# Patient Record
Sex: Male | Born: 1940 | Race: White | Hispanic: No | Marital: Married | Smoking: Former smoker
Health system: Southern US, Community
[De-identification: ages and names within clinical notes are randomized; demographics above are authoritative.]

## PROBLEM LIST (undated history)

## (undated) DIAGNOSIS — K5900 Constipation, unspecified: Secondary | ICD-10-CM

## (undated) DIAGNOSIS — C9110 Chronic lymphocytic leukemia of B-cell type not having achieved remission: Secondary | ICD-10-CM

## (undated) DIAGNOSIS — E119 Type 2 diabetes mellitus without complications: Secondary | ICD-10-CM

## (undated) DIAGNOSIS — I10 Essential (primary) hypertension: Secondary | ICD-10-CM

## (undated) DIAGNOSIS — K5790 Diverticulosis of intestine, part unspecified, without perforation or abscess without bleeding: Secondary | ICD-10-CM

## (undated) DIAGNOSIS — E785 Hyperlipidemia, unspecified: Secondary | ICD-10-CM

## (undated) DIAGNOSIS — I700 Atherosclerosis of aorta: Secondary | ICD-10-CM

## (undated) DIAGNOSIS — N200 Calculus of kidney: Secondary | ICD-10-CM

## (undated) HISTORY — PX: VASECTOMY: SHX75

## (undated) HISTORY — DX: Essential (primary) hypertension: I10

## (undated) HISTORY — PX: SHOULDER ARTHROSCOPY: SHX128

## (undated) HISTORY — DX: Calculus of kidney: N20.0

## (undated) HISTORY — DX: Constipation, unspecified: K59.00

## (undated) HISTORY — DX: Atherosclerosis of aorta: I70.0

## (undated) HISTORY — DX: Type 2 diabetes mellitus without complications: E11.9

## (undated) HISTORY — PX: FINGER FRACTURE SURGERY: SHX638

## (undated) HISTORY — DX: Hyperlipidemia, unspecified: E78.5

## (undated) HISTORY — DX: Diverticulosis of intestine, part unspecified, without perforation or abscess without bleeding: K57.90

## (undated) HISTORY — DX: Chronic lymphocytic leukemia of B-cell type not having achieved remission: C91.10

---

## 1997-12-29 ENCOUNTER — Encounter: Admission: RE | Admit: 1997-12-29 | Discharge: 1998-03-29 | Payer: Self-pay | Admitting: Internal Medicine

## 1999-10-02 ENCOUNTER — Emergency Department (HOSPITAL_COMMUNITY): Admission: EM | Admit: 1999-10-02 | Discharge: 1999-10-02 | Payer: Self-pay

## 2014-04-22 ENCOUNTER — Ambulatory Visit (INDEPENDENT_AMBULATORY_CARE_PROVIDER_SITE_OTHER): Payer: Medicare PPO

## 2014-04-22 ENCOUNTER — Ambulatory Visit (INDEPENDENT_AMBULATORY_CARE_PROVIDER_SITE_OTHER): Payer: Medicare PPO | Admitting: Family Medicine

## 2014-04-22 VITALS — BP 142/80 | HR 93 | Temp 98.0°F | Resp 17 | Ht 68.5 in | Wt 149.0 lb

## 2014-04-22 DIAGNOSIS — R200 Anesthesia of skin: Secondary | ICD-10-CM

## 2014-04-22 DIAGNOSIS — K5732 Diverticulitis of large intestine without perforation or abscess without bleeding: Secondary | ICD-10-CM

## 2014-04-22 DIAGNOSIS — E1165 Type 2 diabetes mellitus with hyperglycemia: Secondary | ICD-10-CM

## 2014-04-22 DIAGNOSIS — R109 Unspecified abdominal pain: Secondary | ICD-10-CM

## 2014-04-22 DIAGNOSIS — Z862 Personal history of diseases of the blood and blood-forming organs and certain disorders involving the immune mechanism: Secondary | ICD-10-CM

## 2014-04-22 DIAGNOSIS — K5900 Constipation, unspecified: Secondary | ICD-10-CM

## 2014-04-22 DIAGNOSIS — H538 Other visual disturbances: Secondary | ICD-10-CM

## 2014-04-22 DIAGNOSIS — R197 Diarrhea, unspecified: Secondary | ICD-10-CM

## 2014-04-22 DIAGNOSIS — Z8639 Personal history of other endocrine, nutritional and metabolic disease: Secondary | ICD-10-CM

## 2014-04-22 DIAGNOSIS — R209 Unspecified disturbances of skin sensation: Secondary | ICD-10-CM

## 2014-04-22 DIAGNOSIS — IMO0001 Reserved for inherently not codable concepts without codable children: Secondary | ICD-10-CM

## 2014-04-22 LAB — POCT URINALYSIS DIPSTICK
Bilirubin, UA: NEGATIVE
Blood, UA: NEGATIVE
Glucose, UA: 100
Ketones, UA: 15
Nitrite, UA: NEGATIVE
Protein, UA: NEGATIVE
Spec Grav, UA: <=1.005
Urobilinogen, UA: 0.2
pH, UA: 5.5

## 2014-04-22 LAB — COMPREHENSIVE METABOLIC PANEL
ALBUMIN: 5 g/dL (ref 3.5–5.2)
ALT: 25 U/L (ref 0–53)
AST: 20 U/L (ref 0–37)
Alkaline Phosphatase: 76 U/L (ref 39–117)
BUN: 14 mg/dL (ref 6–23)
CALCIUM: 9.8 mg/dL (ref 8.4–10.5)
CO2: 25 mEq/L (ref 19–32)
Chloride: 102 mEq/L (ref 96–112)
Creat: 0.96 mg/dL (ref 0.50–1.35)
GLUCOSE: 157 mg/dL — AB (ref 70–99)
POTASSIUM: 5.1 meq/L (ref 3.5–5.3)
Sodium: 140 mEq/L (ref 135–145)
Total Bilirubin: 1.1 mg/dL (ref 0.2–1.2)
Total Protein: 7.7 g/dL (ref 6.0–8.3)

## 2014-04-22 LAB — POCT CBC
Granulocyte percent: 66.3 %G (ref 37–80)
HCT, POC: 45.7 % (ref 43.5–53.7)
Hemoglobin: 14.9 g/dL (ref 14.1–18.1)
Lymph, poc: 3.6 — AB (ref 0.6–3.4)
MCH, POC: 28.5 pg (ref 27–31.2)
MCHC: 32.6 g/dL (ref 31.8–35.4)
MCV: 87.6 fL (ref 80–97)
MID (cbc): 0.7 (ref 0–0.9)
MPV: 7.1 fL (ref 0–99.8)
POC Granulocyte: 8.5 — AB (ref 2–6.9)
POC LYMPH PERCENT: 28.1 %L (ref 10–50)
POC MID %: 5.6 %M (ref 0–12)
Platelet Count, POC: 227 10*3/uL (ref 142–424)
RBC: 5.22 M/uL (ref 4.69–6.13)
RDW, POC: 14.5 %
WBC: 12.8 10*3/uL — AB (ref 4.6–10.2)

## 2014-04-22 LAB — POCT GLYCOSYLATED HEMOGLOBIN (HGB A1C): Hemoglobin A1C: 9.1

## 2014-04-22 LAB — POCT UA - MICROSCOPIC ONLY
Bacteria, U Microscopic: NEGATIVE
Casts, Ur, LPF, POC: NEGATIVE
Crystals, Ur, HPF, POC: NEGATIVE
Epithelial cells, urine per micros: NEGATIVE
Mucus, UA: NEGATIVE
RBC, urine, microscopic: NEGATIVE
Yeast, UA: NEGATIVE

## 2014-04-22 LAB — GLUCOSE, POCT (MANUAL RESULT ENTRY): POC Glucose: 155 mg/dl — AB (ref 70–99)

## 2014-04-22 IMAGING — CR DG ABDOMEN ACUTE W/ 1V CHEST
3 series · 3 of 3 positions shown · non-contrast
Comparison: None.

EXAM:
ACUTE ABDOMEN SERIES (ABDOMEN 2 VIEW & CHEST 1 VIEW)

[PA]
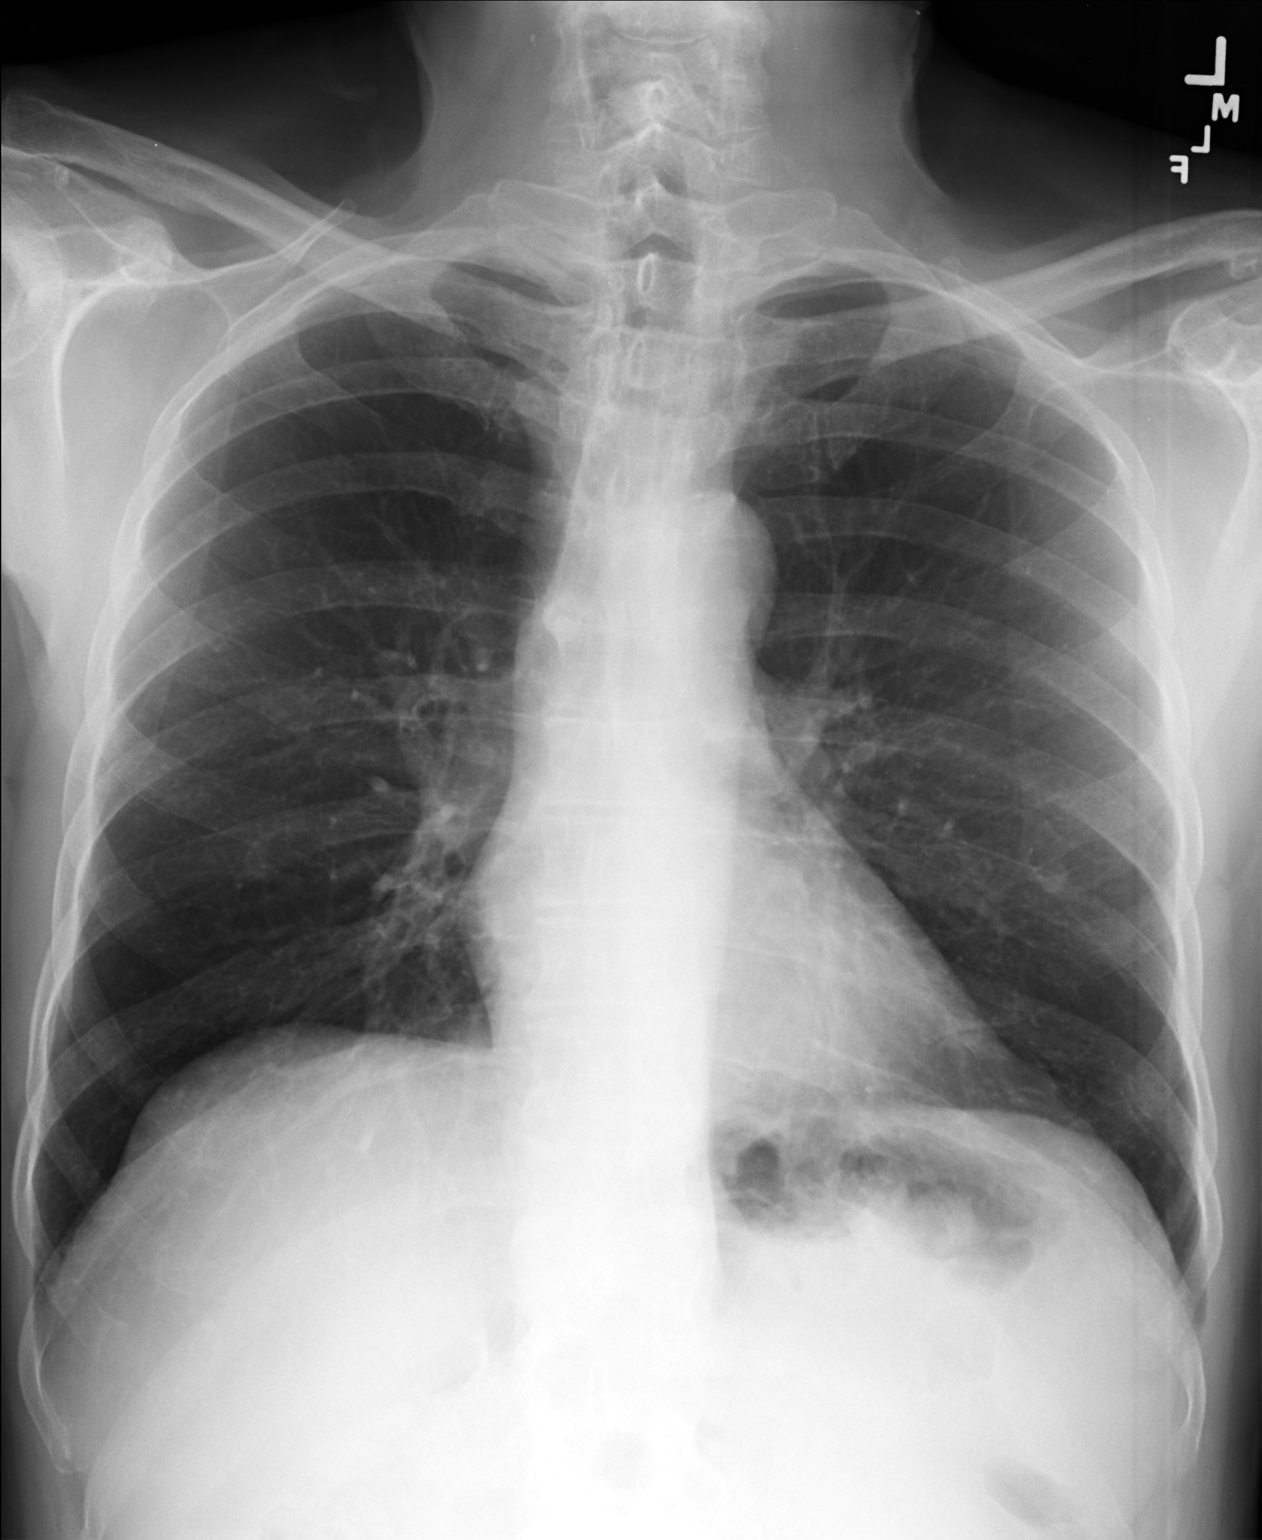

[AP (1 of 2)]
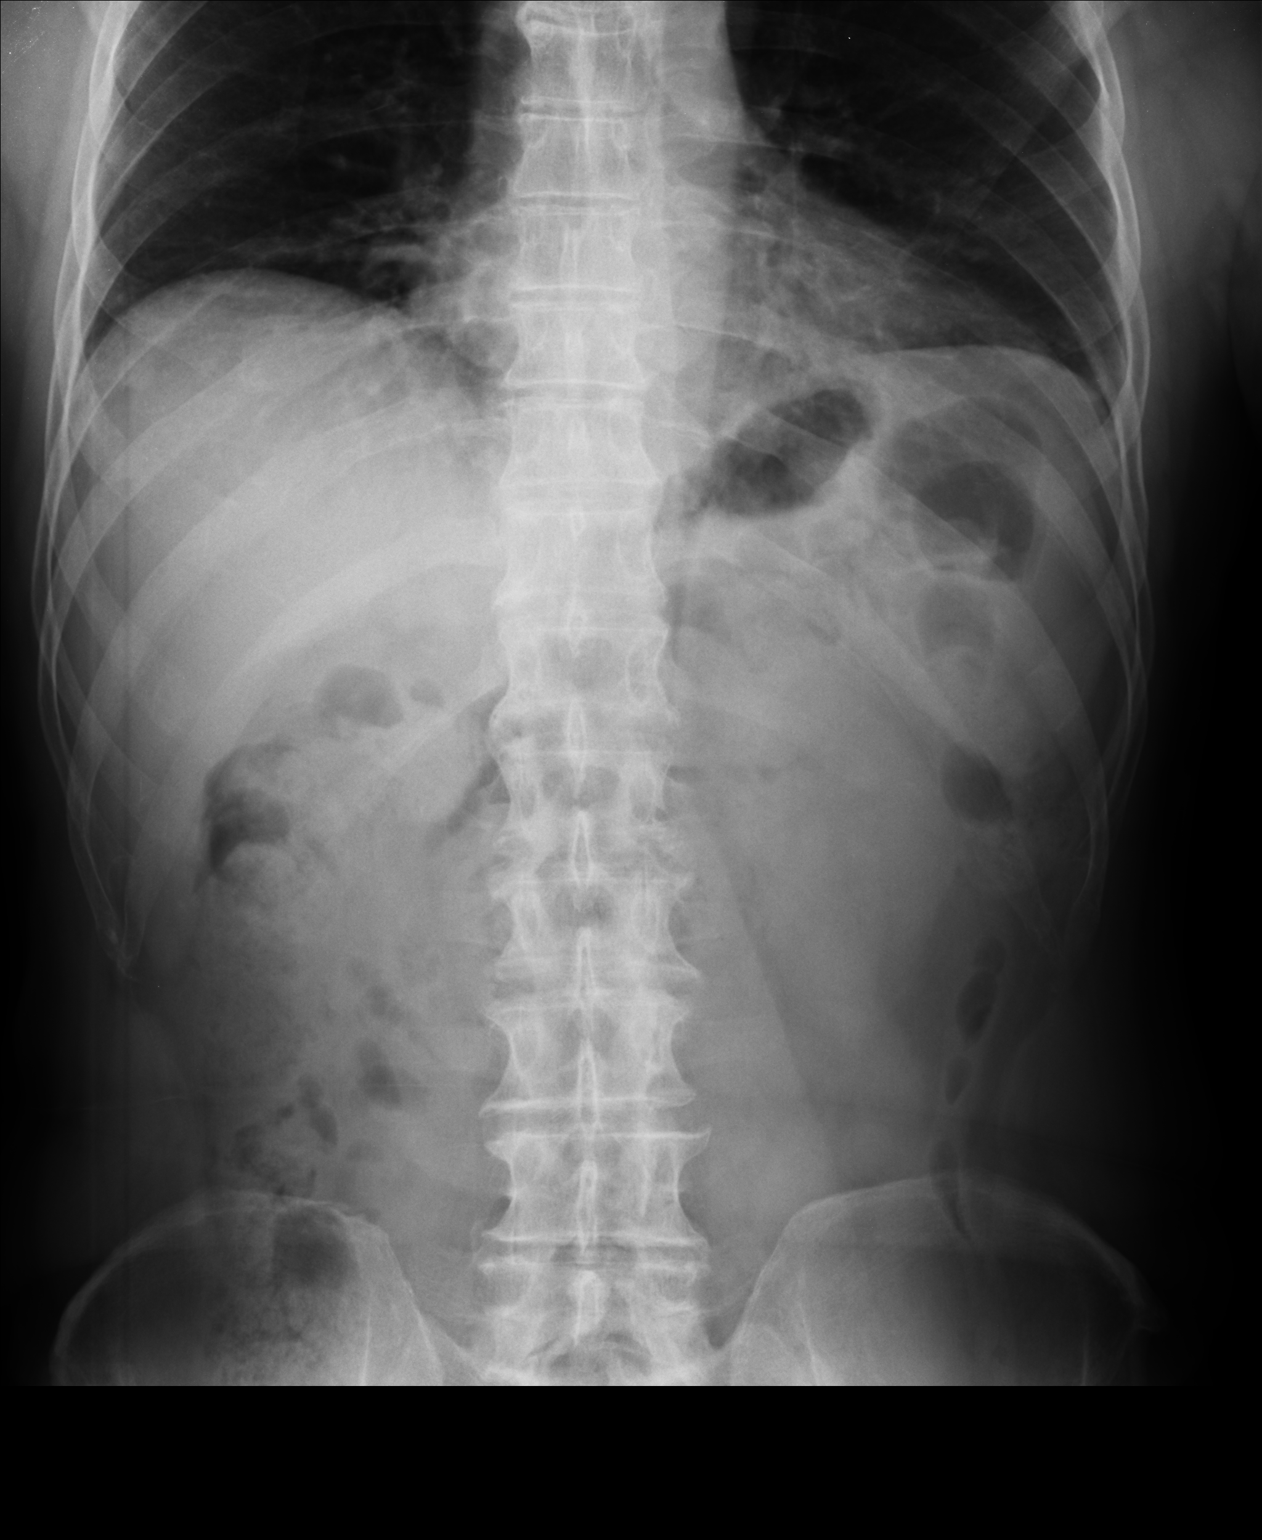

[AP (2 of 2)]
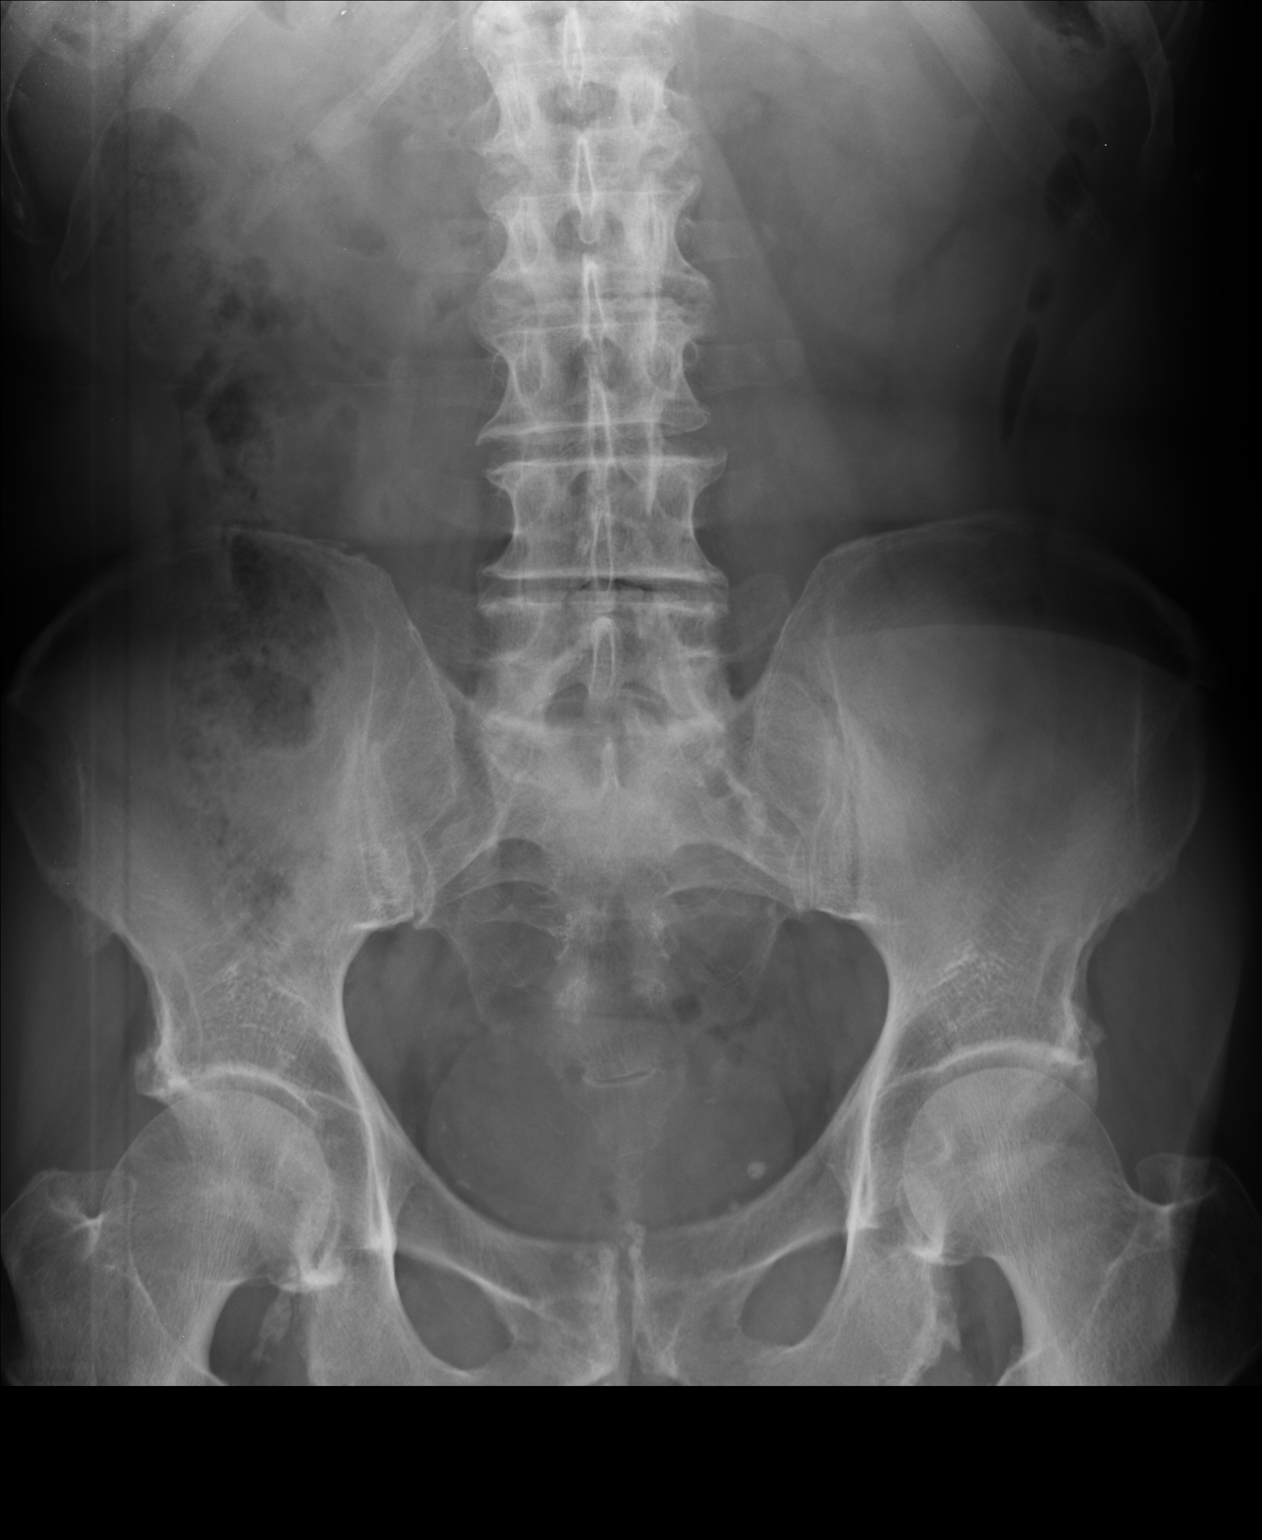

[3 of 3 positions shown; findings below may reference images not displayed]

FINDINGS: There is no evidence of dilated bowel loops or free intraperitoneal
air. No radiopaque calculi or other significant radiographic
abnormality is seen. Heart size and mediastinal contours are within
normal limits. Both lungs are clear.
IMPRESSION: Negative abdominal radiographs.  No acute cardiopulmonary disease.

## 2014-04-22 MED ORDER — BLOOD GLUCOSE METER KIT: PACK | Status: DC

## 2014-04-22 MED ORDER — METFORMIN HCL 500 MG PO TABS: ORAL_TABLET | ORAL | Status: DC

## 2014-04-22 MED ORDER — CIPROFLOXACIN HCL 500 MG PO TABS: 500.0000 mg | ORAL_TABLET | Freq: Two times a day (BID) | ORAL | Status: DC

## 2014-04-22 MED ORDER — METRONIDAZOLE 500 MG PO TABS: 500.0000 mg | ORAL_TABLET | Freq: Three times a day (TID) | ORAL | Status: DC

## 2014-04-22 NOTE — Progress Notes (Signed)
Subjective: 73 year old man who is here for his first time. He has not been to a physician for about 11 years. He has been remarkably healthy. He is a retired Advertising account executive person. He is married. He has been having pains for the past week and he is flanks and abdomen. He has not been vomiting but he has had diarrhea the last few days. It is doing a little bit better now. He continues to hurt and in the flanks however. He is not on any regular medications. Has not been taking any over-the-counter medications. He does not smoke, having quit 30 or 40 years ago. He does not drink alcohol. He is not as active as he was back when he was working. He has not had any significant weight loss. Has not seen any blood in his stool. It is been many years since he had a colonoscopy. He has not had a surgeries on the abdomen. He has had rotator cuff repairs and a finger surgery. He does complain of some tingling and numbness around his lips. He was told many years ago that he might have type 2 diabetes, was placed on some medication but his sugars went too low, and he has never had it rechecked. He has been having some variable blurring of his vision. He has cold feet but no neuropathy symptoms otherwise.  Objective: Healthy-appearing man in no obvious distress. Poor dentition, missing a lot of teeth. His throat is clear. Lips have mild dryness on the periphery of the lips but nothing major. Neck was supple without significant nodes. No thyromegaly. Chest is clear to auscultation. Heart regular without murmurs gallops or arrhythmias. No axillary nodes. Abdomen has normal bowel sounds. Has generalized tenderness across the whole abdomen. He complains of pain in his flanks but did not demonstrate significant CVA tenderness.  Assessment: Abdominal and flank pain Diarrhea, improving Remote history of hyperglycemia, possibly type 2 diabetes Lip numbness  Plan: Abdominal series, CBC,  urinalysis, glucose, hemoglobin A1c, hypertensive metabolic panel  [facility] reading (PRIMARY) by  Dr. Linna Darner Nonspecific abdomen with lots of stool present  Results for orders placed in visit on 04/22/14  POCT UA - MICROSCOPIC ONLY      Result Value Ref Range   WBC, Ur, HPF, POC 1-8     RBC, urine, microscopic neg     Bacteria, U Microscopic neg     Mucus, UA neg     Epithelial cells, urine per micros neg     Crystals, Ur, HPF, POC neg     Casts, Ur, LPF, POC neg     Yeast, UA neg    POCT URINALYSIS DIPSTICK      Result Value Ref Range   Color, UA yellow     Clarity, UA clear     Glucose, UA 100     Bilirubin, UA neg     Ketones, UA 15     Spec Grav, UA <=1.005     Blood, UA neg     pH, UA 5.5     Protein, UA neg     Urobilinogen, UA 0.2     Nitrite, UA neg     Leukocytes, UA Trace    POCT CBC      Result Value Ref Range   WBC 12.8 (*) 4.6 - 10.2 K/uL   Lymph, poc 3.6 (*) 0.6 - 3.4   POC LYMPH PERCENT 28.1  10 - 50 %L   MID (cbc) 0.7  0 -  0.9   POC MID % 5.6  0 - 12 %M   POC Granulocyte 8.5 (*) 2 - 6.9   Granulocyte percent 66.3  37 - 80 %G   RBC 5.22  4.69 - 6.13 M/uL   Hemoglobin 14.9  14.1 - 18.1 g/dL   HCT, POC 45.7  43.5 - 53.7 %   MCV 87.6  80 - 97 fL   MCH, POC 28.5  27 - 31.2 pg   MCHC 32.6  31.8 - 35.4 g/dL   RDW, POC 14.5     Platelet Count, POC 227  142 - 424 K/uL   MPV 7.1  0 - 99.8 fL  GLUCOSE, POCT (MANUAL RESULT ENTRY)      Result Value Ref Range   POC Glucose 155 (*) 70 - 99 mg/dl  POCT GLYCOSYLATED HEMOGLOBIN (HGB A1C)      Result Value Ref Range   Hemoglobin A1C 9.1     .

## 2014-04-22 NOTE — Patient Instructions (Signed)
Take MiraLax one dose daily to gently clean out your bowels. Initially you might try using an over-the-counter Dulcolax suppository one daily for 2 or 3 days also.  Take ciprofloxacin 500 mg one twice daily for possible diverticulitis. Also take metronidazole 500 mg 3 times daily for possible diverticulitis. Both antibiotics are used together to treat diverticulitis. The metronidazole may cause some nausea.  Take metformin 500 mg one daily for diabetes, and after one week increase to twice daily  Return at any time or go to the emergency room if worse, especially if increased pain, vomiting, passing blood, or fevers.  Return in 2 or 3 weeks to recheck the diabetes  The number at the geriatrics practice is: [place]:  [phone]

## 2014-04-24 ENCOUNTER — Encounter: Payer: Self-pay | Admitting: Family Medicine

## 2014-04-24 LAB — URINE CULTURE
Colony Count: NO GROWTH
ORGANISM ID, BACTERIA: NO GROWTH

## 2014-04-25 DIAGNOSIS — R109 Unspecified abdominal pain: Secondary | ICD-10-CM | POA: Diagnosis not present

## 2014-04-25 DIAGNOSIS — D72829 Elevated white blood cell count, unspecified: Secondary | ICD-10-CM | POA: Insufficient documentation

## 2014-04-25 DIAGNOSIS — Z79899 Other long term (current) drug therapy: Secondary | ICD-10-CM | POA: Diagnosis not present

## 2014-04-25 DIAGNOSIS — Z23 Encounter for immunization: Secondary | ICD-10-CM | POA: Insufficient documentation

## 2014-04-25 DIAGNOSIS — M5137 Other intervertebral disc degeneration, lumbosacral region: Secondary | ICD-10-CM | POA: Diagnosis not present

## 2014-04-25 DIAGNOSIS — M51379 Other intervertebral disc degeneration, lumbosacral region without mention of lumbar back pain or lower extremity pain: Secondary | ICD-10-CM | POA: Insufficient documentation

## 2014-04-25 DIAGNOSIS — Z91013 Allergy to seafood: Secondary | ICD-10-CM | POA: Diagnosis not present

## 2014-04-25 DIAGNOSIS — E119 Type 2 diabetes mellitus without complications: Secondary | ICD-10-CM | POA: Diagnosis not present

## 2014-04-25 DIAGNOSIS — K5900 Constipation, unspecified: Secondary | ICD-10-CM | POA: Diagnosis not present

## 2014-04-25 DIAGNOSIS — Z9852 Vasectomy status: Secondary | ICD-10-CM | POA: Insufficient documentation

## 2014-04-26 ENCOUNTER — Emergency Department (HOSPITAL_COMMUNITY): Payer: Medicare HMO

## 2014-04-26 ENCOUNTER — Encounter (HOSPITAL_COMMUNITY): Payer: Self-pay | Admitting: Emergency Medicine

## 2014-04-26 ENCOUNTER — Observation Stay (HOSPITAL_COMMUNITY)
Admission: EM | Admit: 2014-04-26 | Discharge: 2014-04-27 | Disposition: A | Discharge status: Home / Self Care | Payer: Medicare HMO | Attending: Surgery | Admitting: Surgery

## 2014-04-26 DIAGNOSIS — C9110 Chronic lymphocytic leukemia of B-cell type not having achieved remission: Secondary | ICD-10-CM | POA: Diagnosis present

## 2014-04-26 DIAGNOSIS — K5900 Constipation, unspecified: Secondary | ICD-10-CM | POA: Diagnosis present

## 2014-04-26 DIAGNOSIS — R1030 Lower abdominal pain, unspecified: Secondary | ICD-10-CM

## 2014-04-26 DIAGNOSIS — R109 Unspecified abdominal pain: Secondary | ICD-10-CM

## 2014-04-26 LAB — COMPREHENSIVE METABOLIC PANEL
ALBUMIN: 4.5 g/dL (ref 3.5–5.2)
ALT: 26 U/L (ref 0–53)
ANION GAP: 17 — AB (ref 5–15)
AST: 33 U/L (ref 0–37)
Alkaline Phosphatase: 72 U/L (ref 39–117)
BUN: 16 mg/dL (ref 6–23)
CALCIUM: 9.5 mg/dL (ref 8.4–10.5)
CO2: 20 mEq/L (ref 19–32)
CREATININE: 0.96 mg/dL (ref 0.50–1.35)
Chloride: 103 mEq/L (ref 96–112)
GFR calc Af Amer: >90 mL/min (ref >90)
GFR calc non Af Amer: 81 mL/min — ABNORMAL LOW (ref >90)
GLUCOSE: 151 mg/dL — AB (ref 70–99)
Potassium: 3.7 mEq/L (ref 3.7–5.3)
Sodium: 140 mEq/L (ref 137–147)
TOTAL PROTEIN: 7.4 g/dL (ref 6.0–8.3)
Total Bilirubin: 0.9 mg/dL (ref 0.3–1.2)

## 2014-04-26 LAB — CBC WITH DIFFERENTIAL/PLATELET
BASOS PCT: 0 % (ref 0–1)
Basophils Absolute: 0 10*3/uL (ref 0.0–0.1)
EOS ABS: 0.3 10*3/uL (ref 0.0–0.7)
EOS PCT: 3 % (ref 0–5)
HCT: 40 % (ref 39.0–52.0)
HEMOGLOBIN: 13.8 g/dL (ref 13.0–17.0)
LYMPHS ABS: 5.3 10*3/uL — AB (ref 0.7–4.0)
Lymphocytes Relative: 47 % — ABNORMAL HIGH (ref 12–46)
MCH: 29.2 pg (ref 26.0–34.0)
MCHC: 34.5 g/dL (ref 30.0–36.0)
MCV: 84.7 fL (ref 78.0–100.0)
MONOS PCT: 7 % (ref 3–12)
Monocytes Absolute: 0.8 10*3/uL (ref 0.1–1.0)
NEUTROS PCT: 43 % (ref 43–77)
Neutro Abs: 4.8 10*3/uL (ref 1.7–7.7)
Platelets: 212 10*3/uL (ref 150–400)
RBC: 4.72 MIL/uL (ref 4.22–5.81)
RDW: 13.7 % (ref 11.5–15.5)
WBC: 11.3 10*3/uL — ABNORMAL HIGH (ref 4.0–10.5)

## 2014-04-26 LAB — URINALYSIS, ROUTINE W REFLEX MICROSCOPIC
BILIRUBIN URINE: NEGATIVE
Glucose, UA: NEGATIVE mg/dL
Hgb urine dipstick: NEGATIVE
Ketones, ur: 15 mg/dL — AB
LEUKOCYTES UA: NEGATIVE
NITRITE: NEGATIVE
PH: 5 (ref 5.0–8.0)
Protein, ur: NEGATIVE mg/dL
Specific Gravity, Urine: 1.01 (ref 1.005–1.030)
UROBILINOGEN UA: 0.2 mg/dL (ref 0.0–1.0)

## 2014-04-26 LAB — GLUCOSE, CAPILLARY
Glucose-Capillary: 108 mg/dL — ABNORMAL HIGH (ref 70–99)
Glucose-Capillary: 90 mg/dL (ref 70–99)
Glucose-Capillary: 95 mg/dL (ref 70–99)

## 2014-04-26 IMAGING — CT CT ABD-PELV W/ CM
1 of 5 series · 8 of 46 positions shown, 9 images · IV contrast (omnipaque)
Comparison: None.

CLINICAL DATA: Abdominal pain.

EXAM:
CT ABDOMEN AND PELVIS WITH CONTRAST
TECHNIQUE: Multidetector CT imaging of the abdomen and pelvis was performed
using the standard protocol following bolus administration of
intravenous contrast.
CONTRAST:  100mL OMNIPAQUE IOHEXOL 300 MG/ML  SOLN

[Series 206: coronals 1 · coronal · 0.50mm/px · 8 of 101 slices shown, 9 images]
[im 12/101  soft-tissue]
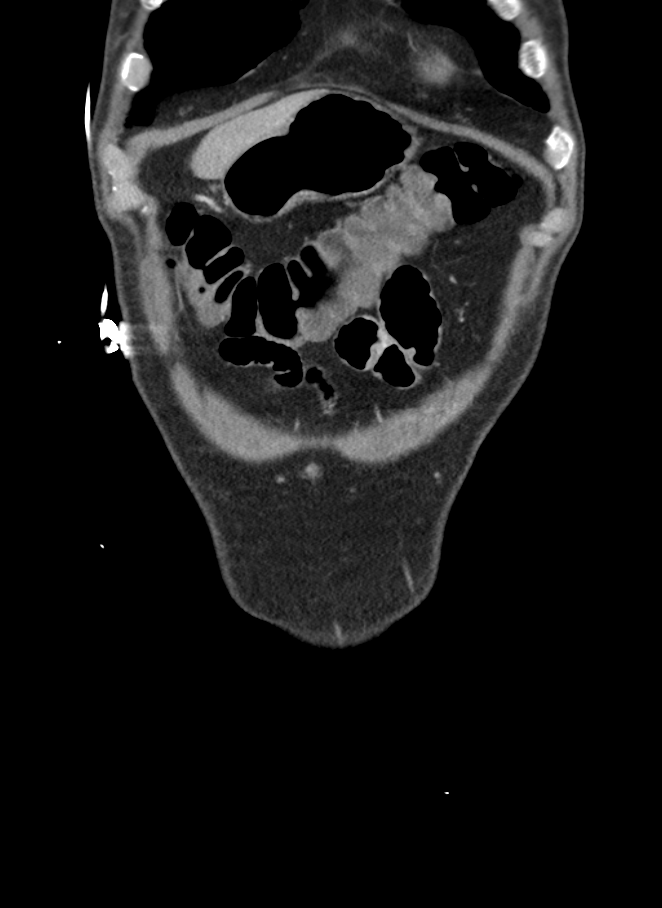
[im 12/101  bone]
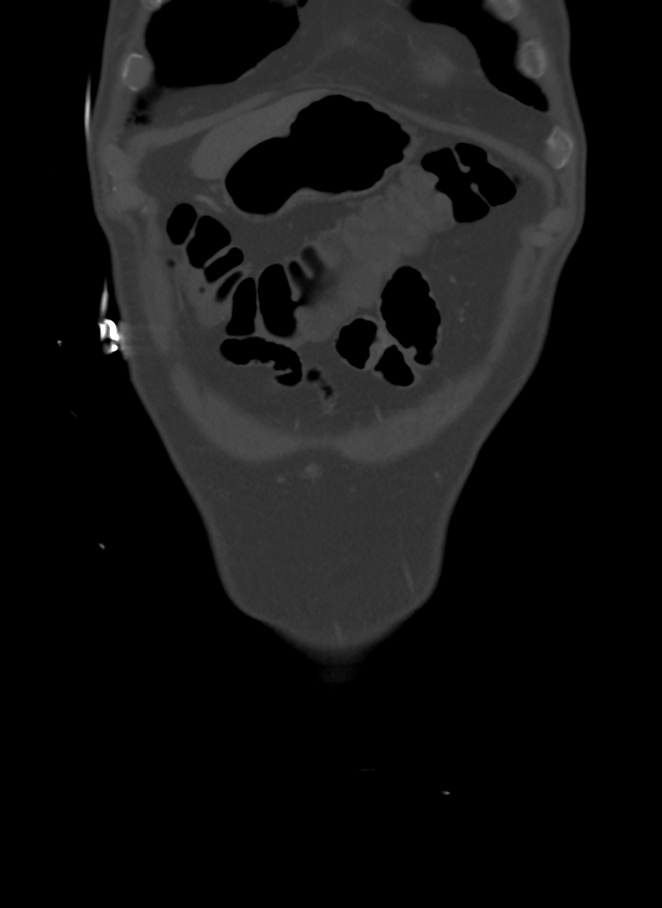
[im 23/101  soft-tissue]
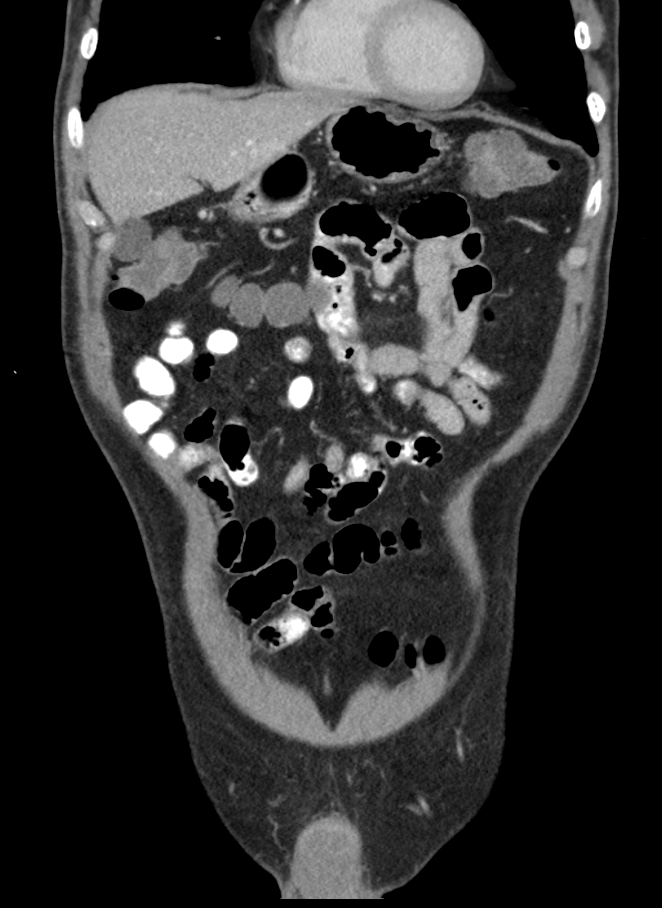
[im 34/101  soft-tissue]
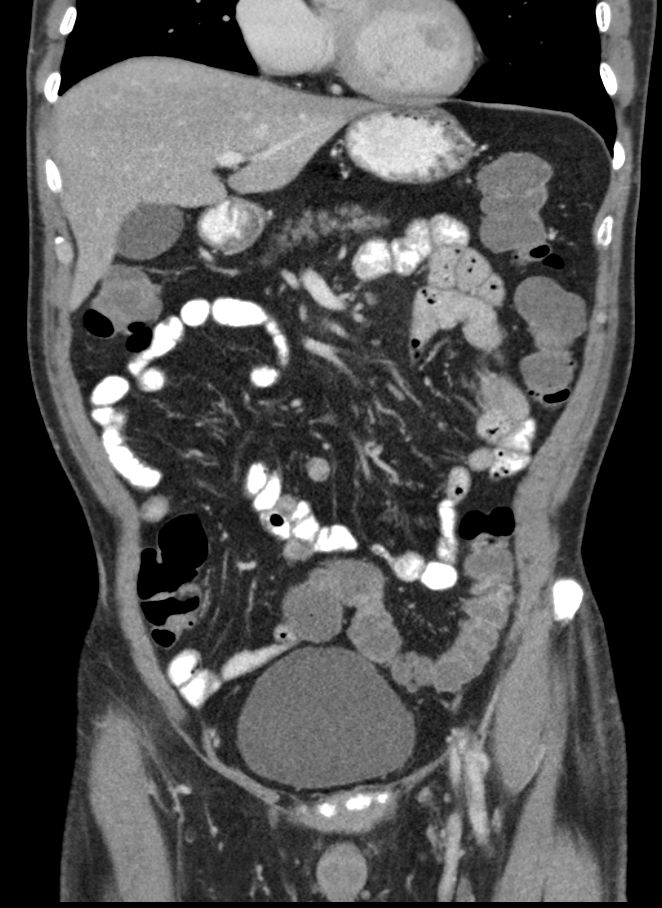
[im 45/101  soft-tissue]
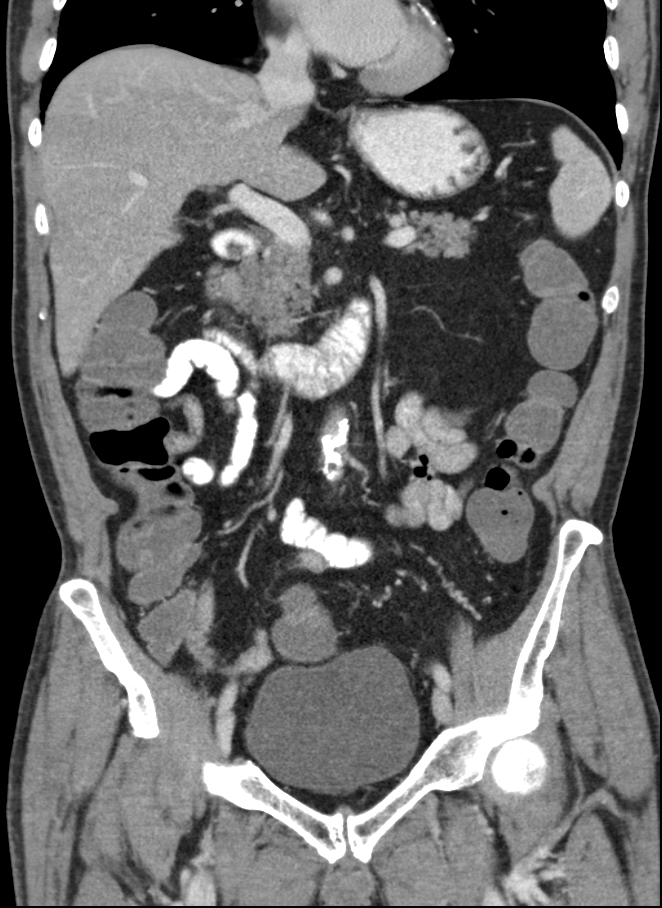
[im 56/101  soft-tissue]
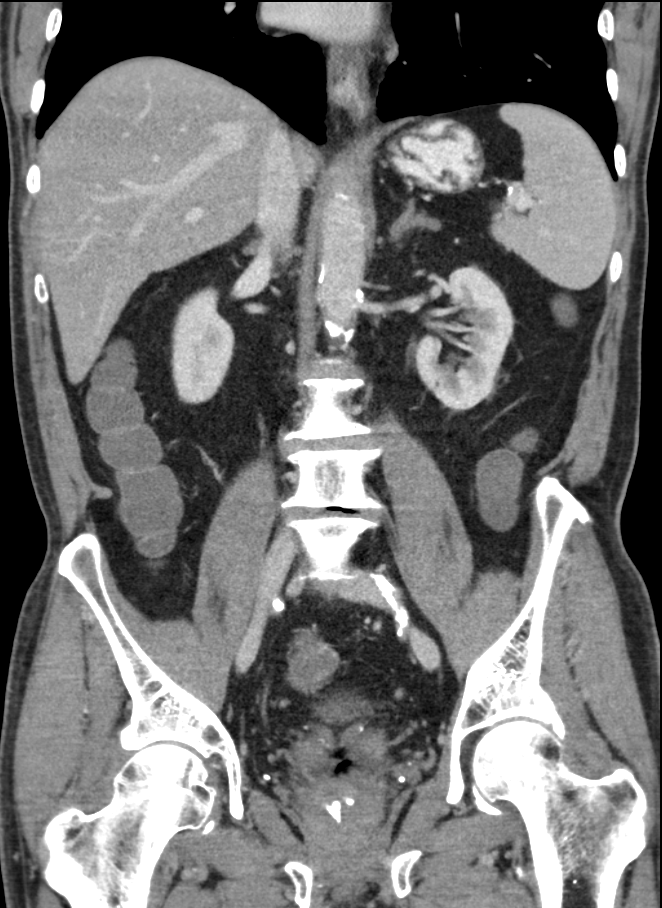
[im 67/101  soft-tissue]
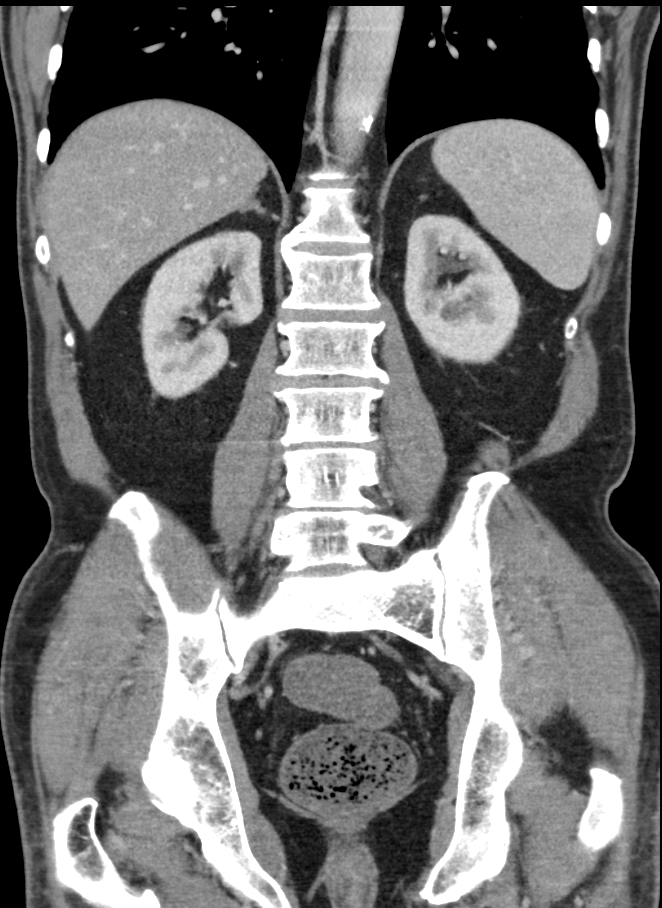
[im 78/101  soft-tissue]
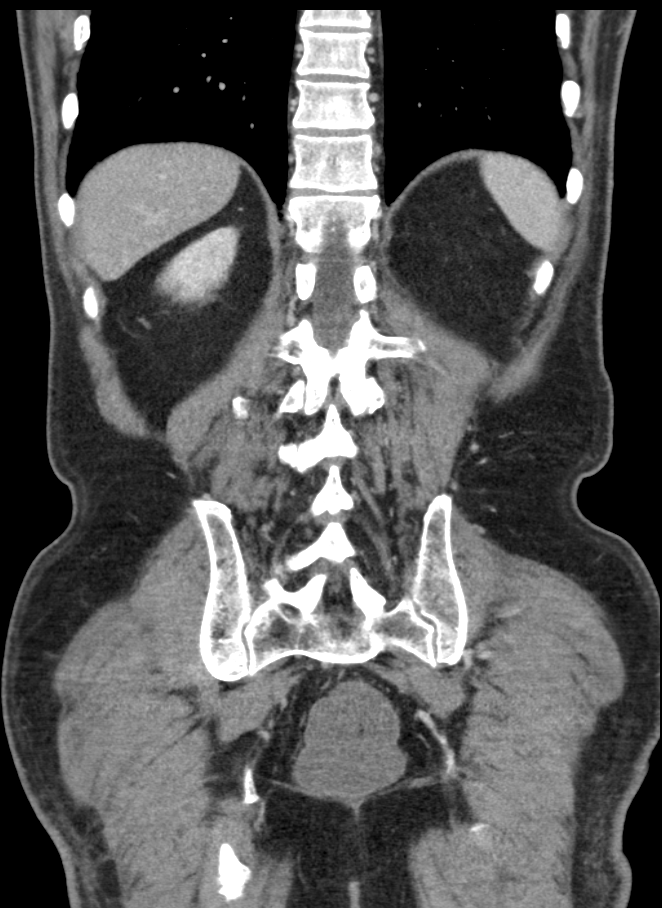
[im 89/101  soft-tissue]
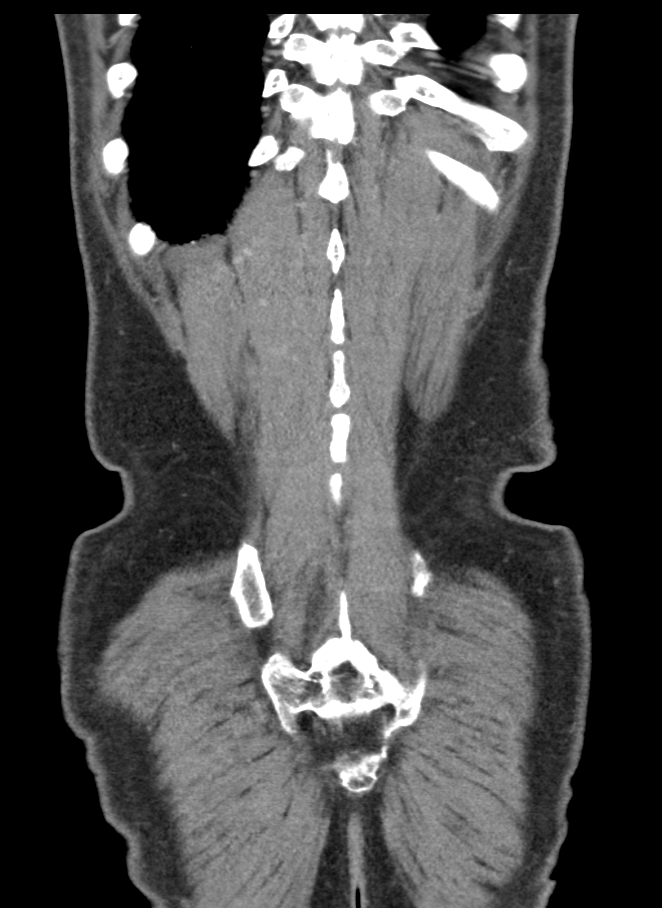

[8 of 46 positions shown; findings below may reference images not displayed]

FINDINGS: Moderate degenerative disc disease is noted at L2-3 and L4-5.
Visualized lung bases appear normal.

No gallstones are noted. No focal abnormality is noted in the liver,
spleen or pancreas. Adrenal glands and kidneys appear normal. No
hydronephrosis or renal obstruction is noted. No renal or ureteral
calculi are noted. Atherosclerotic calcifications of the abdominal
aorta are noted without aneurysm formation. There is no evidence of
bowel obstruction. No abnormal fluid collection is noted urinary
bladder appears normal. Stool is noted in the rectum. No significant
adenopathy is noted. The appendix is mildly thickened measuring 8
mm, but no surrounding inflammation is noted.
IMPRESSION: The appendix is mildly dilated at 8 mm, but no surrounding
inflammation is noted. These findings are equivocal for early
appendicitis, and correlation with patient's clinical symptoms and
laboratory data is recommended. Critical Value/emergent results were
called by telephone at the time of interpretation on [DATE] at
[DATE] to Dr. ARMAGEDON, who verbally acknowledged these
results.

## 2014-04-26 MED ORDER — CHLORHEXIDINE GLUCONATE 0.12 % MT SOLN
15.0000 mL | Freq: Two times a day (BID) | OROMUCOSAL | Status: DC
  Administered 2014-04-26 – 2014-04-27 (×2): 15 mL via OROMUCOSAL
  Filled 2014-04-26 (×2): qty 15

## 2014-04-26 MED ORDER — PANTOPRAZOLE SODIUM 40 MG IV SOLR
40.0000 mg | Freq: Every day | INTRAVENOUS | Status: DC
  Administered 2014-04-26: 40 mg via INTRAVENOUS
  Filled 2014-04-26 (×2): qty 40

## 2014-04-26 MED ORDER — INSULIN ASPART 100 UNIT/ML ~~LOC~~ SOLN
0.0000 [IU] | SUBCUTANEOUS | Status: DC
Start: 2014-04-26 — End: 2014-04-27

## 2014-04-26 MED ORDER — ONDANSETRON HCL 4 MG/2ML IJ SOLN
4.0000 mg | Freq: Once | INTRAMUSCULAR | Status: AC
  Administered 2014-04-26: 4 mg via INTRAVENOUS
  Filled 2014-04-26: qty 2

## 2014-04-26 MED ORDER — PNEUMOCOCCAL VAC POLYVALENT 25 MCG/0.5ML IJ INJ
0.5000 mL | INJECTION | INTRAMUSCULAR | Status: AC
  Administered 2014-04-27: 0.5 mL via INTRAMUSCULAR
  Filled 2014-04-26 (×2): qty 0.5

## 2014-04-26 MED ORDER — IOHEXOL 300 MG/ML  SOLN
25.0000 mL | Freq: Once | INTRAMUSCULAR | Status: AC | PRN
  Administered 2014-04-26: 25 mL via ORAL

## 2014-04-26 MED ORDER — MORPHINE SULFATE 2 MG/ML IJ SOLN: 1.0000 mg | INTRAMUSCULAR | Status: DC | PRN

## 2014-04-26 MED ORDER — MORPHINE SULFATE 4 MG/ML IJ SOLN
4.0000 mg | Freq: Once | INTRAMUSCULAR | Status: AC
Start: 2014-04-26 — End: 2014-04-26
  Administered 2014-04-26: 4 mg via INTRAVENOUS
  Filled 2014-04-26: qty 1

## 2014-04-26 MED ORDER — ONDANSETRON HCL 4 MG/2ML IJ SOLN
4.0000 mg | Freq: Four times a day (QID) | INTRAMUSCULAR | Status: DC | PRN
Start: 2014-04-26 — End: 2014-04-27

## 2014-04-26 MED ORDER — CETYLPYRIDINIUM CHLORIDE 0.05 % MT LIQD: 7.0000 mL | Freq: Two times a day (BID) | OROMUCOSAL | Status: DC

## 2014-04-26 MED ORDER — IOHEXOL 300 MG/ML  SOLN
100.0000 mL | Freq: Once | INTRAMUSCULAR | Status: AC | PRN
  Administered 2014-04-26: 100 mL via INTRAVENOUS

## 2014-04-26 MED ORDER — POTASSIUM CHLORIDE IN NACL 20-0.9 MEQ/L-% IV SOLN
INTRAVENOUS | Status: DC
  Administered 2014-04-26 – 2014-04-27 (×2): via INTRAVENOUS
  Filled 2014-04-26 (×3): qty 1000

## 2014-04-26 MED ORDER — ENOXAPARIN SODIUM 40 MG/0.4ML ~~LOC~~ SOLN
40.0000 mg | SUBCUTANEOUS | Status: DC
  Administered 2014-04-26: 40 mg via SUBCUTANEOUS
  Filled 2014-04-26 (×2): qty 0.4

## 2014-04-26 MED ORDER — FLEET ENEMA 7-19 GM/118ML RE ENEM
1.0000 | ENEMA | Freq: Once | RECTAL | Status: AC
  Administered 2014-04-26: 1 via RECTAL
  Filled 2014-04-26: qty 1

## 2014-04-26 NOTE — H&P (Signed)
Tony Banks is an 73 y.o. male.   Chief Complaint: crampy abdominal pain HPI: 73 year old Caucasian male who developed bilateral flank pain on Tuesday. He went to urgent care and was seen. This is his first doctor visit in about 11 years. When palpated he had tenderness in his suprapubic region as well. He states that the physician did some plain x-rays and he was diagnosed with diverticulitis and sent home on Cipro and Flagyl. He states the abdominal pain was mainly in her minute. It was sometimes come on after eating. It was also associated with constipation. His last bowel movement was over a week ago. He normally has a bowel movement at least every other day if not daily. He reports his last flatus was probably yesterday. He started his antibiotics of Cipro and Flagyl on Wednesday. He has also been trying laxatives as well as suppositories without any results to have a bowel movement. He has been burping and belching some. He denies any significant weight loss. He denies any melena, hematochezia. He has had some burping and belching. He doesn't really feel distended. He has been able to eat throughout this entire process. He reports normal appetite. He denies any prior abdominal surgery. His last colonoscopy was over 15 years ago and it was an incomplete colonoscopy due to poor bowel prep he states.  Past Medical History  Diagnosis Date  . Diabetes mellitus without complication     Past Surgical History  Procedure Laterality Date  . Fracture surgery    . Vasectomy      Family History  Problem Relation Age of Onset  . Heart disease Mother   . Stroke Mother   . Cancer Father   . Stroke Maternal Grandfather    Social History:  reports that he has never smoked. He does not have any smokeless tobacco history on file. He reports that he does not drink alcohol or use illicit drugs.  Allergies:  Allergies  Allergen Reactions  . Shellfish Allergy Other (See Comments)    Leg swelling       (Not in a hospital admission)  Results for orders placed during the hospital encounter of 04/26/14 (from the past 48 hour(s))  CBC WITH DIFFERENTIAL     Status: Abnormal   Collection Time    04/26/14 12:28 AM      Result Value Ref Range   WBC 11.3 (*) 4.0 - 10.5 K/uL   RBC 4.72  4.22 - 5.81 MIL/uL   Hemoglobin 13.8  13.0 - 17.0 g/dL   HCT 12.0  55.8 - 79.7 %   MCV 84.7  78.0 - 100.0 fL   MCH 29.2  26.0 - 34.0 pg   MCHC 34.5  30.0 - 36.0 g/dL   RDW 84.9  14.7 - 60.7 %   Platelets 212  150 - 400 K/uL   Neutrophils Relative % 43  43 - 77 %   Neutro Abs 4.8  1.7 - 7.7 K/uL   Lymphocytes Relative 47 (*) 12 - 46 %   Lymphs Abs 5.3 (*) 0.7 - 4.0 K/uL   Monocytes Relative 7  3 - 12 %   Monocytes Absolute 0.8  0.1 - 1.0 K/uL   Eosinophils Relative 3  0 - 5 %   Eosinophils Absolute 0.3  0.0 - 0.7 K/uL   Basophils Relative 0  0 - 1 %   Basophils Absolute 0.0  0.0 - 0.1 K/uL  COMPREHENSIVE METABOLIC PANEL     Status: Abnormal  Collection Time    04/26/14 12:28 AM      Result Value Ref Range   Sodium 140  137 - 147 mEq/L   Potassium 3.7  3.7 - 5.3 mEq/L   Chloride 103  96 - 112 mEq/L   CO2 20  19 - 32 mEq/L   Glucose, Bld 151 (*) 70 - 99 mg/dL   BUN 16  6 - 23 mg/dL   Creatinine, Ser 0.96  0.50 - 1.35 mg/dL   Calcium 9.5  8.4 - 10.5 mg/dL   Total Protein 7.4  6.0 - 8.3 g/dL   Albumin 4.5  3.5 - 5.2 g/dL   AST 33  0 - 37 U/L   ALT 26  0 - 53 U/L   Alkaline Phosphatase 72  39 - 117 U/L   Total Bilirubin 0.9  0.3 - 1.2 mg/dL   GFR calc non Af Amer 81 (*) >90 mL/min   GFR calc Af Amer >90  >90 mL/min   Comment: (NOTE)     The eGFR has been calculated using the CKD EPI equation.     This calculation has not been validated in all clinical situations.     eGFR's persistently <90 mL/min signify possible Chronic Kidney     Disease.   Anion gap 17 (*) 5 - 15   Ct Abdomen Pelvis W Contrast  04/26/2014   CLINICAL DATA:  Abdominal pain.  EXAM: CT ABDOMEN AND PELVIS WITH CONTRAST   TECHNIQUE: Multidetector CT imaging of the abdomen and pelvis was performed using the standard protocol following bolus administration of intravenous contrast.  CONTRAST:  121mL OMNIPAQUE IOHEXOL 300 MG/ML  SOLN  COMPARISON:  None.  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 Visualized lung bases appear normal.  No gallstones are noted. No focal abnormality is noted in the liver, spleen or pancreas. Adrenal glands and kidneys appear normal. No hydronephrosis or renal obstruction is noted. No renal or ureteral calculi are noted. Atherosclerotic calcifications of the abdominal aorta are noted without aneurysm formation. There is no evidence of bowel obstruction. No abnormal fluid collection is noted urinary bladder appears normal. Stool is noted in the rectum. No significant adenopathy is noted. The appendix is mildly thickened measuring 8 mm, but no surrounding inflammation is noted.  IMPRESSION: The appendix is mildly dilated at 8 mm, but no surrounding inflammation is noted. These findings are equivocal for early appendicitis, and correlation with patient's clinical symptoms and laboratory data is recommended. Critical Value/emergent results were called by telephone at the time of interpretation on 04/26/2014 at 8:11 am to Dr. Serita Grit, who verbally acknowledged these results.   Electronically Signed   By: Sabino Dick M.D.   On: 04/26/2014 08:12    Review of Systems  Constitutional: Negative for weight loss.  HENT: Negative for nosebleeds.   Eyes: Negative for blurred vision.  Respiratory: Negative for shortness of breath.   Cardiovascular: Negative for chest pain, palpitations, orthopnea and PND.       Denies DOE  Gastrointestinal: Positive for abdominal pain and constipation. Negative for nausea, vomiting, diarrhea, blood in stool and melena.  Genitourinary: Negative for dysuria, urgency and hematuria.  Musculoskeletal: Negative.   Skin: Negative for itching and rash.   Neurological: Negative for dizziness, focal weakness, seizures, loss of consciousness and headaches.       Denies TIAs, amaurosis fugax  Endo/Heme/Allergies: Does not bruise/bleed easily.  Psychiatric/Behavioral: The patient is not nervous/anxious.     Blood pressure  147/68, pulse 82, temperature 97.9 F (36.6 C), temperature source Oral, resp. rate 16, SpO2 98.00%. Physical Exam  Vitals reviewed. Constitutional: He is oriented to person, place, and time. He appears well-developed and well-nourished. No distress.  Laughing, smiling, rolling around on stretcher without pain  HENT:  Head: Normocephalic and atraumatic.  Right Ear: External ear normal.  Left Ear: External ear normal.  Eyes: Conjunctivae are normal. No scleral icterus.  Neck: Normal range of motion. Neck supple. No tracheal deviation present. No thyromegaly present.  Cardiovascular: Normal rate and normal heart sounds.   Respiratory: Effort normal and breath sounds normal. No stridor. No respiratory distress. He has no wheezes.  GI: Soft. There is tenderness. There is no rebound and no guarding.    Soft, perhaps some mild distension. TTP mainly L side, suprapubic and less so in RLQ/RUQ. No peritonitis/RT. No masses on rectal. Grossly non-bloody stool  Musculoskeletal: He exhibits no edema and no tenderness.  Lymphadenopathy:    He has no cervical adenopathy.  Neurological: He is alert and oriented to person, place, and time. He exhibits normal muscle tone.  Skin: Skin is warm and dry. No rash noted. He is not diaphoretic. No erythema. No pallor.  Psychiatric: He has a normal mood and affect. His behavior is normal. Judgment and thought content normal.     Assessment/Plan crampy abdominal pain Constipation Leukocytosis Diabetes mellitus Borderline radiological appearance of appendix on CT  His clinical exam and history are not classic for acute appendicitis. However it is marginally dilated at 8 mm. However there  is no periappendiceal stranding or inflammation. It is unclear whether or not his oral antibiotics that he has been on has been keeping the appendix at [place]. He certainly does not appear ill appearing. He is nontoxic. He has no tachycardia. He is afebrile. He does have quite a large stool burden in his upper rectum. We discussed several options. My recommendation was to admit him for enemas. As well as to observe him. I recommended not giving him antibiotics and seeing how his white blood cell count and his vital signs react. If his abdominal pain resolved and his leukocytosis normalized and  we are able to clean him out then I think he can go home tomorrow afternoon. I did explain that if he spikes a temperature, his abd pain persists, or if his white count continues to increase we may have to reevaluate the plan and consider appendectomy.   Leighton Ruff. Redmond Pulling, MD, FACS General, Bariatric, & Minimally Invasive Surgery [facility], [place]   [facility] M 04/26/2014, 11:03 AM

## 2014-04-26 NOTE — ED Notes (Signed)
Presents with lower abdominal pain-he was seen at a med center Tuesday, told he was full of stool and has diverticulitis-given laxatives and stool softeners and has not had relief. Last BM one week ago.  Pain is described as tightness at lower abdomen. Reports nausea.

## 2014-04-26 NOTE — ED Provider Notes (Signed)
9:42 AM Care assumed from Dr. Sharol Given.  CT shows questionable appendicitis.  Pt feeling better and looks well in general.  However, still with significant lower abdominal tenderness.  Discussed with Dr. Redmond Pulling (Surgery) who will eval.    Surgery admitted for observation.  Clinical Impression: 1. Lower abdominal pain       Tony Siren III, MD 04/27/14 [phone]

## 2014-04-26 NOTE — Progress Notes (Addendum)
Administered a Fleet enema no solid stool returned. Administered a soap suds enema held for 30 minutes no solid stool returned. After another 30 minutes there was more liquid stool returned, still no solid.  Penni Bombard, RN 6:06 PM 04/26/2014

## 2014-04-26 NOTE — ED Provider Notes (Signed)
CSN: [id number]     Arrival date & time 04/25/14  2357 History   First MD Initiated Contact with Patient 04/26/14 0534     Chief Complaint  Patient presents with  . Abdominal Pain     (Consider location/radiation/quality/duration/timing/severity/associated sxs/prior Treatment) HPI 73 yo male presents to the ER from home with complaint of abdominal pain.  Pt reports pain throughout the week, dull and mainly lower and on left side.  No n/v/d, but feels bloated after eating.  Was seen at urgent care, had labs and xray.  Was told he was constipated and may have diverticulitis.  Pt was started on cipro/flagyl.  He has been unable to have a BM in a week.  Pain is worsening. No PCP Past Medical History  Diagnosis Date  . Diabetes mellitus without complication    Past Surgical History  Procedure Laterality Date  . Fracture surgery    . Vasectomy     Family History  Problem Relation Age of Onset  . Heart disease Mother   . Stroke Mother   . Cancer Father   . Stroke Maternal Grandfather    History  Substance Use Topics  . Smoking status: Never Smoker   . Smokeless tobacco: Not on file  . Alcohol Use: No    Review of Systems  All other systems reviewed and are negative.     Allergies  Shellfish allergy  Home Medications   Prior to Admission medications   Medication Sig Start Date End Date Taking? Authorizing Provider  ciprofloxacin (CIPRO) 500 MG tablet Take 1 tablet (500 mg total) by mouth 2 (two) times daily. 04/22/14  Yes Posey Boyer, MD  metFORMIN (GLUCOPHAGE) 500 MG tablet Take one daily before evening meal. After one week increase to twice daily breakfast and supper for diabetes 04/22/14  Yes Posey Boyer, MD  metroNIDAZOLE (FLAGYL) 500 MG tablet Take 1 tablet (500 mg total) by mouth 3 (three) times daily. 04/22/14  Yes Posey Boyer, MD   BP 153/84  Pulse 103  Temp(Src) 97.9 F (36.6 C) (Oral)  Resp 22  SpO2 96% Physical Exam  Nursing note and vitals  reviewed. Constitutional: He is oriented to person, place, and time. He appears well-developed and well-nourished.  HENT:  Head: Normocephalic and atraumatic.  Nose: Nose normal.  Mouth/Throat: Oropharynx is clear and moist.  Eyes: Conjunctivae and EOM are normal. Pupils are equal, round, and reactive to light.  Neck: Normal range of motion. Neck supple. No JVD present. No tracheal deviation present. No thyromegaly present.  Cardiovascular: Normal rate, regular rhythm, normal heart sounds and intact distal pulses.  Exam reveals no gallop and no friction rub.   No murmur heard. Pulmonary/Chest: Effort normal and breath sounds normal. No stridor. No respiratory distress. He has no wheezes. He has no rales. He exhibits no tenderness.  Abdominal: Soft. Bowel sounds are normal. He exhibits no distension and no mass. There is tenderness (diffuse, worse suprapubic and LLQ). There is no rebound and no guarding.  Musculoskeletal: Normal range of motion. He exhibits no edema and no tenderness.  Lymphadenopathy:    He has no cervical adenopathy.  Neurological: He is alert and oriented to person, place, and time. He exhibits normal muscle tone. Coordination normal.  Skin: Skin is warm and dry. No rash noted. No erythema. No pallor.  Psychiatric: He has a normal mood and affect. His behavior is normal. Judgment and thought content normal.    ED Course  Procedures (including critical care  time) Labs Review Labs Reviewed  CBC WITH DIFFERENTIAL - Abnormal; Notable for the following:    WBC 11.3 (*)    Lymphocytes Relative 47 (*)    Lymphs Abs 5.3 (*)    All other components within normal limits  COMPREHENSIVE METABOLIC PANEL - Abnormal; Notable for the following:    Glucose, Bld 151 (*)    GFR calc non Af Amer 81 (*)    Anion gap 17 (*)    All other components within normal limits  URINALYSIS, ROUTINE W REFLEX MICROSCOPIC    Imaging Review No results found.   EKG Interpretation None       MDM   Final diagnoses:  None   73 yo male with persistent abd pain, plan for CT abd pelvis for possible diverculitis with abscess or other intraabdominal process.     Kalman Drape, MD 04/26/14 [phone]

## 2014-04-27 LAB — BASIC METABOLIC PANEL
Anion gap: 13 (ref 5–15)
BUN: 12 mg/dL (ref 6–23)
CHLORIDE: 107 meq/L (ref 96–112)
CO2: 22 mEq/L (ref 19–32)
Calcium: 9 mg/dL (ref 8.4–10.5)
Creatinine, Ser: 0.94 mg/dL (ref 0.50–1.35)
GFR calc Af Amer: >90 mL/min (ref >90)
GFR calc non Af Amer: 82 mL/min — ABNORMAL LOW (ref >90)
Glucose, Bld: 86 mg/dL (ref 70–99)
Potassium: 4.8 mEq/L (ref 3.7–5.3)
Sodium: 142 mEq/L (ref 137–147)

## 2014-04-27 LAB — GLUCOSE, CAPILLARY
GLUCOSE-CAPILLARY: 96 mg/dL (ref 70–99)
Glucose-Capillary: 91 mg/dL (ref 70–99)

## 2014-04-27 LAB — CBC
HEMATOCRIT: 37.6 % — AB (ref 39.0–52.0)
HEMOGLOBIN: 12.3 g/dL — AB (ref 13.0–17.0)
MCH: 28.4 pg (ref 26.0–34.0)
MCHC: 32.7 g/dL (ref 30.0–36.0)
MCV: 86.8 fL (ref 78.0–100.0)
Platelets: 181 10*3/uL (ref 150–400)
RBC: 4.33 MIL/uL (ref 4.22–5.81)
RDW: 13.9 % (ref 11.5–15.5)
WBC: 7.5 10*3/uL (ref 4.0–10.5)

## 2014-04-27 NOTE — Progress Notes (Signed)
  Subjective: He reports that his abdominal pain has completely resolved Had multiple BM's  Objective: Vital signs in last 24 hours: Temp:  [97.7 F (36.5 C)-98.1 F (36.7 C)] 98 F (36.7 C) (08/23 0606) Pulse Rate:  [78-93] 78 (08/23 0606) Resp:  [12-23] 16 (08/23 0606) BP: (119-158)/(66-83) 119/70 mmHg (08/23 0606) SpO2:  [97 %-100 %] 98 % (08/23 0606) Weight:  [152 lb 12.8 oz (69.31 kg)] 152 lb 12.8 oz (69.31 kg) (08/22 1530) Last BM Date: 04/26/14  Intake/Output from previous day: 08/22 0701 - 08/23 0700 In: 851.3 [I.V.:851.3] Out: -  Intake/Output this shift:    Abdomen soft and non tender  Lab Results:   Recent Labs  04/26/14 0028 04/27/14 0514  WBC 11.3* 7.5  HGB 13.8 12.3*  HCT 40.0 37.6*  PLT 212 181   BMET  Recent Labs  04/26/14 0028 04/27/14 0514  NA 140 142  K 3.7 4.8  CL 103 107  CO2 20 22  GLUCOSE 151* 86  BUN 16 12  CREATININE 0.96 0.94  CALCIUM 9.5 9.0   PT/INR No results found for this basename: LABPROT, INR,  in the last 72 hours ABG No results found for this basename: PHART, PCO2, PO2, HCO3,  in the last 72 hours  Studies/Results: Ct Abdomen Pelvis W Contrast  04/26/2014   CLINICAL DATA:  Abdominal pain.  EXAM: CT ABDOMEN AND PELVIS WITH CONTRAST  TECHNIQUE: Multidetector CT imaging of the abdomen and pelvis was performed using the standard protocol following bolus administration of intravenous contrast.  CONTRAST:  131mL OMNIPAQUE IOHEXOL 300 MG/ML  SOLN  COMPARISON:  None.  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 Visualized lung bases appear normal.  No gallstones are noted. No focal abnormality is noted in the liver, spleen or pancreas. Adrenal glands and kidneys appear normal. No hydronephrosis or renal obstruction is noted. No renal or ureteral calculi are noted. Atherosclerotic calcifications of the abdominal aorta are noted without aneurysm formation. There is no evidence of bowel obstruction. No  abnormal fluid collection is noted urinary bladder appears normal. Stool is noted in the rectum. No significant adenopathy is noted. The appendix is mildly thickened measuring 8 mm, but no surrounding inflammation is noted.  IMPRESSION: The appendix is mildly dilated at 8 mm, but no surrounding inflammation is noted. These findings are equivocal for early appendicitis, and correlation with patient's clinical symptoms and laboratory data is recommended. Critical Value/emergent results were called by telephone at the time of interpretation on 04/26/2014 at 8:11 am to Dr. Serita Grit, who verbally acknowledged these results.   Electronically Signed   By: Sabino Dick M.D.   On: 04/26/2014 08:12    Anti-infectives: Anti-infectives   None      Assessment/Plan:  Constipation  Discharge home Encouraged to get a primary care physician and colonoscopy  LOS: 1 day    Williemae Muriel A 04/27/2014

## 2014-04-27 NOTE — Discharge Instructions (Signed)
Metformin and X-ray Contrast Studies For some X-ray exams, a contrast dye is used. Contrast dye is a type of medicine used to make the X-ray image clearer. The contrast dye is given to the patient through a vein (intravenously). If you need to have this type of X-ray exam and you take a medication called metformin, your caregiver may have you stop taking metformin before the exam.  LACTIC ACIDOSIS In rare cases, a serious medical condition called lactic acidosis can develop in people who take metformin and receive contrast dye. The following conditions can increase the risk of this complication:   Kidney failure.  Liver problems.  Certain types of heart problems such as:  Heart failure.  Heart attack.  Heart infection.  Heart valve problems.  Alcohol abuse. If left untreated, lactic acidosis can lead to coma.  SYMPTOMS OF LACTIC ACIDOSIS Symptoms of lactic acidosis can include:  Rapid breathing (hyperventilation).  Neurologic symptoms such as:  Headaches.  Confusion.  Dizziness.  Excessive sweating.  Feeling sick to your stomach (nauseous) or throwing up (vomiting). AFTER THE X-RAY EXAM  Stay well-hydrated. Drink fluids as instructed by your caregiver.  If you have a risk of developing lactic acidosis, blood tests may be done to make sure your kidney function is okay.  Metformin is usually stopped for 48 hours after the X-ray exam. Ask your caregiver when you can start taking metformin again. SEEK MEDICAL CARE IF:   You have shortness of breath or difficulty breathing.  You develop a headache that does not go away.  You have nausea or vomiting.  You urinate more than normal.  You develop a skin rash and have:  Redness.  Swelling.  Itching. Document Released: 08/10/2009 Document Revised: 11/14/2011 Document Reviewed: 08/10/2009 [facility] Patient Information 2015 [place], [place]. This information is not intended to replace advice given to you by your health  care provider. Make sure you discuss any questions you have with your health care provider. Constipation Constipation is when a person has fewer than three bowel movements a week, has difficulty having a bowel movement, or has stools that are dry, hard, or larger than normal. As people grow older, constipation is more common. If you try to fix constipation with medicines that make you have a bowel movement (laxatives), the problem may get worse. Long-term laxative use may cause the muscles of the colon to become weak. A low-fiber diet, not taking in enough fluids, and taking certain medicines may make constipation worse.  CAUSES  Certain medicines, such as antidepressants, pain medicine, iron supplements, antacids, and water pills.  Certain diseases, such as diabetes, irritable bowel syndrome (IBS), thyroid disease, or depression.  Not drinking enough water.  Not eating enough fiber-rich foods.  Stress or travel.  Lack of physical activity or exercise.  Ignoring the urge to have a bowel movement.  Using laxatives too much.  SIGNS AND SYMPTOMS  Having fewer than three bowel movements a week.  Straining to have a bowel movement.  Having stools that are hard, dry, or larger than normal.  Feeling full or bloated.  Pain in the lower abdomen.  Not feeling relief after having a bowel movement.  DIAGNOSIS  Your health care provider will take a medical history and perform a physical exam. Further testing may be done for severe constipation. Some tests may include: A barium enema X-ray to examine your rectum, colon, and, sometimes, your small intestine.  A sigmoidoscopy to examine your lower colon.  A colonoscopy to examine your entire  colon. TREATMENT  Treatment will depend on the severity of your constipation and what is causing it. Some dietary treatments include drinking more fluids and eating more fiber-rich foods. Lifestyle treatments may include regular exercise. If these diet  and lifestyle recommendations do not help, your health care provider may recommend taking over-the-counter laxative medicines to help you have bowel movements. Prescription medicines may be prescribed if over-the-counter medicines do not work.  HOME CARE INSTRUCTIONS  Eat foods that have a lot of fiber, such as fruits, vegetables, whole grains, and beans. Limit foods high in fat and processed sugars, such as french fries, hamburgers, cookies, candies, and soda.  A fiber supplement may be added to your diet if you cannot get enough fiber from foods.  Drink enough fluids to keep your urine clear or pale yellow.  Exercise regularly or as directed by your health care provider.  Go to the restroom when you have the urge to go. Do not hold it.  Only take over-the-counter or prescription medicines as directed by your health care provider. Do not take other medicines for constipation without talking to your health care provider first.  [place] IF:  You have bright red blood in your stool.  Your constipation lasts for more than 4 days or gets worse.  You have abdominal or rectal pain.  You have thin, pencil-like stools.  You have unexplained weight loss. MAKE SURE YOU:  Understand these instructions. Will watch your condition. Will get help right away if you are not doing well or get worse. Document Released: 05/20/2004 Document Revised: 08/27/2013 Document Reviewed: 06/03/2013 [facility] Patient Information 2015 [place], [place]. This information is not intended to replace advice given to you by your health care provider. Make sure you discuss any questions you have with your health care provider.

## 2014-04-27 NOTE — Progress Notes (Signed)
D/c to home with wife and daughter to home. D/c instructions given and patient and family and teachback given.  Pain denied VSS

## 2014-04-27 NOTE — Progress Notes (Signed)
UR completed 

## 2014-04-27 NOTE — Discharge Summary (Signed)
Physician Discharge Summary  Patient ID: Tony Banks MRN: [id number] DOB/AGE: 1941/06/08 73 y.o.  Admit date: 04/26/2014 Discharge date: 04/27/2014  Admission Diagnoses:  Discharge Diagnoses:  Principal Problem:   Abdominal pain Active Problems:   Constipation   Leukocytosis   Diabetes mellitus   Discharged Condition: good  Hospital Course: became pain free after admission, enemas  Consults: None  Significant Diagnostic Studies:   Treatments: IV hydration  Discharge Exam: Blood pressure 119/70, pulse 78, temperature 98 F (36.7 C), temperature source Oral, resp. rate 16, height 5\' 8"  (1.727 m), weight 152 lb 12.8 oz (69.31 kg), SpO2 98.00%. General appearance: alert, cooperative and no distress GI: soft, non-tender; bowel sounds normal; no masses,  no organomegaly  Disposition: Final discharge disposition not confirmed Needs to find a primary care physician and consider colonoscopy     Medication List         ciprofloxacin 500 MG tablet  Commonly known as:  CIPRO  Take 1 tablet (500 mg total) by mouth 2 (two) times daily.     metFORMIN 500 MG tablet  Commonly known as:  GLUCOPHAGE  Take one daily before evening meal. After one week increase to twice daily breakfast and supper for diabetes     metroNIDAZOLE 500 MG tablet  Commonly known as:  FLAGYL  Take 1 tablet (500 mg total) by mouth 3 (three) times daily.           Follow-up Information   Follow up with Gayland Curry, MD. (As needed)    Specialty:  General Surgery   Contact information:   [address] [place] [address] [phone]       Signed: Harl Bowie 04/27/2014, 8:57 AM

## 2014-05-02 ENCOUNTER — Ambulatory Visit (INDEPENDENT_AMBULATORY_CARE_PROVIDER_SITE_OTHER): Payer: Medicare HMO

## 2014-05-02 ENCOUNTER — Encounter: Payer: Self-pay | Admitting: Family Medicine

## 2014-05-02 ENCOUNTER — Ambulatory Visit (INDEPENDENT_AMBULATORY_CARE_PROVIDER_SITE_OTHER): Payer: Medicare HMO | Admitting: Family Medicine

## 2014-05-02 VITALS — BP 156/87 | HR 94 | Ht 68.0 in | Wt 145.0 lb

## 2014-05-02 DIAGNOSIS — R109 Unspecified abdominal pain: Secondary | ICD-10-CM

## 2014-05-02 DIAGNOSIS — Z1211 Encounter for screening for malignant neoplasm of colon: Secondary | ICD-10-CM

## 2014-05-02 DIAGNOSIS — K5900 Constipation, unspecified: Secondary | ICD-10-CM

## 2014-05-02 DIAGNOSIS — R634 Abnormal weight loss: Secondary | ICD-10-CM

## 2014-05-02 DIAGNOSIS — E119 Type 2 diabetes mellitus without complications: Secondary | ICD-10-CM

## 2014-05-02 IMAGING — CR DG ABDOMEN 2V
3 series · 3 of 3 positions shown · non-contrast
Comparison: [DATE]

CLINICAL DATA: Lower abdominal pain and constipation

EXAM:
ABDOMEN - 2 VIEW

[view not recorded (1 of 3)]
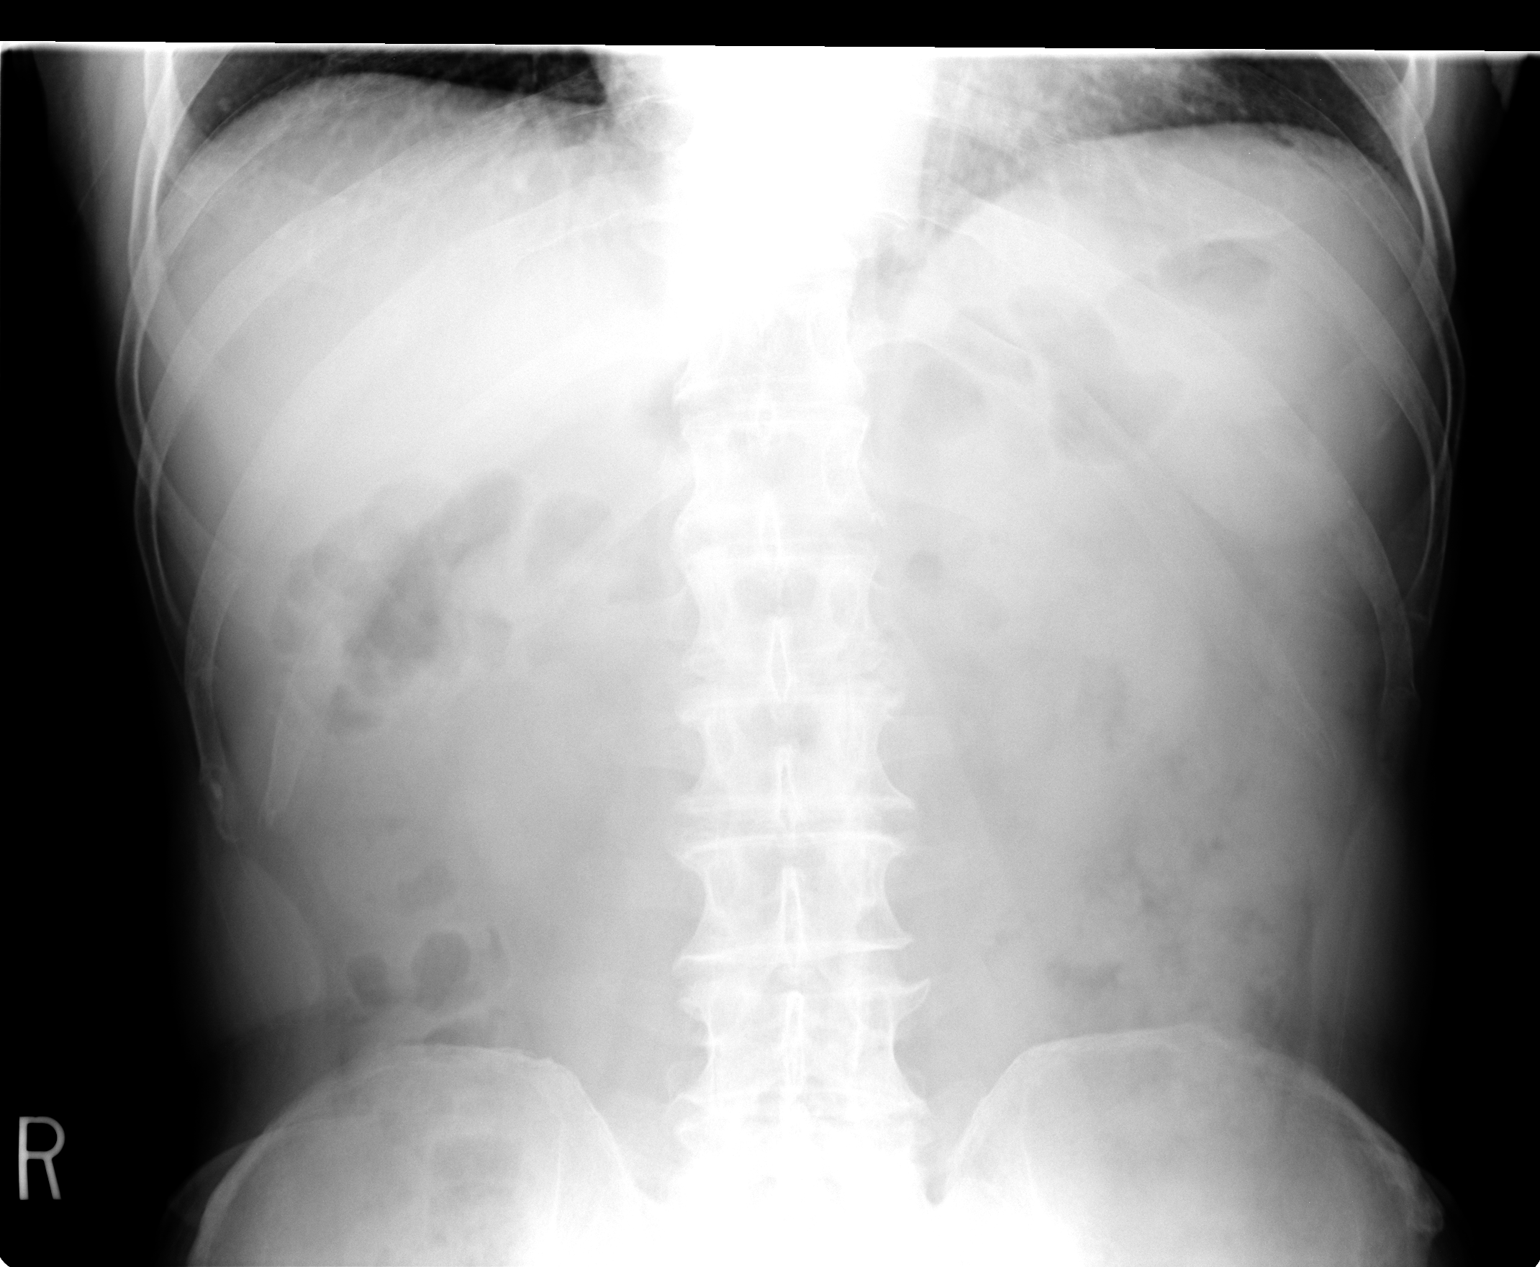

[view not recorded (2 of 3)]
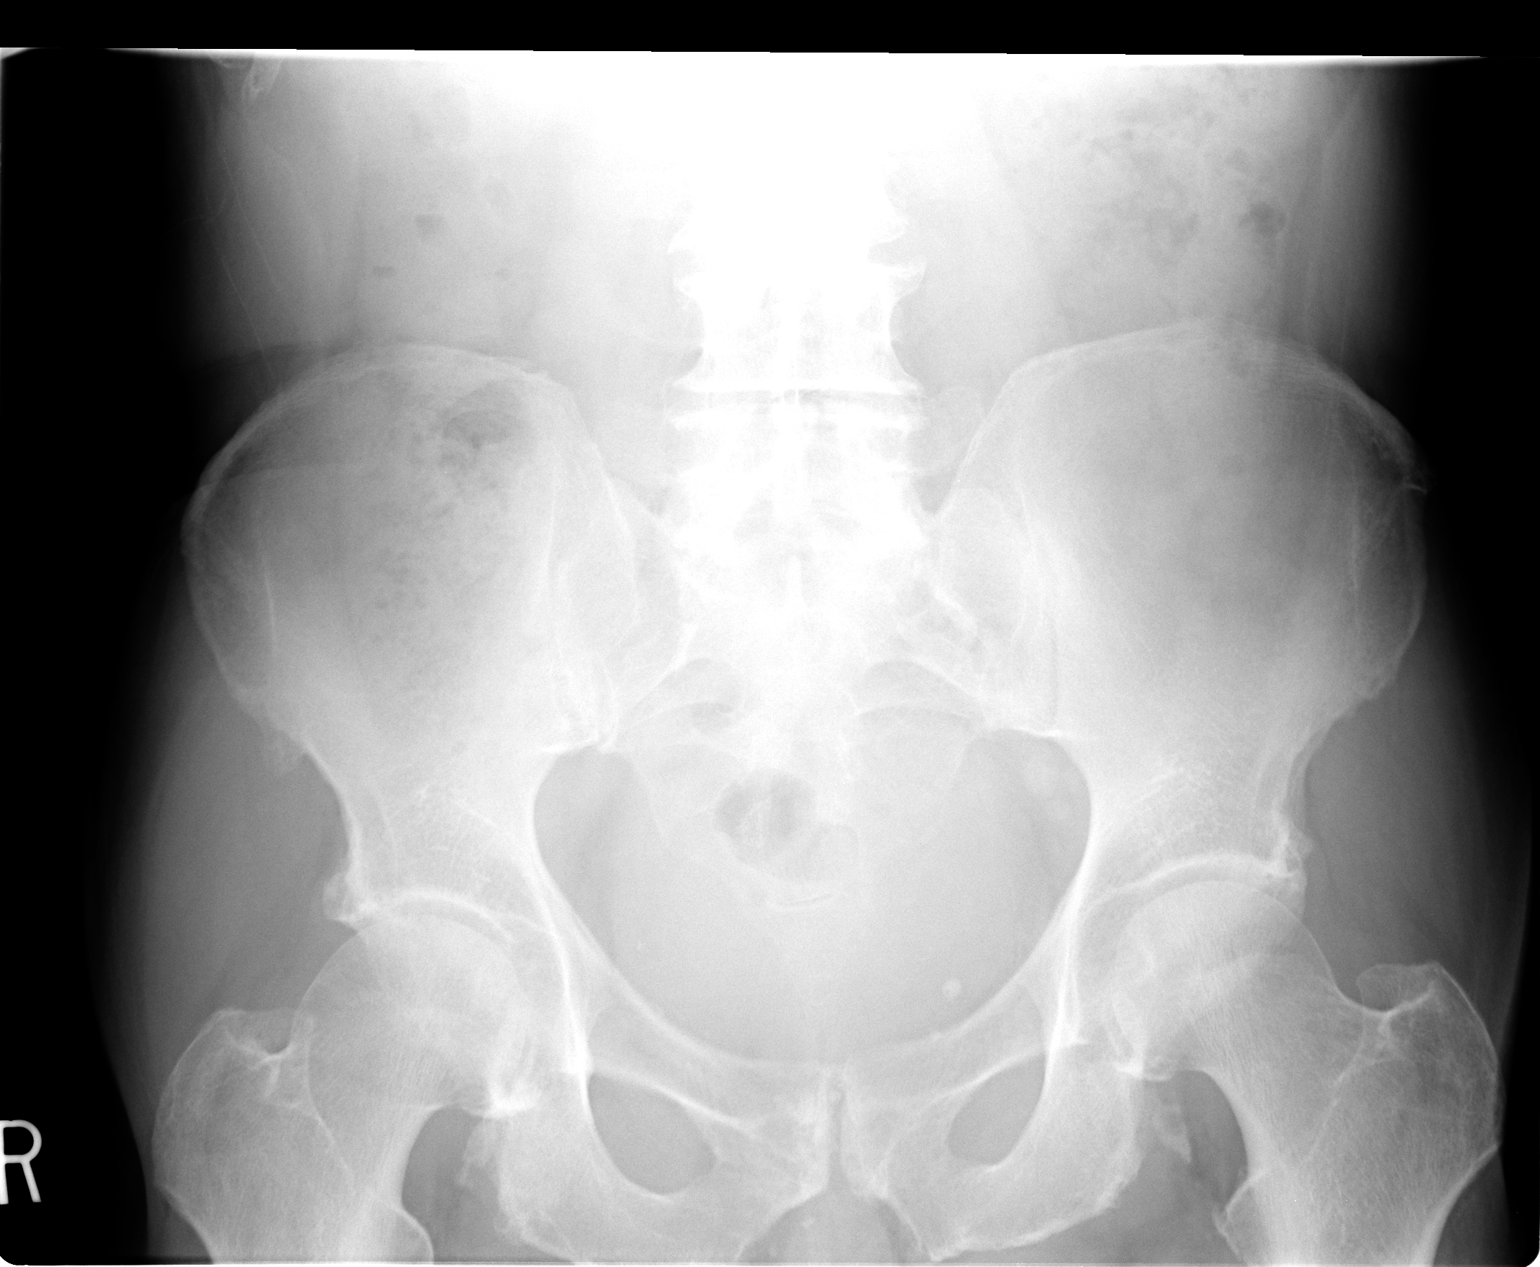

[view not recorded (3 of 3)]
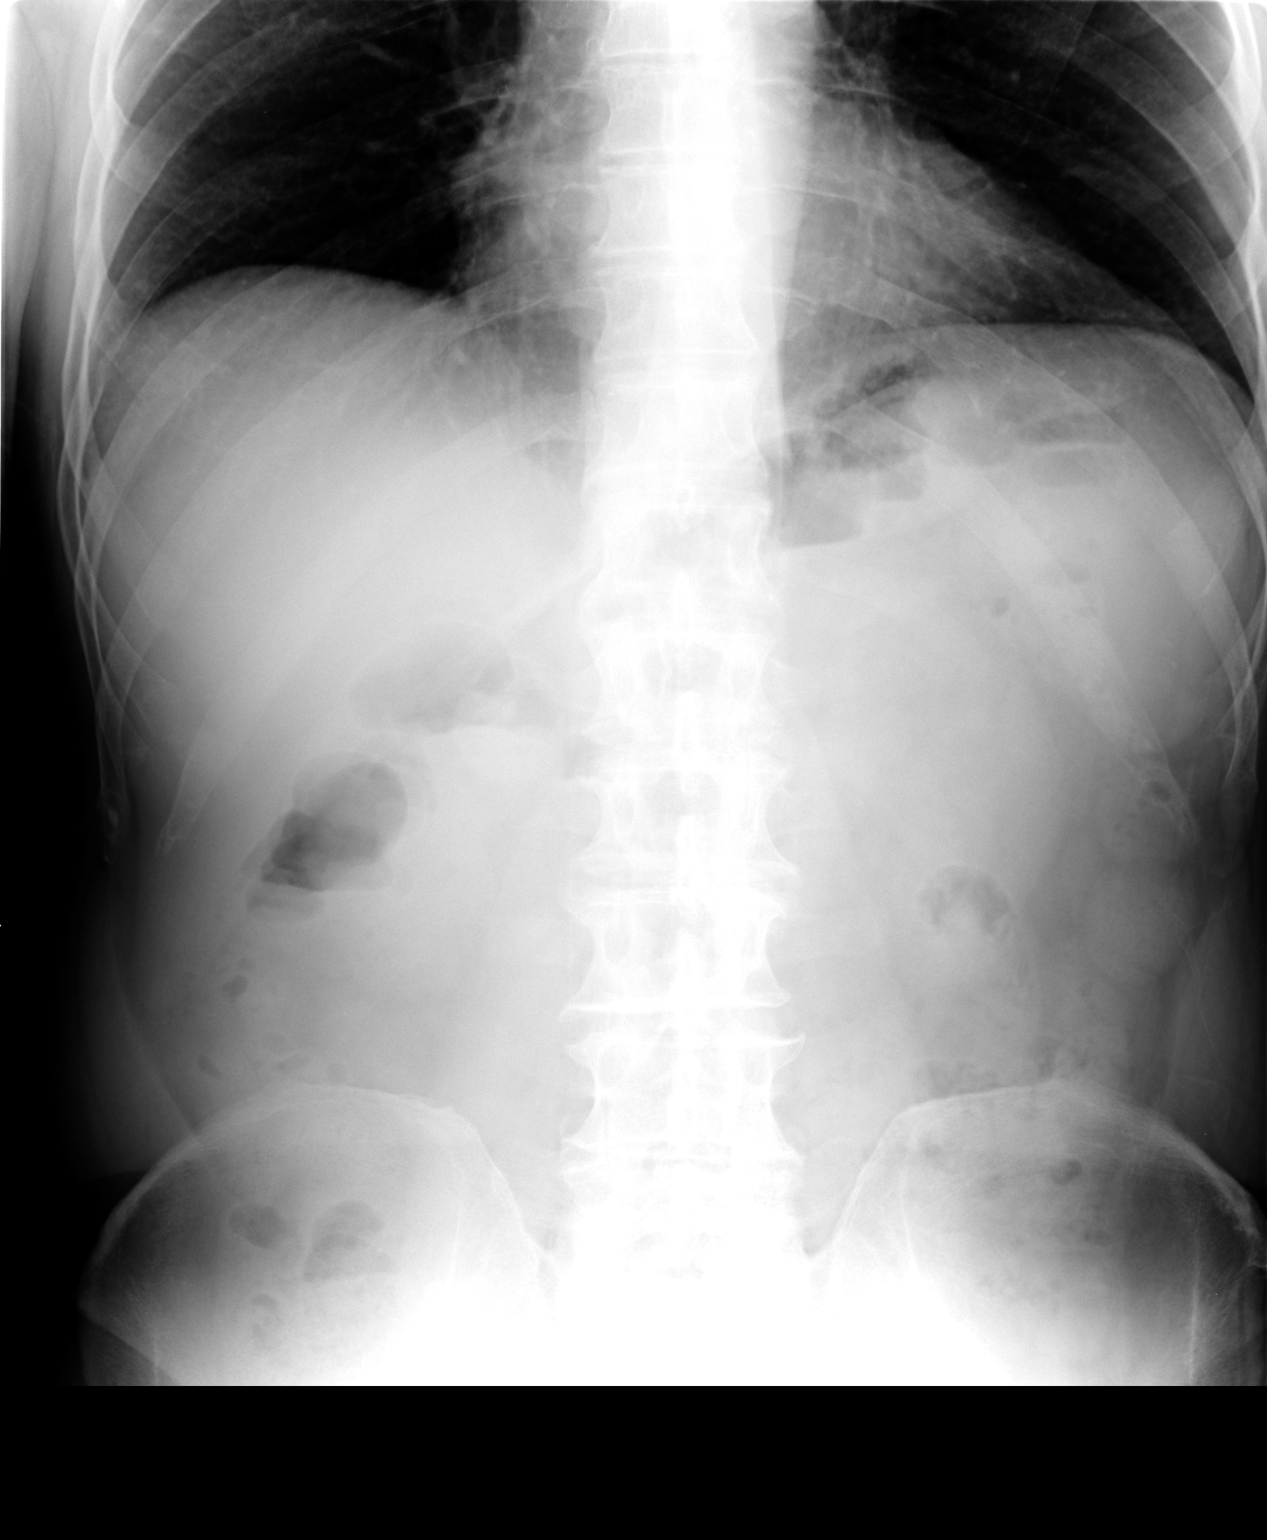

[3 of 3 positions shown; findings below may reference images not displayed]

FINDINGS: Supine and upright abdomen images were obtained. There is diffuse
stool throughout the colon. Bowel gas pattern is unremarkable. No
obstruction or free air. There are phleboliths in the pelvis.
IMPRESSION: Fairly diffuse stool throughout colon. Bowel gas pattern
unremarkable.

## 2014-05-02 MED ORDER — SORBITOL 70 % PO SOLN: 15.0000 mL | Freq: Two times a day (BID) | ORAL | Status: DC | PRN

## 2014-05-02 NOTE — Progress Notes (Signed)
CC: Tony Banks is a 73 y.o. male is here for Establish Care and hospital f/u   Subjective: HPI:  Very pleasant 73 year old here to establish care   Recently admitted for approximately 24 hours to monitor abdominal pain that was resolved after 2 enemas. He states that since being discharged from the hospital he's only had 2 bowel movements and they were small enough to be described as only a few teaspoons of stool. He tells me that for years he's only had a bowel movement 3 times a week without any discomfort however 2 weeks ago had his bowel movements and severe abdominal cramping localized in the lower abdomen.  Since discharge some of the pain has returned but only to a mild degree. Nothing makes the pain better or worse. No interventions as of yet since discharge.  Reports history of type 2 diabetes diagnosed years ago. He had an A1c checked 2 weeks ago that was 9, he was told to start on metformin however he wants to know if he really has to do this since he points out that for the past months he's been eating cakes, cookies, pie pretty much every 30-60 minutes while he was awake. This is in addition to 3 large meals a day. He has stopped his diet and now is restricting his carbohydrates to only 60 g a day. He brings in blood sugar showing all fasting blood sugars below 120 and only one postprandial blood sugar over 180 over the past week without taking metformin.  Reports unintentional weight loss occurring for the past 6 months. He believes he's lost approximately 20 pounds despite high caloric intake described above.  His last colonoscopy was approximately 15 years ago and he tells me did not show any abnormality but it was not complete due to poor preparation. He does not believe his PSA has ever been checked.  Review of Systems - General ROS: negative for - chills, fever, night sweats, weight loss Ophthalmic ROS: negative for - decreased vision Psychological ROS: negative for - anxiety or  depression ENT ROS: negative for - hearing change, nasal congestion, tinnitus or allergies Hematological and Lymphatic ROS: negative for - bleeding problems, bruising or swollen lymph nodes Breast ROS: negative Respiratory ROS: no cough, shortness of breath, or wheezing Cardiovascular ROS: no chest pain or dyspnea on exertion Gastrointestinal ROS: no  or black or bloody stools Genito-Urinary ROS: negative for - genital discharge, genital ulcers, incontinence or abnormal bleeding from genitals Musculoskeletal ROS: negative for - joint pain or muscle pain Neurological ROS: negative for - headaches or memory loss Dermatological ROS: negative for lumps, mole changes, rash and skin lesion changes  Past Medical History  Diagnosis Date  . Diabetes mellitus without complication     Past Surgical History  Procedure Laterality Date  . Fracture surgery    . Vasectomy     Family History  Problem Relation Age of Onset  . Heart disease Mother   . Stroke Mother   . Cancer Father   . Stroke Maternal Grandfather     History   Social History  . Marital Status: Married    Spouse Name: N/A    Number of Children: N/A  . Years of Education: N/A   Occupational History  . Not on file.   Social History Main Topics  . Smoking status: Never Smoker   . Smokeless tobacco: Not on file  . Alcohol Use: No  . Drug Use: No  . Sexual Activity: No  Other Topics Concern  . Not on file   Social History Narrative  . No narrative on file     Objective: BP 156/87  Pulse 94  Ht 5\' 8"  (1.727 m)  Wt 145 lb (65.772 kg)  BMI 22.05 kg/m2  General: Alert and Oriented, No Acute Distress HEENT: Pupils equal, round, reactive to light. Conjunctivae clear.  Moist membranes pharynx unremarkable Lungs: Clear to auscultation bilaterally, no wheezing/ronchi/rales.  Comfortable work of breathing. Good air movement. Cardiac: Regular rate and rhythm. Normal S1/S2.  No murmurs, rubs, nor gallops.   Abdomen:  Normal bowel sounds, soft and non tender without palpable masses. Extremities: No peripheral edema.  Strong peripheral pulses.  Mental Status: No depression, anxiety, nor agitation. Skin: Warm and dry.  Assessment & Plan: Tony Banks was seen today for establish care and hospital f/u.  Diagnoses and associated orders for this visit:  Screening for colon cancer - Ambulatory referral to Gastroenterology  Type 2 diabetes mellitus without complication  Abdominal pain, other specified site - DG Abd 2 Views; Future - sorbitol 70 % solution; Take 15 mLs by mouth 2 (two) times daily as needed (constipation).  Unintentional weight loss - PSA - TSH    Abdominal pain: Suspect constipation, Start sorbitol as needed after x-rays obtained provided it does not show an obstruction.  He's overdue for colonoscopy and a referral has been placed Type 2 diabetes: Currently diet controlled, joint decision to hold on metformin since he had such a horrible diet and has been successful with dietary changes. Followup in 2-3 months for A1c and if above 7 we'll begin metformin Unintentional weight loss: Checking PSA and thyroid function   Return in about 3 months (around 08/02/2014) for Diabetic Follow Up.

## 2014-05-03 LAB — PSA: PSA: 0.53 ng/mL (ref <=4.00)

## 2014-05-03 LAB — TSH: TSH: 2.078 u[IU]/mL (ref 0.350–4.500)

## 2014-05-05 ENCOUNTER — Telehealth: Payer: Self-pay | Admitting: *Deleted

## 2014-05-05 DIAGNOSIS — K5904 Chronic idiopathic constipation: Secondary | ICD-10-CM

## 2014-05-05 MED ORDER — LINACLOTIDE 145 MCG PO CAPS: 145.0000 ug | ORAL_CAPSULE | Freq: Every day | ORAL | Status: DC

## 2014-05-05 NOTE — Telephone Encounter (Signed)
Pt.notified

## 2014-05-05 NOTE — Telephone Encounter (Signed)
Pt states he has not had any significant movement of his bowels

## 2014-05-05 NOTE — Telephone Encounter (Signed)
Rx for Linzess sent to his [facility] on file, please ask patient to update me with effectiveness in one week.

## 2014-05-09 ENCOUNTER — Encounter: Payer: Self-pay | Admitting: Gastroenterology

## 2014-05-13 ENCOUNTER — Ambulatory Visit (AMBULATORY_SURGERY_CENTER): Payer: Commercial Managed Care - HMO

## 2014-05-13 VITALS — Ht 68.0 in | Wt 144.0 lb

## 2014-05-13 DIAGNOSIS — Z1211 Encounter for screening for malignant neoplasm of colon: Secondary | ICD-10-CM

## 2014-05-13 MED ORDER — MOVIPREP 100 G PO SOLR: 1.0000 | Freq: Once | ORAL | Status: DC

## 2014-05-13 NOTE — Progress Notes (Signed)
No allegies to eggs or soy No past problems with anesthesia No home oxygen No diet/weight loss meds  Has email  Emmi instructions given for colonoscopy

## 2014-05-22 ENCOUNTER — Ambulatory Visit (AMBULATORY_SURGERY_CENTER): Payer: Commercial Managed Care - HMO | Admitting: Gastroenterology

## 2014-05-22 ENCOUNTER — Encounter: Payer: Self-pay | Admitting: Gastroenterology

## 2014-05-22 VITALS — BP 146/74 | HR 69 | Temp 97.2°F | Resp 24 | Ht 68.0 in | Wt 144.0 lb

## 2014-05-22 DIAGNOSIS — K5730 Diverticulosis of large intestine without perforation or abscess without bleeding: Secondary | ICD-10-CM

## 2014-05-22 DIAGNOSIS — Z1211 Encounter for screening for malignant neoplasm of colon: Secondary | ICD-10-CM

## 2014-05-22 MED ORDER — SODIUM CHLORIDE 0.9 % IV SOLN: 500.0000 mL | INTRAVENOUS | Status: DC

## 2014-05-22 NOTE — Patient Instructions (Signed)

## 2014-05-22 NOTE — Op Note (Addendum)
[facility]  [address] [place], [address]   COLONOSCOPY PROCEDURE REPORT  PATIENT: Tony, Banks  MR#: [id number] BIRTHDATE: 1941/06/28 , 73  yrs. old GENDER: Male ENDOSCOPIST: Inda Castle, MD REFERRED BY: PROCEDURE DATE:  05/22/2014 PROCEDURE:   Colonoscopy, diagnostic First Screening Colonoscopy - Avg.  risk and is 50 yrs.  old or older - No.  Prior Negative Screening - Now for repeat screening. 10 or more years since last screening  History of Adenoma - Now for follow-up colonoscopy & has been > or = to 3 yrs.  N/A  Polyps Removed Today? No.  Recommend repeat exam, <10 yrs? No. ASA CLASS:   Class II INDICATIONS:Constipation.  colorectal cancer screening MEDICATIONS: MAC sedation, administered by CRNA and propofol (Diprivan) 200mg  IV  DESCRIPTION OF PROCEDURE:   After the risks benefits and alternatives of the procedure were thoroughly explained, informed consent was obtained.  A digital rectal exam revealed no abnormalities of the rectum.   The [id number]  endoscope was introduced through the anus and advanced to the cecum, which was identified by both the appendix and ileocecal valve. No adverse events experienced.   The quality of the prep was excellent using Suprep  The instrument was then slowly withdrawn as the colon was fully examined.      COLON FINDINGS: Moderate diverticulosis was noted in the sigmoid colon.   The colon was otherwise normal.  There was no diverticulosis, inflammation, polyps or cancers unless previously stated.  Retroflexed views revealed no abnormalities. The time to cecum=2 minutes 29 seconds.  Withdrawal time=7 minutes 30 seconds. The scope was withdrawn and the procedure completed. COMPLICATIONS: There were no complications.  ENDOSCOPIC IMPRESSION: 1.   Moderate diverticulosis was noted in the sigmoid colon 2.   The colon was otherwise normal  RECOMMENDATIONS: Given your age, you will not need another  colonoscopy for colon cancer screening or polyp surveillance.  These types of tests usually stop around the age 47.   eSigned:  Inda Castle, MD 05/23/2014 10:25 AM Revised: 05/23/2014 10:25 AM  cc: Doran Stabler, MDCarbon Copy]   PATIENT NAME:  Tony, Banks MR#: [id number]

## 2014-05-22 NOTE — Progress Notes (Signed)
Report to PACU, RN, vss, BBS= Clear.  

## 2014-05-22 NOTE — Progress Notes (Signed)
No problems noted in the recovery room. maw 

## 2014-05-23 ENCOUNTER — Telehealth: Payer: Self-pay | Admitting: *Deleted

## 2014-05-23 NOTE — Telephone Encounter (Signed)
  Follow up Call-  Call back number 05/22/2014  Post procedure Call Back phone  # [phone]  Permission to leave phone message Yes     Patient questions:  Do you have a fever, pain , or abdominal swelling? No. Pain Score  0 *  Have you tolerated food without any problems? Yes.    Have you been able to return to your normal activities? Yes.    Do you have any questions about your discharge instructions: Diet   No. Medications  No. Follow up visit  No.  Do you have questions or concerns about your Care? No.  Actions: * If pain score is 4 or above: No action needed, pain <4.

## 2014-07-25 ENCOUNTER — Ambulatory Visit (INDEPENDENT_AMBULATORY_CARE_PROVIDER_SITE_OTHER): Payer: Medicare HMO | Admitting: Family Medicine

## 2014-07-25 ENCOUNTER — Encounter: Payer: Self-pay | Admitting: Family Medicine

## 2014-07-25 VITALS — BP 141/69 | HR 80 | Wt 135.0 lb

## 2014-07-25 DIAGNOSIS — K5900 Constipation, unspecified: Secondary | ICD-10-CM

## 2014-07-25 DIAGNOSIS — E119 Type 2 diabetes mellitus without complications: Secondary | ICD-10-CM

## 2014-07-25 LAB — POCT GLYCOSYLATED HEMOGLOBIN (HGB A1C): HEMOGLOBIN A1C: 5.9

## 2014-07-25 MED ORDER — METFORMIN HCL 500 MG PO TABS: 500.0000 mg | ORAL_TABLET | Freq: Two times a day (BID) | ORAL | Status: DC

## 2014-07-25 NOTE — Progress Notes (Signed)
CC: Tony Banks is a 73 y.o. male is here for Diabetes   Subjective: HPI:  Follow up type 2 diabetes: Since I saw him last he was taking his blood sugar every morning it was consistently below 120 fasting until the middle of October when he had a reading of 220. After that reading he began taking metformin 500 mg twice a day. Blood sugar value since then have been ranging between 120 and 140 fasting. No postprandials to report. He denies polyuria or polyphagia polydipsia. He's been doing some experimentation and blood sugars fasting seem to be worse following the days that he is about carbohydrate restriction and best if he does not monitor any carbohydrate intake.  Follow constipation: He never began taking linzess because bowel irregularity drastically improved after a colonoscopy. He occasionally takes a dose of MiraLAX if he has not had a bowel movement within 24 hours and this usually resets his bowel movements to every day. He denies any abdominal pain, nausea, nor pain with defecation.  Review Of Systems Outlined In HPI  Past Medical History  Diagnosis Date  . Diabetes mellitus without complication   . Constipation     severe  . Nephrolithiasis     Past Surgical History  Procedure Laterality Date  . Fracture surgery    . Vasectomy    . Shoulder arthroscopy      bilat   Family History  Problem Relation Age of Onset  . Heart disease Mother   . Stroke Mother   . Cancer Father   . Stroke Maternal Grandfather   . Colon cancer Neg Hx   . Pancreatic cancer Neg Hx   . Rectal cancer Neg Hx   . Stomach cancer Neg Hx     History   Social History  . Marital Status: Married    Spouse Name: N/A    Number of Children: N/A  . Years of Education: N/A   Occupational History  . Not on file.   Social History Main Topics  . Smoking status: Former Research scientist (life sciences)  . Smokeless tobacco: Never Used     Comment: quit 30 yrs ago  . Alcohol Use: No  . Drug Use: No  . Sexual Activity: No    Other Topics Concern  . Not on file   Social History Narrative     Objective: BP 141/69 mmHg  Pulse 80  Wt 135 lb (61.236 kg)  General: Alert and Oriented, No Acute Distress HEENT: Pupils equal, round, reactive to light. Conjunctivae clear.  Moist mucous membranes Unremarkable Lungs: Clear to auscultation bilaterally, no wheezing/ronchi/rales.  Comfortable work of breathing. Good air movement. Cardiac: Regular rate and rhythm. Normal S1/S2.  No murmurs, rubs, nor gallops.   Extremities: No peripheral edema.  Strong peripheral pulses.  Mental Status: No depression, anxiety, nor agitation. Skin: Warm and dry.  Assessment & Plan: Tony Banks was seen today for diabetes.  Diagnoses and associated orders for this visit:  Type 2 diabetes mellitus without complication - POCT HgB P3X - metFORMIN (GLUCOPHAGE) 500 MG tablet; Take 1 tablet (500 mg total) by mouth 2 (two) times daily with a meal.  Constipation, unspecified constipation type    Type 2 diabetes: A1c of 5.9 compared to 9 over the summer. Even though fasting blood sugars are not at goal we will make no adjustments to his twice a day metformin regimen as it appears he is at goal with his favorable A1c. Constipation: Controlled continue as needed use of MiraLAX  25 minutes  spent face-to-face during visit today of which at least 50% was counseling or coordinating care regarding: 1. Type 2 diabetes mellitus without complication   2. Constipation, unspecified constipation type      Return in about 3 months (around 10/25/2014) for Diabetes Follow Up.

## 2014-08-20 ENCOUNTER — Encounter: Payer: Self-pay | Admitting: Family Medicine

## 2014-08-20 ENCOUNTER — Ambulatory Visit (INDEPENDENT_AMBULATORY_CARE_PROVIDER_SITE_OTHER): Payer: Commercial Managed Care - HMO | Admitting: Family Medicine

## 2014-08-20 VITALS — BP 175/91 | HR 114 | Wt 143.0 lb

## 2014-08-20 DIAGNOSIS — H00013 Hordeolum externum right eye, unspecified eyelid: Secondary | ICD-10-CM

## 2014-08-20 DIAGNOSIS — M7061 Trochanteric bursitis, right hip: Secondary | ICD-10-CM

## 2014-08-20 MED ORDER — AMBULATORY NON FORMULARY MEDICATION: Status: AC

## 2014-08-20 MED ORDER — POLYMYXIN B-TRIMETHOPRIM 10000-0.1 UNIT/ML-% OP SOLN: 1.0000 [drp] | OPHTHALMIC | Status: DC

## 2014-08-20 NOTE — Progress Notes (Signed)
CC: Tony Banks is a 73 y.o. male is here for Joint pain on left side and eye irritation   Subjective: HPI:  Complains of a sharp pain localized on the right posterior lateral hip that radiates down into the knee and occasionally all the way down into the ankle. Symptoms aren't present on a daily basis for the past 3 weeks. It began 2 weeks after starting metformin and he is concerned that this is a side effect of metformin. He denies any joint pain elsewhere. He denies any motor or sensory disturbances elsewhere. He denies any weakness in his lower extremities. He denies any recent overexertion or trauma. Pain seems to be most noticeable at rest and least noticeable with any activity such as walking or even standing is okay. Denies any overlying skin changes at the site of discomfort. Slightly improved with Tylenol or Excedrin.  Complains of swelling of the entire eyelid that has been present for a little under a week. It slowly improving without any intervention. It is irritated but not necessarily painful in his opinion. He's never had this before. He denies any vision loss, discharge from the eye, fevers, chills nor any other ocular complaints   Review Of Systems Outlined In HPI  Past Medical History  Diagnosis Date  . Diabetes mellitus without complication   . Constipation     severe  . Nephrolithiasis     Past Surgical History  Procedure Laterality Date  . Fracture surgery    . Vasectomy    . Shoulder arthroscopy      bilat   Family History  Problem Relation Age of Onset  . Heart disease Mother   . Stroke Mother   . Cancer Father   . Stroke Maternal Grandfather   . Colon cancer Neg Hx   . Pancreatic cancer Neg Hx   . Rectal cancer Neg Hx   . Stomach cancer Neg Hx     History   Social History  . Marital Status: Married    Spouse Name: N/A    Number of Children: N/A  . Years of Education: N/A   Occupational History  . Not on file.   Social History Main Topics  .  Smoking status: Former Research scientist (life sciences)  . Smokeless tobacco: Never Used     Comment: quit 30 yrs ago  . Alcohol Use: No  . Drug Use: No  . Sexual Activity: No   Other Topics Concern  . Not on file   Social History Narrative     Objective: BP 175/91 mmHg  Pulse 114  Wt 143 lb (64.864 kg)  General: Alert and Oriented, No Acute Distress HEENT: Pupils equal, round, reactive to light. Bulbar Conjunctivae clear the the medial lateral aspect of the right inferior lid is moderately erythematous and mildly edematous..  Moist mucous membranes pharynx unremarkable  Lungs:Clear and comfortable work of breathing  Cardiac: Regular rate and rhythm.  Extremities: No peripheral edema.  Strong peripheral pulses.  right lower extremity exam reproduces some of his pain with palpation of the greater trochanter. He also has a negative straight leg raise, negative Faber, negative fadir and no pain with internal or external rotation of the femur. Mental Status: No depression, anxiety, nor agitation. Skin: Warm and dry.  Assessment & Plan: Chesney was seen today for joint pain on left side and eye irritation.  Diagnoses and associated orders for this visit:  Stye, right - trimethoprim-polymyxin b (POLYTRIM) ophthalmic solution; Place 1 drop into the right eye every  4 (four) hours. For ten days.  Greater trochanteric bursitis, right  Other Orders - AMBULATORY NON FORMULARY MEDICATION; Accu-chek aviva plus test strips and accu-chek softclix lancets, use to check blood sugar up to four times a day. Dx Type 2 diabetes.     I have the right eye: Start Polytrim and warm compresses.Signs and symptoms requring emergent/urgent reevaluation were discussed with the patient. A home rehabilitative plan was given to him to be done on a daily basis for the next 3-4 weeks to help with his hip pain, discussed I don't think this is coming from any intra-articular process, if no better in 1 week will refer for formal physical  therapy   sReturn if symptoms worsen or fail to improve.

## 2014-10-27 ENCOUNTER — Encounter: Payer: Self-pay | Admitting: Family Medicine

## 2014-10-27 ENCOUNTER — Ambulatory Visit (INDEPENDENT_AMBULATORY_CARE_PROVIDER_SITE_OTHER): Payer: Commercial Managed Care - HMO | Admitting: Family Medicine

## 2014-10-27 ENCOUNTER — Ambulatory Visit (INDEPENDENT_AMBULATORY_CARE_PROVIDER_SITE_OTHER): Payer: Commercial Managed Care - HMO

## 2014-10-27 VITALS — BP 158/78 | HR 90 | Wt 142.0 lb

## 2014-10-27 DIAGNOSIS — I152 Hypertension secondary to endocrine disorders: Secondary | ICD-10-CM | POA: Insufficient documentation

## 2014-10-27 DIAGNOSIS — I10 Essential (primary) hypertension: Secondary | ICD-10-CM | POA: Diagnosis not present

## 2014-10-27 DIAGNOSIS — E119 Type 2 diabetes mellitus without complications: Secondary | ICD-10-CM

## 2014-10-27 DIAGNOSIS — M8938 Hypertrophy of bone, other site: Secondary | ICD-10-CM

## 2014-10-27 DIAGNOSIS — M25562 Pain in left knee: Secondary | ICD-10-CM

## 2014-10-27 DIAGNOSIS — E1159 Type 2 diabetes mellitus with other circulatory complications: Secondary | ICD-10-CM | POA: Insufficient documentation

## 2014-10-27 DIAGNOSIS — M76892 Other specified enthesopathies of left lower limb, excluding foot: Secondary | ICD-10-CM | POA: Diagnosis not present

## 2014-10-27 DIAGNOSIS — M1712 Unilateral primary osteoarthritis, left knee: Secondary | ICD-10-CM | POA: Diagnosis not present

## 2014-10-27 LAB — POCT UA - MICROALBUMIN

## 2014-10-27 LAB — POCT GLYCOSYLATED HEMOGLOBIN (HGB A1C): Hemoglobin A1C: 6.3

## 2014-10-27 IMAGING — DX DG KNEE COMPLETE 4+V*L*
4 series · 4 of 4 positions shown · non-contrast
Comparison: None.

CLINICAL DATA: 73-year-old male with left knee pain for the past
month. No known injury. First encounter.

EXAM:
LEFT KNEE - COMPLETE 4+ VIEW

[knee ap]
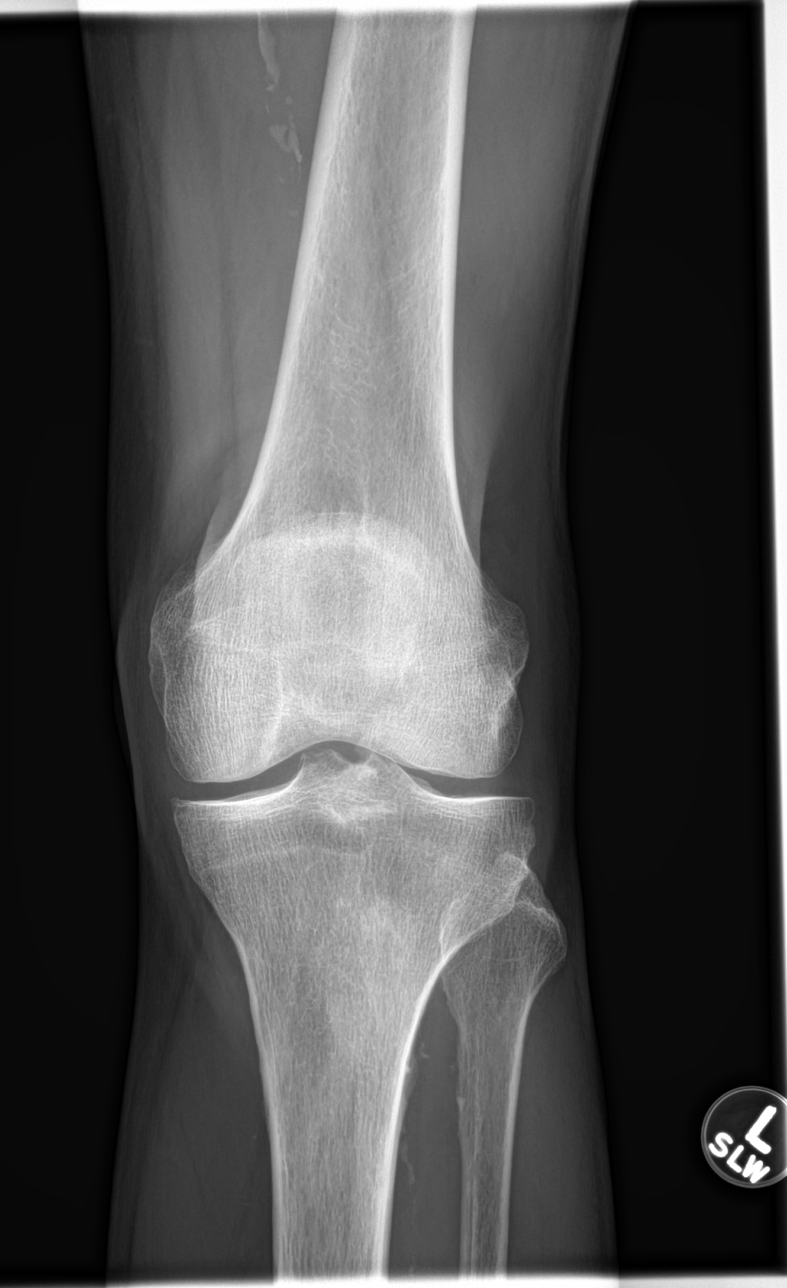

[[person_name]]
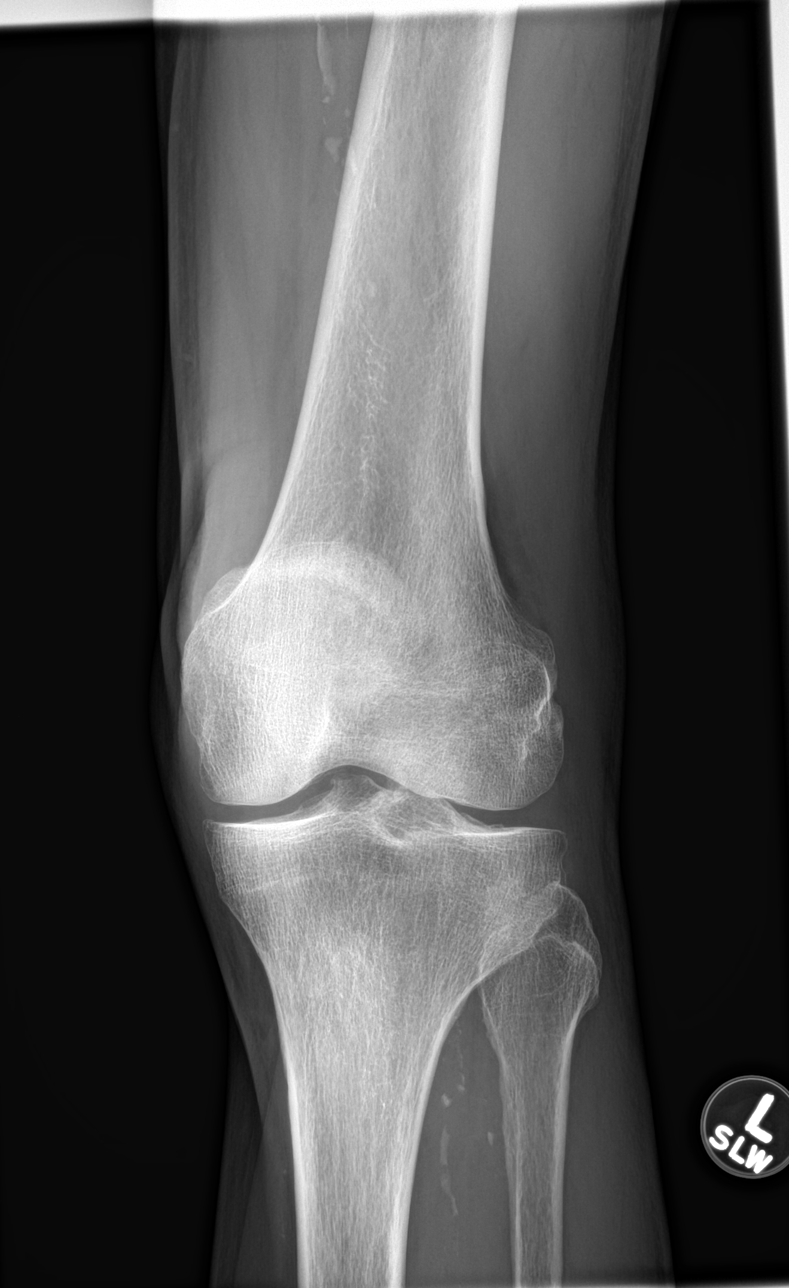

[knee lat]
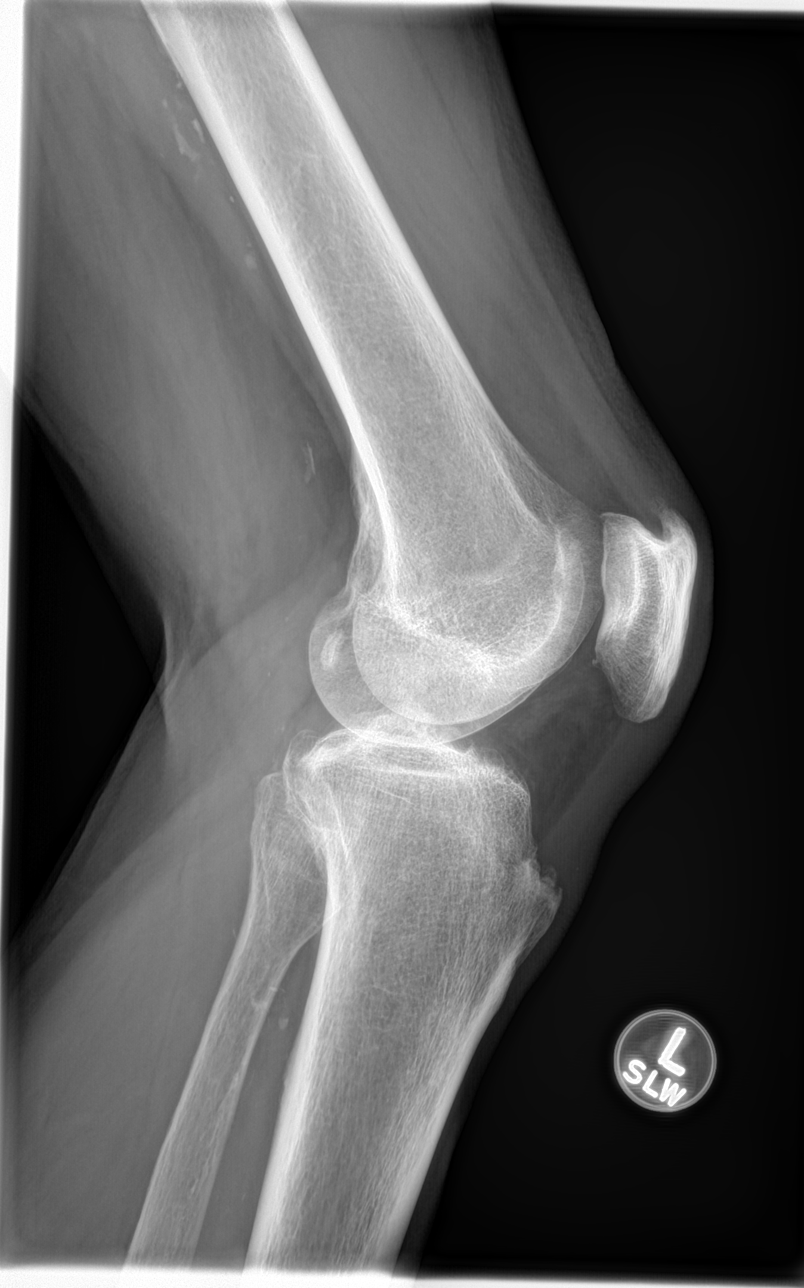

[knee obl]
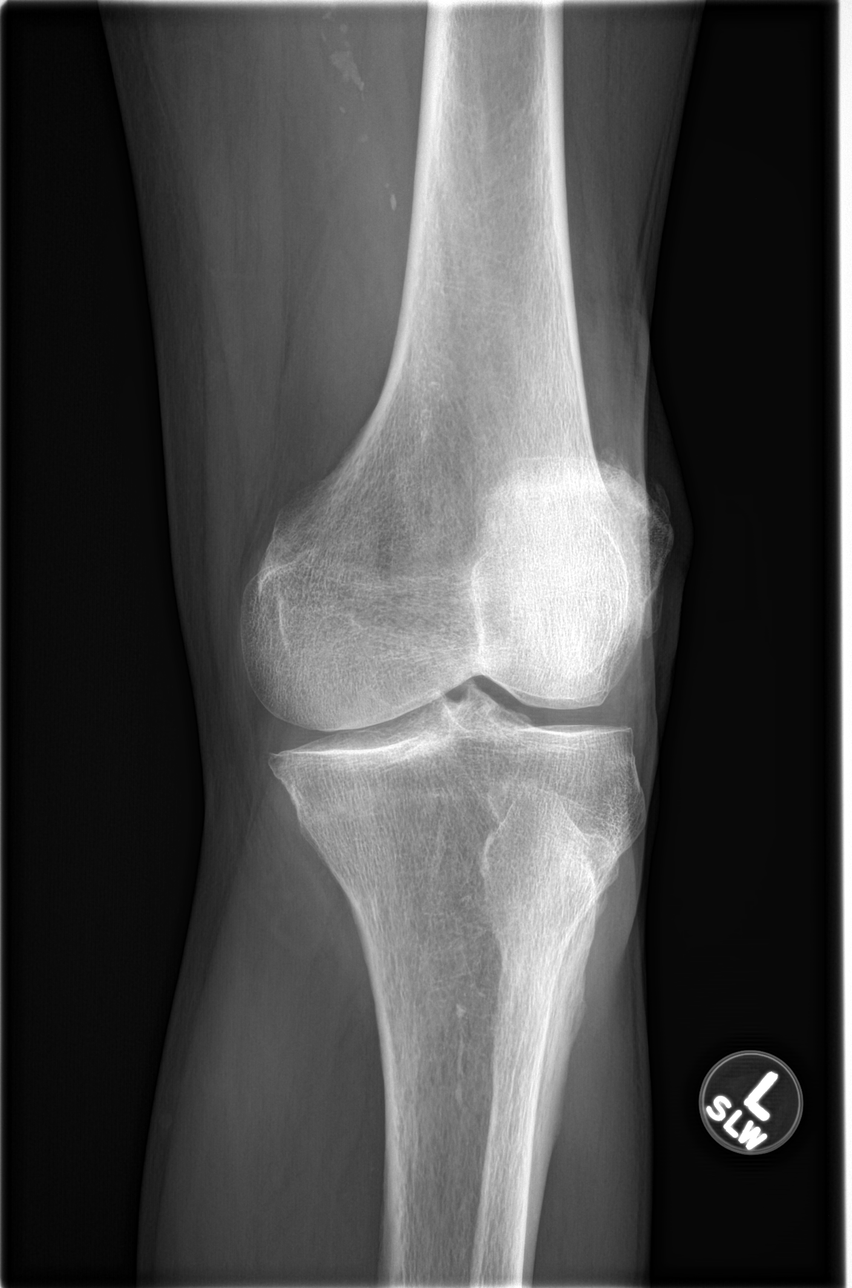

[4 of 4 positions shown; findings below may reference images not displayed]

FINDINGS: Mild tricompartment degenerative changes.

Bony overgrowth at the level of the patellar tendon insertion. Spur
at the level of the quadriceps tendon insertion.

Vascular calcifications.

No fracture or dislocation.
IMPRESSION: Mild tricompartment degenerative changes.

Bony overgrowth at the level of the patellar tendon insertion. Spur
at the level of the quadriceps tendon insertion.

## 2014-10-27 MED ORDER — METFORMIN HCL 500 MG PO TABS: 500.0000 mg | ORAL_TABLET | Freq: Two times a day (BID) | ORAL | Status: DC

## 2014-10-27 NOTE — Progress Notes (Signed)
CC: Tony Banks is a 75 y.o. male is here for Diabetes   Subjective: HPI:  Follow-up type 2 diabetes: He's been eating less strict lately due to the stress of having to take care of his wife who is recovering from a pulmonary embolus. He is also no longer exercising on a daily basis due to left knee pain. He's noticed his blood sugars have been ranging from 100-120 a fasting state. No polyuria plication or polydipsia. No poorly healing wounds  Complains of left knee pain that has been present for about a month and a half now on a daily basis. It's worse first thing in the morning and slowly improved throughout the day. It is described as a pain just below and behind the kneecap. He's having difficulty with what he describes as the coordination of the knee when pain is present and walking. It's given out on him a few times while going up and down stairs. No falls as of yet. He denies any swelling redness or warmth of the knee. No interventions as of yet other than quitting his exercise routine with no improvement or worsening of his pain. He is uncertain about locking or catching but this does sound like his description of "coordination difficulty". Denies weakness in the left lower extremity   Review Of Systems Outlined In HPI  Past Medical History  Diagnosis Date  . Diabetes mellitus without complication   . Constipation     severe  . Nephrolithiasis     Past Surgical History  Procedure Laterality Date  . Fracture surgery    . Vasectomy    . Shoulder arthroscopy      bilat   Family History  Problem Relation Age of Onset  . Heart disease Mother   . Stroke Mother   . Cancer Father   . Stroke Maternal Grandfather   . Colon cancer Neg Hx   . Pancreatic cancer Neg Hx   . Rectal cancer Neg Hx   . Stomach cancer Neg Hx     History   Social History  . Marital Status: Married    Spouse Name: N/A  . Number of Children: N/A  . Years of Education: N/A   Occupational History  .  Not on file.   Social History Main Topics  . Smoking status: Former Research scientist (life sciences)  . Smokeless tobacco: Never Used     Comment: quit 30 yrs ago  . Alcohol Use: No  . Drug Use: No  . Sexual Activity: No   Other Topics Concern  . Not on file   Social History Narrative     Objective: BP 158/78 mmHg  Pulse 90  Wt 142 lb (64.411 kg)  General: Alert and Oriented, No Acute Distress HEENT: Pupils equal, round, reactive to light. Conjunctivae clear.  Moist mucous membranes Lungs: clear and comfortable work of breathing Cardiac: Regular rate and rhythm.  Left knee exam shows full-strength and range of motion. There is no swelling, redness, nor warmth overlying the knee.  No patellar crepitus. No patellar apprehension. No pain with palpation of the inferior patellar pole.  No pain or laxity with valgus nor varus stress. Anterior drawer is negative. . No popliteal space tenderness or palpable mass. No medial or lateral joint line tenderness to palpation. Extremities: No peripheral edema.  Strong peripheral pulses.  Mental Status: No depression, anxiety, nor agitation. Skin: Warm and dry.  Assessment & Plan: Tony Banks was seen today for diabetes.  Diagnoses and all orders for this visit:  Type 2 diabetes mellitus without complication Orders: -     POCT HgB A1C -     POCT UA - Microalbumin -     metFORMIN (GLUCOPHAGE) 500 MG tablet; Take 1 tablet (500 mg total) by mouth 2 (two) times daily with a meal.  Essential hypertension  Left knee pain Orders: -     DG Knee Complete 4 Views Left; Future   Type 2 diabetes: A1c of 6.2 today, controlled continue metformin. Urine microalbumin normal today. Essential hypertension: pointed out to him that on multiple visits his blood pressure has been significantly elevated. I've advised him to start on lisinopril which will also help with preservation of renal function. He politely declines and states he does not want to take more medication Left knee pain:  Suspect meniscal injury will rule out other joint disease with x-rays.   Return in about 3 months (around 01/25/2015) for sugar.

## 2015-01-26 ENCOUNTER — Ambulatory Visit (INDEPENDENT_AMBULATORY_CARE_PROVIDER_SITE_OTHER): Payer: Commercial Managed Care - HMO | Admitting: Family Medicine

## 2015-01-26 ENCOUNTER — Encounter: Payer: Self-pay | Admitting: Family Medicine

## 2015-01-26 VITALS — BP 130/66 | HR 83 | Wt 139.0 lb

## 2015-01-26 DIAGNOSIS — E119 Type 2 diabetes mellitus without complications: Secondary | ICD-10-CM | POA: Diagnosis not present

## 2015-01-26 DIAGNOSIS — K5900 Constipation, unspecified: Secondary | ICD-10-CM | POA: Diagnosis not present

## 2015-01-26 DIAGNOSIS — I10 Essential (primary) hypertension: Secondary | ICD-10-CM | POA: Diagnosis not present

## 2015-01-26 LAB — POCT GLYCOSYLATED HEMOGLOBIN (HGB A1C): Hemoglobin A1C: 6.2

## 2015-01-26 MED ORDER — METFORMIN HCL 500 MG PO TABS: 500.0000 mg | ORAL_TABLET | Freq: Two times a day (BID) | ORAL | Status: DC

## 2015-01-26 NOTE — Progress Notes (Signed)
CC: Tony Banks is a 74 y.o. male is here for Diabetes   Subjective: HPI:   follow-p type 2 diabetes: Continues to take metformin twice a day without any known side effects or intolerance.  Fasting blood sugar ranging between 100-120. Never sees any postprandials about 150. Blood sugar was worsened when taking glucosamine however has come back down to his baseline since stopping this medication weeks ago.  Denies polyuria polyphagia polydipsia nor  Poorly healing wounds   follow-up essential hypertension: Since I saw him last has been trying to eat less sodium. No chest pain shortness of breath orthopnea nor peripheral edema.  No Outside blood pressures to report   follow constipation: He's been taking fiber supplements on a daily basis with daily predictable painless bowel movements that are not difficult to pass. He denies any abdominal pain bloating or nausea.   Review Of Systems Outlined In HPI  Past Medical History  Diagnosis Date  . Diabetes mellitus without complication   . Constipation     severe  . Nephrolithiasis     Past Surgical History  Procedure Laterality Date  . Fracture surgery    . Vasectomy    . Shoulder arthroscopy      bilat   Family History  Problem Relation Age of Onset  . Heart disease Mother   . Stroke Mother   . Cancer Father   . Stroke Maternal Grandfather   . Colon cancer Neg Hx   . Pancreatic cancer Neg Hx   . Rectal cancer Neg Hx   . Stomach cancer Neg Hx     History   Social History  . Marital Status: Married    Spouse Name: N/A  . Number of Children: N/A  . Years of Education: N/A   Occupational History  . Not on file.   Social History Main Topics  . Smoking status: Former Research scientist (life sciences)  . Smokeless tobacco: Never Used     Comment: quit 30 yrs ago  . Alcohol Use: No  . Drug Use: No  . Sexual Activity: No   Other Topics Concern  . Not on file   Social History Narrative     Objective: BP 130/66 mmHg  Pulse 83  Wt 139 lb  (63.05 kg)  General: Alert and Oriented, No Acute Distress HEENT: Pupils equal, round, reactive to light. Conjunctivae clear.   Moist mucous membranes Lungs: Clear to auscultation bilaterally, no wheezing/ronchi/rales.  Comfortable work of breathing. Good air movement. Cardiac: Regular rate and rhythm. Normal S1/S2.  No murmurs, rubs, nor gallops.   Abdomen:  Soft and flat Extremities: No peripheral edema.  Strong peripheral pulses.  Mental Status: No depression, anxiety, nor agitation. Skin: Warm and dry.  Assessment & Plan: Tony Banks was seen today for diabetes.  Diagnoses and all orders for this visit:  Type 2 diabetes mellitus without complication Orders: -     POCT HgB A1C -     metFORMIN (GLUCOPHAGE) 500 MG tablet; Take 1 tablet (500 mg total) by mouth 2 (two) times daily with a meal.  Essential hypertension  Constipation, unspecified constipation type    type 2 diabetes: A1c 6.2, controlled, continue metformin twice a day  Essential hypertension: Controlled with diet and exercise interventions continue to reduce sodium in the diet  Constipation: controlled and resolved with daily fiber supplements  Return for 2-3 months for Annual Wellness Exam.

## 2015-04-06 ENCOUNTER — Ambulatory Visit (INDEPENDENT_AMBULATORY_CARE_PROVIDER_SITE_OTHER): Payer: Commercial Managed Care - HMO | Admitting: Family Medicine

## 2015-04-06 ENCOUNTER — Encounter: Payer: Self-pay | Admitting: Family Medicine

## 2015-04-06 VITALS — BP 136/77 | HR 93 | Ht 68.0 in | Wt 137.0 lb

## 2015-04-06 DIAGNOSIS — E119 Type 2 diabetes mellitus without complications: Secondary | ICD-10-CM | POA: Diagnosis not present

## 2015-04-06 DIAGNOSIS — Z0000 Encounter for general adult medical examination without abnormal findings: Secondary | ICD-10-CM

## 2015-04-06 DIAGNOSIS — H9193 Unspecified hearing loss, bilateral: Secondary | ICD-10-CM

## 2015-04-06 DIAGNOSIS — H9190 Unspecified hearing loss, unspecified ear: Secondary | ICD-10-CM | POA: Insufficient documentation

## 2015-04-06 LAB — LIPID PANEL
Cholesterol: 165 mg/dL (ref 125–200)
HDL: 39 mg/dL — AB (ref >=40)
LDL Cholesterol: 107 mg/dL (ref <130)
TRIGLYCERIDES: 93 mg/dL (ref <150)
Total CHOL/HDL Ratio: 4.2 Ratio (ref <=5.0)
VLDL: 19 mg/dL (ref <30)

## 2015-04-06 LAB — COMPLETE METABOLIC PANEL WITH GFR
ALBUMIN: 4.6 g/dL (ref 3.6–5.1)
ALT: 12 U/L (ref 9–46)
AST: 16 U/L (ref 10–35)
Alkaline Phosphatase: 63 U/L (ref 40–115)
BILIRUBIN TOTAL: 0.9 mg/dL (ref 0.2–1.2)
BUN: 15 mg/dL (ref 7–25)
CALCIUM: 9.5 mg/dL (ref 8.6–10.3)
CO2: 27 mmol/L (ref 20–31)
CREATININE: 0.82 mg/dL (ref 0.70–1.18)
Chloride: 105 mmol/L (ref 98–110)
GFR, EST NON AFRICAN AMERICAN: 88 mL/min (ref >=60)
GFR, Est African American: >89 mL/min (ref >=60)
GLUCOSE: 110 mg/dL — AB (ref 65–99)
POTASSIUM: 4.6 mmol/L (ref 3.5–5.3)
Sodium: 141 mmol/L (ref 135–146)
TOTAL PROTEIN: 6.9 g/dL (ref 6.1–8.1)

## 2015-04-06 LAB — CBC
HCT: 39.9 % (ref 39.0–52.0)
HEMOGLOBIN: 13.8 g/dL (ref 13.0–17.0)
MCH: 30.7 pg (ref 26.0–34.0)
MCHC: 34.6 g/dL (ref 30.0–36.0)
MCV: 88.7 fL (ref 78.0–100.0)
MPV: 9.9 fL (ref 8.6–12.4)
Platelets: 210 10*3/uL (ref 150–400)
RBC: 4.5 MIL/uL (ref 4.22–5.81)
RDW: 13.9 % (ref 11.5–15.5)
WBC: 10.7 10*3/uL — ABNORMAL HIGH (ref 4.0–10.5)

## 2015-04-06 LAB — HEMOGLOBIN A1C
HEMOGLOBIN A1C: 6.1 % — AB (ref <5.7)
MEAN PLASMA GLUCOSE: 128 mg/dL — AB (ref <117)

## 2015-04-06 NOTE — Progress Notes (Signed)
Subjective:    Tony Banks is a 74 y.o. male who presents for Medicare Annual/Subsequent preventive examination.   Preventive Screening-Counseling & Management  Tobacco History  Smoking status  . Former Smoker  Smokeless tobacco  . Never Used    Comment: quit 30 yrs ago   Colonoscopy: Sept 2015, UTD Prostate: Discussed screening risks/beneifts with patient today, obtaining PSA  Influenza Vaccine: No current indication Pneumovax: Will need Prevnar after the end of this month Td/Tdap: He believes it's been well with the last 10 years since his last tetanus booster Zoster: Declined    Problems Prior to Visit 1. Type 2 diabetes  Current Problems (verified) Patient Active Problem List   Diagnosis Date Noted  . Hearing loss 04/06/2015  . Essential hypertension 10/27/2014  . Abdominal pain 04/26/2014  . Constipation 04/26/2014  . Leukocytosis 04/26/2014  . Diabetes mellitus 04/26/2014    Medications Prior to Visit Current Outpatient Prescriptions on File Prior to Visit  Medication Sig Dispense Refill  . metFORMIN (GLUCOPHAGE) 500 MG tablet Take 1 tablet (500 mg total) by mouth 2 (two) times daily with a meal. 60 tablet 5  . AMBULATORY NON FORMULARY MEDICATION Accu-chek aviva plus test strips and accu-chek softclix lancets, use to check blood sugar up to four times a day. Dx Type 2 diabetes. 100 Units 3   No current facility-administered medications on file prior to visit.    Current Medications (verified) Current Outpatient Prescriptions  Medication Sig Dispense Refill  . metFORMIN (GLUCOPHAGE) 500 MG tablet Take 1 tablet (500 mg total) by mouth 2 (two) times daily with a meal. 60 tablet 5  . AMBULATORY NON FORMULARY MEDICATION Accu-chek aviva plus test strips and accu-chek softclix lancets, use to check blood sugar up to four times a day. Dx Type 2 diabetes. 100 Units 3   No current facility-administered medications for this visit.     Allergies  (verified) Shellfish allergy   PAST HISTORY  Family History Family History  Problem Relation Age of Onset  . Heart disease Mother   . Stroke Mother   . Cancer Father   . Stroke Maternal Grandfather   . Colon cancer Neg Hx   . Pancreatic cancer Neg Hx   . Rectal cancer Neg Hx   . Stomach cancer Neg Hx     Social History History  Substance Use Topics  . Smoking status: Former Research scientist (life sciences)  . Smokeless tobacco: Never Used     Comment: quit 30 yrs ago  . Alcohol Use: No    Are there smokers in your home (other than you)?  No  Risk Factors Current exercise habits: Home exercise routine includes walking.  Dietary issues discussed: DASH   Cardiac risk factors: advanced age (older than 28 for men, 69 for women), diabetes mellitus and hypertension.  Depression Screen (Note: if answer to either of the following is "Yes", a more complete depression screening is indicated)   Q1: Over the past two weeks, have you felt down, depressed or hopeless? No  Q2: Over the past two weeks, have you felt little interest or pleasure in doing things? No  Have you lost interest or pleasure in daily life? No  Do you often feel hopeless? No  Do you cry easily over simple problems? No  Activities of Daily Living In your present state of health, do you have any difficulty performing the following activities?:  Driving? No Managing money?  No Feeding yourself? No Getting from bed to chair? No Climbing a flight  of stairs? No Preparing food and eating?: No Bathing or showering? No Getting dressed: No Getting to the toilet? No Using the toilet:No Moving around from place to place: No In the past year have you fallen or had a near fall?:No   Are you sexually active?  No  Do you have more than one partner?  No  Hearing Difficulties: No Do you often ask people to speak up or repeat themselves? No Do you experience ringing or noises in your ears? No Do you have difficulty understanding soft or  whispered voices? No   Do you feel that you have a problem with memory? No  Do you often misplace items? No  Do you feel safe at home?  No  Cognitive Testing  Alert? No  Normal Appearance?Yes  Oriented to person? Yes  Place? Yes   Time? Yes  Recall of three objects?  Yes  Can perform simple calculations? Yes  Displays appropriate judgment?Yes  Can read the correct time from a watch face?Yes   Advanced Directives have been discussed with the patient? Yes   List the Names of Other Physician/Practitioners you currently use: 1.    Indicate any recent Medical Services you may have received from other than [facility] providers in the past year (date may be approximate).  Immunization History  Administered Date(s) Administered  . Pneumococcal Polysaccharide-23 04/27/2014    Screening Tests Health Maintenance  Topic Date Due  . FOOT EXAM  11/19/1950  . OPHTHALMOLOGY EXAM  11/19/1950  . INFLUENZA VACCINE  04/06/2015  . TETANUS/TDAP  04/05/2025 (Originally 11/18/1959)  . ZOSTAVAX  04/06/2035 (Originally 11/18/2001)  . PNA vac Low Risk Adult (2 of 2 - PCV13) 04/28/2015  . HEMOGLOBIN A1C  07/29/2015  . URINE MICROALBUMIN  10/28/2015  . COLONOSCOPY  05/22/2024    All answers were reviewed with the patient and necessary referrals were made:  Marcial Pacas, DO   04/06/2015   History reviewed: allergies, current medications, past family history, past medical history, past social history, past surgical history and problem list  Review of Systems Review of Systems - General ROS: negative for - chills, fever, night sweats, weight gain or weight loss Ophthalmic ROS: negative for - decreased vision Psychological ROS: negative for - anxiety or depression ENT ROS: negative for - hearing change, nasal congestion, tinnitus or allergies Hematological and Lymphatic ROS: negative for - bleeding problems, bruising or swollen lymph nodes Breast ROS: negative Respiratory ROS: no cough, shortness of  breath, or wheezing Cardiovascular ROS: no chest pain or dyspnea on exertion Gastrointestinal ROS: no abdominal pain, change in bowel habits, or black or bloody stools Genito-Urinary ROS: negative for - genital discharge, genital ulcers, incontinence or abnormal bleeding from genitals Musculoskeletal ROS: negative for - joint pain or muscle pain Neurological ROS: negative for - headaches or memory loss Dermatological ROS: negative for lumps, mole changes, rash and skin lesion changes   Objective:     Vision by Snellen chart: right eye:20/25, left eye:20/25 Blood pressure 136/77, pulse 93, height 5\' 8"  (1.727 m), weight 137 lb (62.143 kg). Body mass index is 20.84 kg/(m^2).  General: No Acute Distress HEENT: Atraumatic, normocephalic, conjunctivae normal without scleral icterus.  No nasal discharge, unable to hear whispered noises, TMs with good landmarks bilaterally with no middle ear abnormalities, posterior pharynx clear without oral lesions. Neck: Supple, trachea midline, no cervical nor supraclavicular adenopathy. Pulmonary: Clear to auscultation bilaterally without wheezing, rhonchi, nor rales. Cardiac: Regular rate and rhythm.  No murmurs, rubs, nor gallops.  No peripheral edema.  2+ peripheral pulses bilaterally. Abdomen: Bowel sounds normal.  No masses.  Non-tender without rebound.  Negative Murphy's sign. MSK: Grossly intact, no signs of weakness.  Full strength throughout upper and lower extremities.  Full ROM in upper and lower extremities.  No midline spinal tenderness. Neuro: Gait unremarkable, CN II-XII grossly intact.  C5-C6 Reflex 2/4 Bilaterally, L4 Reflex 2/4 Bilaterally.  Cerebellar function intact. Skin: No rashes. Psych: Alert and oriented to person/place/time.  Thought process normal. No anxiety/depression.     Assessment:     Mild to moderate hearing loss however he is not interested in pursuing audiologic evaluation for hearing aid.     Plan:     During the  course of the visit the patient was educated and counseled about appropriate screening and preventive services including:    Prostate cancer screening  Diet review for nutrition referral? No   Patient Instructions (the written plan) was given to the patient.  Medicare Attestation I have personally reviewed: The patient's medical and social history Their use of alcohol, tobacco or illicit drugs Their current medications and supplements The patient's functional ability including ADLs,fall risks, home safety risks, cognitive, and hearing and visual impairment Diet and physical activities Evidence for depression or mood disorders  The patient's weight, height, BMI, and visual acuity have been recorded in the chart.  I have made referrals, counseling, and provided education to the patient based on review of the above and I have provided the patient with a written personalized care plan for preventive services.     Marcial Pacas, DO   04/06/2015

## 2015-04-07 ENCOUNTER — Telehealth: Payer: Self-pay | Admitting: Family Medicine

## 2015-04-07 DIAGNOSIS — E785 Hyperlipidemia, unspecified: Secondary | ICD-10-CM

## 2015-04-07 LAB — PSA: PSA: 0.55 ng/mL (ref <=4.00)

## 2015-04-07 MED ORDER — ATORVASTATIN CALCIUM 10 MG PO TABS: 10.0000 mg | ORAL_TABLET | Freq: Every day | ORAL | Status: DC

## 2015-04-07 NOTE — Telephone Encounter (Signed)
Pt.notified

## 2015-04-07 NOTE — Telephone Encounter (Signed)
Tony Banks, Will you please let patient know that his PSA prostate test remains normal.  His A1c is well controlled.  Kidney function and liver function are normal.  Blood cell counts are stable.  His LDL cholesterol is mildly elevated and the american heart association recommends that all individuals with type two diabetes under the age of 39 should take a cholesterol lowering medication like atorvastatin to help reduce the risk of heart attacks and stroke.  I've sent an Rx of this to [facility] and would recommend f/u in three months.

## 2015-05-25 ENCOUNTER — Telehealth: Payer: Self-pay | Admitting: *Deleted

## 2015-05-25 DIAGNOSIS — E119 Type 2 diabetes mellitus without complications: Secondary | ICD-10-CM

## 2015-05-25 MED ORDER — METFORMIN HCL 500 MG PO TABS: 500.0000 mg | ORAL_TABLET | Freq: Two times a day (BID) | ORAL | Status: DC

## 2015-05-25 NOTE — Telephone Encounter (Signed)
Refill request for 90 day supply of metformin

## 2015-07-08 DIAGNOSIS — H2513 Age-related nuclear cataract, bilateral: Secondary | ICD-10-CM | POA: Diagnosis not present

## 2015-07-08 DIAGNOSIS — E119 Type 2 diabetes mellitus without complications: Secondary | ICD-10-CM | POA: Diagnosis not present

## 2015-07-08 DIAGNOSIS — Z7984 Long term (current) use of oral hypoglycemic drugs: Secondary | ICD-10-CM | POA: Diagnosis not present

## 2015-07-08 DIAGNOSIS — H25043 Posterior subcapsular polar age-related cataract, bilateral: Secondary | ICD-10-CM | POA: Diagnosis not present

## 2015-07-08 LAB — HM DIABETES EYE EXAM

## 2015-07-13 ENCOUNTER — Encounter: Payer: Self-pay | Admitting: Family Medicine

## 2015-07-13 ENCOUNTER — Ambulatory Visit (INDEPENDENT_AMBULATORY_CARE_PROVIDER_SITE_OTHER): Payer: Commercial Managed Care - HMO | Admitting: Family Medicine

## 2015-07-13 VITALS — BP 157/84 | HR 54 | Wt 143.0 lb

## 2015-07-13 DIAGNOSIS — E119 Type 2 diabetes mellitus without complications: Secondary | ICD-10-CM

## 2015-07-13 DIAGNOSIS — I10 Essential (primary) hypertension: Secondary | ICD-10-CM

## 2015-07-13 LAB — POCT GLYCOSYLATED HEMOGLOBIN (HGB A1C): HEMOGLOBIN A1C: 5.6

## 2015-07-13 MED ORDER — METFORMIN HCL 500 MG PO TABS: 500.0000 mg | ORAL_TABLET | Freq: Two times a day (BID) | ORAL | Status: DC

## 2015-07-13 NOTE — Progress Notes (Signed)
CC: Tony Banks is a 74 y.o. male is here for Hyperglycemia   Subjective: HPI:  Follow-up essential hypertension: No current antihypertensive medication, no outside blood pressures to report. No chest pain shortness of breath orthopnea nor peripheral edema. He is exercising 3 times a day approximately 20 minutes on the exercise bike each session. Does not monitor sodium intake.  Follow-up type 2 diabetes: He is trying to minimize his carbohydrate intake. Fasting blood sugars are ranging between 100-130. He denies hypoglycemic episodes. Denies polyuria or polyphagia or polydipsia.   Review Of Systems Outlined In HPI  Past Medical History  Diagnosis Date  . Diabetes mellitus without complication ([place])   . Constipation     severe  . Nephrolithiasis     Past Surgical History  Procedure Laterality Date  . Fracture surgery    . Vasectomy    . Shoulder arthroscopy      bilat   Family History  Problem Relation Age of Onset  . Heart disease Mother   . Stroke Mother   . Cancer Father   . Stroke Maternal Grandfather   . Colon cancer Neg Hx   . Pancreatic cancer Neg Hx   . Rectal cancer Neg Hx   . Stomach cancer Neg Hx     Social History   Social History  . Marital Status: Married    Spouse Name: N/A  . Number of Children: N/A  . Years of Education: N/A   Occupational History  . Not on file.   Social History Main Topics  . Smoking status: Former Research scientist (life sciences)  . Smokeless tobacco: Never Used     Comment: quit 30 yrs ago  . Alcohol Use: No  . Drug Use: No  . Sexual Activity: No   Other Topics Concern  . Not on file   Social History Narrative     Objective: BP 157/84 mmHg  Pulse 54  Wt 143 lb (64.864 kg)  General: Alert and Oriented, No Acute Distress HEENT: Pupils equal, round, reactive to light. Conjunctivae clear.  Moist mucous membranes Lungs: Clear to auscultation bilaterally, no wheezing/ronchi/rales.  Comfortable work of breathing. Good air movement. Cardiac:  Regular rate and rhythm. Normal S1/S2.  No murmurs, rubs, nor gallops.   Extremities: No peripheral edema.  Strong peripheral pulses.  Mental Status: No depression, anxiety, nor agitation. Skin: Warm and dry.  Assessment & Plan: Tony Banks was seen today for hyperglycemia.  Diagnoses and all orders for this visit:  Diabetes mellitus with no complication ([place]) -     POCT HgB A1C  Type 2 diabetes mellitus without complication, without long-term current use of insulin ([facility]) -     metFORMIN (GLUCOPHAGE) 500 MG tablet; Take 1 tablet (500 mg total) by mouth 2 (two) times daily with a meal.  Essential hypertension   Type 2 diabetes: A1c of 5.6, controlled, continue current metformin regimen. Essential hypertension: Uncontrolled chronic condition, discussed methods to reduce sodium intake, follow-up in 3 months and if ineffective would recommend starting lisinopril.  Return in about 3 months (around 10/13/2015).

## 2015-09-10 ENCOUNTER — Telehealth: Payer: Self-pay | Admitting: Family Medicine

## 2015-09-10 DIAGNOSIS — H269 Unspecified cataract: Secondary | ICD-10-CM

## 2015-09-10 NOTE — Telephone Encounter (Signed)
Patient called and is requesting a referral to an eye surgeon for cataract surgery. Thanks

## 2015-09-10 NOTE — Telephone Encounter (Signed)
Please advise 

## 2015-09-11 NOTE — Telephone Encounter (Signed)
Please let patient know that a referral has been placed.

## 2015-10-07 ENCOUNTER — Encounter: Payer: Self-pay | Admitting: Family Medicine

## 2015-10-12 DIAGNOSIS — Z0100 Encounter for examination of eyes and vision without abnormal findings: Secondary | ICD-10-CM | POA: Diagnosis not present

## 2015-10-12 DIAGNOSIS — H2513 Age-related nuclear cataract, bilateral: Secondary | ICD-10-CM | POA: Diagnosis not present

## 2015-10-23 ENCOUNTER — Other Ambulatory Visit: Payer: Self-pay | Admitting: Family Medicine

## 2015-10-26 ENCOUNTER — Ambulatory Visit (INDEPENDENT_AMBULATORY_CARE_PROVIDER_SITE_OTHER): Payer: Commercial Managed Care - HMO | Admitting: Family Medicine

## 2015-10-26 ENCOUNTER — Encounter: Payer: Self-pay | Admitting: Family Medicine

## 2015-10-26 VITALS — BP 156/87 | HR 80 | Wt 144.0 lb

## 2015-10-26 DIAGNOSIS — I10 Essential (primary) hypertension: Secondary | ICD-10-CM | POA: Diagnosis not present

## 2015-10-26 DIAGNOSIS — E119 Type 2 diabetes mellitus without complications: Secondary | ICD-10-CM | POA: Diagnosis not present

## 2015-10-26 LAB — POCT GLYCOSYLATED HEMOGLOBIN (HGB A1C): HEMOGLOBIN A1C: 6

## 2015-10-26 MED ORDER — METFORMIN HCL 500 MG PO TABS: ORAL_TABLET | ORAL | Status: DC

## 2015-10-26 MED ORDER — ATORVASTATIN CALCIUM 10 MG PO TABS: 10.0000 mg | ORAL_TABLET | Freq: Every day | ORAL | Status: DC

## 2015-10-26 MED ORDER — ACCU-CHEK SOFTCLIX LANCETS MISC: Status: AC

## 2015-10-26 NOTE — Progress Notes (Signed)
CC: Tony Banks is a 75 y.o. male is here for Hyperglycemia and Hypertension   Subjective: HPI:   follow-up hypertension: Continues to work out most of the week for at least an hour's day. Has tried his best to reduce sodium in the diet. Denies chest pain shortness of breath orthopnea or peripheral edema. No outside blood pressures to report.  Follow-up type 2 diabetes: Currently taking metformin twice a day without any side effects. Denies constipation, diarrhea or polyuria. Has had some worsening vision loss however is getting cataract surgery on Wednesday this week. No outside blood sugars to report.   Review Of Systems Outlined In HPI  Past Medical History  Diagnosis Date  . Diabetes mellitus without complication ([place])   . Constipation     severe  . Nephrolithiasis     Past Surgical History  Procedure Laterality Date  . Fracture surgery    . Vasectomy    . Shoulder arthroscopy      bilat   Family History  Problem Relation Age of Onset  . Heart disease Mother   . Stroke Mother   . Cancer Father   . Stroke Maternal Grandfather   . Colon cancer Neg Hx   . Pancreatic cancer Neg Hx   . Rectal cancer Neg Hx   . Stomach cancer Neg Hx     Social History   Social History  . Marital Status: Married    Spouse Name: N/A  . Number of Children: N/A  . Years of Education: N/A   Occupational History  . Not on file.   Social History Main Topics  . Smoking status: Former Research scientist (life sciences)  . Smokeless tobacco: Never Used     Comment: quit 30 yrs ago  . Alcohol Use: No  . Drug Use: No  . Sexual Activity: No   Other Topics Concern  . Not on file   Social History Narrative     Objective: BP 156/87 mmHg  Pulse 80  Wt 144 lb (65.318 kg)  General: Alert and Oriented, No Acute Distress HEENT: Pupils equal, round, reactive to light. Conjunctivae clear. Moist mucous membranes Lungs: Clear to auscultation bilaterally, no wheezing/ronchi/rales.  Comfortable work of breathing. Good  air movement. Cardiac: Regular rate and rhythm. Normal S1/S2.  No murmurs, rubs, nor gallops.   Extremities: No peripheral edema.  Strong peripheral pulses.  Mental Status: No depression, anxiety, nor agitation. Skin: Warm and dry.  Assessment & Plan: Tony Banks was seen today for hyperglycemia and hypertension.  Diagnoses and all orders for this visit:  Essential hypertension  Type 2 diabetes mellitus without complication, without long-term current use of insulin ([facility]) -     POCT HgB A1C  Other orders -     ACCU-CHEK SOFTCLIX LANCETS lancets; Use as instructed -     metFORMIN (GLUCOPHAGE) 500 MG tablet; TAKE 1 TABLET (500 MG TOTAL) BY MOUTH 2 (TWO) TIMES DAILY WITH A MEAL. -     atorvastatin (LIPITOR) 10 MG tablet; Take 1 tablet (10 mg total) by mouth daily.   Essential hypertension: uncontrolled chronic condition, encouraged to start on lisinopril however politely declines. Type 2 diabetes: A1c of 6.0, controlled, continue current dose of metformin  Return in about 3 months (around 01/23/2016).

## 2015-10-28 DIAGNOSIS — H25812 Combined forms of age-related cataract, left eye: Secondary | ICD-10-CM | POA: Diagnosis not present

## 2015-10-28 DIAGNOSIS — H2512 Age-related nuclear cataract, left eye: Secondary | ICD-10-CM | POA: Diagnosis not present

## 2015-11-13 ENCOUNTER — Ambulatory Visit: Payer: Commercial Managed Care - HMO | Admitting: Family Medicine

## 2015-12-09 DIAGNOSIS — H25811 Combined forms of age-related cataract, right eye: Secondary | ICD-10-CM | POA: Diagnosis not present

## 2015-12-09 DIAGNOSIS — H2511 Age-related nuclear cataract, right eye: Secondary | ICD-10-CM | POA: Diagnosis not present

## 2016-01-25 ENCOUNTER — Ambulatory Visit (INDEPENDENT_AMBULATORY_CARE_PROVIDER_SITE_OTHER): Payer: Commercial Managed Care - HMO | Admitting: Family Medicine

## 2016-01-25 ENCOUNTER — Encounter: Payer: Self-pay | Admitting: Family Medicine

## 2016-01-25 VITALS — BP 136/78 | HR 89 | Ht 68.0 in | Wt 150.0 lb

## 2016-01-25 DIAGNOSIS — I10 Essential (primary) hypertension: Secondary | ICD-10-CM

## 2016-01-25 DIAGNOSIS — E119 Type 2 diabetes mellitus without complications: Secondary | ICD-10-CM | POA: Diagnosis not present

## 2016-01-25 DIAGNOSIS — E785 Hyperlipidemia, unspecified: Secondary | ICD-10-CM

## 2016-01-25 LAB — POCT GLYCOSYLATED HEMOGLOBIN (HGB A1C): HEMOGLOBIN A1C: 6.5

## 2016-01-25 NOTE — Progress Notes (Signed)
CC: Tony Banks is a 75 y.o. male is here for Follow-up   Subjective: HPI:  Follow-up essential hypertension: At the last visit he elected not to start on lisinopril. He is trying his best to reduce sodium in his diet. No outside blood pressures to report. Denies chest pain shortness of breath orthopnea or peripheral edema.  Follow-up type 2 diabetes: He is taking metformin twice a day with 100% compliance. He forgets the exact numbers of his blood sugars but they went up over the past few months because he is not exercising with his stationary bike like he used to. He denies any pain it's just due to self-admitted laziness. Denies polyuria plication or polydipsia  Follow hyperlipidemia: Taking Percocet on a daily basis without right upper quadrant pain or myalgias. He denies leg claudication.   Review Of Systems Outlined In HPI  Past Medical History  Diagnosis Date  . Diabetes mellitus without complication ([place])   . Constipation     severe  . Nephrolithiasis     Past Surgical History  Procedure Laterality Date  . Fracture surgery    . Vasectomy    . Shoulder arthroscopy      bilat   Family History  Problem Relation Age of Onset  . Heart disease Mother   . Stroke Mother   . Cancer Father   . Stroke Maternal Grandfather   . Colon cancer Neg Hx   . Pancreatic cancer Neg Hx   . Rectal cancer Neg Hx   . Stomach cancer Neg Hx     Social History   Social History  . Marital Status: Married    Spouse Name: N/A  . Number of Children: N/A  . Years of Education: N/A   Occupational History  . Not on file.   Social History Main Topics  . Smoking status: Former Research scientist (life sciences)  . Smokeless tobacco: Never Used     Comment: quit 30 yrs ago  . Alcohol Use: No  . Drug Use: No  . Sexual Activity: No   Other Topics Concern  . Not on file   Social History Narrative     Objective: BP 136/78 mmHg  Pulse 89  Ht 5\' 8"  (1.727 m)  Wt 150 lb (68.04 kg)  BMI 22.81 kg/m2  General:  Alert and Oriented, No Acute Distress HEENT: Pupils equal, round, reactive to light. Conjunctivae clear.  Moist mucous membranes Lungs: Clear to auscultation bilaterally, no wheezing/ronchi/rales.  Comfortable work of breathing. Good air movement. Cardiac: Regular rate and rhythm. Normal S1/S2.  No murmurs, rubs, nor gallops.   Extremities: No peripheral edema.  Strong peripheral pulses.  Mental Status: No depression, anxiety, nor agitation. Skin: Warm and dry.  Assessment & Plan: Tony Banks was seen today for follow-up.  Diagnoses and all orders for this visit:  Essential hypertension  Type 2 diabetes mellitus without complication, without long-term current use of insulin ([facility]) -     POCT HgB A1C  Hyperlipidemia   Essential hypertension: In prehypertensive range, applauded his ability to lower his blood pressure with sodium restriction. Type 2 diabetes: Controlled with an A1c of 6.5, continue metformin twice a day encouraged to restart his exercise regimen. Hyperlipidemia: Clinically controlled, he'll be due for repeat lipid panel in 3 months.  Return in about 3 months (around 04/26/2016) for CPE.

## 2016-04-26 ENCOUNTER — Ambulatory Visit (INDEPENDENT_AMBULATORY_CARE_PROVIDER_SITE_OTHER): Payer: Commercial Managed Care - HMO | Admitting: Family Medicine

## 2016-04-26 ENCOUNTER — Encounter: Payer: Self-pay | Admitting: Family Medicine

## 2016-04-26 VITALS — BP 148/75 | HR 78 | Wt 150.0 lb

## 2016-04-26 DIAGNOSIS — Z23 Encounter for immunization: Secondary | ICD-10-CM | POA: Diagnosis not present

## 2016-04-26 DIAGNOSIS — I10 Essential (primary) hypertension: Secondary | ICD-10-CM | POA: Diagnosis not present

## 2016-04-26 DIAGNOSIS — E785 Hyperlipidemia, unspecified: Secondary | ICD-10-CM | POA: Diagnosis not present

## 2016-04-26 DIAGNOSIS — Z0000 Encounter for general adult medical examination without abnormal findings: Secondary | ICD-10-CM | POA: Diagnosis not present

## 2016-04-26 DIAGNOSIS — E119 Type 2 diabetes mellitus without complications: Secondary | ICD-10-CM | POA: Diagnosis not present

## 2016-04-26 MED ORDER — LINACLOTIDE 145 MCG PO CAPS: 145.0000 ug | ORAL_CAPSULE | Freq: Every day | ORAL | 2 refills | Status: DC

## 2016-04-26 NOTE — Progress Notes (Signed)
Subjective:    Tony Banks is a 75 y.o. male who presents for Medicare Annual/Subsequent preventive examination.   Preventive Screening-Counseling & Management  Tobacco History  Smoking Status  . Former Smoker  Smokeless Tobacco  . Never Used    Comment: quit 30 yrs ago   Colonoscopy: Sept 2015, UTD Prostate: Discussed screening risks/beneifts with patient today, obtaining PSA  Influenza Vaccine: Declined today Pneumovax: Will need Prevnar today Td/Tdap: He believes it's been well with the last 10 years since his last tetanus booster Zoster: Declined    Problems Prior to Visit 1. Type 2 DM  Current Problems (verified) Patient Active Problem List   Diagnosis Date Noted  . Type 2 diabetes mellitus ([place]) 07/13/2015  . Hyperlipidemia 04/07/2015  . Hearing loss 04/06/2015  . Essential hypertension 10/27/2014  . Abdominal pain 04/26/2014  . Constipation 04/26/2014  . Leukocytosis 04/26/2014    Medications Prior to Visit Current Outpatient Prescriptions on File Prior to Visit  Medication Sig Dispense Refill  . ACCU-CHEK AVIVA PLUS test strip USE TO CHECK BLOOD SUGAR UP TO 4 TIMES A DAY 100 each 3  . ACCU-CHEK SOFTCLIX LANCETS lancets Use as instructed 100 each 12  . AMBULATORY NON FORMULARY MEDICATION Accu-chek aviva plus test strips and accu-chek softclix lancets, use to check blood sugar up to four times a day. Dx Type 2 diabetes. 100 Units 3  . atorvastatin (LIPITOR) 10 MG tablet Take 1 tablet (10 mg total) by mouth daily. 90 tablet 3  . metFORMIN (GLUCOPHAGE) 500 MG tablet TAKE 1 TABLET (500 MG TOTAL) BY MOUTH 2 (TWO) TIMES DAILY WITH A MEAL. 180 tablet 3   No current facility-administered medications on file prior to visit.     Current Medications (verified) Current Outpatient Prescriptions  Medication Sig Dispense Refill  . ACCU-CHEK AVIVA PLUS test strip USE TO CHECK BLOOD SUGAR UP TO 4 TIMES A DAY 100 each 3  . ACCU-CHEK SOFTCLIX LANCETS lancets Use as  instructed 100 each 12  . AMBULATORY NON FORMULARY MEDICATION Accu-chek aviva plus test strips and accu-chek softclix lancets, use to check blood sugar up to four times a day. Dx Type 2 diabetes. 100 Units 3  . atorvastatin (LIPITOR) 10 MG tablet Take 1 tablet (10 mg total) by mouth daily. 90 tablet 3  . metFORMIN (GLUCOPHAGE) 500 MG tablet TAKE 1 TABLET (500 MG TOTAL) BY MOUTH 2 (TWO) TIMES DAILY WITH A MEAL. 180 tablet 3   No current facility-administered medications for this visit.      Allergies (verified) Shellfish allergy   PAST HISTORY  Family History Family History  Problem Relation Age of Onset  . Heart disease Mother   . Stroke Mother   . Cancer Father   . Stroke Maternal Grandfather   . Colon cancer Neg Hx   . Pancreatic cancer Neg Hx   . Rectal cancer Neg Hx   . Stomach cancer Neg Hx     Social History Social History  Substance Use Topics  . Smoking status: Former Research scientist (life sciences)  . Smokeless tobacco: Never Used     Comment: quit 30 yrs ago  . Alcohol use No    Are there smokers in your home (other than you)?  No  Risk Factors Current exercise habits: Home exercise routine includes stationary cycle.  Dietary issues discussed: DASH   Cardiac risk factors: advanced age (older than 6 for men, 37 for women), diabetes mellitus, dyslipidemia and hypertension.  Depression Screen (Note: if answer to either of  the following is "Yes", a more complete depression screening is indicated)   Q1: Over the past two weeks, have you felt down, depressed or hopeless? No  Q2: Over the past two weeks, have you felt little interest or pleasure in doing things? No  Have you lost interest or pleasure in daily life? No  Do you often feel hopeless? No  Do you cry easily over simple problems? No  Activities of Daily Living In your present state of health, do you have any difficulty performing the following activities?:  Driving? No Managing money?  No Feeding yourself? No Getting from  bed to chair? No Climbing a flight of stairs? No Preparing food and eating?: No Bathing or showering? No Getting dressed: No Getting to the toilet? No Using the toilet:No Moving around from place to place: No In the past year have you fallen or had a near fall?:No   Are you sexually active?  No  Do you have more than one partner?  No  Hearing Difficulties: No Do you often ask people to speak up or repeat themselves? No Do you experience ringing or noises in your ears? No Do you have difficulty understanding soft or whispered voices? No   Do you feel that you have a problem with memory? No  Do you often misplace items? No  Do you feel safe at home?  Yes  Cognitive Testing  Alert? Yes  Normal Appearance?Yes  Oriented to person? Yes  Place? Yes   Time? Yes  Recall of three objects?  Yes  Can perform simple calculations? Yes  Displays appropriate judgment?Yes  Can read the correct time from a watch face?Yes   Advanced Directives have been discussed with the patient? Yes   List the Names of Other Physician/Practitioners you currently use: 1.  None  Indicate any recent Medical Services you may have received from other than [facility] providers in the past year (date may be approximate).  Immunization History  Administered Date(s) Administered  . Influenza-Unspecified 07/10/2015  . Pneumococcal Polysaccharide-23 04/27/2014    Screening Tests Health Maintenance  Topic Date Due  . FOOT EXAM  11/19/1950  . PNA vac Low Risk Adult (2 of 2 - PCV13) 04/28/2015  . URINE MICROALBUMIN  10/28/2015  . INFLUENZA VACCINE  04/05/2016  . TETANUS/TDAP  04/05/2025 (Originally 11/18/1959)  . ZOSTAVAX  04/06/2035 (Originally 11/18/2001)  . OPHTHALMOLOGY EXAM  07/07/2016  . HEMOGLOBIN A1C  07/27/2016  . COLONOSCOPY  05/22/2024    All answers were reviewed with the patient and necessary referrals were made:  Marcial Pacas, DO   04/26/2016   History reviewed: allergies, current medications,  past family history, past medical history, past social history, past surgical history and problem list  Review of Systems - General ROS: negative for - chills, fever, night sweats, weight gain or weight loss Ophthalmic ROS: negative for - decreased vision Psychological ROS: negative for - anxiety or depression ENT ROS: negative for - hearing change, nasal congestion, tinnitus or allergies Hematological and Lymphatic ROS: negative for - bleeding problems, bruising or swollen lymph nodes Breast ROS: negative Respiratory ROS: no cough, shortness of breath, or wheezing Cardiovascular ROS: no chest pain or dyspnea on exertion Gastrointestinal ROS: no abdominal pain, change in bowel habits, or black or bloody stools Genito-Urinary ROS: negative for - genital discharge, genital ulcers, incontinence or abnormal bleeding from genitals Musculoskeletal ROS: negative for - joint pain or muscle pain Neurological ROS: negative for - headaches or memory loss Dermatological ROS: negative for  lumps, mole changes, rash and skin lesion changes    Objective:     Vision by Snellen chart: right eye:20/40, left eye:20/30 Blood pressure (!) 148/75, pulse 78, weight 150 lb (68 kg). Body mass index is 22.81 kg/m.  General: No Acute Distress HEENT: Atraumatic, normocephalic, conjunctivae normal without scleral icterus.  No nasal discharge, hearing grossly intact, TMs with good landmarks bilaterally with no middle ear abnormalities, posterior pharynx clear without oral lesions. Neck: Supple, trachea midline, no cervical nor supraclavicular adenopathy. Pulmonary: Clear to auscultation bilaterally without wheezing, rhonchi, nor rales. Cardiac: Regular rate and rhythm.  No murmurs, rubs, nor gallops. No peripheral edema.  2+ peripheral pulses bilaterally. Abdomen: Bowel sounds normal.  No masses.  Non-tender without rebound.  Negative Murphy's sign. MSK: Grossly intact, no signs of weakness.  Full strength  throughout upper and lower extremities.  Full ROM in upper and lower extremities.  No midline spinal tenderness. Neuro: Gait unremarkable, CN II-XII grossly intact.  C5-C6 Reflex 2/4 Bilaterally, L4 Reflex 2/4 Bilaterally.  Cerebellar function intact. Skin: No rashes. Psych: Alert and oriented to person/place/time.  Thought process normal. No anxiety/depression.     Assessment:     Due for Prevnar and routine labs, he would like a refill available for [facility] should he need it in my absence.     Plan:     During the course of the visit the patient was educated and counseled about appropriate screening and preventive services including:    Pneumococcal vaccine   Diet review for nutrition referral? Not Indicated ____  Discussed with this patient that I will be resigning from my position here with [facility] in September in order to stay with my family who will be moving to [facility]. I let him know about the providers that are still accepting patients and I feel that this individual will be under great care if he/she stays here with [facility].  Patient Instructions (the written plan) was given to the patient.  Medicare Attestation I have personally reviewed: The patient's medical and social history Their use of alcohol, tobacco or illicit drugs Their current medications and supplements The patient's functional ability including ADLs,fall risks, home safety risks, cognitive, and hearing and visual impairment Diet and physical activities Evidence for depression or mood disorders  The patient's weight, height, BMI, and visual acuity have been recorded in the chart.  I have made referrals, counseling, and provided education to the patient based on review of the above and I have provided the patient with a written personalized care plan for preventive services.     Marcial Pacas, DO   04/26/2016

## 2016-04-26 NOTE — Addendum Note (Signed)
Addended by: Delrae Alfred on: 04/26/2016 09:49 AM   Modules accepted: Orders

## 2016-04-27 DIAGNOSIS — I10 Essential (primary) hypertension: Secondary | ICD-10-CM | POA: Diagnosis not present

## 2016-04-27 DIAGNOSIS — E785 Hyperlipidemia, unspecified: Secondary | ICD-10-CM | POA: Diagnosis not present

## 2016-04-27 DIAGNOSIS — Z125 Encounter for screening for malignant neoplasm of prostate: Secondary | ICD-10-CM | POA: Diagnosis not present

## 2016-04-27 DIAGNOSIS — E119 Type 2 diabetes mellitus without complications: Secondary | ICD-10-CM | POA: Diagnosis not present

## 2016-04-27 DIAGNOSIS — Z0000 Encounter for general adult medical examination without abnormal findings: Secondary | ICD-10-CM | POA: Diagnosis not present

## 2016-04-27 LAB — CBC
HCT: 39.3 % (ref 38.5–50.0)
Hemoglobin: 13.4 g/dL (ref 13.2–17.1)
MCH: 30.2 pg (ref 27.0–33.0)
MCHC: 34.1 g/dL (ref 32.0–36.0)
MCV: 88.7 fL (ref 80.0–100.0)
MPV: 10.6 fL (ref 7.5–12.5)
Platelets: 185 10*3/uL (ref 140–400)
RBC: 4.43 MIL/uL (ref 4.20–5.80)
RDW: 13.6 % (ref 11.0–15.0)
WBC: 13.5 10*3/uL — ABNORMAL HIGH (ref 3.8–10.8)

## 2016-04-27 LAB — COMPLETE METABOLIC PANEL WITH GFR
ALBUMIN: 4.5 g/dL (ref 3.6–5.1)
ALK PHOS: 60 U/L (ref 40–115)
ALT: 15 U/L (ref 9–46)
AST: 19 U/L (ref 10–35)
BILIRUBIN TOTAL: 0.8 mg/dL (ref 0.2–1.2)
BUN: 21 mg/dL (ref 7–25)
CO2: 24 mmol/L (ref 20–31)
Calcium: 9.4 mg/dL (ref 8.6–10.3)
Chloride: 106 mmol/L (ref 98–110)
Creat: 1.03 mg/dL (ref 0.70–1.18)
GFR, EST AFRICAN AMERICAN: 82 mL/min (ref >=60)
GFR, EST NON AFRICAN AMERICAN: 71 mL/min (ref >=60)
Glucose, Bld: 128 mg/dL — ABNORMAL HIGH (ref 65–99)
Potassium: 4 mmol/L (ref 3.5–5.3)
Sodium: 141 mmol/L (ref 135–146)
TOTAL PROTEIN: 7 g/dL (ref 6.1–8.1)

## 2016-04-27 LAB — PSA: PSA: 1 ng/mL (ref <=4.0)

## 2016-04-27 LAB — LIPID PANEL
CHOL/HDL RATIO: 2.9 ratio (ref <=5.0)
Cholesterol: 110 mg/dL — ABNORMAL LOW (ref 125–200)
HDL: 38 mg/dL — AB (ref >=40)
LDL Cholesterol: 59 mg/dL (ref <130)
TRIGLYCERIDES: 63 mg/dL (ref <150)
VLDL: 13 mg/dL (ref <30)

## 2016-04-28 LAB — HEMOGLOBIN A1C
HEMOGLOBIN A1C: 6.1 % — AB (ref <5.7)
MEAN PLASMA GLUCOSE: 128 mg/dL

## 2016-07-26 ENCOUNTER — Ambulatory Visit (INDEPENDENT_AMBULATORY_CARE_PROVIDER_SITE_OTHER): Payer: Commercial Managed Care - HMO | Admitting: Osteopathic Medicine

## 2016-07-26 ENCOUNTER — Encounter: Payer: Self-pay | Admitting: Osteopathic Medicine

## 2016-07-26 VITALS — BP 146/76 | HR 72 | Ht 68.0 in | Wt 150.0 lb

## 2016-07-26 DIAGNOSIS — H269 Unspecified cataract: Secondary | ICD-10-CM

## 2016-07-26 DIAGNOSIS — K5900 Constipation, unspecified: Secondary | ICD-10-CM

## 2016-07-26 DIAGNOSIS — M25562 Pain in left knee: Secondary | ICD-10-CM

## 2016-07-26 DIAGNOSIS — E785 Hyperlipidemia, unspecified: Secondary | ICD-10-CM | POA: Diagnosis not present

## 2016-07-26 DIAGNOSIS — I10 Essential (primary) hypertension: Secondary | ICD-10-CM | POA: Diagnosis not present

## 2016-07-26 DIAGNOSIS — E119 Type 2 diabetes mellitus without complications: Secondary | ICD-10-CM | POA: Diagnosis not present

## 2016-07-26 LAB — POCT GLYCOSYLATED HEMOGLOBIN (HGB A1C): Hemoglobin A1C: 6.1

## 2016-07-26 MED ORDER — METFORMIN HCL 500 MG PO TABS
500.0000 mg | ORAL_TABLET | Freq: Every day | ORAL | 3 refills | Status: DC
Start: 2016-07-26 — End: 2017-10-27

## 2016-07-26 NOTE — Progress Notes (Signed)
HPI: Tony Banks is a 75 y.o. male  who presents to [facility] today, 07/26/16,  for chief complaint of:  Chief Complaint  Patient presents with  . Establish Care    switch from [place]/ diabetes     Type 2 diabetes: On metformin bid, A1C as below. No hypoglycemic episodes. No home Glc logs at this time.   Hypertension: Noted on problem list, I don't see that patient has ever been on any medications through this clinic.  Hyperlipidemia: On atorvastatin  Leukocytosis: On problem list, mild elevation going back to CBC on 2016.  GI: chronic constipation, was previously on Linzess & doing well, stable, uses this as needed. Last time he needed this medication was about 6 months ago   Cataracts: was told he needed referral, s/p surgery 6 months.   L knee: some pain was bothering him, stopped pain since taking taking fish oil. 20 minutes on exercise bike 4 times per day.   Most recent is reviewed: 04/26/2016 annual physical. Labs reviewed: White blood cells mildly elevated, hemoglobin A1c at that visit was 6.1. PSA normal. Creatinine 1.03 with GFR greater than 60. LDL 59, HDL 38 a bit low  Past medical, surgical, social and family history reviewed: Patient Active Problem List   Diagnosis Date Noted  . Type 2 diabetes mellitus ([place]) 07/13/2015  . Hyperlipidemia 04/07/2015  . Hearing loss 04/06/2015  . Essential hypertension 10/27/2014  . Abdominal pain 04/26/2014  . Constipation 04/26/2014  . Leukocytosis 04/26/2014   Past Surgical History:  Procedure Laterality Date  . FRACTURE SURGERY    . SHOULDER ARTHROSCOPY     bilat  . VASECTOMY     Social History  Substance Use Topics  . Smoking status: Former Research scientist (life sciences)  . Smokeless tobacco: Never Used     Comment: quit 30 yrs ago  . Alcohol use No   Family History  Problem Relation Age of Onset  . Heart disease Mother   . Stroke Mother   . Cancer Father   . Stroke Maternal Grandfather   .  Colon cancer Neg Hx   . Pancreatic cancer Neg Hx   . Rectal cancer Neg Hx   . Stomach cancer Neg Hx      Current medication list and allergy/intolerance information reviewed:   Current Outpatient Prescriptions on File Prior to Visit  Medication Sig Dispense Refill  . ACCU-CHEK AVIVA PLUS test strip USE TO CHECK BLOOD SUGAR UP TO 4 TIMES A DAY 100 each 3  . ACCU-CHEK SOFTCLIX LANCETS lancets Use as instructed 100 each 12  . AMBULATORY NON FORMULARY MEDICATION Accu-chek aviva plus test strips and accu-chek softclix lancets, use to check blood sugar up to four times a day. Dx Type 2 diabetes. 100 Units 3  . atorvastatin (LIPITOR) 10 MG tablet Take 1 tablet (10 mg total) by mouth daily. 90 tablet 3  . linaclotide (LINZESS) 145 MCG CAPS capsule Take 1 capsule (145 mcg total) by mouth daily before breakfast. 30 capsule 2  . metFORMIN (GLUCOPHAGE) 500 MG tablet TAKE 1 TABLET (500 MG TOTAL) BY MOUTH 2 (TWO) TIMES DAILY WITH A MEAL. 180 tablet 3   No current facility-administered medications on file prior to visit.    Allergies  Allergen Reactions  . Shellfish Allergy Other (See Comments)    Leg swelling       Review of Systems:  Constitutional: No recent illness  HEENT: No  headache, no vision change  Cardiac: No  chest  pain, No  pressure, No palpitations  Respiratory:  No  shortness of breath.   Gastrointestinal: No  abdominal pain, no change on bowel habits  Musculoskeletal: No new myalgia/arthralgia  Skin: No  Rash  Neurologic: No  weakness  Exam:  BP (!) 146/76   Pulse 72   Ht 5\' 8"  (1.727 m)   Wt 150 lb (68 kg)   BMI 22.81 kg/m   Constitutional: VS see above. General Appearance: alert, well-developed, well-nourished, NAD  Eyes: Normal lids and conjunctive, non-icteric sclera  Ears, Nose, Mouth, Throat: MMM, Normal external inspection ears/nares/mouth/lips/gums.  Neck: No masses, trachea midline.   Respiratory: Normal respiratory effort. no wheeze, no rhonchi,  no rales  Cardiovascular: S1/S2 normal, no murmur, no rub/gallop auscultated. RRR.   Musculoskeletal: Gait normal. Symmetric and independent movement of all extremities  Neurological: Normal balance/coordination. No tremor.  Skin: warm, dry, intact.   Psychiatric: Normal judgment/insight. Normal mood and affect. Oriented x3.    Results for orders placed or performed in visit on 07/26/16 (from the past 72 hour(s))  POCT HgB A1C     Status: None   Collection Time: 07/26/16  8:40 AM  Result Value Ref Range   Hemoglobin A1C 6.1       ASSESSMENT/PLAN:   Diabetes mellitus without complication (HCC) - 123456 is well below goal, can decrease metformin to once per day and recheck A1c in 3 months - Plan: POCT HgB A1C, metFORMIN (GLUCOPHAGE) 500 MG tablet  Hyperlipidemia, unspecified hyperlipidemia type - Continue current medications and recheck labs when due for annual  Essential hypertension - Patient is cautious about blood pressure medications, recheck shows improvement. Keep close eye on this, educated on cardiac risk  Constipation, unspecified constipation type - Intermittent, patient has Linzess on hand as needed  Cataract of both eyes, unspecified cataract type - For some reason ophthalmology needs referral for establish patient, this was provided - Plan: Ambulatory referral to Ophthalmology  Left knee pain, unspecified chronicity - Resolved with home exercises and fish trial, let us know if recurs    Patient Instructions  We are lowering the Metformin to once per day and rechecking A1c in 3 months.     Visit summary with medication list and pertinent instructions was printed for patient to review. All questions at time of visit were answered - patient instructed to contact office with any additional concerns. ER/RTC precautions were reviewed with the patient. Follow-up plan: Return for DIABATES RECHECK .

## 2016-07-26 NOTE — Patient Instructions (Signed)
We are lowering the Metformin to once per day and rechecking A1c in 3 months.

## 2016-08-02 DIAGNOSIS — Z961 Presence of intraocular lens: Secondary | ICD-10-CM | POA: Diagnosis not present

## 2016-08-02 DIAGNOSIS — H5203 Hypermetropia, bilateral: Secondary | ICD-10-CM | POA: Diagnosis not present

## 2016-08-18 ENCOUNTER — Encounter: Payer: Self-pay | Admitting: Family Medicine

## 2016-08-18 ENCOUNTER — Ambulatory Visit (INDEPENDENT_AMBULATORY_CARE_PROVIDER_SITE_OTHER): Payer: Commercial Managed Care - HMO | Admitting: Family Medicine

## 2016-08-18 VITALS — BP 154/81 | HR 88 | Temp 97.7°F | Wt 150.0 lb

## 2016-08-18 DIAGNOSIS — H0014 Chalazion left upper eyelid: Secondary | ICD-10-CM

## 2016-08-18 MED ORDER — POLYMYXIN B-TRIMETHOPRIM 10000-0.1 UNIT/ML-% OP SOLN: 2.0000 [drp] | OPHTHALMIC | 0 refills | Status: DC

## 2016-08-18 NOTE — Patient Instructions (Signed)
Thank you for coming in today. Follow up with your eye doctor soon.  USe the drops and continue warm compress.   Chalazion Introduction A chalazion is a swelling or lump on the eyelid. It can affect the upper or lower eyelid. What are the causes? This condition may be caused by:  Long-lasting (chronic) inflammation of the eyelid glands.  A blocked oil gland in the eyelid. What are the signs or symptoms? Symptoms of this condition include:  A swelling on the eyelid. The swelling may spread to areas around the eye.  A hard lump on the eyelid. This lump may make it hard to see out of the eye. How is this diagnosed? This condition is diagnosed with an examination of the eye. How is this treated? This condition is treated by applying a warm compress to the eyelid. If the condition does not improve after two days, it may be treated with:  Surgery.  Medicine that is injected into the chalazion by a health care provider.  Medicine that is applied to the eye. Follow these instructions at home:  Do not touch the chalazion.  Do not try to remove the pus, such as by squeezing the chalazion or sticking it with a pin or needle.  Do not rub your eyes.  Wash your hands often. Dry your hands with a clean towel.  Keep your face, scalp, and eyebrows clean.  Avoid wearing eye makeup.  Apply a warm, moist compress to the eyelid 4-6 times a day for 10-15 minutes at a time. This will help to open any blocked glands and help to reduce redness and swelling.  Apply over-the-counter and prescription medicines only as told by your health care provider.  If the chalazion does not break open (rupture) on its own in a month, return to your health care provider.  Keep all follow-up appointments as told by your health care provider. This is important. Contact a health care provider if:  Your eyelid has not improved in 4 weeks.  Your eyelid is getting worse.  You have a fever.  The chalazion  does not rupture on its own with home treatment in a month. Get help right away if:  You have pain in your eye.  Your vision changes.  The chalazion becomes painful or red  The chalazion gets bigger. This information is not intended to replace advice given to you by your health care provider. Make sure you discuss any questions you have with your health care provider. Document Released: 08/19/2000 Document Revised: 01/28/2016 Document Reviewed: 12/15/2014  2017 Elsevier

## 2016-08-18 NOTE — Progress Notes (Signed)
   Tony Banks is a 75 y.o. male who presents to [facility] today for left eyelid swelling.  Patient has had left eyelid swelling for the past 2 weeks. Initially, there was swelling around his right eyebrow and cheekbone areas, but it has since been localized to the eyelid. It is itchy but not painful. He has had a stye before and says this feels different because it involves his eyelid more. He has tried warm compresses 3-4x/day for the past few days that has decreased swelling somewhat, but not completely. No fevers.   Past Medical History:  Diagnosis Date  . Constipation    severe  . Diabetes mellitus without complication ([place])   . Nephrolithiasis    Past Surgical History:  Procedure Laterality Date  . FRACTURE SURGERY    . SHOULDER ARTHROSCOPY     bilat  . VASECTOMY     Social History  Substance Use Topics  . Smoking status: Former Research scientist (life sciences)  . Smokeless tobacco: Never Used     Comment: quit 30 yrs ago  . Alcohol use No     ROS:  As above   Medications: Current Outpatient Prescriptions  Medication Sig Dispense Refill  . ACCU-CHEK AVIVA PLUS test strip USE TO CHECK BLOOD SUGAR UP TO 4 TIMES A DAY 100 each 3  . ACCU-CHEK SOFTCLIX LANCETS lancets Use as instructed 100 each 12  . AMBULATORY NON FORMULARY MEDICATION Accu-chek aviva plus test strips and accu-chek softclix lancets, use to check blood sugar up to four times a day. Dx Type 2 diabetes. 100 Units 3  . atorvastatin (LIPITOR) 10 MG tablet Take 1 tablet (10 mg total) by mouth daily. 90 tablet 3  . linaclotide (LINZESS) 145 MCG CAPS capsule Take 1 capsule (145 mcg total) by mouth daily before breakfast. 30 capsule 2  . metFORMIN (GLUCOPHAGE) 500 MG tablet Take 1 tablet (500 mg total) by mouth daily with breakfast. 180 tablet 3  . Omega-3 Fatty Acids (FISH OIL PO) Take by mouth.     No current facility-administered medications for this visit.    Allergies  Allergen  Reactions  . Shellfish Allergy Other (See Comments)    Leg swelling      Exam:  BP (!) 154/81   Pulse 88   Temp 97.7 F (36.5 C) (Oral)   Wt 150 lb (68 kg)   SpO2 100%   BMI 22.81 kg/m  General: Well Developed, well nourished, and in no acute distress.  HEENT: Left eye without edema, erythema, or discharge. Left eyelid with round, erythematous, fluctuant mass ~1cm in diameter. Neuro/Psych: Alert and oriented x3, extra-ocular muscles intact, able to move all 4 extremities, sensation grossly intact. Skin: Warm and dry, no rashes noted.  Respiratory: Not using accessory muscles, speaking in full sentences, trachea midline.  Cardiovascular: Pulses palpable, no extremity edema. Abdomen: Does not appear distended.   No results found for this or any previous visit (from the past 48 hour(s)). No results found.    Assessment and Plan: 75 y.o. male with chalazion on left eyelid. Continue warm compresses, start polymixin eye drops, refer to ophthalmology for drainage.    No orders of the defined types were placed in this encounter.   Discussed warning signs or symptoms. Please see discharge instructions. Patient expresses understanding.

## 2016-08-19 DIAGNOSIS — H00014 Hordeolum externum left upper eyelid: Secondary | ICD-10-CM | POA: Diagnosis not present

## 2016-09-19 ENCOUNTER — Other Ambulatory Visit: Payer: Self-pay

## 2016-09-19 MED ORDER — GLUCOSE BLOOD VI STRP: ORAL_STRIP | 12 refills | Status: DC

## 2016-09-19 NOTE — Telephone Encounter (Signed)
Patient request refill for Accu-check test strips. #[phone] refills sent to [facility]. Oneta Rack

## 2016-10-25 ENCOUNTER — Ambulatory Visit (INDEPENDENT_AMBULATORY_CARE_PROVIDER_SITE_OTHER): Payer: Commercial Managed Care - HMO | Admitting: Osteopathic Medicine

## 2016-10-25 ENCOUNTER — Encounter: Payer: Self-pay | Admitting: Osteopathic Medicine

## 2016-10-25 VITALS — BP 140/70 | HR 68 | Ht 68.0 in | Wt 149.0 lb

## 2016-10-25 DIAGNOSIS — E119 Type 2 diabetes mellitus without complications: Secondary | ICD-10-CM | POA: Diagnosis not present

## 2016-10-25 DIAGNOSIS — I10 Essential (primary) hypertension: Secondary | ICD-10-CM

## 2016-10-25 LAB — POCT GLYCOSYLATED HEMOGLOBIN (HGB A1C): HEMOGLOBIN A1C: 6.2

## 2016-10-25 NOTE — Patient Instructions (Addendum)
Continue with current medications. Let plan to follow-up in 6 months for annual physical and routine labs. If you would like to get blood work done ahead of your visit so that we have the results by the time of your appointment, please call us a week or 2 prior to her appointment so that we can make sure the appropriate orders are in place with the lab.

## 2016-10-25 NOTE — Progress Notes (Signed)
HPI: Tony Banks is a 76 y.o. male  who presents to [place] today, 10/25/16,  for chief complaint of:  Chief Complaint  Patient presents with  . Follow-up    DIABETES    DM2: At last visit, A1c was well below goal for age at 6.1, we reduced dose of metformin and patient is here today to follow-up for repeat A1c, home blood sugars are running fasting in the 110s typically.  HTN: BP elevated on initial read today SBP 150s, improved on manual recheck. Patient has home blood pressure cuff at home that his wife uses but he does not really use it. No chest pain, pressure, shortness of breath.    Past medical history, surgical history, social history and family history reviewed.  Patient Active Problem List   Diagnosis Date Noted  . Chalazion left upper eyelid 08/18/2016  . Type 2 diabetes mellitus ([place]) 07/13/2015  . Hyperlipidemia 04/07/2015  . Hearing loss 04/06/2015  . Essential hypertension 10/27/2014  . Abdominal pain 04/26/2014  . Constipation 04/26/2014  . Leukocytosis 04/26/2014    Current medication list and allergy/intolerance information reviewed.   Current Outpatient Prescriptions on File Prior to Visit  Medication Sig Dispense Refill  . ACCU-CHEK SOFTCLIX LANCETS lancets Use as instructed 100 each 12  . AMBULATORY NON FORMULARY MEDICATION Accu-chek aviva plus test strips and accu-chek softclix lancets, use to check blood sugar up to four times a day. Dx Type 2 diabetes. 100 Units 3  . atorvastatin (LIPITOR) 10 MG tablet Take 1 tablet (10 mg total) by mouth daily. 90 tablet 3  . glucose blood (ACCU-CHEK AVIVA PLUS) test strip USE TO CHECK BLOOD SUGAR UP TO 4 TIMES A DAY 100 each 12  . linaclotide (LINZESS) 145 MCG CAPS capsule Take 1 capsule (145 mcg total) by mouth daily before breakfast. 30 capsule 2  . metFORMIN (GLUCOPHAGE) 500 MG tablet Take 1 tablet (500 mg total) by mouth daily with breakfast. 180 tablet 3  . Omega-3 Fatty  Acids (FISH OIL PO) Take by mouth.    . trimethoprim-polymyxin b (POLYTRIM) ophthalmic solution Place 2 drops into the left eye every 4 (four) hours. 10 mL 0   No current facility-administered medications on file prior to visit.    Allergies  Allergen Reactions  . Shellfish Allergy Other (See Comments)    Leg swelling       Review of Systems:  Constitutional: No recent illness  Cardiac: No  chest pain  Respiratory:  No  shortness of breath.   Exam:  BP 140/70   Pulse 68   Ht 5\' 8"  (1.727 m)   Wt 149 lb (67.6 kg)   BMI 22.66 kg/m   Constitutional: VS see above. General Appearance: alert, well-developed, well-nourished, NAD  Respiratory: Normal respiratory effort. no wheeze, no rhonchi, no rales  Cardiovascular: S1/S2 normal, RRR.   Musculoskeletal: Gait normal. Symmetric and independent movement of all extremities   Neurological: Normal balance/coordination. No tremor.  Skin: warm, dry, intact.   Psychiatric: Normal judgment/insight. Normal mood and affect. Oriented x3.    Recent Results (from the past 2160 hour(s))  POCT HgB A1C     Status: None   Collection Time: 10/25/16  8:20 AM  Result Value Ref Range   Hemoglobin A1C 6.2      Depression screen [facility] 2/9 04/26/2016 05/02/2014  Decreased Interest 0 0  Down, Depressed, Hopeless 0 0  PHQ - 2 Score 0 0      ASSESSMENT/PLAN:  Diabetes mellitus without complication ([place]) - Plan: POCT HgB A1C  Essential hypertension    Patient Instructions  Continue with current medications. Let plan to follow-up in 6 months for annual physical and routine labs. If you would like to get blood work done ahead of your visit so that we have the results by the time of your appointment, please call us a week or 2 prior to her appointment so that we can make sure the appropriate orders are in place with the lab.    Follow-up plan: Return in about 6 months (around 04/24/2017) for [place].  Visit summary  with medication list and pertinent instructions was printed for patient to review, alert [place] if any changes needed. All questions at time of visit were answered - patient instructed to contact office with any additional concerns. ER/RTC precautions were reviewed with the patient and understanding verbalized.

## 2016-10-27 ENCOUNTER — Other Ambulatory Visit: Payer: Self-pay

## 2016-10-27 MED ORDER — ATORVASTATIN CALCIUM 10 MG PO TABS: 10.0000 mg | ORAL_TABLET | Freq: Every day | ORAL | 3 refills | Status: DC

## 2017-02-02 ENCOUNTER — Other Ambulatory Visit: Payer: Self-pay | Admitting: Osteopathic Medicine

## 2017-02-03 ENCOUNTER — Other Ambulatory Visit: Payer: Self-pay

## 2017-02-03 NOTE — Telephone Encounter (Signed)
Error. Antonis Lor,CMA  

## 2017-03-09 ENCOUNTER — Telehealth: Payer: Self-pay | Admitting: Osteopathic Medicine

## 2017-03-09 DIAGNOSIS — Z125 Encounter for screening for malignant neoplasm of prostate: Secondary | ICD-10-CM

## 2017-03-09 DIAGNOSIS — E785 Hyperlipidemia, unspecified: Secondary | ICD-10-CM

## 2017-03-09 DIAGNOSIS — E119 Type 2 diabetes mellitus without complications: Secondary | ICD-10-CM

## 2017-03-09 DIAGNOSIS — I10 Essential (primary) hypertension: Secondary | ICD-10-CM

## 2017-03-09 NOTE — Telephone Encounter (Signed)
Called patient and advised him that he didn't have to schedule separate appointment. Will send lab order downstairs

## 2017-03-09 NOTE — Telephone Encounter (Signed)
I'm not sure what the automatted call for labs is referring to. However, if you tell me what labs you would like patient to have I will order.

## 2017-03-09 NOTE — Telephone Encounter (Signed)
Patient had to reschedule his physical due to provider being out of office. Patient adv that he had already requested to have his lab work sent down week of August 13th so he could go over his results in his appointment. Patient advised that he received a automated call that he is due for his kidney test and wanted to know if that can be added to his physical blood work or does that have to be separate. Thanks

## 2017-03-10 NOTE — Addendum Note (Signed)
Addended by: Maryla Morrow on: 03/10/2017 10:32 AM   Modules accepted: Orders

## 2017-03-10 NOTE — Telephone Encounter (Signed)
Added on A1C but otherwise looks like all labs are in! Thanks!

## 2017-04-19 ENCOUNTER — Ambulatory Visit: Payer: Commercial Managed Care - HMO

## 2017-04-19 DIAGNOSIS — E119 Type 2 diabetes mellitus without complications: Secondary | ICD-10-CM | POA: Diagnosis not present

## 2017-04-19 DIAGNOSIS — Z125 Encounter for screening for malignant neoplasm of prostate: Secondary | ICD-10-CM | POA: Diagnosis not present

## 2017-04-19 DIAGNOSIS — I10 Essential (primary) hypertension: Secondary | ICD-10-CM | POA: Diagnosis not present

## 2017-04-19 DIAGNOSIS — E785 Hyperlipidemia, unspecified: Secondary | ICD-10-CM | POA: Diagnosis not present

## 2017-04-19 LAB — CBC WITH DIFFERENTIAL/PLATELET
BASOS ABS: 0 {cells}/uL (ref 0–200)
BASOS PCT: 0 %
EOS ABS: 294 {cells}/uL (ref 15–500)
Eosinophils Relative: 2 %
HCT: 39.9 % (ref 38.5–50.0)
Hemoglobin: 13.5 g/dL (ref 13.2–17.1)
LYMPHS PCT: 55 %
Lymphs Abs: 8085 cells/uL — ABNORMAL HIGH (ref 850–3900)
MCH: 30.4 pg (ref 27.0–33.0)
MCHC: 33.8 g/dL (ref 32.0–36.0)
MCV: 89.9 fL (ref 80.0–100.0)
MONO ABS: 882 {cells}/uL (ref 200–950)
MONOS PCT: 6 %
MPV: 10.3 fL (ref 7.5–12.5)
NEUTROS PCT: 37 %
Neutro Abs: 5439 cells/uL (ref 1500–7800)
Platelets: 186 10*3/uL (ref 140–400)
RBC: 4.44 MIL/uL (ref 4.20–5.80)
RDW: 13.8 % (ref 11.0–15.0)
WBC: 14.7 10*3/uL — ABNORMAL HIGH (ref 3.8–10.8)

## 2017-04-20 LAB — COMPLETE METABOLIC PANEL WITH GFR
ALBUMIN: 4.7 g/dL (ref 3.6–5.1)
ALK PHOS: 77 U/L (ref 40–115)
ALT: 14 U/L (ref 9–46)
AST: 15 U/L (ref 10–35)
BUN: 18 mg/dL (ref 7–25)
CALCIUM: 9.2 mg/dL (ref 8.6–10.3)
CHLORIDE: 106 mmol/L (ref 98–110)
CO2: 24 mmol/L (ref 20–32)
Creat: 0.93 mg/dL (ref 0.70–1.18)
GFR, Est African American: >89 mL/min (ref >=60)
GFR, Est Non African American: 80 mL/min (ref >=60)
Glucose, Bld: 146 mg/dL — ABNORMAL HIGH (ref 65–99)
POTASSIUM: 4.2 mmol/L (ref 3.5–5.3)
Sodium: 140 mmol/L (ref 135–146)
Total Bilirubin: 0.6 mg/dL (ref 0.2–1.2)
Total Protein: 6.9 g/dL (ref 6.1–8.1)

## 2017-04-20 LAB — LIPID PANEL
CHOL/HDL RATIO: 3.2 ratio (ref <5.0)
CHOLESTEROL: 115 mg/dL (ref <200)
HDL: 36 mg/dL — AB (ref >40)
LDL Cholesterol: 65 mg/dL (ref <100)
TRIGLYCERIDES: 69 mg/dL (ref <150)
VLDL: 14 mg/dL (ref <30)

## 2017-04-20 LAB — HEMOGLOBIN A1C
Hgb A1c MFr Bld: 6 % — ABNORMAL HIGH (ref <5.7)
Mean Plasma Glucose: 126 mg/dL

## 2017-04-20 LAB — PSA: PSA: 0.4 ng/mL (ref <=4.0)

## 2017-04-21 ENCOUNTER — Telehealth: Payer: Self-pay

## 2017-04-21 NOTE — Telephone Encounter (Signed)
Keep CPE with Dr. Sheppard Coil.  Labs look pretty good. Liver, kidney good. Cholesterol great.  WBC is elevated and trending up could be due to recent infection or other things. I would address with PCP at CPE.

## 2017-04-24 ENCOUNTER — Encounter: Payer: Commercial Managed Care - HMO | Admitting: Osteopathic Medicine

## 2017-04-26 ENCOUNTER — Encounter: Payer: Self-pay | Admitting: Osteopathic Medicine

## 2017-04-26 ENCOUNTER — Ambulatory Visit (INDEPENDENT_AMBULATORY_CARE_PROVIDER_SITE_OTHER): Payer: Commercial Managed Care - HMO | Admitting: Osteopathic Medicine

## 2017-04-26 VITALS — BP 131/64 | HR 79 | Temp 98.0°F | Resp 17 | Ht 67.0 in | Wt 149.0 lb

## 2017-04-26 DIAGNOSIS — D72829 Elevated white blood cell count, unspecified: Secondary | ICD-10-CM

## 2017-04-26 DIAGNOSIS — E119 Type 2 diabetes mellitus without complications: Secondary | ICD-10-CM

## 2017-04-26 DIAGNOSIS — Z0000 Encounter for general adult medical examination without abnormal findings: Secondary | ICD-10-CM | POA: Diagnosis not present

## 2017-04-26 DIAGNOSIS — I10 Essential (primary) hypertension: Secondary | ICD-10-CM

## 2017-04-26 DIAGNOSIS — Z2821 Immunization not carried out because of patient refusal: Secondary | ICD-10-CM | POA: Diagnosis not present

## 2017-04-26 NOTE — Progress Notes (Signed)
   Activities of Daily Living In your present state of health, do you have any difficulty performing the following activities: 04/26/2017  Hearing? N  Vision? N  Difficulty concentrating or making decisions? N  Walking or climbing stairs? N  Dressing or bathing? N  Doing errands, shopping? N  Some recent data might be hidden    Patient Care Team: Emeterio Reeve, DO as PCP - General (Osteopathic Medicine)   Assessment:     Exercise Activities and Dietary recommendations    Goals    None     Fall Risk Fall Risk  05/02/2014  Falls in the past year? No   Depression Screen PHQ 2/9 Scores 04/26/2017 04/26/2016 05/02/2014  PHQ - 2 Score 0 0 0    Cognitive Function     6CIT Screen 04/26/2017  What Year? 0 points  What month? 0 points  What time? 0 points  Count back from 20 0 points  Months in reverse 0 points  Repeat phrase 0 points  Total Score 0    Immunization History  Administered Date(s) Administered  . Influenza-Unspecified 07/10/2015, 07/06/2016  . Pneumococcal Conjugate-13 04/26/2016  . Pneumococcal Polysaccharide-23 04/27/2014   Screening Tests Health Maintenance  Topic Date Due  . OPHTHALMOLOGY EXAM  07/07/2016  . FOOT EXAM  04/26/2017  . URINE MICROALBUMIN  04/26/2017 (Originally 10/28/2015)  . INFLUENZA VACCINE  05/16/2018 (Originally 04/05/2017)  . TETANUS/TDAP  04/05/2025 (Originally 11/18/1959)  . HEMOGLOBIN A1C  10/20/2017  . COLONOSCOPY  05/22/2024  . PNA vac Low Risk Adult  Completed

## 2017-04-26 NOTE — Patient Instructions (Signed)
Preventive Care 75 Years and Older, Male Preventive care refers to lifestyle choices and visits with your health care provider that can promote health and wellness. What does preventive care include?  A yearly physical exam. This is also called an annual well check.  Dental exams once or twice a year.  Routine eye exams. Ask your health care provider how often you should have your eyes checked.  Personal lifestyle choices, including: ? Daily care of your teeth and gums. ? Regular physical activity. ? Eating a healthy diet. ? Avoiding tobacco and drug use. ? Limiting alcohol use. ? Practicing safe sex. ? Taking low doses of aspirin every day. ? Taking vitamin and mineral supplements as recommended by your health care provider. What happens during an annual well check? The services and screenings done by your health care provider during your annual well check will depend on your age, overall health, lifestyle risk factors, and family history of disease. Counseling Your health care provider may ask you questions about your:  Alcohol use.  Tobacco use.  Drug use.  Emotional well-being.  Home and relationship well-being.  Sexual activity.  Eating habits.  History of falls.  Memory and ability to understand (cognition).  Work and work environment.  Screening You may have the following tests or measurements:  Height, weight, and BMI.  Blood pressure.  Lipid and cholesterol levels. These may be checked every 5 years, or more frequently if you are over 50 years old.  Skin check.  Lung cancer screening. You may have this screening every year starting at age 55 if you have a 30-pack-year history of smoking and currently smoke or have quit within the past 15 years.  Fecal occult blood test (FOBT) of the stool. You may have this test every year starting at age 50.  Flexible sigmoidoscopy or colonoscopy. You may have a sigmoidoscopy every 5 years or a colonoscopy every 10  years starting at age 50.  Prostate cancer screening. Recommendations will vary depending on your family history and other risks.  Hepatitis C blood test.  Hepatitis B blood test.  Sexually transmitted disease (STD) testing.  Diabetes screening. This is done by checking your blood sugar (glucose) after you have not eaten for a while (fasting). You may have this done every 1-3 years.  Abdominal aortic aneurysm (AAA) screening. You may need this if you are a current or former smoker.  Osteoporosis. You may be screened starting at age 70 if you are at high risk.  Talk with your health care provider about your test results, treatment options, and if necessary, the need for more tests. Vaccines Your health care provider may recommend certain vaccines, such as:  Influenza vaccine. This is recommended every year.  Tetanus, diphtheria, and acellular pertussis (Tdap, Td) vaccine. You may need a Td booster every 10 years.  Varicella vaccine. You may need this if you have not been vaccinated.  Zoster vaccine. You may need this after age 60.  Measles, mumps, and rubella (MMR) vaccine. You may need at least one dose of MMR if you were born in 1957 or later. You may also need a second dose.  Pneumococcal 13-valent conjugate (PCV13) vaccine. One dose is recommended after age 75.  Pneumococcal polysaccharide (PPSV23) vaccine. One dose is recommended after age 75.  Meningococcal vaccine. You may need this if you have certain conditions.  Hepatitis A vaccine. You may need this if you have certain conditions or if you travel or work in places where you   may be exposed to hepatitis A.  Hepatitis B vaccine. You may need this if you have certain conditions or if you travel or work in places where you may be exposed to hepatitis B.  Haemophilus influenzae type b (Hib) vaccine. You may need this if you have certain risk factors.  Talk to your health care provider about which screenings and vaccines  you need and how often you need them. This information is not intended to replace advice given to you by your health care provider. Make sure you discuss any questions you have with your health care provider. Document Released: 09/18/2015 Document Revised: 05/11/2016 Document Reviewed: 06/23/2015 Elsevier Interactive Patient Education  2017 [employer].

## 2017-04-26 NOTE — Progress Notes (Signed)
HPI: Tony Banks is a 76 y.o. male  who presents to [facility] today, 04/26/17,  for Medicare Annual Wellness Exam  Patient presents for annual physical/Medicare wellness exam. no complaints today.  6CIT normal PHQ9 normal Can't remember his eye doctor's name but no other specialists    Past medical, surgical, social and family history reviewed:  Patient Active Problem List   Diagnosis Date Noted  . Chalazion left upper eyelid 08/18/2016  . Type 2 diabetes mellitus ([place]) 07/13/2015  . Hyperlipidemia 04/07/2015  . Hearing loss 04/06/2015  . Essential hypertension 10/27/2014  . Abdominal pain 04/26/2014  . Constipation 04/26/2014  . Leukocytosis 04/26/2014    Past Surgical History:  Procedure Laterality Date  . FRACTURE SURGERY    . SHOULDER ARTHROSCOPY     bilat  . VASECTOMY      Social History   Social History  . Marital status: Married    Spouse name: N/A  . Number of children: N/A  . Years of education: N/A   Occupational History  . Not on file.   Social History Main Topics  . Smoking status: Former Research scientist (life sciences)  . Smokeless tobacco: Never Used     Comment: quit 30 yrs ago  . Alcohol use No  . Drug use: No  . Sexual activity: No   Other Topics Concern  . Not on file   Social History Narrative  . No narrative on file    Family History  Problem Relation Age of Onset  . Heart disease Mother   . Stroke Mother   . Cancer Father   . Stroke Maternal Grandfather   . Colon cancer Neg Hx   . Pancreatic cancer Neg Hx   . Rectal cancer Neg Hx   . Stomach cancer Neg Hx      Current medication list and allergy/intolerance information reviewed:    Outpatient Encounter Prescriptions as of 04/26/2017  Medication Sig  . ACCU-CHEK SOFTCLIX LANCETS lancets Use as instructed  . AMBULATORY NON FORMULARY MEDICATION Accu-chek aviva plus test strips and accu-chek softclix lancets, use to check blood sugar up to four times a day.  Dx Type 2 diabetes.  Marland Kitchen atorvastatin (LIPITOR) 10 MG tablet Take 1 tablet (10 mg total) by mouth daily.  Marland Kitchen glucose blood (ACCU-CHEK AVIVA PLUS) test strip USE TO CHECK BLOOD SUGAR UP TO 4 TIMES A DAY  . linaclotide (LINZESS) 145 MCG CAPS capsule Take 1 capsule (145 mcg total) by mouth daily before breakfast.  . metFORMIN (GLUCOPHAGE) 500 MG tablet Take 1 tablet (500 mg total) by mouth daily with breakfast.  . metFORMIN (GLUCOPHAGE) 500 MG tablet TAKE 1 TABLET BY MOUTH 2 TIMES DAILY WITH A MEAL.  Marland Kitchen Omega-3 Fatty Acids (FISH OIL PO) Take by mouth.  . trimethoprim-polymyxin b (POLYTRIM) ophthalmic solution Place 2 drops into the left eye every 4 (four) hours.   No facility-administered encounter medications on file as of 04/26/2017.     Allergies  Allergen Reactions  . Shellfish Allergy Other (See Comments)    Leg swelling        Review of Systems: Review of Systems - General ROS: negative Psychological ROS: negative Ophthalmic ROS: negative ENT ROS: negative Hematological and Lymphatic ROS: negative for - bleeding problems, fatigue, jaundice, night sweats, swollen lymph nodes or weight loss Endocrine ROS: negative Respiratory ROS: no cough, shortness of breath, or wheezing Cardiovascular ROS: no chest pain or dyspnea on exertion Gastrointestinal ROS: no abdominal pain, change in bowel habits,  or black or bloody stools Musculoskeletal ROS: negative for - joint pain, muscle pain or muscular weakness Dermatological ROS: negative for rash and skin lesion changes   Medicare Wellness Questionnaire  Are there smokers in your home (other than you)? no  Depression Screen (Note: if answer to either of the following is "Yes", a more complete depression screening is indicated)   Q1: Over the past two weeks, have you felt down, depressed or hopeless? no  Q2: Over the past two weeks, have you felt little interest or pleasure in doing things? no  Have you lost interest or pleasure in daily  life? no  Do you often feel hopeless? no  Do you cry easily over simple problems? no  Activities of Daily Living In your present state of health, do you have any difficulty performing the following activities?:  Driving? no Managing money?  no Feeding yourself? no Getting from bed to chair? no Climbing a flight of stairs? no Preparing food and eating?: no Bathing or showering? no Getting dressed: no Getting to the toilet? no Using the toilet: no Moving around from place to place: no In the past year have you fallen or had a near fall?: no  Hearing Difficulties:  Do you often ask people to speak up or repeat themselves? no Do you experience ringing or noises in your ears? no  Do you have difficulty understanding soft or whispered voices? no  Memory Difficulties:  Do you feel that you have a problem with memory? no  Do you often misplace items? no  Do you feel safe at home?  yes  Sexual Health:   Are you sexually active?  Yes  Do you have more than one partner?  No  Advanced Directives:   Advanced directives discussed: has NO advanced directive  - add't info requested. Referral to SW: not applicable  Additional information provided: yes  Risk Factors  Current exercise habits: daily bike and walking, weight lifting  Dietary issues discussed:diabetic diet compliant  Cardiac risk factors: Diabetes Mellitus, hypertension, hypercholesterolemia/hyperlipidemia   Exam:  BP 131/64   Pulse 79   Temp 98 F (36.7 C)   Resp 17   Ht 5\' 7"  (1.702 m)   Wt 149 lb (67.6 kg)   SpO2 100%   BMI 23.34 kg/m    Constitutional: VS see above. General Appearance: alert, well-developed, well-nourished, NAD  Ears, Nose, Mouth, Throat: MMM  Neck: No masses, trachea midline.   Respiratory: Normal respiratory effort. no wheeze, no rhonchi, no rales  Cardiovascular:No lower extremity edema.   Musculoskeletal: Gait normal. No clubbing/cyanosis of digits.   Neurological: Normal  balance/coordination. No tremor. Recalls 3 objects and able to read face of watch with correct time.   Skin: warm, dry, intact. No rash/ulcer.   Psychiatric: Normal judgment/insight. Normal mood and affect. Oriented x3.     ASSESSMENT/PLAN:   Encounter for Medicare annual wellness exam  Medicare annual wellness visit, subsequent  Essential hypertension  Type 2 diabetes mellitus without complication, without long-term current use of insulin ([place])  Leukocytosis, unspecified type - addendum - spoke to patient over the phone re: WBC results, he's ok to go ahead and see HemOnc in [place] and would like to defer labs to them - Plan: Pathologist smear review, LEUKEMIA/LYMPHOMA EVALUATION PANEL, Ambulatory referral to Hematology / Oncology, CANCELED: Save smear  Influenza vaccination declined  Encounter for Medicare annual wellness exam    CANCER SCREENING  Lung - does not need  Colon - does not need  Prostate - does not need  Breast - does not need  Cervical - does not need OTHER DISEASE SCREENING  Lipid - does not need  DM2 - does not need  AAA - male 36-75yo ever smoked - does not need  Osteoporosis - women 75yo+, men 75yo+ - consider INFECTIOUS DISEASE SCREENING  HIV - does not need  GC/CT - does not need  HepC -does not need  TB - does not need ADULT VACCINATION  Influenza - annual vaccine recommended  Td - booster every 10 years - does not need  Zoster - option at 50, yes at 77+ - does not need  PCV13 - does not need  PPSV23 - does not need Immunization History  Administered Date(s) Administered  . Influenza-Unspecified 07/10/2015, 07/06/2016  . Pneumococcal Conjugate-13 04/26/2016  . Pneumococcal Polysaccharide-23 04/27/2014   OTHER  Fall - exercise and Vit D age 75+ - does not need  Advanced Directives -  Discussed as above   During the course of the visit the patient was educated and counseled about appropriate screening and preventive  services as noted above.   Patient Instructions (the written plan) was given to the patient.  Medicare Attestation I have personally reviewed: The patient's medical and social history Their use of alcohol, tobacco or illicit drugs Their current medications and supplements The patient's functional ability including ADLs,fall risks, home safety risks, cognitive, and hearing and visual impairment Diet and physical activities Evidence for depression or mood disorders  The patient's weight, height, BMI, and visual acuity have been recorded in the chart.  I have made referrals, counseling, and provided education to the patient based on review of the above and I have provided the patient with a written personalized care plan for preventive services.     Emeterio Reeve, DO   04/26/17   Visit summary with medication list and pertinent instructions was printed for patient to review. All questions at time of visit were answered - patient instructed to contact office with any additional concerns. ER/RTC precautions were reviewed with the patient. Follow-up plan: Return in about 6 months (around 10/27/2017) for recheck sugars, sooner if needed .

## 2017-04-27 LAB — PATHOLOGIST SMEAR REVIEW

## 2017-05-11 NOTE — Telephone Encounter (Signed)
Need to close

## 2017-05-15 ENCOUNTER — Telehealth: Payer: Self-pay | Admitting: Hematology and Oncology

## 2017-05-15 ENCOUNTER — Encounter: Payer: Self-pay | Admitting: Hematology and Oncology

## 2017-05-15 NOTE — Telephone Encounter (Signed)
Appt has been scheduled for the pt to see Dr. Lebron Conners on 9/24 at 11am. Pt aware to arrive 30 minutes early. Letter mailed.

## 2017-05-29 ENCOUNTER — Telehealth: Payer: Self-pay | Admitting: Hematology and Oncology

## 2017-05-29 ENCOUNTER — Other Ambulatory Visit (HOSPITAL_COMMUNITY)
Admission: RE | Admit: 2017-05-29 | Discharge: 2017-05-29 | Disposition: A | Discharge status: Home / Self Care | Payer: Medicare HMO | Source: Ambulatory Visit | Attending: Hematology and Oncology | Admitting: Hematology and Oncology

## 2017-05-29 ENCOUNTER — Ambulatory Visit (HOSPITAL_BASED_OUTPATIENT_CLINIC_OR_DEPARTMENT_OTHER): Payer: Commercial Managed Care - HMO | Admitting: Hematology and Oncology

## 2017-05-29 ENCOUNTER — Encounter: Payer: Self-pay | Admitting: Hematology and Oncology

## 2017-05-29 ENCOUNTER — Ambulatory Visit (HOSPITAL_BASED_OUTPATIENT_CLINIC_OR_DEPARTMENT_OTHER): Payer: Commercial Managed Care - HMO

## 2017-05-29 VITALS — BP 174/73 | HR 73 | Temp 98.6°F | Resp 20 | Ht 67.0 in | Wt 154.3 lb

## 2017-05-29 DIAGNOSIS — D72829 Elevated white blood cell count, unspecified: Secondary | ICD-10-CM

## 2017-05-29 DIAGNOSIS — D72820 Lymphocytosis (symptomatic): Secondary | ICD-10-CM

## 2017-05-29 LAB — CBC WITH DIFFERENTIAL/PLATELET
BASO%: 0.1 % (ref 0.0–2.0)
Basophils Absolute: 0 10*3/uL (ref 0.0–0.1)
EOS%: 0.9 % (ref 0.0–7.0)
Eosinophils Absolute: 0.1 10*3/uL (ref 0.0–0.5)
HCT: 38.7 % (ref 38.4–49.9)
HEMOGLOBIN: 12.8 g/dL — AB (ref 13.0–17.1)
LYMPH%: 54.1 % — AB (ref 14.0–49.0)
MCH: 30.2 pg (ref 27.2–33.4)
MCHC: 33.1 g/dL (ref 32.0–36.0)
MCV: 91.3 fL (ref 79.3–98.0)
MONO#: 0.7 10*3/uL (ref 0.1–0.9)
MONO%: 4.3 % (ref 0.0–14.0)
NEUT%: 40.6 % (ref 39.0–75.0)
NEUTROS ABS: 6.6 10*3/uL — AB (ref 1.5–6.5)
Platelets: 151 10*3/uL (ref 140–400)
RBC: 4.24 10*6/uL (ref 4.20–5.82)
RDW: 13.8 % (ref 11.0–14.6)
WBC: 16.3 10*3/uL — AB (ref 4.0–10.3)
lymph#: 8.8 10*3/uL — ABNORMAL HIGH (ref 0.9–3.3)

## 2017-05-29 LAB — COMPREHENSIVE METABOLIC PANEL
ALT: 17 U/L (ref 0–55)
AST: 19 U/L (ref 5–34)
Albumin: 4.3 g/dL (ref 3.5–5.0)
Alkaline Phosphatase: 70 U/L (ref 40–150)
Anion Gap: 7 mEq/L (ref 3–11)
BUN: 12 mg/dL (ref 7.0–26.0)
CO2: 26 meq/L (ref 22–29)
Calcium: 9.4 mg/dL (ref 8.4–10.4)
Chloride: 110 mEq/L — ABNORMAL HIGH (ref 98–109)
Creatinine: 0.9 mg/dL (ref 0.7–1.3)
EGFR: 84 mL/min/{1.73_m2} — AB (ref >90)
GLUCOSE: 106 mg/dL (ref 70–140)
POTASSIUM: 4.2 meq/L (ref 3.5–5.1)
SODIUM: 142 meq/L (ref 136–145)
TOTAL PROTEIN: 7.3 g/dL (ref 6.4–8.3)
Total Bilirubin: 0.99 mg/dL (ref 0.20–1.20)

## 2017-05-29 LAB — TECHNOLOGIST REVIEW

## 2017-05-29 LAB — LACTATE DEHYDROGENASE: LDH: 165 U/L (ref 125–245)

## 2017-05-29 NOTE — Telephone Encounter (Signed)
Patient sent to lab and message sent to MP to confirm it patient is to f/u in one week. Los for 9/24 not complete.

## 2017-05-30 ENCOUNTER — Telehealth: Payer: Self-pay | Admitting: Hematology and Oncology

## 2017-05-30 LAB — BETA 2 MICROGLOBULIN, SERUM: Beta-2: 1.6 mg/L (ref 0.6–2.4)

## 2017-05-30 NOTE — Telephone Encounter (Signed)
Patient called today to inquire about f/u. Patient seen 9/24 and los not completed upon check. Out. Confirmed w/desk nurse patient should f/u in one week.   Gave patient f/u for 10/1.

## 2017-05-31 ENCOUNTER — Telehealth: Payer: Self-pay | Admitting: Hematology and Oncology

## 2017-05-31 NOTE — Telephone Encounter (Signed)
Received forwarded message today from patient inquiring about f/u appointment. Spoke with patient re 10/1 f/u. See also previous note.

## 2017-06-01 LAB — FLOW CYTOMETRY

## 2017-06-01 NOTE — Progress Notes (Signed)
New Site Cancer New Visit:  Assessment: Leukocytosis 76 y.o. 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 Differential diagnosis includes CLL, hairy cell leukemia, marginal zone lymphoma, or another lymphoproliferative process. Reactive ptosis is also possible expanding the differential into the autoimmune and infectious conditions.  Patient is somewhat symptomatic, but whether the symptoms are attributable to the lymphocytosis is not clear at this time. Clinical examination is suspicious for presence of splenomegaly.  Plan: --labs today as outlined below --Return to clinic in 1 week to review the findings and plan further assessment.  Voice recognition software was used and creation of this note. Despite my best effort at editing the text, some misspelling/errors may have occurred.  Orders Placed This Encounter  Procedures  . CBC with Differential    Standing Status:   Future    Number of Occurrences:   1    Standing Expiration Date:   05/29/2018  . Comprehensive metabolic panel    Standing Status:   Future    Number of Occurrences:   1    Standing Expiration Date:   05/29/2018  . Lactate dehydrogenase (LDH)    Standing Status:   Future    Number of Occurrences:   1    Standing Expiration Date:   05/29/2018  . Beta 2 microglobulin    Standing Status:   Future    Number of Occurrences:   1    Standing Expiration Date:   05/29/2018  . Flow Cytometry    Standing Status:   Future    Number of Occurrences:   1    Standing Expiration Date:   05/29/2018    All questions were answered.  . The patient knows to call the clinic with any problems, questions or concerns.  This note was electronically signed.    History of Presenting Illness Tony Banks 76 y.o. presenting to the [facility] for Evaluation of leukocytosis. On review of the labs, leukocytosis has been present since 2015,  please see the blood count trends outlined below. At this time, patient reports no fevers, chills, unexpected weight loss or change in energy level. He does complain off significant night sweats. He occasionally has pain in the posterior thighs and pain in the bones of his legs. The pain usually is short-lived. Complains of chronic back pain. He also complains of numbness in bilateral feet for the past 2 years. Denies any chest pain, shortness of breath, or cough. No, dysphasia, early satiety, abdominal pain, diarrhea, or constipation. No dysuria or hematuria. No focal weakness in the extremities or face.  Oncological/hematological History: --Labs, 04/27/14: WBC   7.5, ALC   ..., Hgb 12.3, Plt 181; --Labs, 04/06/15: WBC 10.7, ALC   ..., Hgb 13.8, Plt 210; --Labs, 04/27/16: WBC 13.5, ALC   ..., Hgb 13.4, Plt 185; --Labs, 04/19/17: WBC 14.7, ALC 8.1, Hgb 13.5, Plt 186; Smear -- atypical lymphocytes present   Medical History: Past Medical History:  Diagnosis Date  . Constipation    severe  . Diabetes mellitus without complication ([place])   . Nephrolithiasis     Surgical History: Past Surgical History:  Procedure Laterality Date  . FRACTURE SURGERY    . SHOULDER ARTHROSCOPY     bilat  . VASECTOMY      Family History: Family History  Problem Relation Age of Onset  . Heart disease Mother   . Stroke Mother   . Cancer Father   . Stroke Maternal  Grandfather   . Colon cancer Neg Hx   . Pancreatic cancer Neg Hx   . Rectal cancer Neg Hx   . Stomach cancer Neg Hx     Social History: Social History   Social History  . Marital status: Married    Spouse name: N/A  . Number of children: N/A  . Years of education: N/A   Occupational History  . Not on file.   Social History Main Topics  . Smoking status: Former Research scientist (life sciences)  . Smokeless tobacco: Never Used     Comment: quit 30 yrs ago  . Alcohol use No  . Drug use: No  . Sexual activity: No   Other Topics Concern  . Not on file    Social History Narrative  . No narrative on file    Allergies: Allergies  Allergen Reactions  . Shellfish Allergy Other (See Comments)    Leg swelling     Medications:  Current Outpatient Prescriptions  Medication Sig Dispense Refill  . ACCU-CHEK SOFTCLIX LANCETS lancets Use as instructed 100 each 12  . AMBULATORY NON FORMULARY MEDICATION Accu-chek aviva plus test strips and accu-chek softclix lancets, use to check blood sugar up to four times a day. Dx Type 2 diabetes. 100 Units 3  . atorvastatin (LIPITOR) 10 MG tablet Take 1 tablet (10 mg total) by mouth daily. 90 tablet 3  . glucose blood (ACCU-CHEK AVIVA PLUS) test strip USE TO CHECK BLOOD SUGAR UP TO 4 TIMES A DAY 100 each 12  . linaclotide (LINZESS) 145 MCG CAPS capsule Take 1 capsule (145 mcg total) by mouth daily before breakfast. 30 capsule 2  . metFORMIN (GLUCOPHAGE) 500 MG tablet Take 1 tablet (500 mg total) by mouth daily with breakfast. 180 tablet 3  . Omega-3 Fatty Acids (FISH OIL PO) Take by mouth.     No current facility-administered medications for this visit.     Review of Systems: Review of Systems  Constitutional: Positive for diaphoresis. Negative for appetite change, chills, fatigue and fever.  HENT:  Negative.   Eyes: Negative.   Respiratory: Negative.   Cardiovascular: Negative.   Gastrointestinal: Negative.   Endocrine: Negative.   Genitourinary: Negative.    Musculoskeletal: Positive for arthralgias and back pain.  Skin: Negative.   Neurological: Positive for numbness. Negative for extremity weakness.  Hematological: Negative.   Psychiatric/Behavioral: Negative.      PHYSICAL EXAMINATION Blood pressure (!) 174/73, pulse 73, temperature 98.6 F (37 C), temperature source Oral, resp. rate 20, height _0  (1.702 m), weight 154 lb 4.8 oz (70 kg), SpO2 97 %.  ECOG PERFORMANCE STATUS: 1 - Symptomatic but completely ambulatory  Physical Exam  Constitutional: He is oriented to person, place,  and time and well-developed, well-nourished, and in no distress. No distress.  HENT:  Head: Normocephalic and atraumatic.  Mouth/Throat: Oropharynx is clear and moist. No oropharyngeal exudate.  Eyes: Pupils are equal, round, and reactive to light. Conjunctivae and EOM are normal. No scleral icterus.  Neck: Normal range of motion. No thyromegaly present.  Cardiovascular: Normal rate, regular rhythm and normal heart sounds.  Exam reveals no gallop and no friction rub.   No murmur heard. Pulmonary/Chest: Effort normal and breath sounds normal. No respiratory distress. He has no wheezes. He has no rales. He exhibits no tenderness.  Abdominal: Soft. Bowel sounds are normal. He exhibits no distension. There is no tenderness.  Palpable fullness in the left upper quadrant of the abdomen with significant dullness to percussion. I cannot distinctly  feel the edge of the spleen, but does appear to be significantly enlarged.  Musculoskeletal: Normal range of motion. He exhibits no edema or deformity.  Lymphadenopathy:       Head (right side): No submandibular and no occipital adenopathy present.       Head (left side): No submandibular and no occipital adenopathy present.    He has no cervical adenopathy.    He has no axillary adenopathy.       Right: No inguinal and no supraclavicular adenopathy present.       Left: No inguinal and no supraclavicular adenopathy present.  Neurological: He is alert and oriented to person, place, and time. He has normal reflexes. No cranial nerve deficit.  Skin: Skin is warm and dry. No rash noted. He is not diaphoretic. No erythema. No pallor.     LABORATORY DATA: I have personally reviewed the data as listed: Appointment on 05/29/2017  Component Date Value Ref Range Status  . WBC 05/29/2017 16.3* 4.0 - 10.3 10e3/uL Final  . NEUT# 05/29/2017 6.6* 1.5 - 6.5 10e3/uL Final  . HGB 05/29/2017 12.8* 13.0 - 17.1 g/dL Final  . HCT 05/29/2017 38.7  38.4 - 49.9 % Final  .  Platelets 05/29/2017 151  140 - 400 10e3/uL Final  . MCV 05/29/2017 91.3  79.3 - 98.0 fL Final  . MCH 05/29/2017 30.2  27.2 - 33.4 pg Final  . MCHC 05/29/2017 33.1  32.0 - 36.0 g/dL Final  . RBC 05/29/2017 4.24  4.20 - 5.82 10e6/uL Final  . RDW 05/29/2017 13.8  11.0 - 14.6 % Final  . lymph# 05/29/2017 8.8* 0.9 - 3.3 10e3/uL Final  . MONO# 05/29/2017 0.7  0.1 - 0.9 10e3/uL Final  . Eosinophils Absolute 05/29/2017 0.1  0.0 - 0.5 10e3/uL Final  . Basophils Absolute 05/29/2017 0.0  0.0 - 0.1 10e3/uL Final  . NEUT% 05/29/2017 40.6  39.0 - 75.0 % Final  . LYMPH% 05/29/2017 54.1* 14.0 - 49.0 % Final  . MONO% 05/29/2017 4.3  0.0 - 14.0 % Final  . EOS% 05/29/2017 0.9  0.0 - 7.0 % Final  . BASO% 05/29/2017 0.1  0.0 - 2.0 % Final  . Sodium 05/29/2017 142  136 - 145 mEq/L Final  . Potassium 05/29/2017 4.2  3.5 - 5.1 mEq/L Final  . Chloride 05/29/2017 110* 98 - 109 mEq/L Final  . CO2 05/29/2017 26  22 - 29 mEq/L Final  . Glucose 05/29/2017 106  70 - 140 mg/dl Final   Glucose reference range is for nonfasting patients. Fasting glucose reference range is 70- 100.  Marland Kitchen BUN 05/29/2017 12.0  7.0 - 26.0 mg/dL Final  . Creatinine 05/29/2017 0.9  0.7 - 1.3 mg/dL Final  . Total Bilirubin 05/29/2017 0.99  0.20 - 1.20 mg/dL Final  . Alkaline Phosphatase 05/29/2017 70  40 - 150 U/L Final  . AST 05/29/2017 19  5 - 34 U/L Final  . ALT 05/29/2017 17  0 - 55 U/L Final  . Total Protein 05/29/2017 7.3  6.4 - 8.3 g/dL Final  . Albumin 05/29/2017 4.3  3.5 - 5.0 g/dL Final  . Calcium 05/29/2017 9.4  8.4 - 10.4 mg/dL Final  . Anion Gap 05/29/2017 7  3 - 11 mEq/L Final  . EGFR 05/29/2017 84* >90 ml/min/1.73 m2 Final   eGFR is calculated using the CKD-EPI Creatinine Equation (2009)  . LDH 05/29/2017 165  125 - 245 U/L Final  . Beta-2 05/29/2017 1.6  0.6 - 2.4 mg/L Final  [employer] Immulite 2000 Immunochemiluminometric assay [facility])  . Flow Cytometry 05/29/2017 See Separate Report   Final  . Technologist Review  05/29/2017 Variant lymphs present   Final        Ardath Sax, MD

## 2017-06-01 NOTE — Assessment & Plan Note (Signed)
76 y.o. 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 Differential diagnosis includes CLL, hairy cell leukemia, marginal zone lymphoma, or another lymphoproliferative process. Reactive ptosis is also possible expanding the differential into the autoimmune and infectious conditions.  Patient is somewhat symptomatic, but whether the symptoms are attributable to the lymphocytosis is not clear at this time. Clinical examination is suspicious for presence of splenomegaly.  Plan: --labs today as outlined below --Return to clinic in 1 week to review the findings and plan further assessment.

## 2017-06-05 ENCOUNTER — Ambulatory Visit: Payer: Commercial Managed Care - HMO | Admitting: Hematology and Oncology

## 2017-06-08 ENCOUNTER — Telehealth: Payer: Self-pay | Admitting: Hematology and Oncology

## 2017-06-08 ENCOUNTER — Encounter: Payer: Self-pay | Admitting: Hematology and Oncology

## 2017-06-08 ENCOUNTER — Other Ambulatory Visit (HOSPITAL_COMMUNITY)
Admission: RE | Admit: 2017-06-08 | Discharge: 2017-06-08 | Disposition: A | Discharge status: Home / Self Care | Payer: Medicare HMO | Source: Ambulatory Visit | Attending: Hematology and Oncology | Admitting: Hematology and Oncology

## 2017-06-08 ENCOUNTER — Ambulatory Visit (HOSPITAL_BASED_OUTPATIENT_CLINIC_OR_DEPARTMENT_OTHER): Payer: Medicare HMO

## 2017-06-08 ENCOUNTER — Ambulatory Visit (HOSPITAL_BASED_OUTPATIENT_CLINIC_OR_DEPARTMENT_OTHER): Payer: Medicare HMO | Admitting: Hematology and Oncology

## 2017-06-08 VITALS — BP 152/62 | HR 78 | Temp 97.9°F | Resp 17 | Ht 67.0 in | Wt 154.1 lb

## 2017-06-08 DIAGNOSIS — E119 Type 2 diabetes mellitus without complications: Secondary | ICD-10-CM | POA: Insufficient documentation

## 2017-06-08 DIAGNOSIS — Z823 Family history of stroke: Secondary | ICD-10-CM | POA: Insufficient documentation

## 2017-06-08 DIAGNOSIS — Z87891 Personal history of nicotine dependence: Secondary | ICD-10-CM

## 2017-06-08 DIAGNOSIS — R200 Anesthesia of skin: Secondary | ICD-10-CM

## 2017-06-08 DIAGNOSIS — Z8249 Family history of ischemic heart disease and other diseases of the circulatory system: Secondary | ICD-10-CM | POA: Diagnosis not present

## 2017-06-08 DIAGNOSIS — G8929 Other chronic pain: Secondary | ICD-10-CM | POA: Diagnosis not present

## 2017-06-08 DIAGNOSIS — Z87442 Personal history of urinary calculi: Secondary | ICD-10-CM | POA: Insufficient documentation

## 2017-06-08 DIAGNOSIS — Z91013 Allergy to seafood: Secondary | ICD-10-CM | POA: Diagnosis not present

## 2017-06-08 DIAGNOSIS — C9110 Chronic lymphocytic leukemia of B-cell type not having achieved remission: Secondary | ICD-10-CM | POA: Insufficient documentation

## 2017-06-08 DIAGNOSIS — M545 Low back pain: Secondary | ICD-10-CM | POA: Diagnosis not present

## 2017-06-08 DIAGNOSIS — R61 Generalized hyperhidrosis: Secondary | ICD-10-CM | POA: Diagnosis not present

## 2017-06-08 DIAGNOSIS — Z7984 Long term (current) use of oral hypoglycemic drugs: Secondary | ICD-10-CM | POA: Insufficient documentation

## 2017-06-08 DIAGNOSIS — Z79899 Other long term (current) drug therapy: Secondary | ICD-10-CM | POA: Diagnosis not present

## 2017-06-08 DIAGNOSIS — C9190 Lymphoid leukemia, unspecified not having achieved remission: Secondary | ICD-10-CM | POA: Diagnosis not present

## 2017-06-08 DIAGNOSIS — D72829 Elevated white blood cell count, unspecified: Secondary | ICD-10-CM | POA: Diagnosis not present

## 2017-06-08 NOTE — Telephone Encounter (Signed)
Gave avs and calendar for October  °

## 2017-06-09 LAB — IGG, IGA, IGM
IGA/IMMUNOGLOBULIN A, SERUM: 128 mg/dL (ref 61–437)
IGG (IMMUNOGLOBIN G), SERUM: 911 mg/dL (ref 700–1600)
IgM, Qn, Serum: 83 mg/dL (ref 15–143)

## 2017-06-12 NOTE — Assessment & Plan Note (Signed)
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 No associated anemia thrombocytopenia. To complete staging, we will obtain imaging as well as FISH and cytogenetics tried a five-year risk of the disease.  Plan: --Labs today

## 2017-06-12 NOTE — Progress Notes (Signed)
[facility] Follow-up Visit:  Assessment: CLL (chronic lymphocytic leukemia) ([place]) 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 No associated anemia thrombocytopenia. To complete staging, we will obtain imaging as well as FISH and cytogenetics tried a five-year risk of the disease.  Plan: --Labs today --return to clinic in 2 weeks to review the findings and discuss possible need for therapy  Voice recognition software was used and creation of this note. Despite my best effort at editing the text, some misspelling/errors may have occurred.  Orders Placed This Encounter  Procedures  . CT Soft Tissue Neck W Contrast    Standing Status:   Future    Standing Expiration Date:   06/08/2018    Order Specific Question:   If indicated for the ordered procedure, I authorize the administration of contrast media per Radiology protocol    Answer:   Yes    Order Specific Question:   Preferred imaging location?    Answer:   [facility]    Order Specific Question:   Radiology Contrast Protocol - do NOT remove file path    Answer:   [email or web address]    Order Specific Question:   Reason for Exam additional comments    Answer:   CLL new diagnosis, please eval LAD/splenomegaly  . CT Chest W Contrast    Standing Status:   Future    Standing Expiration Date:   06/08/2018    Order Specific Question:   If indicated for the ordered procedure, I authorize the administration of contrast media per Radiology protocol    Answer:   Yes    Order Specific Question:   Preferred imaging location?    Answer:   [facility]    Order Specific Question:   Radiology Contrast Protocol - do NOT remove file path    Answer:   [email or web address]    Order Specific Question:   Reason for Exam additional comments    Answer:   CLL new diagnosis, please eval  LAD/splenomegaly  . CT Abdomen Pelvis W Contrast    Standing Status:   Future    Standing Expiration Date:   06/08/2018    Order Specific Question:   If indicated for the ordered procedure, I authorize the administration of contrast media per Radiology protocol    Answer:   Yes    Order Specific Question:   Preferred imaging location?    Answer:   [facility]    Order Specific Question:   Radiology Contrast Protocol - do NOT remove file path    Answer:   [email or web address]    Order Specific Question:   Reason for Exam additional comments    Answer:   CLL new diagnosis, please eval LAD/splenomegaly  . Cytogenetics, Peripheral Blood    Standing Status:   Future    Number of Occurrences:   1    Standing Expiration Date:   06/08/2018  . FISH, CLL Prognostic Panel    Standing Status:   Future    Number of Occurrences:   1    Standing Expiration Date:   06/08/2018  . QIG  (Quant. immunoglobulins  - IgG, IgA, IgM)    Standing Status:   Future    Number of Occurrences:   1    Standing Expiration Date:   06/08/2018    Cancer Staging No matching staging information was found for the patient.  All questions were answered.  . The  patient knows to call the clinic with any problems, questions or concerns.  This note was electronically signed.    History of Presenting Illness Tony Banks is a 76 y.o. followed in the [facility] for Chronic Lymphocytic Leukemia. On review of the labs, leukocytosis has been present since 2015, please see the blood count trends outlined below.   At this time, patient reports no fevers, chills, unexpected weight loss or change in energy level. He does complain off significant night sweats. He occasionally has pain in the posterior thighs and pain in the bones of his legs. The pain usually is short-lived. Complains of chronic back pain. He also complains of numbness in bilateral feet for the past 2 years. Denies any chest pain,  shortness of breath, or cough. No, dysphasia, early satiety, abdominal pain, diarrhea, or constipation. No dysuria or hematuria. No focal weakness in the extremities or face.  Recent returns to the clinic today to review our lab work findings. Denies any new complaints since last visit to the clinic.  Oncological/hematological History: --Labs, 04/27/14: WBC   7.5, ALC   ..., Hgb 12.3, Plt 181; --Labs, 04/06/15: WBC 10.7, ALC   ..., Hgb 13.8, Plt 210; --Labs, 04/27/16: WBC 13.5, ALC   ..., Hgb 13.4, Plt 185; --Labs, 04/19/17: WBC 14.7, ALC 8.1, Hgb 13.5, Plt 186; Smear -- atypical lymphocytes present --Labs, 05/29/17: WBC 16.3, ALC 8.8, Hgb 12.8, plt 151; LDH 165, beta-2 microglobulin 1.6; Flow Cyto -- monoclonal B cells present, positive for CD19, CD20, CD5, CD23 comprising 73% of all lymphocytes.   Medical History: Past Medical History:  Diagnosis Date  . Constipation    severe  . Diabetes mellitus without complication ([name])   . Nephrolithiasis     Surgical History: Past Surgical History:  Procedure Laterality Date  . FRACTURE SURGERY    . SHOULDER ARTHROSCOPY     bilat  . VASECTOMY      Family History: Family History  Problem Relation Age of Onset  . Heart disease Mother   . Stroke Mother   . Cancer Father   . Stroke Maternal Grandfather   . Colon cancer Neg Hx   . Pancreatic cancer Neg Hx   . Rectal cancer Neg Hx   . Stomach cancer Neg Hx     Social History: Social History   Social History  . Marital status: Married    Spouse name: N/A  . Number of children: N/A  . Years of education: N/A   Occupational History  . Not on file.   Social History Main Topics  . Smoking status: Former Research scientist (life sciences)  . Smokeless tobacco: Never Used     Comment: quit 30 yrs ago  . Alcohol use No  . Drug use: No  . Sexual activity: No   Other Topics Concern  . Not on file   Social History Narrative  . No narrative on file    Allergies: Allergies  Allergen Reactions  .  Shellfish Allergy Other (See Comments)    Leg swelling     Medications:  Current Outpatient Prescriptions  Medication Sig Dispense Refill  . ACCU-CHEK SOFTCLIX LANCETS lancets Use as instructed 100 each 12  . AMBULATORY NON FORMULARY MEDICATION Accu-chek aviva plus test strips and accu-chek softclix lancets, use to check blood sugar up to four times a day. Dx Type 2 diabetes. 100 Units 3  . atorvastatin (LIPITOR) 10 MG tablet Take 1 tablet (10 mg total) by mouth daily. 90 tablet 3  . glucose blood (  ACCU-CHEK AVIVA PLUS) test strip USE TO CHECK BLOOD SUGAR UP TO 4 TIMES A DAY 100 each 12  . metFORMIN (GLUCOPHAGE) 500 MG tablet Take 1 tablet (500 mg total) by mouth daily with breakfast. 180 tablet 3  . Omega-3 Fatty Acids (FISH OIL PO) Take by mouth.    . linaclotide (LINZESS) 145 MCG CAPS capsule Take 1 capsule (145 mcg total) by mouth daily before breakfast. (Patient not taking: Reported on 06/08/2017) 30 capsule 2   No current facility-administered medications for this visit.     Review of Systems: Review of Systems  Constitutional: Positive for diaphoresis. Negative for appetite change, chills, fatigue and fever.  HENT:  Negative.   Eyes: Negative.   Respiratory: Negative.   Cardiovascular: Negative.   Gastrointestinal: Negative.   Endocrine: Negative.   Genitourinary: Negative.    Musculoskeletal: Positive for arthralgias and back pain.  Skin: Negative.   Neurological: Positive for numbness. Negative for extremity weakness.  Hematological: Negative.   Psychiatric/Behavioral: Negative.      PHYSICAL EXAMINATION Blood pressure (!) 152/62, pulse 78, temperature 97.9 F (36.6 C), temperature source Oral, resp. rate 17, height 5\' 7"  (1.702 m), weight 154 lb 1.6 oz (69.9 kg), SpO2 99 %.  ECOG PERFORMANCE STATUS: 1 - Symptomatic but completely ambulatory  Physical Exam  Constitutional: He is oriented to person, place, and time and well-developed, well-nourished, and in no  distress. No distress.  HENT:  Head: Normocephalic and atraumatic.  Mouth/Throat: Oropharynx is clear and moist. No oropharyngeal exudate.  Eyes: Pupils are equal, round, and reactive to light. Conjunctivae and EOM are normal. No scleral icterus.  Neck: Normal range of motion. No thyromegaly present.  Cardiovascular: Normal rate, regular rhythm and normal heart sounds.  Exam reveals no gallop and no friction rub.   No murmur heard. Pulmonary/Chest: Effort normal and breath sounds normal. No respiratory distress. He has no wheezes. He has no rales. He exhibits no tenderness.  Abdominal: Soft. Bowel sounds are normal. He exhibits no distension. There is no tenderness.  Palpable fullness in the left upper quadrant of the abdomen with significant dullness to percussion. I cannot distinctly feel the edge of the spleen, but does appear to be significantly enlarged.  Musculoskeletal: Normal range of motion. He exhibits no edema or deformity.  Lymphadenopathy:       Head (right side): No submandibular and no occipital adenopathy present.       Head (left side): No submandibular and no occipital adenopathy present.    He has no cervical adenopathy.    He has no axillary adenopathy.       Right: No inguinal and no supraclavicular adenopathy present.       Left: No inguinal and no supraclavicular adenopathy present.  Neurological: He is alert and oriented to person, place, and time. He has normal reflexes. No cranial nerve deficit.  Skin: Skin is warm and dry. No rash noted. He is not diaphoretic. No erythema. No pallor.     LABORATORY DATA: I have personally reviewed the data as listed: Appointment on 06/08/2017  Component Date Value Ref Range Status  . IgG, Qn, Serum 06/08/2017 911  700 - 1,600 mg/dL Final  . IgA, Qn, Serum 06/08/2017 128  61 - 437 mg/dL Final  . IgM, Qn, Serum 06/08/2017 83  15 - 143 mg/dL Final       Ardath Sax, MD

## 2017-06-14 LAB — CYTOGENETICS, PERIPHERAL BLOOD

## 2017-06-20 ENCOUNTER — Encounter (HOSPITAL_COMMUNITY): Payer: Self-pay

## 2017-06-20 ENCOUNTER — Ambulatory Visit (HOSPITAL_COMMUNITY)
Admission: RE | Admit: 2017-06-20 | Discharge: 2017-06-20 | Disposition: A | Discharge status: Home / Self Care | Payer: Medicare HMO | Source: Ambulatory Visit | Attending: Hematology and Oncology | Admitting: Hematology and Oncology

## 2017-06-20 DIAGNOSIS — R599 Enlarged lymph nodes, unspecified: Secondary | ICD-10-CM | POA: Diagnosis not present

## 2017-06-20 DIAGNOSIS — N400 Enlarged prostate without lower urinary tract symptoms: Secondary | ICD-10-CM | POA: Insufficient documentation

## 2017-06-20 DIAGNOSIS — I700 Atherosclerosis of aorta: Secondary | ICD-10-CM | POA: Insufficient documentation

## 2017-06-20 DIAGNOSIS — J432 Centrilobular emphysema: Secondary | ICD-10-CM | POA: Diagnosis not present

## 2017-06-20 DIAGNOSIS — I2510 Atherosclerotic heart disease of native coronary artery without angina pectoris: Secondary | ICD-10-CM | POA: Diagnosis not present

## 2017-06-20 DIAGNOSIS — C9190 Lymphoid leukemia, unspecified not having achieved remission: Secondary | ICD-10-CM | POA: Insufficient documentation

## 2017-06-20 DIAGNOSIS — E041 Nontoxic single thyroid nodule: Secondary | ICD-10-CM | POA: Diagnosis not present

## 2017-06-20 DIAGNOSIS — C9110 Chronic lymphocytic leukemia of B-cell type not having achieved remission: Secondary | ICD-10-CM

## 2017-06-20 DIAGNOSIS — R590 Localized enlarged lymph nodes: Secondary | ICD-10-CM | POA: Diagnosis not present

## 2017-06-20 DIAGNOSIS — J439 Emphysema, unspecified: Secondary | ICD-10-CM | POA: Diagnosis not present

## 2017-06-20 IMAGING — CT CT NECK W/ CM
4 of 10 series · 11 of 33 positions shown, 12 images · IV contrast (agent unspecified)
Comparison: CT abdomen and pelvis [DATE]

CLINICAL DATA: Chronic lymphocytic leukemia.  Night sweats, fatigue

EXAM:
CT OF THE NECK WITH CONTRAST
CT OF THE CHEST WITH CONTRAST
CT OF THE ABDOMEN AND PELVIS WITH CONTRAST
TECHNIQUE: Multidetector CT imaging of the neck was performed with intravenous
contrast.; Multidetector CT imaging of the abdomen and pelvis was
performed following the standard protocol during bolus
administration of intravenous contrast.; Multidetector CT imaging of
the chest was performed following the standard protocol during bolus
administration of intravenous contrast.

[Series 4: lung · axial · 0.75mm/px · z∈[-370,-256]mm · 2 of 172 slices shown]
[im 58/172  bone]
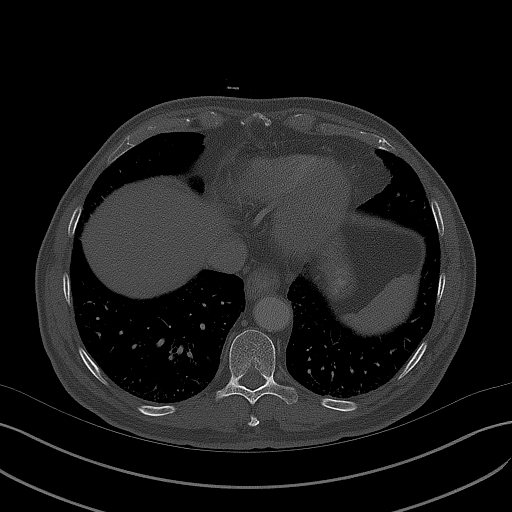
[im 115/172  bone]
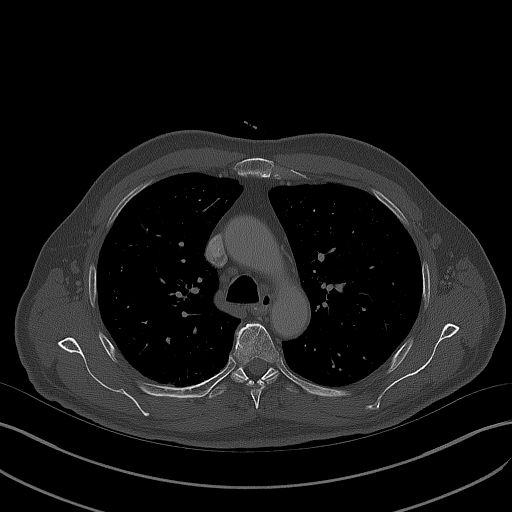

[Series 5: coronals · coronal · 0.85mm/px · 1 of 146 slices shown]
[im 73/146  bone]
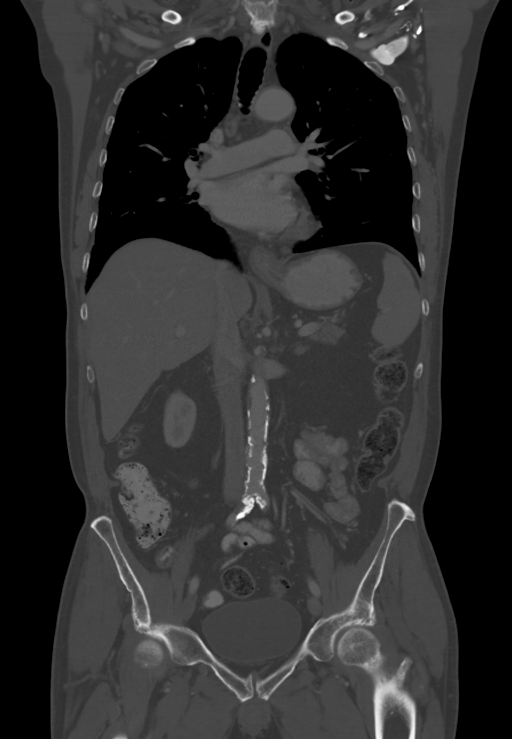

[Series 6: sagittals · sagittal · 0.69mm/px · 5 of 202 slices shown]
[im 29/202  bone]
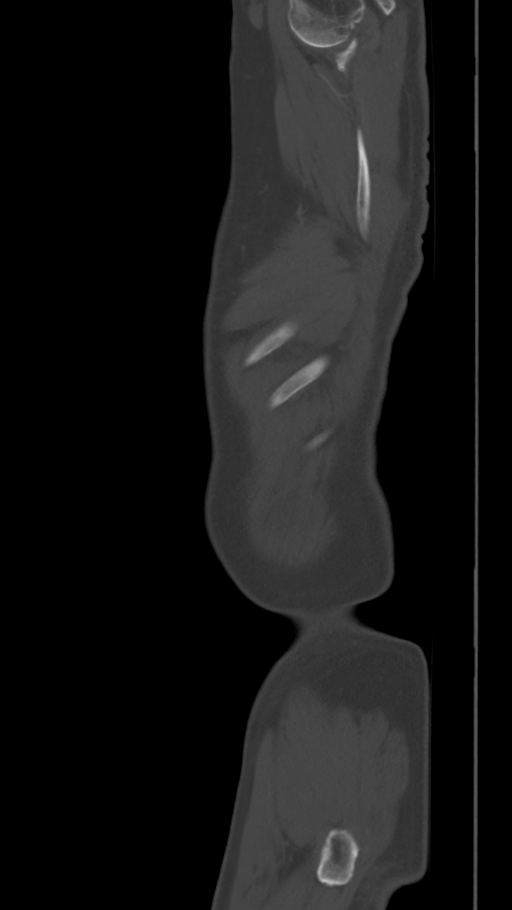
[im 58/202  bone]
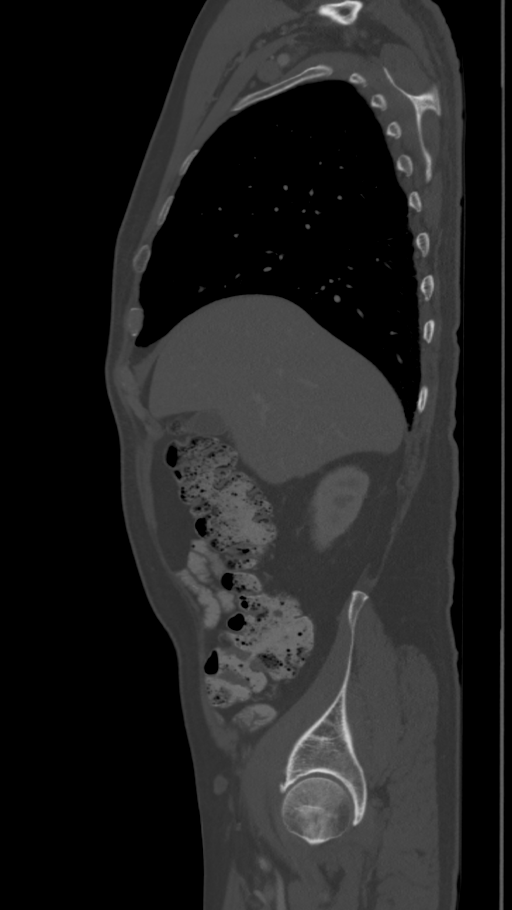
[im 87/202  bone]
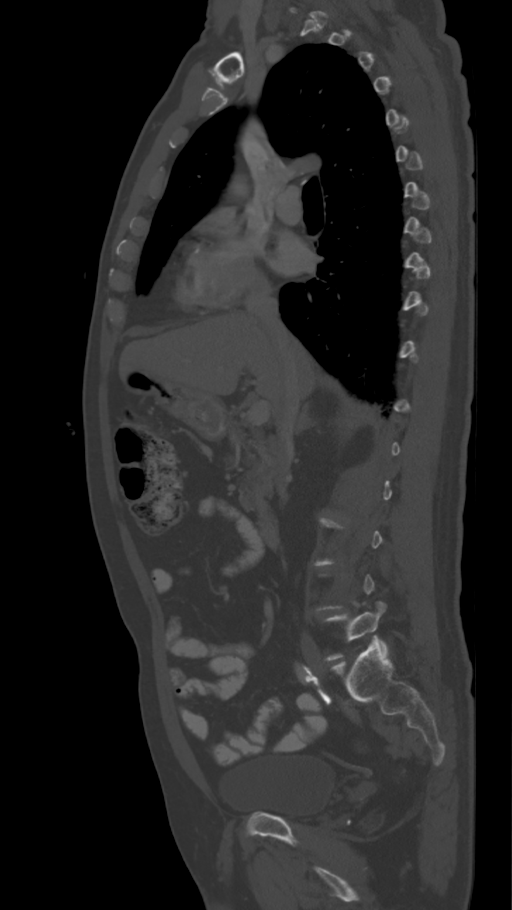
[im 115/202  bone]
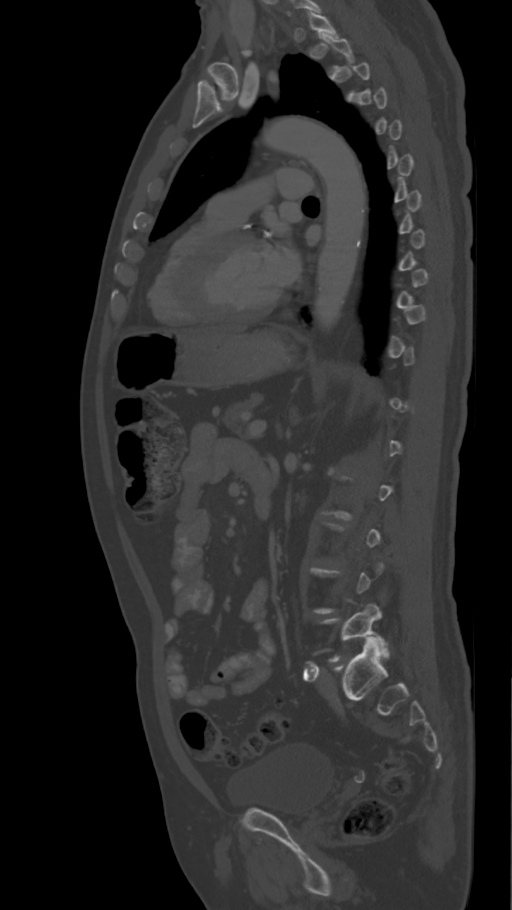
[im 144/202  bone]
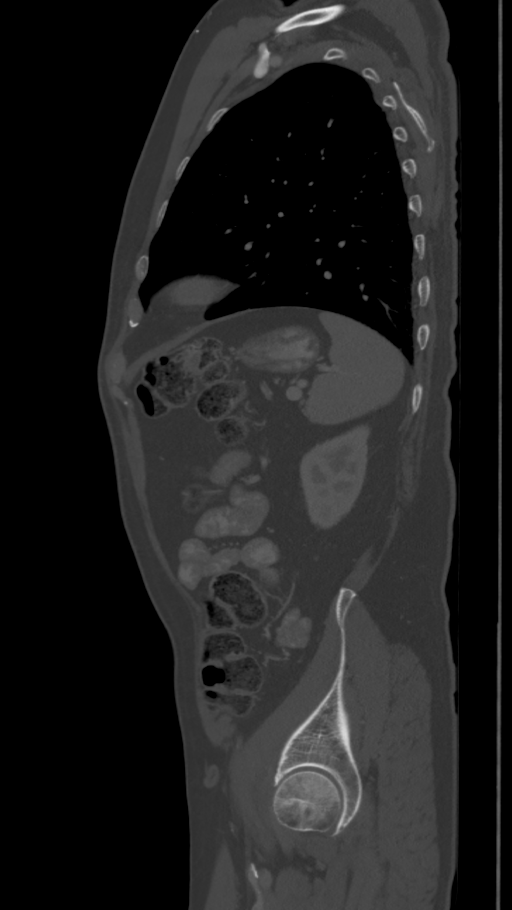

[Series 7: axial neck · axial · 0.49mm/px · z∈[-268,-26]mm · 3 of 122 slices shown, 4 images]
[im 1/122  soft-tissue]
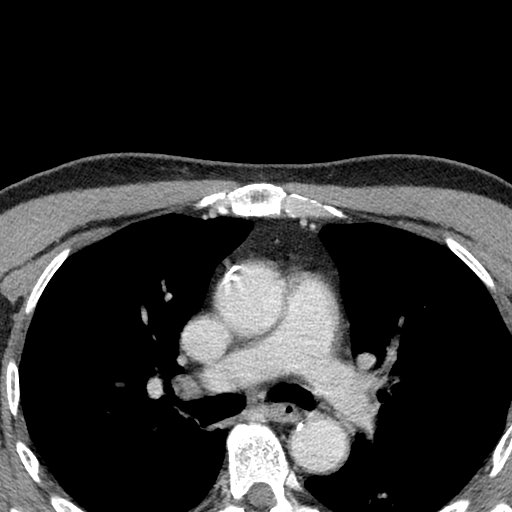
[im 1/122  bone]
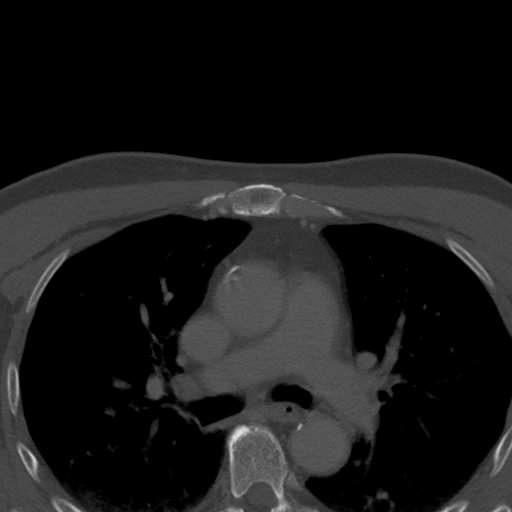
[im 61/122  bone]
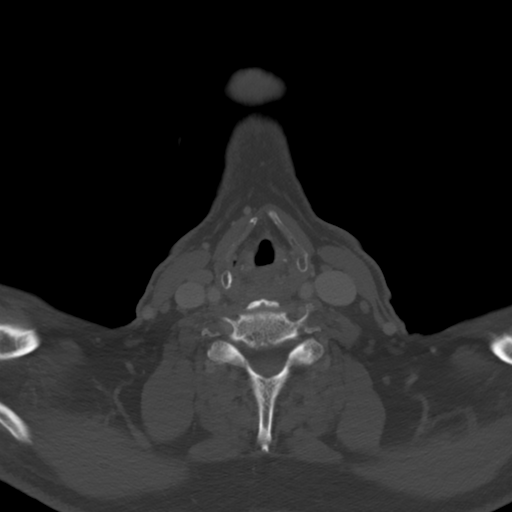
[im 122/122  bone]
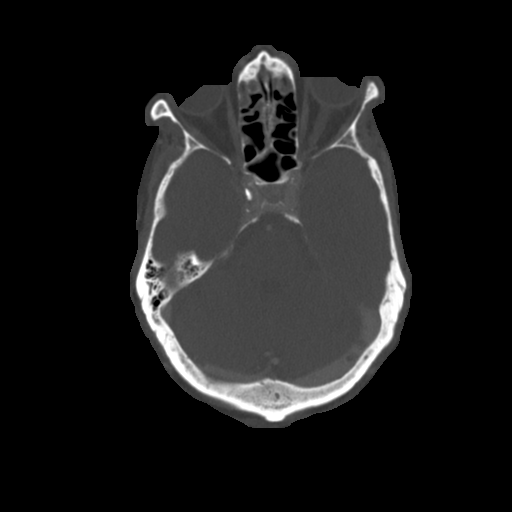

[11 of 33 positions shown; findings below may reference images not displayed]

FINDINGS: CT NECK FINDINGS

Pharynx and larynx: Normal. No mass or swelling.

Salivary glands: No inflammation, mass, or stone.

Thyroid: Small nodule in the superior pole of the left thyroid lobe
measures 8 mm.

Lymph nodes: Small scattered non pathologically enlarged lymph
nodes. Index right posterior chain lymph node has a short axis
diameter of 5 mm. Small right high paratracheal lymph node on image
85 has a short axis diameter of 6 mm.

Vascular: Proximal great vessel calcifications. No visible
significant stenosis.

Limited intracranial: Negative.

Visualized orbits: Negative.

Mastoids and visualized paranasal sinuses: Mucosal thickening in the
ethmoid air cells. No air-fluid levels. Mastoid air cells clear.

Skeleton: No acute bony abnormality. Degenerative changes in the
cervical spine.

CT CHEST FINDINGS

Cardiovascular: Moderate coronary artery calcifications in all 3
major coronary vessels. Heart is normal size. Scattered aortic
calcifications. No aneurysm.

Mediastinum/Nodes: Small scattered mediastinal, bilateral hilar and
bilateral axillary nodes, none pathologically enlarged. Index lower
right paratracheal lymph node has a short axis diameter of 7 mm.
Trachea and esophagus are unremarkable.

Lungs/Pleura: Mild centrilobular emphysema. No pulmonary nodules or
pleural effusions. No confluent airspace opacities. Small calcified
granuloma in the anterior right upper lobe.

Musculoskeletal: No acute bony abnormality. Degenerative spurring in
the upper thoracic spine. No focal bone lesion.

CT ABDOMEN AND PELVIS FINDINGS

Hepatobiliary: No focal hepatic abnormality. Gallbladder
unremarkable.

Pancreas: No focal abnormality or ductal dilatation.

Spleen: Spleen is normal size with a craniocaudal length of 10.4 cm.
No focal splenic abnormality.

Adrenals/Urinary Tract: No adrenal abnormality. No focal renal
abnormality. No stones or hydronephrosis. Urinary bladder is
unremarkable.

Stomach/Bowel: Moderate stool burden throughout the colon. Stomach,
large and small bowel grossly unremarkable.

Vascular/Lymphatic: Heavily calcified aorta and iliac vessels. No
aneurysm. Mildly enlarged porta hepatis/right upper quadrant lymph
nodes. Precaval lymph node has a short axis diameter of 11 mm on
image 60. Portacaval lymph node has a short axis diameter of 14 mm
on image 59. Small scattered inguinal lymph nodes, none
pathologically enlarged. Index left inguinal lymph node on image 128
has a short axis diameter of 8 mm. No

Reproductive: Mild prostate enlargement with central calcifications.

Other: No free fluid or free air.

Musculoskeletal: No acute bony abnormality or focal bone lesion.
IMPRESSION: Mildly enlarged porta hepatis/right upper quadrant lymph nodes.
Largest portacaval lymph node has a short axis diameter of 14 mm. No
other enlarged lymph nodes in the neck, chest, abdomen or pelvis.
These are nonspecific and given the location can be related to
intrinsic liver disease although no visible abnormality noted within
the liver on CT.

No evidence of splenomegaly.

Aortic atherosclerosis.  Coronary artery disease.

8 mm superior pole left thyroid nodule. This could be further
characterized with elective thyroid ultrasound.

## 2017-06-20 MED ORDER — IOPAMIDOL (ISOVUE-300) INJECTION 61%
100.0000 mL | Freq: Once | INTRAVENOUS | Status: AC | PRN
Start: 2017-06-20 — End: 2017-06-20
  Administered 2017-06-20: 100 mL via INTRAVENOUS

## 2017-06-20 MED ORDER — IOPAMIDOL (ISOVUE-300) INJECTION 61%
INTRAVENOUS | Status: AC
  Administered 2017-06-20: 100 mL via INTRAVENOUS
  Filled 2017-06-20: qty 100

## 2017-06-22 ENCOUNTER — Ambulatory Visit (HOSPITAL_BASED_OUTPATIENT_CLINIC_OR_DEPARTMENT_OTHER): Payer: Medicare HMO | Admitting: Hematology and Oncology

## 2017-06-22 ENCOUNTER — Encounter: Payer: Self-pay | Admitting: Hematology and Oncology

## 2017-06-22 ENCOUNTER — Telehealth: Payer: Self-pay | Admitting: Hematology and Oncology

## 2017-06-22 VITALS — BP 161/74 | HR 81 | Temp 98.7°F | Resp 18 | Ht 67.0 in | Wt 155.6 lb

## 2017-06-22 DIAGNOSIS — G8929 Other chronic pain: Secondary | ICD-10-CM

## 2017-06-22 DIAGNOSIS — M545 Low back pain: Secondary | ICD-10-CM

## 2017-06-22 DIAGNOSIS — C9110 Chronic lymphocytic leukemia of B-cell type not having achieved remission: Secondary | ICD-10-CM | POA: Diagnosis not present

## 2017-06-22 DIAGNOSIS — R61 Generalized hyperhidrosis: Secondary | ICD-10-CM

## 2017-06-22 DIAGNOSIS — R200 Anesthesia of skin: Secondary | ICD-10-CM

## 2017-06-22 NOTE — Telephone Encounter (Signed)
Scheduled appt per 10/18 los - Gave patient AVS and calender per los.  

## 2017-06-23 LAB — FISH,CLL PROGNOSTIC PANEL

## 2017-06-28 NOTE — Assessment & Plan Note (Signed)
76 y.o. male with a confirmed diagnosis of chronic lymphocytic leukemia, now having completed the staging evaluation. Patient is diagnosed with Rai 1 disease based on presence of lymphocytosis and mild lymphadenopathy without splenomegaly, anemia, or thrombocytopenia. Cytogenetics and FISH are negative for presence of high risk indicators, suggesting presence of intermediate prognosis disease.  Patient has no significant symptoms attributable to his hematological condition.  At this time, I do not recommend any disease - direct therapy, prefer to observe patient.  Plan: --Labs prior to RTC --RTC 3 months for progression monitoring

## 2017-06-28 NOTE — Progress Notes (Signed)
[facility] Follow-up Visit:  Assessment: CLL (chronic lymphocytic leukemia) ([place]) 76 y.o. male with a confirmed diagnosis of chronic lymphocytic leukemia, now having completed the staging evaluation. Patient is diagnosed with Rai 1 disease based on presence of lymphocytosis and mild lymphadenopathy without splenomegaly, anemia, or thrombocytopenia. Cytogenetics and FISH are negative for presence of high risk indicators, suggesting presence of intermediate prognosis disease.  Patient has no significant symptoms attributable to his hematological condition.  At this time, I do not recommend any disease - direct therapy, prefer to observe patient.  Plan: --Labs prior to RTC --RTC 3 months for progression monitoring  Voice recognition software was used and creation of this note. Despite my best effort at editing the text, some misspelling/errors may have occurred.  Orders Placed This Encounter  Procedures  . CBC with Differential    Standing Status:   Future    Standing Expiration Date:   06/22/2018  . Comprehensive metabolic panel    Standing Status:   Future    Standing Expiration Date:   06/22/2018  . Lactate dehydrogenase (LDH)    Standing Status:   Future    Standing Expiration Date:   06/22/2018  . QIG  (Quant. immunoglobulins  - IgG, IgA, IgM)    Standing Status:   Future    Standing Expiration Date:   06/22/2018    Cancer Staging No matching staging information was found for the patient.  All questions were answered.  . The patient knows to call the clinic with any problems, questions or concerns.  This note was electronically signed.    History of Presenting Illness Tony Banks is a 76 y.o. followed in the [facility] for Chronic Lymphocytic Leukemia. On review of the labs, leukocytosis has been present since 2015, please see the blood count trends outlined below.   At this time, patient reports no fevers, chills, unexpected weight loss or  change in energy level. He does complain off significant night sweats. He occasionally has pain in the posterior thighs and pain in the bones of his legs. The pain usually is short-lived. Complains of chronic back pain. He also complains of numbness in bilateral feet for the past 2 years. Denies any chest pain, shortness of breath, or cough. No, dysphasia, early satiety, abdominal pain, diarrhea, or constipation. No dysuria or hematuria. No focal weakness in the extremities or face.  Recent returns to the clinic today to review our lab work findings. Denies any new complaints since last visit to the clinic.  Oncological/hematological History: --Labs, 04/27/14: WBC   7.5, ALC   ..., Hgb 12.3, Plt 181; --Labs, 04/06/15: WBC 10.7, ALC   ..., Hgb 13.8, Plt 210; --Labs, 04/27/16: WBC 13.5, ALC   ..., Hgb 13.4, Plt 185; --Labs, 04/19/17: WBC 14.7, ALC 8.1, Hgb 13.5, Plt 186; Smear -- atypical lymphocytes present --Labs, 05/29/17: WBC 16.3, ALC 8.8, Hgb 12.8, Plt 151; LDH 165, beta-2 microglobulin 1.6; IgG 911, IgA 128, IgM 83; Flow Cyto -- monoclonal B cells present, positive for CD19, CD20, CD5, CD23 comprising 73% of all lymphocytes; Cytogenetics/FISH -- Normal cytogenetics, FISH negative for del17p, del11q, del13q .  --CT C/A/P, 06/20/17: Mildly enlarged porta hepatis/right upper quadrant lymph nodes. Largest portacaval lymph node has a short axis diameter of 14 mm. No other enlarged lymph nodes in the neck, chest, abdomen or pelvis. Spleen is normal size with a craniocaudal length of 10.4 cm. No focal splenic abnormality.   Medical History: Past Medical History:  Diagnosis Date  .  Constipation    severe  . Diabetes mellitus without complication ([place])   . Nephrolithiasis     Surgical History: Past Surgical History:  Procedure Laterality Date  . FRACTURE SURGERY    . SHOULDER ARTHROSCOPY     bilat  . VASECTOMY      Family History: Family History  Problem Relation Age of Onset  . Heart  disease Mother   . Stroke Mother   . Cancer Father   . Stroke Maternal Grandfather   . Colon cancer Neg Hx   . Pancreatic cancer Neg Hx   . Rectal cancer Neg Hx   . Stomach cancer Neg Hx     Social History: Social History   Social History  . Marital status: Married    Spouse name: N/A  . Number of children: N/A  . Years of education: N/A   Occupational History  . Not on file.   Social History Main Topics  . Smoking status: Former Research scientist (life sciences)  . Smokeless tobacco: Never Used     Comment: quit 30 yrs ago  . Alcohol use No  . Drug use: No  . Sexual activity: No   Other Topics Concern  . Not on file   Social History Narrative  . No narrative on file    Allergies: Allergies  Allergen Reactions  . Shellfish Allergy Other (See Comments)    Leg swelling     Medications:  Current Outpatient Prescriptions  Medication Sig Dispense Refill  . ACCU-CHEK SOFTCLIX LANCETS lancets Use as instructed 100 each 12  . AMBULATORY NON FORMULARY MEDICATION Accu-chek aviva plus test strips and accu-chek softclix lancets, use to check blood sugar up to four times a day. Dx Type 2 diabetes. 100 Units 3  . atorvastatin (LIPITOR) 10 MG tablet Take 1 tablet (10 mg total) by mouth daily. 90 tablet 3  . glucose blood (ACCU-CHEK AVIVA PLUS) test strip USE TO CHECK BLOOD SUGAR UP TO 4 TIMES A DAY 100 each 12  . linaclotide (LINZESS) 145 MCG CAPS capsule Take 1 capsule (145 mcg total) by mouth daily before breakfast. 30 capsule 2  . metFORMIN (GLUCOPHAGE) 500 MG tablet Take 1 tablet (500 mg total) by mouth daily with breakfast. 180 tablet 3  . Omega-3 Fatty Acids (FISH OIL PO) Take by mouth.     No current facility-administered medications for this visit.     Review of Systems: Review of Systems  Constitutional: Positive for diaphoresis. Negative for appetite change, chills, fatigue and fever.  HENT:  Negative.   Eyes: Negative.   Respiratory: Negative.   Cardiovascular: Negative.    Gastrointestinal: Negative.   Endocrine: Negative.   Genitourinary: Negative.    Musculoskeletal: Positive for arthralgias and back pain.  Skin: Negative.   Neurological: Positive for numbness. Negative for extremity weakness.  Hematological: Negative.   Psychiatric/Behavioral: Negative.      PHYSICAL EXAMINATION Blood pressure (!) 161/74, pulse 81, temperature 98.7 F (37.1 C), temperature source Oral, resp. rate 18, height '5\' 7"'$  (1.702 m), weight 155 lb 9.6 oz (70.6 kg), SpO2 99 %.  ECOG PERFORMANCE STATUS: 1 - Symptomatic but completely ambulatory  Physical Exam  Constitutional: He is oriented to person, place, and time and well-developed, well-nourished, and in no distress. No distress.  HENT:  Head: Normocephalic and atraumatic.  Mouth/Throat: Oropharynx is clear and moist. No oropharyngeal exudate.  Eyes: Pupils are equal, round, and reactive to light. Conjunctivae and EOM are normal. No scleral icterus.  Neck: Normal range of motion. No  thyromegaly present.  Cardiovascular: Normal rate, regular rhythm and normal heart sounds.  Exam reveals no gallop and no friction rub.   No murmur heard. Pulmonary/Chest: Effort normal and breath sounds normal. No respiratory distress. He has no wheezes. He has no rales. He exhibits no tenderness.  Abdominal: Soft. Bowel sounds are normal. He exhibits no distension. There is no tenderness.  Palpable fullness in the left upper quadrant of the abdomen with significant dullness to percussion. I cannot distinctly feel the edge of the spleen, but does appear to be significantly enlarged.  Musculoskeletal: Normal range of motion. He exhibits no edema or deformity.  Lymphadenopathy:       Head (right side): No submandibular and no occipital adenopathy present.       Head (left side): No submandibular and no occipital adenopathy present.    He has no cervical adenopathy.    He has no axillary adenopathy.       Right: No inguinal and no  supraclavicular adenopathy present.       Left: No inguinal and no supraclavicular adenopathy present.  Neurological: He is alert and oriented to person, place, and time. He has normal reflexes. No cranial nerve deficit.  Skin: Skin is warm and dry. No rash noted. He is not diaphoretic. No erythema. No pallor.     LABORATORY DATA: I have personally reviewed the data as listed: No visits with results within 1 Week(s) from this visit.  Latest known visit with results is:  Appointment on 06/08/2017  Component Date Value Ref Range Status  . Cytogenetics, Peripheral Blood 06/08/2017 Report given to physician.   Final  . Test Methodology 06/08/2017 [place]   Final  . Fish, CLL 06/08/2017 See report in EPIC.   Final  . IgG, Qn, Serum 06/08/2017 911  700 - 1,600 mg/dL Final  . IgA, Qn, Serum 06/08/2017 128  61 - 437 mg/dL Final  . IgM, Qn, Serum 06/08/2017 83  15 - 143 mg/dL Final       Ardath Sax, MD

## 2017-06-30 LAB — TISSUE HYBRIDIZATION TO NCBH

## 2017-07-11 ENCOUNTER — Other Ambulatory Visit: Payer: Self-pay | Admitting: Osteopathic Medicine

## 2017-07-11 MED ORDER — LINACLOTIDE 145 MCG PO CAPS: 145.0000 ug | ORAL_CAPSULE | Freq: Every day | ORAL | 2 refills | Status: DC

## 2017-07-11 NOTE — Addendum Note (Signed)
Addended by: Narda Rutherford on: 07/11/2017 10:06 AM   Modules accepted: Orders

## 2017-07-18 ENCOUNTER — Ambulatory Visit: Payer: Medicare HMO | Admitting: Family Medicine

## 2017-07-18 ENCOUNTER — Encounter: Payer: Self-pay | Admitting: Family Medicine

## 2017-07-18 VITALS — BP 140/74 | Temp 98.3°F | Ht 68.0 in | Wt 147.0 lb

## 2017-07-18 DIAGNOSIS — K5900 Constipation, unspecified: Secondary | ICD-10-CM

## 2017-07-18 MED ORDER — PEG 3350-KCL-NABCB-NACL-NASULF 236 G PO SOLR: ORAL | 0 refills | Status: DC

## 2017-07-18 NOTE — Patient Instructions (Signed)
Thank you for coming in today. Either take the [place] prep divided in the evening and the morning or do the Mialarax/Gatoraid prep that you did before.  Let me or Dr Sheppard Coil know if not better.  Next step would be either gastroenterology or hospital.  Try to get some fiber in your diet.    Constipation, Adult Constipation is when a person:  Poops (has a bowel movement) fewer times in a week than normal.  Has a hard time pooping.  Has poop that is dry, hard, or bigger than normal.  Follow these instructions at home: Eating and drinking   Eat foods that have a lot of fiber, such as: ? Fresh fruits and vegetables. ? Whole grains. ? Beans.  Eat less of foods that are high in fat, low in fiber, or overly processed, such as: ? [place] fries. ? Hamburgers. ? Cookies. ? Candy. ? Soda.  Drink enough fluid to keep your pee (urine) clear or pale yellow. General instructions  Exercise regularly or as told by your doctor.  Go to the restroom when you feel like you need to poop. Do not hold it in.  Take over-the-counter and prescription medicines only as told by your doctor. These include any fiber supplements.  Do pelvic floor retraining exercises, such as: ? Doing deep breathing while relaxing your lower belly (abdomen). ? Relaxing your pelvic floor while pooping.  Watch your condition for any changes.  Keep all follow-up visits as told by your doctor. This is important. Contact a doctor if:  You have pain that gets worse.  You have a fever.  You have not pooped for 4 days.  You throw up (vomit).  You are not hungry.  You lose weight.  You are bleeding from the anus.  You have thin, pencil-like poop (stool). Get help right away if:  You have a fever, and your symptoms suddenly get worse.  You leak poop or have blood in your poop.  Your belly feels hard or bigger than normal (is bloated).  You have very bad belly pain.  You feel dizzy or you  faint. This information is not intended to replace advice given to you by your health care provider. Make sure you discuss any questions you have with your health care provider. Document Released: 02/08/2008 Document Revised: 03/11/2016 Document Reviewed: 02/10/2016 Elsevier Interactive Patient Education  2017 [employer].

## 2017-07-18 NOTE — Progress Notes (Signed)
Tony Banks is a 76 y.o. male who presents to [facility] today for constipation of approximately 4 weeks duration. Patient has not had substantial bowel movement in 4 weeks. Largest bowel movement was the size of a raisin, several days ago. Patient is having diffuse tenderness, worse in his right middle quadrant.  Patient has tried several over the counter regimens over the past month, including Mirilax, dulcolax, fleets enemas, several suppositories, and Linzess. Linzess has worked in the past the best, but patient was unresponsive this time. When he takes medications, he will have increased gurgling and bowel sounds, but this only results in flatus and watery stool without formed stools.  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 Patient has been admitted to hospital in the past due to similarly severe constipation. Patient had multiple enemas and required PEG solution to pass stool.  He is not on any medications typically associated with this degree of constipation.   Past Medical History:  Diagnosis Date  . Constipation    severe  . Diabetes mellitus without complication ([place])   . Nephrolithiasis    Past Surgical History:  Procedure Laterality Date  . FRACTURE SURGERY    . SHOULDER ARTHROSCOPY     bilat  . VASECTOMY     Social History   Tobacco Use  . Smoking status: Former Research scientist (life sciences)  . Smokeless tobacco: Never Used  . Tobacco comment: quit 30 yrs ago  Substance Use Topics  . Alcohol use: No   family history includes Cancer in his father; Heart disease in his mother; Stroke in his maternal grandfather and mother.  ROS as above:  Medications: Current Outpatient Medications  Medication Sig Dispense Refill  . ACCU-CHEK SOFTCLIX LANCETS lancets Use as instructed 100 each 12  .  AMBULATORY NON FORMULARY MEDICATION Accu-chek aviva plus test strips and accu-chek softclix lancets, use to check blood sugar up to four times a day. Dx Type 2 diabetes. 100 Units 3  . atorvastatin (LIPITOR) 10 MG tablet Take 1 tablet (10 mg total) by mouth daily. 90 tablet 3  . glucose blood (ACCU-CHEK AVIVA PLUS) test strip USE TO CHECK BLOOD SUGAR UP TO 4 TIMES A DAY 100 each 12  . linaclotide (LINZESS) 145 MCG CAPS capsule Take 1 capsule (145 mcg total) daily before breakfast by mouth. 30 capsule 2  . metFORMIN (GLUCOPHAGE) 500 MG tablet Take 1 tablet (500 mg total) by mouth daily with breakfast. 180 tablet 3  . Omega-3 Fatty Acids (FISH OIL PO) Take by mouth.    . polyethylene glycol (GOLYTELY) 236 g solution Take 12 oz every 10 mins until half gone. Finish rest in the morning. 4000 mL 0   No current facility-administered medications for this visit.    Allergies  Allergen Reactions  . Shellfish Allergy Other (See Comments)    Leg swelling     Health Maintenance Health Maintenance  Topic Date Due  . URINE MICROALBUMIN  10/28/2015  . OPHTHALMOLOGY EXAM  07/07/2016  . FOOT EXAM  04/26/2017  . TETANUS/TDAP  04/05/2025 (Originally 11/18/1959)  . HEMOGLOBIN A1C  10/20/2017  . COLONOSCOPY  05/22/2024  . INFLUENZA VACCINE  Completed  . PNA vac Low Risk Adult  Completed     Exam:  BP 140/74   Temp 98.3 F (36.8 C) (Oral)   Ht 5\' 8"  (1.727 m)   Wt 147  lb 0.2 oz (66.7 kg)   BMI 22.35 kg/m  Gen: Well NAD HEENT: EOMI,  MMM Lungs: Normal work of breathing. CTABL Heart: RRR no MRG Abd: NABS, Soft. Nondistended, Tender in right middle quadrant. No hepatosplenomegaly. Rectal: rectal exam performed. No palpable stool in the fault to be disimpacted.  Exts: Brisk capillary refill, warm and well perfused.   No results found for this or any previous visit (from the past 72 hour(s)). No results found.  Assessment and Plan: 76 y.o. male with history of CLL, HTN, HLD here for 4 weeks  duration of severe constipation, with high stool burden seen on CT (performed for CLL staging purposes). Patient has tried nearly every OTC option with no success. Most concerning for severe constipation due to high stool burden. No concern for acute obstruction as patient is passing flatus and watery stool.   Will prescribe PEG bowel preparation. If patient does not improve with PEG, he may need admission to the hospital for NG tube, enemas, and bowel regimen.   Patient will return to care for possible admission if he does not improve in next several days.   No orders of the defined types were placed in this encounter.  Meds ordered this encounter  Medications  . polyethylene glycol (GOLYTELY) 236 g solution    Sig: Take 12 oz every 10 mins until half gone. Finish rest in the morning.    Dispense:  4000 mL    Refill:  0   Discussed warning signs or symptoms. Please see discharge instructions. Patient expresses understanding. I spent 25 minutes with this patient, greater than 50% was face-to-face time counseling regarding ddx and treatment plan.

## 2017-07-19 ENCOUNTER — Telehealth: Payer: Self-pay

## 2017-07-19 NOTE — Telephone Encounter (Signed)
Advise only:Patient called stated that he had a CT done on 06/20/2017, and he have not had a bowel movement since. He has tried Doculax,miralax, mineral enemas and Linzess and he has not had any results please advise patient on what he should do. Patient stated that he has seen Dr. Georgina Snell on yesterday and he was given a colonoscopy prep and he has some watery stool since then and its now clear . And was told by Dr. Georgina Snell to go to the ER if not better. Rhonda Cunningham,CMA

## 2017-07-20 NOTE — Telephone Encounter (Signed)
Noted - he was instructed that ER or gastro referral were next steps. Does he need referral to GI?

## 2017-07-20 NOTE — Telephone Encounter (Signed)
Patient advised to go to the ED because he is feeling very uncomfortable.

## 2017-07-21 ENCOUNTER — Encounter: Payer: Self-pay | Admitting: Internal Medicine

## 2017-07-21 ENCOUNTER — Other Ambulatory Visit: Payer: Self-pay

## 2017-07-21 ENCOUNTER — Encounter (HOSPITAL_COMMUNITY): Payer: Self-pay | Admitting: Nurse Practitioner

## 2017-07-21 ENCOUNTER — Emergency Department (HOSPITAL_COMMUNITY): Payer: Medicare HMO

## 2017-07-21 ENCOUNTER — Emergency Department (HOSPITAL_COMMUNITY)
Admission: EM | Admit: 2017-07-21 | Discharge: 2017-07-21 | Disposition: A | Discharge status: Home / Self Care | Payer: Medicare HMO | Attending: Emergency Medicine | Admitting: Emergency Medicine

## 2017-07-21 DIAGNOSIS — Z79899 Other long term (current) drug therapy: Secondary | ICD-10-CM | POA: Diagnosis not present

## 2017-07-21 DIAGNOSIS — I10 Essential (primary) hypertension: Secondary | ICD-10-CM | POA: Diagnosis not present

## 2017-07-21 DIAGNOSIS — K5900 Constipation, unspecified: Secondary | ICD-10-CM | POA: Diagnosis not present

## 2017-07-21 DIAGNOSIS — Z7984 Long term (current) use of oral hypoglycemic drugs: Secondary | ICD-10-CM | POA: Diagnosis not present

## 2017-07-21 DIAGNOSIS — K5901 Slow transit constipation: Secondary | ICD-10-CM | POA: Insufficient documentation

## 2017-07-21 DIAGNOSIS — Z87891 Personal history of nicotine dependence: Secondary | ICD-10-CM | POA: Diagnosis not present

## 2017-07-21 DIAGNOSIS — E119 Type 2 diabetes mellitus without complications: Secondary | ICD-10-CM | POA: Insufficient documentation

## 2017-07-21 DIAGNOSIS — R109 Unspecified abdominal pain: Secondary | ICD-10-CM | POA: Diagnosis not present

## 2017-07-21 LAB — URINALYSIS, ROUTINE W REFLEX MICROSCOPIC
Bilirubin Urine: NEGATIVE
Glucose, UA: NEGATIVE mg/dL
HGB URINE DIPSTICK: NEGATIVE
Ketones, ur: NEGATIVE mg/dL
LEUKOCYTES UA: NEGATIVE
Nitrite: NEGATIVE
PH: 6 (ref 5.0–8.0)
Protein, ur: NEGATIVE mg/dL
SPECIFIC GRAVITY, URINE: 1.006 (ref 1.005–1.030)

## 2017-07-21 LAB — CBC WITH DIFFERENTIAL/PLATELET
BASOS PCT: 0 %
Basophils Absolute: 0 10*3/uL (ref 0.0–0.1)
EOS PCT: 1 %
Eosinophils Absolute: 0.1 10*3/uL (ref 0.0–0.7)
HEMATOCRIT: 37.8 % — AB (ref 39.0–52.0)
Hemoglobin: 13.2 g/dL (ref 13.0–17.0)
Lymphocytes Relative: 52 %
Lymphs Abs: 6.5 10*3/uL — ABNORMAL HIGH (ref 0.7–4.0)
MCH: 31.1 pg (ref 26.0–34.0)
MCHC: 34.9 g/dL (ref 30.0–36.0)
MCV: 89.2 fL (ref 78.0–100.0)
MONO ABS: 0.8 10*3/uL (ref 0.1–1.0)
MONOS PCT: 6 %
NEUTROS PCT: 41 %
Neutro Abs: 5.2 10*3/uL (ref 1.7–7.7)
Platelets: 162 10*3/uL (ref 150–400)
RBC: 4.24 MIL/uL (ref 4.22–5.81)
RDW: 13.6 % (ref 11.5–15.5)
WBC: 12.6 10*3/uL — AB (ref 4.0–10.5)

## 2017-07-21 LAB — BASIC METABOLIC PANEL
ANION GAP: 8 (ref 5–15)
BUN: 13 mg/dL (ref 6–20)
CHLORIDE: 108 mmol/L (ref 101–111)
CO2: 23 mmol/L (ref 22–32)
Calcium: 9.2 mg/dL (ref 8.9–10.3)
Creatinine, Ser: 0.96 mg/dL (ref 0.61–1.24)
GFR calc Af Amer: >60 mL/min (ref >60)
GFR calc non Af Amer: >60 mL/min (ref >60)
GLUCOSE: 122 mg/dL — AB (ref 65–99)
POTASSIUM: 3.7 mmol/L (ref 3.5–5.1)
Sodium: 139 mmol/L (ref 135–145)

## 2017-07-21 LAB — PATHOLOGIST SMEAR REVIEW

## 2017-07-21 IMAGING — CR DG ABDOMEN ACUTE W/ 1V CHEST
4 series · 4 of 4 positions shown · non-contrast
Comparison: Restaging CT chest abdomen and pelvis [DATE] and
earlier.

CLINICAL DATA: 76-year-old male with mid and lower abdominal pain
and constipation. Chronic lymphocytic leukemia.

EXAM:
DG ABDOMEN ACUTE W/ 1V CHEST

[w chest pa]
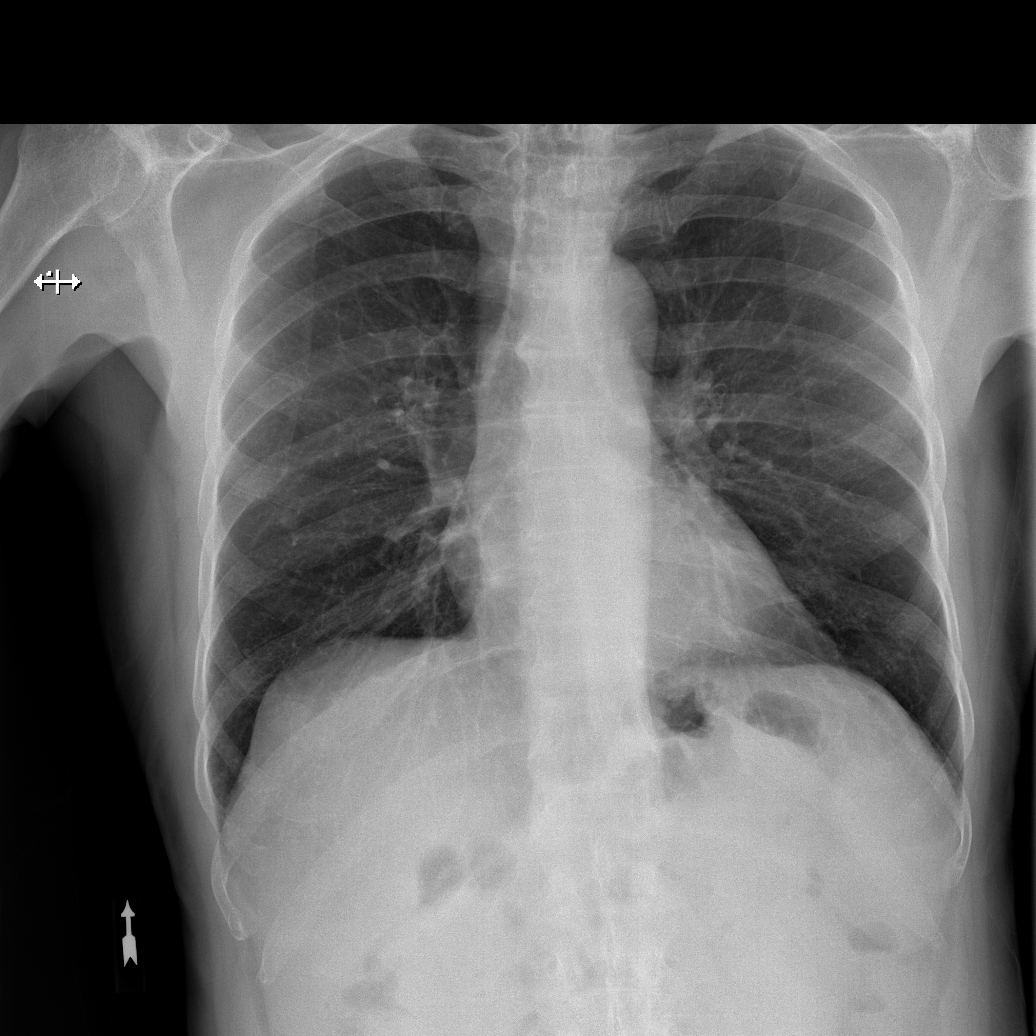

[w abdomen upright]
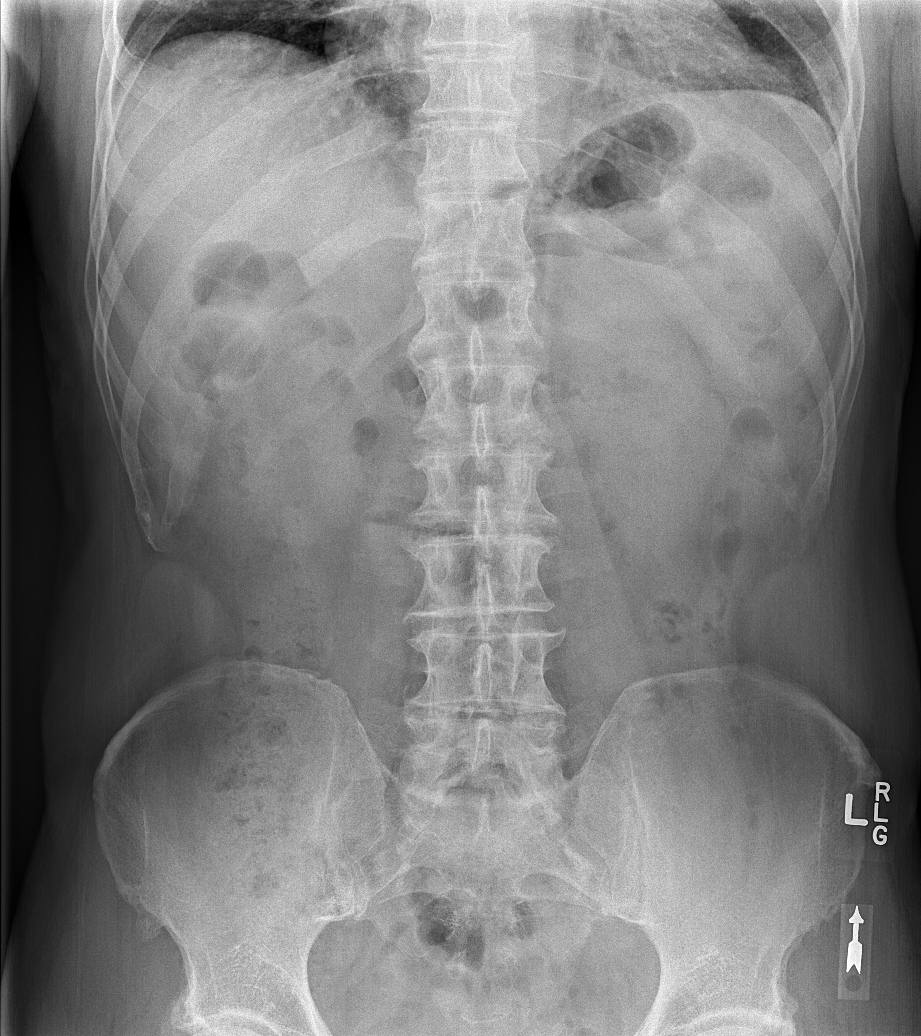

[t abdomen supine (1 of 2)]
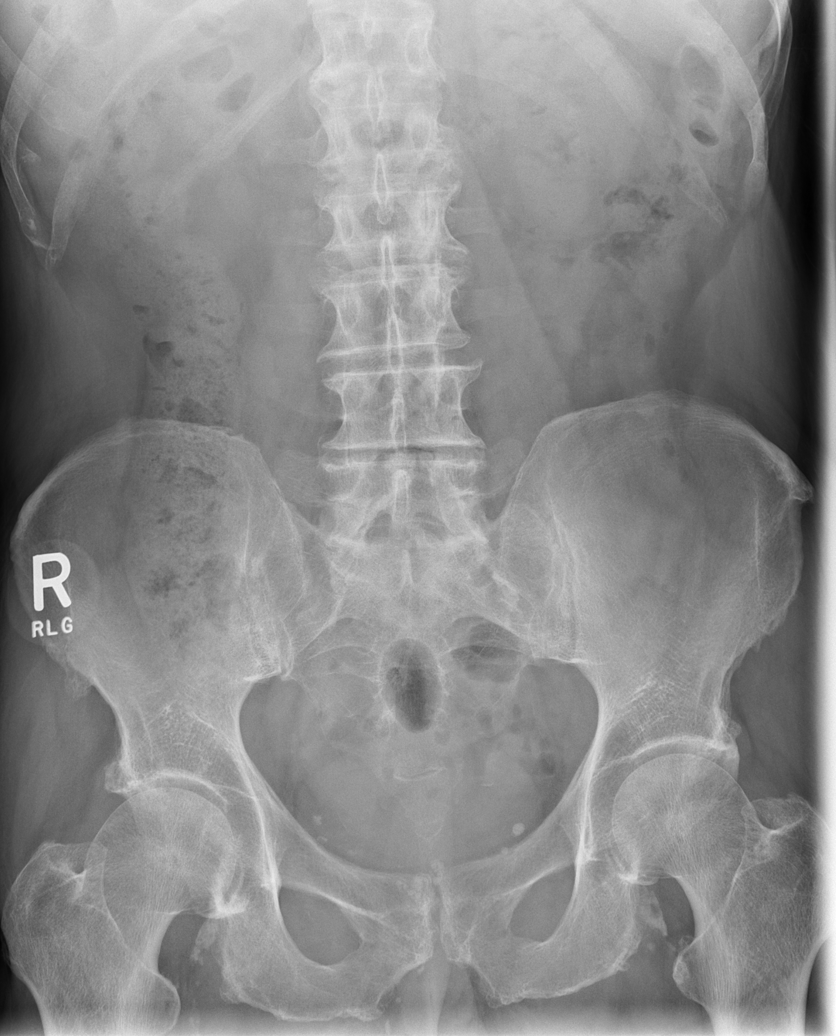

[t abdomen supine (2 of 2)]
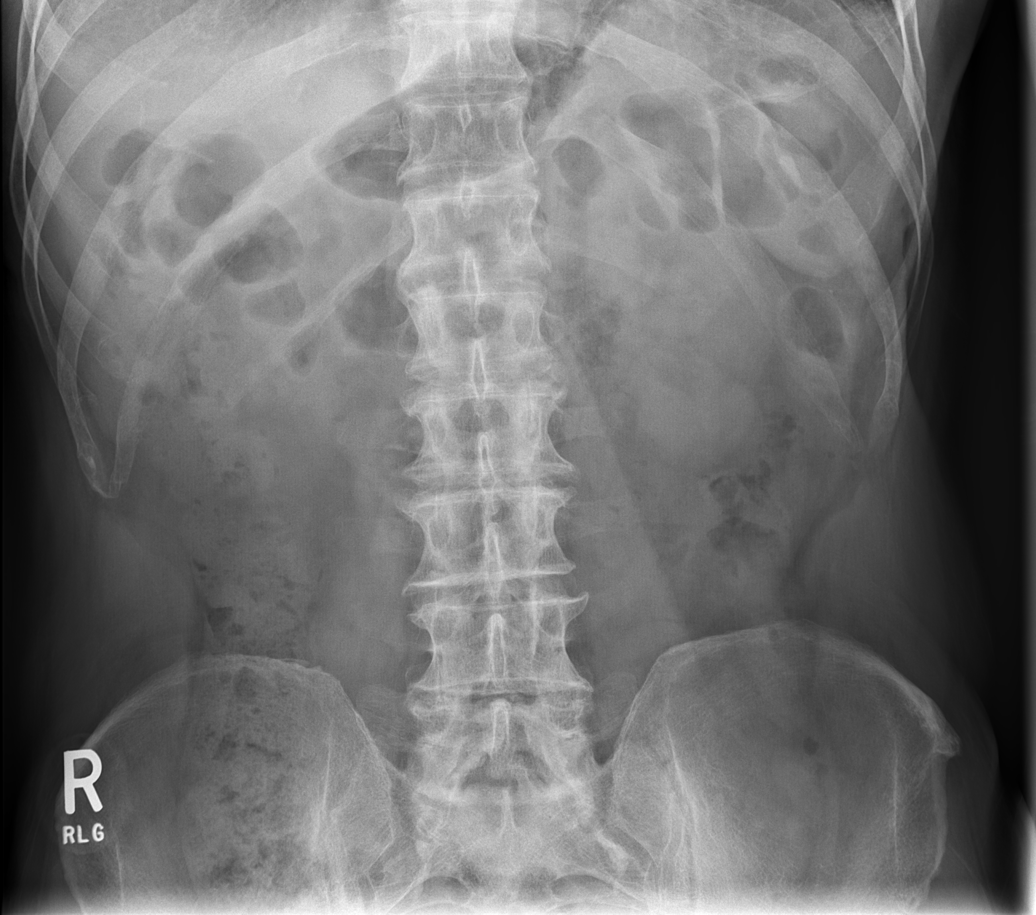

[4 of 4 positions shown; findings below may reference images not displayed]

FINDINGS: Normal cardiac size and mediastinal contours. Lung volumes at the
upper limits of normal. The lungs are clear. No pneumothorax or
pneumoperitoneum. Visualized tracheal air column is within normal
limits.

Non obstructed bowel gas pattern. Mild volume of retained stool
overall, primarily in the right colon.

Abdominal and pelvic visceral contours are within normal limits.
Bilateral pelvic phleboliths again noted.

Aortoiliac calcified atherosclerosis was better demonstrated on the
recent CT. Prominent bilateral femoral artery calcified
atherosclerosis. No acute osseous abnormality identified.
IMPRESSION: 1. Normal bowel gas pattern, no free air. Mild volume of retained
stool, primarily in the right colon.
2. No acute cardiopulmonary abnormality.
3.  Aortic Atherosclerosis ([6W]-[6W]).

## 2017-07-21 IMAGING — CT CT ABD-PELV W/ CM
2 of 5 series · 16 of 46 positions shown, 18 images · IV contrast (ISOVUE)
Comparison: CT abdomen pelvis dated [DATE].

CLINICAL DATA: Abdominal pain for the past month. Recent diagnosis
of leukemia.

EXAM:
CT ABDOMEN AND PELVIS WITH CONTRAST
TECHNIQUE: Multidetector CT imaging of the abdomen and pelvis was performed
using the standard protocol following bolus administration of
intravenous contrast.
CONTRAST:  100mL [6X] IOPAMIDOL ([6X]) INJECTION 61%

[Series 2: abd/pel with · axial · 0.74mm/px · z∈[+1118,+1508]mm · 13 of 91 slices shown, 15 images]
[im 7/91  soft-tissue]
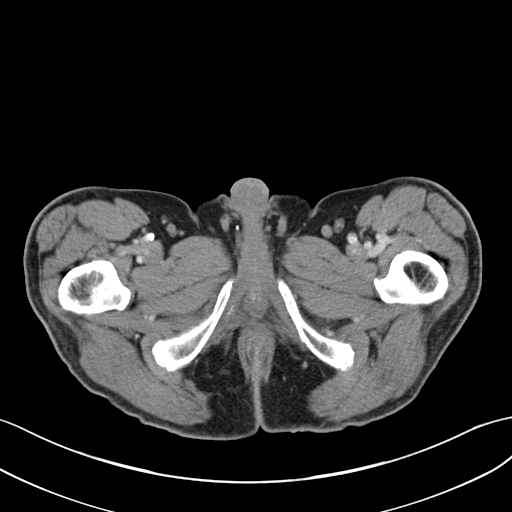
[im 7/91  bone]
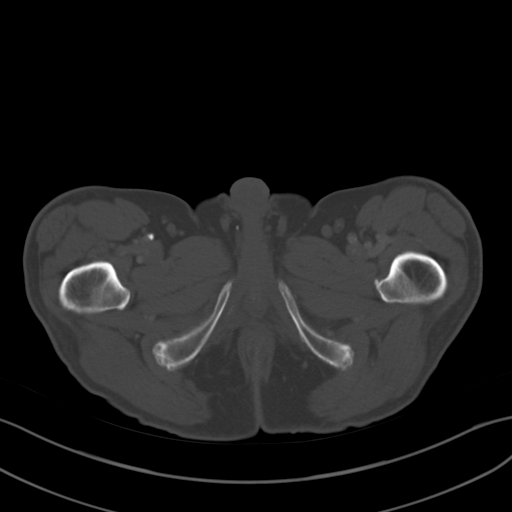
[im 13/91  soft-tissue]
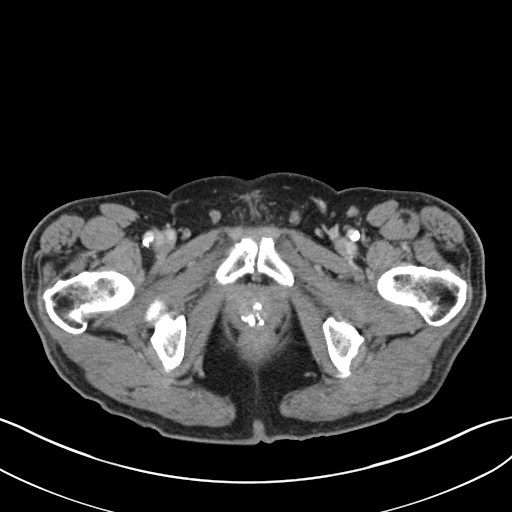
[im 19/91  soft-tissue]
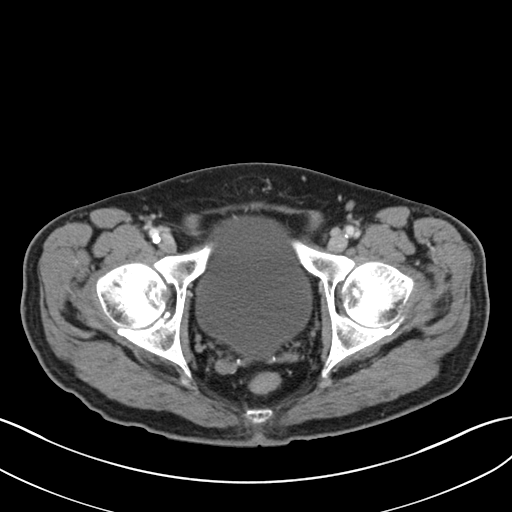
[im 25/91  soft-tissue]
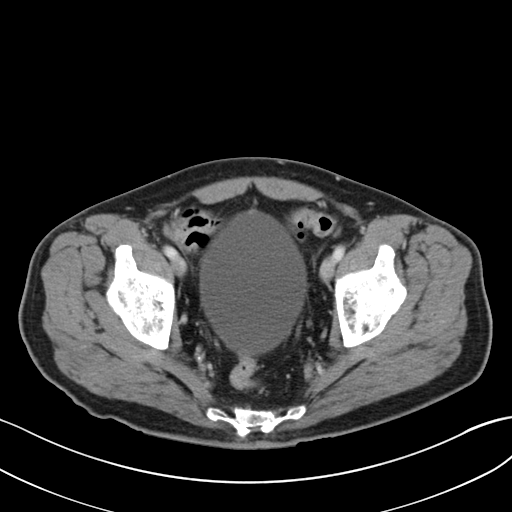
[im 31/91  soft-tissue]
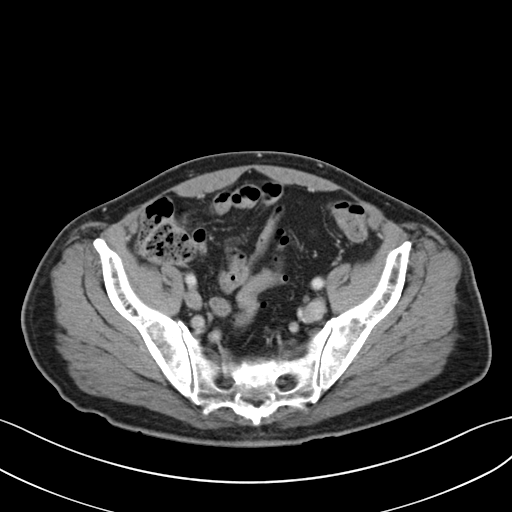
[im 37/91  soft-tissue]
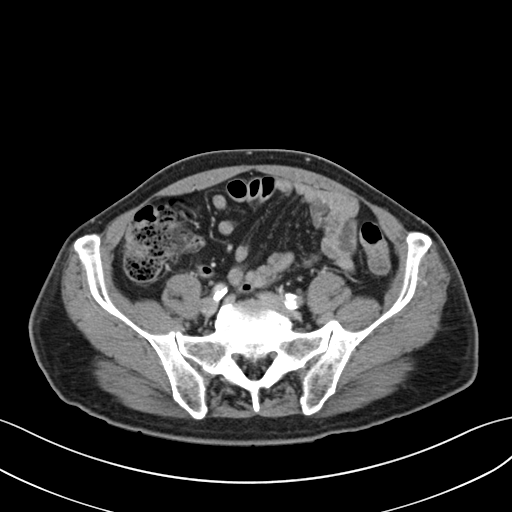
[im 49/91  soft-tissue]
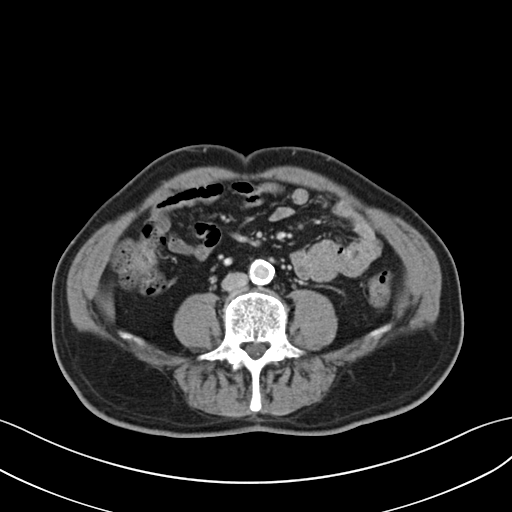
[im 55/91  soft-tissue]
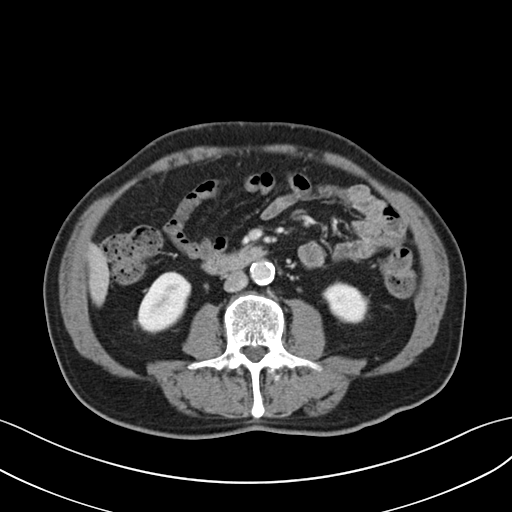
[im 61/91  soft-tissue]
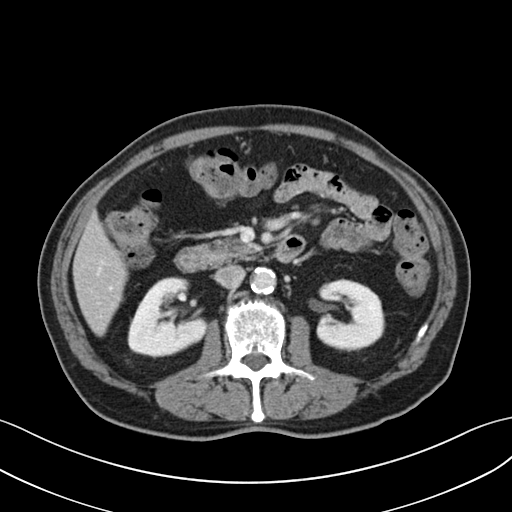
[im 61/91  bone]
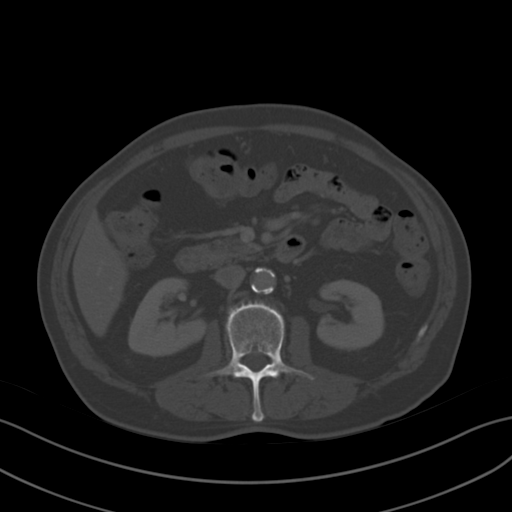
[im 67/91  soft-tissue]
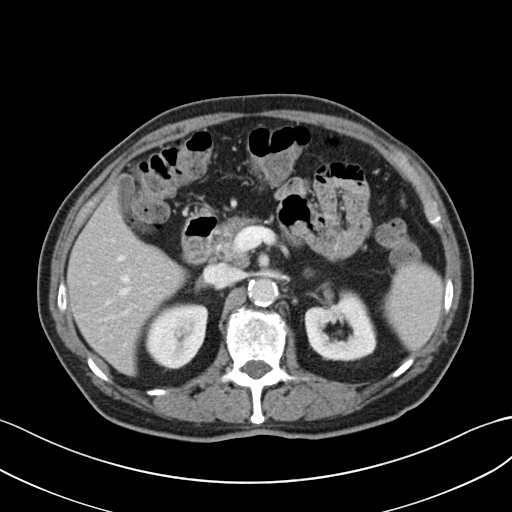
[im 73/91  soft-tissue]
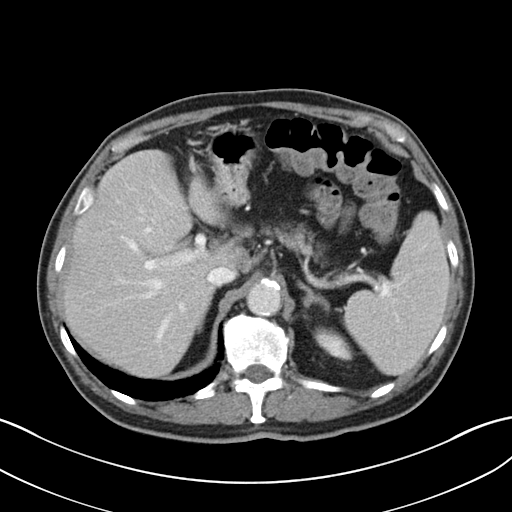
[im 79/91  soft-tissue]
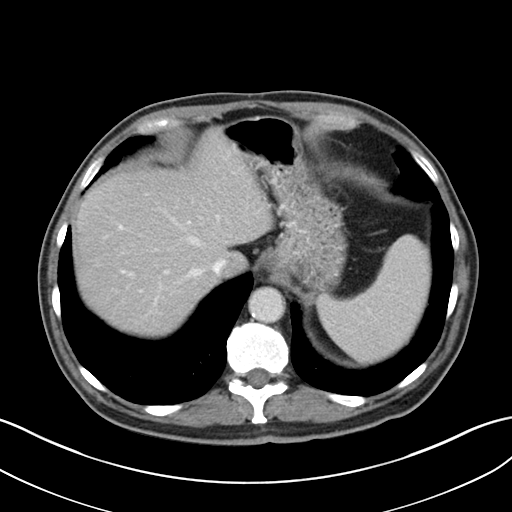
[im 85/91  soft-tissue]
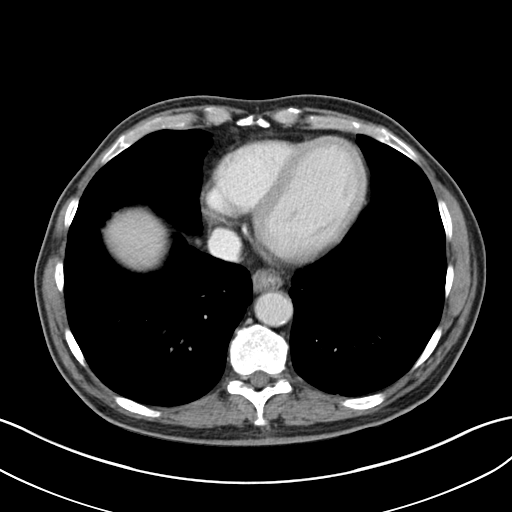

[Series 3: coronal a/|p · coronal · 0.81mm/px · 3 of 121 slices shown]
[im 41/121  soft-tissue]
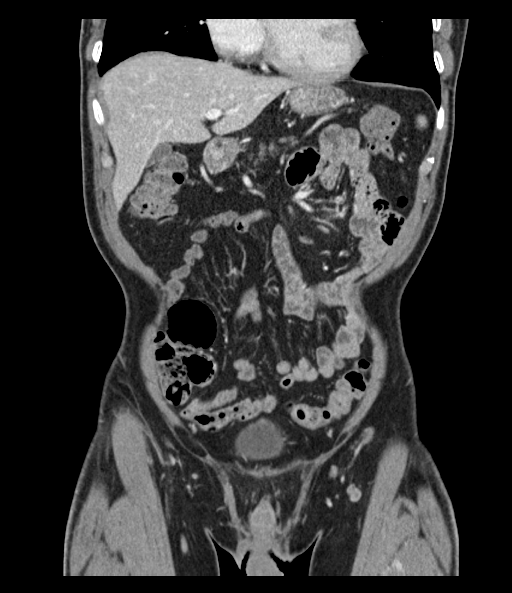
[im 54/121  soft-tissue]
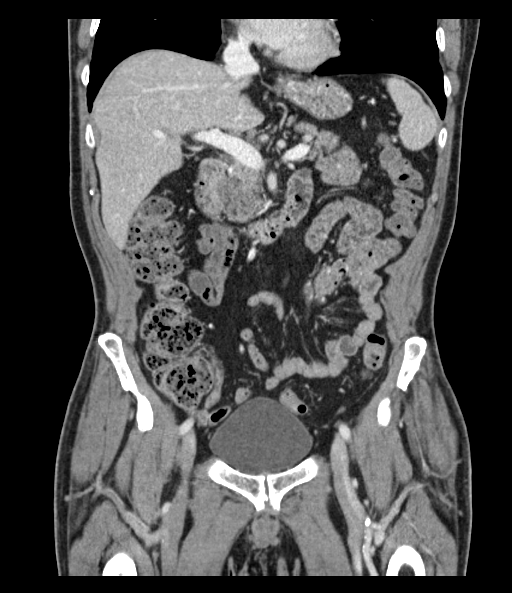
[im 67/121  soft-tissue]
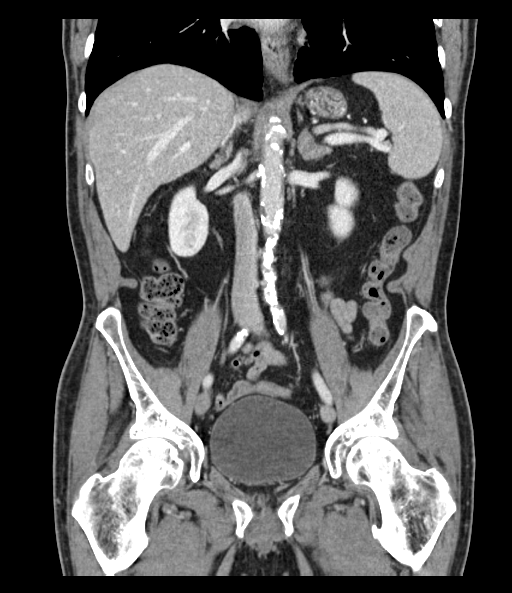

[16 of 46 positions shown; findings below may reference images not displayed]

FINDINGS: Lower chest: No acute abnormality. Unchanged 6 mm nodule in the
right lower lobe, stable since [6X].

Hepatobiliary: No focal liver abnormality is seen. No gallstones,
gallbladder wall thickening, or biliary dilatation.

Pancreas: Unremarkable. No pancreatic ductal dilatation or
surrounding inflammatory changes.

Spleen: Normal in size without focal abnormality.

Adrenals/Urinary Tract: The adrenal glands are unremarkable. Tiny
subcentimeter low-density lesions in the upper pole of the left
kidney remain too small to characterize, but are unchanged. No
hydronephrosis or renal calculi. The bladder is unremarkable.

Stomach/Bowel: Small hiatal hernia. Stomach is otherwise within
normal limits. Appendix appears normal. No evidence of bowel wall
thickening, distention, or inflammatory changes. Moderate stool
burden in the right colon. Scattered sigmoid diverticulosis.

Vascular/Lymphatic: Extensive aortic atherosclerosis. Unchanged
mildly enlarged porta hepatis lymph nodes, with a portacaval lymph
node measuring up to 14 mm in short axis dimension. Index left
inguinal lymph node is unchanged in size, measuring 8 mm in short
axis dimension.

Reproductive: Mild prostate enlargement with central calcifications.

Other: No free fluid or pneumoperitoneum.

Musculoskeletal: No acute or significant osseous findings.
IMPRESSION: 1. No acute intra-abdominal process. Moderate stool burden in the
right colon.
2. Stable mildly enlarged porta hepatis lymph nodes, measuring up to
14 mm in short axis. These remain nonspecific in etiology.
3.  Aortic atherosclerosis ([6X]-[6X]).

## 2017-07-21 MED ORDER — IOPAMIDOL (ISOVUE-300) INJECTION 61%
100.0000 mL | Freq: Once | INTRAVENOUS | Status: AC | PRN
  Administered 2017-07-21: 100 mL via INTRAVENOUS

## 2017-07-21 MED ORDER — IOPAMIDOL (ISOVUE-300) INJECTION 61%
INTRAVENOUS | Status: AC
  Administered 2017-07-21: 10:00:00
  Filled 2017-07-21: qty 100

## 2017-07-21 MED ORDER — POLYETHYLENE GLYCOL 3350 17 G PO PACK: 17.0000 g | PACK | Freq: Two times a day (BID) | ORAL | 0 refills | Status: DC

## 2017-07-21 MED ORDER — MAGNESIUM CITRATE PO SOLN
1.0000 | Freq: Every day | ORAL | 1 refills | Status: DC
Start: 2017-07-21 — End: 2017-10-27

## 2017-07-21 NOTE — Discharge Instructions (Signed)
Take the medications as prescribed, follow-up with your primary care doctor and the GI doctor

## 2017-07-21 NOTE — ED Triage Notes (Signed)
Patient has not had a BM in 4 weeks.

## 2017-07-21 NOTE — ED Provider Notes (Signed)
[facility] DEPT Provider Note   CSN: [id number] Arrival date & time: 07/21/17  0350     History   Chief Complaint No chief complaint on file.   HPI Tony Banks is a 76 y.o. male.  HPI Pt has been having trouble with constipation for the last month.  Patient states he has not had a regular bowel movement in all over that time.  He occasionally has a small stool size of a reason.  He also complains of abdominal tenderness in the right middle and lower quadrant of the abdomen.  He has tried stool softeners, linzess, fleets enemas.  Patient has had trouble with constipation in the past and previously used Linzess.  He was not getting any relief this time.  Patient states he occasionally has some loose watery stools but no formed stools.  Patient had a CT scan of his abdomen and pelvis on October 16.  It was part of his leukemia screening.  It showed moderate stool burden.  he went to see his primary doctor on the 13th.  Patient was instructed to take GoLYTELY solution.  Patient initially stated he did not pass any stool with the GoLYTELY but when I asked him further he clarified that he was passing liquid stools but he did not notice any fecal material.  Feels like just the liquid that he was drinking passed through.  He feels like his symptoms are getting worse and he cannot take not having a bowel movement again.  He was instructed to come to the emergency room.  He is vomiting.  He is able to eat.  Prior records reviewed.  Patient did have a normal colonoscopy in 2015. Past Medical History:  Diagnosis Date  . Constipation    severe  . Diabetes mellitus without complication ([place])   . Nephrolithiasis     Patient Active Problem List   Diagnosis Date Noted  . [place]azion left upper eyelid 08/18/2016  . Type 2 diabetes mellitus ([place]) 07/13/2015  . Hyperlipidemia 04/07/2015  . Hearing loss 04/06/2015  . Essential hypertension 10/27/2014  . Abdominal pain  04/26/2014  . Constipation 04/26/2014  . CLL (chronic lymphocytic leukemia) ([place]) 04/26/2014    Past Surgical History:  Procedure Laterality Date  . FRACTURE SURGERY    . SHOULDER ARTHROSCOPY     bilat  . VASECTOMY         Home Medications    Prior to Admission medications   Medication Sig Start Date End Date Taking? Authorizing Provider  atorvastatin (LIPITOR) 10 MG tablet Take 1 tablet (10 mg total) by mouth daily. 10/27/16  Yes Emeterio Reeve, DO  bisacodyl (BISACODYL LAXATIVE) 10 MG suppository Place 10 mg daily as needed rectally for moderate constipation or severe constipation.   Yes [provider]  Bisacodyl (DULCOLAX PO) Take 3 tablets daily as needed by mouth (constipation).   Yes [provider]  linaclotide (LINZESS) 145 MCG CAPS capsule Take 1 capsule (145 mcg total) daily before breakfast by mouth. 07/11/17  Yes Emeterio Reeve, DO  metFORMIN (GLUCOPHAGE) 500 MG tablet Take 1 tablet (500 mg total) by mouth daily with breakfast. 07/26/16  Yes Emeterio Reeve, DO  mineral oil enema Place 1 enema daily as needed rectally for severe constipation.   Yes [provider]  Multiple Vitamin (MULTIVITAMIN WITH MINERALS) TABS tablet Take 1 tablet daily by mouth.   Yes [provider]  Omega-3 Fatty Acids (FISH OIL PO) Take by mouth.   Yes [provider]  polyethylene glycol (GOLYTELY) 236 g solution Take 12 oz every 10 mins until half gone. Finish rest in the morning. 07/18/17  Yes Gregor Hams, MD  ACCU-CHEK SOFTCLIX LANCETS lancets Use as instructed 10/26/15   Marcial Pacas, DO  AMBULATORY NON FORMULARY MEDICATION Accu-chek aviva plus test strips and accu-chek softclix lancets, use to check blood sugar up to four times a day. Dx Type 2 diabetes. 08/20/14   Hommel, Hilliard Clark, DO  glucose blood (ACCU-CHEK AVIVA PLUS) test strip USE TO CHECK BLOOD SUGAR UP TO 4 TIMES A DAY 09/19/16   Emeterio Reeve, DO  magnesium citrate SOLN Take  296 mLs (1 Bottle total) daily by mouth. 07/21/17   [name], MD  polyethylene glycol [facility]) packet Take 17 g 2 (two) times daily by mouth. 07/21/17   [name], MD    Family History Family History  Problem Relation Age of Onset  . Heart disease Mother   . Stroke Mother   . Cancer Father   . Stroke Maternal Grandfather   . Colon cancer Neg Hx   . Pancreatic cancer Neg Hx   . Rectal cancer Neg Hx   . Stomach cancer Neg Hx     Social History Social History   Tobacco Use  . Smoking status: Former Research scientist (life sciences)  . Smokeless tobacco: Never Used  . Tobacco comment: quit 30 yrs ago  Substance Use Topics  . Alcohol use: No  . Drug use: No     Allergies   Shellfish allergy   Review of Systems Review of Systems  All other systems reviewed and are negative.    Physical Exam Updated Vital Signs BP 137/79   Pulse 100   Temp 98 F (36.7 C)   Resp 14   Ht 1.727 m (5\' 8" )   Wt 66.7 kg (147 lb)   SpO2 99%   BMI 22.35 kg/m   Physical Exam  Constitutional: He appears well-developed and well-nourished. No distress.  HENT:  Head: Normocephalic and atraumatic.  Right Ear: External ear normal.  Left Ear: External ear normal.  Eyes: Conjunctivae are normal. Right eye exhibits no discharge. Left eye exhibits no discharge. No scleral icterus.  Neck: Neck supple. No tracheal deviation present.  Cardiovascular: Normal rate, regular rhythm and intact distal pulses.  Pulmonary/Chest: Effort normal and breath sounds normal. No stridor. No respiratory distress. He has no wheezes. He has no rales.  Abdominal: Soft. Bowel sounds are normal. He exhibits no distension. There is tenderness in the right lower quadrant. There is no rebound and no guarding.  Genitourinary:  Genitourinary Comments: Normal rectal exam, no mass, no fecal impaction  Musculoskeletal: He exhibits no edema or tenderness.  Neurological: He is alert. He has normal strength. No cranial nerve deficit (no facial droop,  extraocular movements intact, no slurred speech) or sensory deficit. He exhibits normal muscle tone. He displays no seizure activity. Coordination normal.  Skin: Skin is warm and dry. No rash noted.  Psychiatric: He has a normal mood and affect.  Nursing note and vitals reviewed.    ED Treatments / Results  Labs (all labs ordered are listed, but only abnormal results are displayed) Labs Reviewed  CBC WITH DIFFERENTIAL/PLATELET - Abnormal; Notable for the following components:      Result Value   WBC 12.6 (*)    HCT 37.8 (*)    Lymphs Abs 6.5 (*)    All other components within normal limits  BASIC METABOLIC PANEL - Abnormal; Notable for the following  components:   Glucose, Bld 122 (*)    All other components within normal limits  URINALYSIS, ROUTINE W REFLEX MICROSCOPIC  PATHOLOGIST SMEAR REVIEW    EKG  EKG Interpretation None       Radiology Ct Abdomen Pelvis W Contrast  Result Date: 07/21/2017 CLINICAL DATA:  Abdominal pain for the past month. Recent diagnosis of leukemia. EXAM: CT ABDOMEN AND PELVIS WITH CONTRAST TECHNIQUE: Multidetector CT imaging of the abdomen and pelvis was performed using the standard protocol following bolus administration of intravenous contrast. CONTRAST:  161mL ISOVUE-300 IOPAMIDOL (ISOVUE-300) INJECTION 61% COMPARISON:  CT abdomen pelvis dated June 20, 2017. FINDINGS: Lower chest: No acute abnormality. Unchanged 6 mm nodule in the right lower lobe, stable since 2015. Hepatobiliary: No focal liver abnormality is seen. No gallstones, gallbladder wall thickening, or biliary dilatation. Pancreas: Unremarkable. No pancreatic ductal dilatation or surrounding inflammatory changes. Spleen: Normal in size without focal abnormality. Adrenals/Urinary Tract: The adrenal glands are unremarkable. Tiny subcentimeter low-density lesions in the upper pole of the left kidney remain too small to characterize, but are unchanged. No hydronephrosis or renal calculi. The  bladder is unremarkable. Stomach/Bowel: Small hiatal hernia. Stomach is otherwise within normal limits. Appendix appears normal. No evidence of bowel wall thickening, distention, or inflammatory changes. Moderate stool burden in the right colon. Scattered sigmoid diverticulosis. Vascular/Lymphatic: Extensive aortic atherosclerosis. Unchanged mildly enlarged porta hepatis lymph nodes, with a portacaval lymph node measuring up to 14 mm in short axis dimension. Index left inguinal lymph node is unchanged in size, measuring 8 mm in short axis dimension. Reproductive: Mild prostate enlargement with central calcifications. Other: No free fluid or pneumoperitoneum. Musculoskeletal: No acute or significant osseous findings. IMPRESSION: 1. No acute intra-abdominal process. Moderate stool burden in the right colon. 2. Stable mildly enlarged porta hepatis lymph nodes, measuring up to 14 mm in short axis. These remain nonspecific in etiology. 3.  Aortic atherosclerosis (ICD10-I70.0). Electronically Signed   By: Titus Dubin M.D.   On: 07/21/2017 10:39   Dg Abdomen Acute W/chest  Result Date: 07/21/2017 CLINICAL DATA:  76 year old male with mid and lower abdominal pain and constipation. Chronic lymphocytic leukemia. EXAM: DG ABDOMEN ACUTE W/ 1V CHEST COMPARISON:  Restaging CT chest abdomen and pelvis 06/20/2017 and earlier. FINDINGS: Normal cardiac size and mediastinal contours. Lung volumes at the upper limits of normal. The lungs are clear. No pneumothorax or pneumoperitoneum. Visualized tracheal air column is within normal limits. Non obstructed bowel gas pattern. Mild volume of retained stool overall, primarily in the right colon. Abdominal and pelvic visceral contours are within normal limits. Bilateral pelvic phleboliths again noted. Aortoiliac calcified atherosclerosis was better demonstrated on the recent CT. Prominent bilateral femoral artery calcified atherosclerosis. No acute osseous abnormality identified.  IMPRESSION: 1. Normal bowel gas pattern, no free air. Mild volume of retained stool, primarily in the right colon. 2. No acute cardiopulmonary abnormality. 3.  Aortic Atherosclerosis (ICD10-I70.0). Electronically Signed   By: Genevie Ann M.D.   On: 07/21/2017 08:11    Procedures Procedures (including critical care time)  Medications Ordered in ED Medications  iopamidol (ISOVUE-300) 61 % injection (  Contrast Given 07/21/17 0958)  iopamidol (ISOVUE-300) 61 % injection 100 mL (100 mLs Intravenous Contrast Given 07/21/17 1014)     Initial Impression / Assessment and Plan / ED Course  I have reviewed the triage vital signs and the nursing notes.  Pertinent labs & imaging results that were available during my care of the patient were reviewed by me and considered  in my medical decision making (see chart for details).  Clinical Course as of Jul 21 1312  Fri Jul 21, 2017  [id number] Persistent abdominal pain with leukocytosis.   Not clearly constipation.  Concerned about possible diverticulitis.  Will ct to evaluate further  [JK]    Clinical Course User Index [JK] [name], MD    Pt's ed workup ultimately negative for acute ostructive or infectious process.  Moderate constipation still noted.  Discussed case with Janett Billow, GI.  Will start on mag citrate and miralax.  Pt will follow up with gastroenterology as an outpatient. Final Clinical Impressions(s) / ED Diagnoses   Final diagnoses:  Slow transit constipation    ED Discharge Orders        Ordered    magnesium citrate SOLN  Daily     07/21/17 1308    polyethylene glycol (MIRALAX) packet  2 times daily     07/21/17 1308       [name], MD 07/21/17 1313

## 2017-07-25 ENCOUNTER — Encounter: Payer: Self-pay | Admitting: *Deleted

## 2017-07-31 ENCOUNTER — Encounter (INDEPENDENT_AMBULATORY_CARE_PROVIDER_SITE_OTHER): Payer: Self-pay

## 2017-07-31 ENCOUNTER — Ambulatory Visit: Payer: Medicare HMO | Admitting: Internal Medicine

## 2017-07-31 ENCOUNTER — Encounter: Payer: Self-pay | Admitting: Internal Medicine

## 2017-07-31 VITALS — BP 134/64 | HR 84 | Ht 67.5 in | Wt 150.0 lb

## 2017-07-31 DIAGNOSIS — C9110 Chronic lymphocytic leukemia of B-cell type not having achieved remission: Secondary | ICD-10-CM

## 2017-07-31 DIAGNOSIS — K5900 Constipation, unspecified: Secondary | ICD-10-CM | POA: Diagnosis not present

## 2017-07-31 DIAGNOSIS — C9190 Lymphoid leukemia, unspecified not having achieved remission: Secondary | ICD-10-CM

## 2017-07-31 MED ORDER — LUBIPROSTONE 24 MCG PO CAPS: 24.0000 ug | ORAL_CAPSULE | Freq: Two times a day (BID) | ORAL | 5 refills | Status: DC

## 2017-07-31 NOTE — Patient Instructions (Signed)
We have sent the following medications to your pharmacy for you to pick up at your convenience: Amitiza 24 mcg take one capsule by mouth twice daily. We gave you samples in the office today to start.   You can continue your Miralax 17 grams in 8 oz of water twice daily but you can decrease if the Amitiza is working.   You can also take Magnesium Citrate as needed.   Call back in 7- 10 days with an update on your symptoms.   Follow up with Dr. Norman Herrlich in 3 months.

## 2017-07-31 NOTE — Progress Notes (Signed)
Patient ID: Tony Banks, male   DOB: May 20, 1941, 76 y.o.   MRN: [id number] HPI: Geroge Banks is a 76 year old male with a past medical history of colonic diverticulosis, new diagnosis of CLL, diabetes, hypertension and hyperlipidemia who was seen in consultation at the request of Dr. Sheppard Coil to evaluate constipation.  He is here alone today.  He reports that he has a history of what he would consider fairly slow GI tract.  His normal bowel movements occur 2 days/week and were often fairly small.  However since October 2018 he has had worsening constipation.  Stools became much less frequent and also smaller and harder.  He was having a bowel movement as infrequently as every 1-2 weeks.  In between bowel movements he would have abdominal gas and have increased belching and flatulence.  He also developed mid and lower abdominal crampy discomfort which was often relieved by bowel movement.  He did not have blood in his stool or melena.  No fevers or chills.  No nausea or vomiting.  He has been seen in the ER for his constipation and had 2 CT scans in the past several months.  CT scans did not show overt GI pathology but did suggest increased stool burden particularly in the right colon.  There was diverticulosis in the colon without diverticulitis.  He was treated with Linzess 145 mcg daily which he was using "most days" but this medication was very expensive for him.  He was also using MiraLAX twice daily and has used magnesium citrate 4 times in the last several months when he became most severely constipated.  A combination of Linzess and MiraLAX has definitively helped and he is having bowel movements 3-4 days/week.  His abdominal pain has resolved with more frequent bowel movements as has his gas and bloating.  The Linzess was significantly expensive for him and he would like another option if possible.  He had a colonoscopy in 2015, September by Dr. Deatra Ina.  This showed moderate sigmoid diverticulosis but  was otherwise normal.  The prep was excellent and the exam was to the cecum.  No known family history of GI tract malignancy.  His father had cancer of unknown primary but actually died of a heart attack.  He denies any new medications.  From a CLL perspective he is being observed for now and will follow up with Dr. Lebron Conners in January  He had rectal exam by primary care and in the ER in the last 2 months both of which were reported as normal.  Past Medical History:  Diagnosis Date  . Aortic atherosclerosis ([place])   . CLL (chronic lymphocytic leukemia) ([place])   . Constipation    severe  . Diabetes mellitus without complication ([place])   . Diverticulosis   . HLD (hyperlipidemia)   . Hypertension   . Nephrolithiasis     Past Surgical History:  Procedure Laterality Date  . FINGER FRACTURE SURGERY Right    middle finger  . SHOULDER ARTHROSCOPY Bilateral   . VASECTOMY      Outpatient Medications Prior to Visit  Medication Sig Dispense Refill  . ACCU-CHEK SOFTCLIX LANCETS lancets Use as instructed 100 each 12  . AMBULATORY NON FORMULARY MEDICATION Accu-chek aviva plus test strips and accu-chek softclix lancets, use to check blood sugar up to four times a day. Dx Type 2 diabetes. 100 Units 3  . atorvastatin (LIPITOR) 10 MG tablet Take 1 tablet (10 mg total) by mouth daily. 90 tablet 3  . glucose  blood (ACCU-CHEK AVIVA PLUS) test strip USE TO CHECK BLOOD SUGAR UP TO 4 TIMES A DAY 100 each 12  . linaclotide (LINZESS) 145 MCG CAPS capsule Take 1 capsule (145 mcg total) daily before breakfast by mouth. (Patient taking differently: Take 145 mcg by mouth as needed. ) 30 capsule 2  . magnesium citrate SOLN Take 296 mLs (1 Bottle total) daily by mouth. (Patient taking differently: Take 1 Bottle by mouth as needed. ) 2 Bottle 1  . Multiple Vitamin (MULTIVITAMIN WITH MINERALS) TABS tablet Take 1 tablet daily by mouth.    . Omega-3 Fatty Acids (FISH OIL PO) Take by mouth.    . polyethylene glycol  (MIRALAX) packet Take 17 g 2 (two) times daily by mouth. (Patient taking differently: Take 17 g by mouth as needed. ) 28 each 0  . metFORMIN (GLUCOPHAGE) 500 MG tablet Take 1 tablet (500 mg total) by mouth daily with breakfast. (Patient not taking: Reported on 07/31/2017) 180 tablet 3  . bisacodyl (BISACODYL LAXATIVE) 10 MG suppository Place 10 mg daily as needed rectally for moderate constipation or severe constipation.    . Bisacodyl (DULCOLAX PO) Take 3 tablets daily as needed by mouth (constipation).    . mineral oil enema Place 1 enema daily as needed rectally for severe constipation.    . polyethylene glycol (GOLYTELY) 236 g solution Take 12 oz every 10 mins until half gone. Finish rest in the morning. 4000 mL 0   No facility-administered medications prior to visit.     Allergies  Allergen Reactions  . Shellfish Allergy Other (See Comments)    Leg swelling     Family History  Problem Relation Age of Onset  . Heart disease Mother   . Stroke Mother   . Cancer Father   . Stroke Maternal Grandfather   . Colon cancer Neg Hx   . Pancreatic cancer Neg Hx   . Rectal cancer Neg Hx   . Stomach cancer Neg Hx     Social History   Tobacco Use  . Smoking status: Former Smoker    Last attempt to quit: 07/31/1977    Years since quitting: 40.0  . Smokeless tobacco: Never Used  . Tobacco comment: quit 30 yrs ago  Substance Use Topics  . Alcohol use: No  . Drug use: No    ROS: As per history of present illness, otherwise negative  BP 134/64 (BP Location: Left Arm, Patient Position: Sitting, Cuff Size: Normal)   Pulse 84 Comment: irregular  Ht 5' 7.5" (1.715 m) Comment: height measured without shoes  Wt 150 lb (68 kg)   BMI 23.15 kg/m  Constitutional: Well-developed and well-nourished. No distress. HEENT: Normocephalic and atraumatic. Oropharynx is clear and moist. Conjunctivae are normal.  No scleral icterus. Neck: Neck supple. Trachea midline. Cardiovascular: Normal rate,  regular rhythm and intact distal pulses. No M/R/G Pulmonary/chest: Effort normal and breath sounds normal. No wheezing, rales or rhonchi. Abdominal: Soft, nontender, nondistended. Bowel sounds active throughout. There are no masses palpable. No hepatosplenomegaly. Extremities: no clubbing, cyanosis, or edema Neurological: Alert and oriented to person place and time. Skin: Skin is warm and dry.  Psychiatric: Normal mood and affect. Behavior is normal.  RELEVANT LABS AND IMAGING: CBC    Component Value Date/Time   WBC 12.6 (H) 07/21/2017 0750   RBC 4.24 07/21/2017 0750   HGB 13.2 07/21/2017 0750   HGB 12.8 (L) 05/29/2017 1141   HCT 37.8 (L) 07/21/2017 0750   HCT 38.7 05/29/2017 1141  PLT 162 07/21/2017 0750   PLT 151 05/29/2017 1141   MCV 89.2 07/21/2017 0750   MCV 91.3 05/29/2017 1141   MCH 31.1 07/21/2017 0750   MCHC 34.9 07/21/2017 0750   RDW 13.6 07/21/2017 0750   RDW 13.8 05/29/2017 1141   LYMPHSABS 6.5 (H) 07/21/2017 0750   LYMPHSABS 8.8 (H) 05/29/2017 1141   MONOABS 0.8 07/21/2017 0750   MONOABS 0.7 05/29/2017 1141   EOSABS 0.1 07/21/2017 0750   EOSABS 0.1 05/29/2017 1141   BASOSABS 0.0 07/21/2017 0750   BASOSABS 0.0 05/29/2017 1141    CMP     Component Value Date/Time   NA 139 07/21/2017 0750   NA 142 05/29/2017 1141   K 3.7 07/21/2017 0750   K 4.2 05/29/2017 1141   CL 108 07/21/2017 0750   CO2 23 07/21/2017 0750   CO2 26 05/29/2017 1141   GLUCOSE 122 (H) 07/21/2017 0750   GLUCOSE 106 05/29/2017 1141   BUN 13 07/21/2017 0750   BUN 12.0 05/29/2017 1141   CREATININE 0.96 07/21/2017 0750   CREATININE 0.9 05/29/2017 1141   CALCIUM 9.2 07/21/2017 0750   CALCIUM 9.4 05/29/2017 1141   PROT 7.3 05/29/2017 1141   ALBUMIN 4.3 05/29/2017 1141   AST 19 05/29/2017 1141   ALT 17 05/29/2017 1141   ALKPHOS 70 05/29/2017 1141   BILITOT 0.99 05/29/2017 1141   GFRNONAA >60 07/21/2017 0750   GFRNONAA 80 04/19/2017 0808   GFRAA >60 07/21/2017 0750   GFRAA >89  04/19/2017 0808   CT ABDOMEN AND PELVIS WITH CONTRAST   TECHNIQUE: Multidetector CT imaging of the abdomen and pelvis was performed using the standard protocol following bolus administration of intravenous contrast.   CONTRAST:  139mL ISOVUE-300 IOPAMIDOL (ISOVUE-300) INJECTION 61%   COMPARISON:  CT abdomen pelvis dated June 20, 2017.   FINDINGS: Lower chest: No acute abnormality. Unchanged 6 mm nodule in the right lower lobe, stable since 2015.   Hepatobiliary: No focal liver abnormality is seen. No gallstones, gallbladder wall thickening, or biliary dilatation.   Pancreas: Unremarkable. No pancreatic ductal dilatation or surrounding inflammatory changes.   Spleen: Normal in size without focal abnormality.   Adrenals/Urinary Tract: The adrenal glands are unremarkable. Tiny subcentimeter low-density lesions in the upper pole of the left kidney remain too small to characterize, but are unchanged. No hydronephrosis or renal calculi. The bladder is unremarkable.   Stomach/Bowel: Small hiatal hernia. Stomach is otherwise within normal limits. Appendix appears normal. No evidence of bowel wall thickening, distention, or inflammatory changes. Moderate stool burden in the right colon. Scattered sigmoid diverticulosis.   Vascular/Lymphatic: Extensive aortic atherosclerosis. Unchanged mildly enlarged porta hepatis lymph nodes, with a portacaval lymph node measuring up to 14 mm in short axis dimension. Index left inguinal lymph node is unchanged in size, measuring 8 mm in short axis dimension.   Reproductive: Mild prostate enlargement with central calcifications.   Other: No free fluid or pneumoperitoneum.   Musculoskeletal: No acute or significant osseous findings.   IMPRESSION: 1. No acute intra-abdominal process. Moderate stool burden in the right colon. 2. Stable mildly enlarged porta hepatis lymph nodes, measuring up to 14 mm in short axis. These remain nonspecific in  etiology. 3.  Aortic atherosclerosis (ICD10-I70.0).     Electronically Signed   By: Titus Dubin M.D.   On: 07/21/2017 10:39    ASSESSMENT/PLAN: 76 year old male with a past medical history of colonic diverticulosis, new diagnosis of CLL, diabetes, hypertension and hyperlipidemia who was seen in consultation at the  request of Dr. Sheppard Coil to evaluate constipation.  1.  Constipation --unclear why this process is worse over the last several months, I cannot specifically associate it with CLL diagnosis.  Linzess is cost prohibitive and so I will try him on Amitiza 24 mcg twice daily with food.  For now he will continue twice daily MiraLAX 17 g but can slowly back off this if Amitiza is effective as a single agent.  He can still use magnesium citrate on an as-needed basis but I would like him to do this very sparingly.  If he is needing it on any regular basis he is asked to notify me and we would need to change therapy.  I would like to see him back in 3 months, sooner if needed for continuity and to see how he has responded to therapy.  2.  Colon cancer screening --up-to-date with last colonoscopy at age 77 in September 2015.  No plan for repeat colonoscopy at this time  3.  CLL --being followed by hematology, not currently on therapy   JX:[id number], [place], [place], [place] [id number]

## 2017-08-08 ENCOUNTER — Telehealth: Payer: Self-pay | Admitting: Internal Medicine

## 2017-08-08 NOTE — Telephone Encounter (Signed)
Patient states that the [place] worked very well for him and he feels much better now. I advised that as per Dr Vena Rua last office note, he can now try to take Miralax 17g twice daily and wean off of Amitiza (since cost prohibitive) to see if this alone works for him. Patient verbalizes understanding and is also advised to make sure he follows up in late February.

## 2017-09-22 ENCOUNTER — Telehealth: Payer: Self-pay | Admitting: Hematology and Oncology

## 2017-09-22 ENCOUNTER — Inpatient Hospital Stay: Payer: Medicare HMO

## 2017-09-22 ENCOUNTER — Inpatient Hospital Stay: Payer: Medicare HMO | Attending: Hematology and Oncology | Admitting: Hematology and Oncology

## 2017-09-22 VITALS — BP 154/68 | HR 81 | Temp 99.0°F | Resp 20 | Wt 148.9 lb

## 2017-09-22 DIAGNOSIS — C9190 Lymphoid leukemia, unspecified not having achieved remission: Secondary | ICD-10-CM | POA: Diagnosis not present

## 2017-09-22 DIAGNOSIS — C9110 Chronic lymphocytic leukemia of B-cell type not having achieved remission: Secondary | ICD-10-CM

## 2017-09-22 DIAGNOSIS — K5900 Constipation, unspecified: Secondary | ICD-10-CM | POA: Diagnosis not present

## 2017-09-22 DIAGNOSIS — Z87891 Personal history of nicotine dependence: Secondary | ICD-10-CM

## 2017-09-22 DIAGNOSIS — Z87442 Personal history of urinary calculi: Secondary | ICD-10-CM | POA: Insufficient documentation

## 2017-09-22 DIAGNOSIS — E119 Type 2 diabetes mellitus without complications: Secondary | ICD-10-CM

## 2017-09-22 DIAGNOSIS — M549 Dorsalgia, unspecified: Secondary | ICD-10-CM | POA: Diagnosis not present

## 2017-09-22 DIAGNOSIS — E785 Hyperlipidemia, unspecified: Secondary | ICD-10-CM

## 2017-09-22 DIAGNOSIS — R200 Anesthesia of skin: Secondary | ICD-10-CM

## 2017-09-22 DIAGNOSIS — Z79899 Other long term (current) drug therapy: Secondary | ICD-10-CM | POA: Diagnosis not present

## 2017-09-22 DIAGNOSIS — Z809 Family history of malignant neoplasm, unspecified: Secondary | ICD-10-CM | POA: Diagnosis not present

## 2017-09-22 DIAGNOSIS — K5790 Diverticulosis of intestine, part unspecified, without perforation or abscess without bleeding: Secondary | ICD-10-CM | POA: Diagnosis not present

## 2017-09-22 DIAGNOSIS — Z7984 Long term (current) use of oral hypoglycemic drugs: Secondary | ICD-10-CM | POA: Insufficient documentation

## 2017-09-22 DIAGNOSIS — I700 Atherosclerosis of aorta: Secondary | ICD-10-CM

## 2017-09-22 DIAGNOSIS — I10 Essential (primary) hypertension: Secondary | ICD-10-CM | POA: Diagnosis not present

## 2017-09-22 LAB — COMPREHENSIVE METABOLIC PANEL
ALK PHOS: 86 U/L (ref 40–150)
ALT: 17 U/L (ref 0–55)
AST: 18 U/L (ref 5–34)
Albumin: 4.4 g/dL (ref 3.5–5.0)
Anion gap: 10 (ref 3–11)
BUN: 20 mg/dL (ref 7–26)
CALCIUM: 10 mg/dL (ref 8.4–10.4)
CO2: 27 mmol/L (ref 22–29)
Chloride: 105 mmol/L (ref 98–109)
Creatinine, Ser: 0.95 mg/dL (ref 0.70–1.30)
GFR calc non Af Amer: >60 mL/min (ref >60)
GLUCOSE: 138 mg/dL (ref 70–140)
Potassium: 4.2 mmol/L (ref 3.5–5.1)
SODIUM: 142 mmol/L (ref 136–145)
Total Bilirubin: 0.8 mg/dL (ref 0.2–1.2)
Total Protein: 7.6 g/dL (ref 6.4–8.3)

## 2017-09-22 LAB — CBC WITH DIFFERENTIAL/PLATELET
Basophils Absolute: 0 10*3/uL (ref 0.0–0.1)
Basophils Relative: 0 %
EOS ABS: 0.2 10*3/uL (ref 0.0–0.5)
Eosinophils Relative: 1 %
HCT: 40.5 % (ref 38.4–49.9)
HEMOGLOBIN: 13.6 g/dL (ref 13.0–17.1)
LYMPHS ABS: 9.5 10*3/uL — AB (ref 0.9–3.3)
LYMPHS PCT: 57 %
MCH: 30.5 pg (ref 27.2–33.4)
MCHC: 33.6 g/dL (ref 32.0–36.0)
MCV: 90.8 fL (ref 79.3–98.0)
Monocytes Absolute: 0.8 10*3/uL (ref 0.1–0.9)
Monocytes Relative: 5 %
Neutro Abs: 6.1 10*3/uL (ref 1.5–6.5)
Neutrophils Relative %: 37 %
Platelets: 180 10*3/uL (ref 140–400)
RBC: 4.46 MIL/uL (ref 4.20–5.82)
RDW: 13.9 % (ref 11.0–15.6)
WBC: 16.6 10*3/uL — ABNORMAL HIGH (ref 4.0–10.3)

## 2017-09-22 LAB — LACTATE DEHYDROGENASE: LDH: 192 U/L (ref 125–245)

## 2017-09-22 NOTE — Telephone Encounter (Signed)
Gave patient avs and calendar with appts per 11/8 los.  °

## 2017-09-23 LAB — IGG, IGA, IGM
IGA: 113 mg/dL (ref 61–437)
IGG (IMMUNOGLOBIN G), SERUM: 1009 mg/dL (ref 700–1600)
IGM (IMMUNOGLOBULIN M), SRM: 77 mg/dL (ref 15–143)

## 2017-10-13 ENCOUNTER — Encounter: Payer: Self-pay | Admitting: Hematology and Oncology

## 2017-10-13 NOTE — Progress Notes (Signed)
Tony Banks Follow-up Visit:  Assessment: CLL (chronic lymphocytic leukemia) ([place]) 77 y.o. male with a confirmed diagnosis of chronic lymphocytic leukemia, now having completed the staging evaluation. Patient is diagnosed with Rai 1 disease based on presence of lymphocytosis and mild lymphadenopathy without splenomegaly, anemia, or thrombocytopenia. Cytogenetics and FISH are negative for presence of high risk indicators, suggesting presence of intermediate prognosis disease.  Patient has no significant symptoms attributable to his hematological condition.  At this time, I do not recommend any disease - direct therapy, prefer to observe patient.  Plan: --Labs prior to RTC --RTC 3 months for progression monitoring --Next disease assessment with imaging at least one year from the last scan or on appearance of new symptoms.  Voice recognition software was used and creation of this note. Despite my best effort at editing the text, some misspelling/errors may have occurred.  Orders Placed This Encounter  Procedures  . CBC with Differential ([facility] Only)    Standing Status:   Future    Standing Expiration Date:   09/22/2018  . CMP ([facility] only)    Standing Status:   Future    Standing Expiration Date:   09/22/2018  . Lactate dehydrogenase (LDH)    Standing Status:   Future    Standing Expiration Date:   09/22/2018  . Uric acid    Standing Status:   Future    Standing Expiration Date:   09/22/2018  . Beta 2 microglobulin    Standing Status:   Future    Standing Expiration Date:   09/22/2018  . QIG  (Quant. immunoglobulins  - IgG, IgA, IgM)    Standing Status:   Future    Standing Expiration Date:   09/22/2018    [facility] Staging CLL (chronic lymphocytic leukemia) ([place]) Staging form: Chronic Lymphocytic Leukemia / Small Lymphocytic Lymphoma, AJCC 8th Edition - Clinical stage from 06/22/2017: Modified Rai Stage I (Modified Rai risk: Intermediate,  Lymphocytosis: Present, Adenopathy: Present, Organomegaly: Absent, Anemia: Absent, Thrombocytopenia: Absent) - Signed by Ardath Sax, MD on 06/28/2017   All questions were answered.  . The patient knows to call the clinic with any problems, questions or concerns.  This note was electronically signed.    History of Presenting Illness Tony Banks is a 77 y.o. followed in the [facility] for Chronic Lymphocytic Leukemia. On review of the labs, leukocytosis has been present since 2015, please see the blood count trends outlined below.   At this time, patient reports no fevers, chills, unexpected weight loss or change in energy level. He does complain off significant night sweats. He occasionally has pain in the posterior thighs and pain in the bones of his legs. The pain usually is short-lived. Complains of chronic back pain. He also complains of numbness in bilateral feet for the past 2 years. Denies any chest pain, shortness of breath, or cough. No, dysphasia, early satiety, abdominal pain, diarrhea, or constipation. No dysuria or hematuria. No focal weakness in the extremities or face.  Recent returns to the clinic today to review our lab work findings.  Patient had a bout of problems with constipation.  All resolved now.  Denies any new complaints since last visit to the clinic.  Oncological/hematological History: --Labs, 04/27/14: WBC   7.5, ALC   ..., Hgb 12.3, Plt 181; --Labs, 04/06/15: WBC 10.7, ALC   ..., Hgb 13.8, Plt 210; --Labs, 04/27/16: WBC 13.5, ALC   ..., Hgb 13.4, Plt 185; --Labs, 04/19/17: WBC 14.7, ALC  8.1, Hgb 13.5, Plt 186; Smear -- atypical lymphocytes present --Labs, 05/29/17: WBC 16.3, ALC 8.8, Hgb 12.8, Plt 151; LDH 165, beta-2 microglobulin 1.6; IgG 911, IgA 128, IgM 83; Flow Cyto -- monoclonal B cells present, positive for CD19, CD20, CD5, CD23 comprising 73% of all lymphocytes; Cytogenetics/FISH -- Normal cytogenetics, FISH negative for del17p, del11q, del13q .   --CT C/A/P, 06/20/17: Mildly enlarged porta hepatis/right upper quadrant lymph nodes. Largest portacaval lymph node has a short axis diameter of 14 mm. No other enlarged lymph nodes in the neck, chest, abdomen or pelvis. Spleen is normal size with a craniocaudal length of 10.4 cm. No focal splenic abnormality. --Labs, 09/22/17: WBC 16.6, ALC 9.5, Hgb 13.6, Plt 180; LDH 192   Medical History: Past Medical History:  Diagnosis Date  . Aortic atherosclerosis ([place])   . CLL (chronic lymphocytic leukemia) ([place])   . Constipation    severe  . Diabetes mellitus without complication ([place])   . Diverticulosis   . HLD (hyperlipidemia)   . Hypertension   . Nephrolithiasis     Surgical History: Past Surgical History:  Procedure Laterality Date  . FINGER FRACTURE SURGERY Right    middle finger  . SHOULDER ARTHROSCOPY Bilateral   . VASECTOMY      Family History: Family History  Problem Relation Age of Onset  . Heart disease Mother   . Stroke Mother   . Banks Father   . Stroke Maternal Grandfather   . Colon Banks Neg Hx   . Pancreatic Banks Neg Hx   . Rectal Banks Neg Hx   . Stomach Banks Neg Hx     Social History: Social History   Socioeconomic History  . Marital status: Married    Spouse name: Not on file  . Number of children: 0  . Years of education: Not on file  . Highest education level: Not on file  Social Needs  . Financial resource strain: Not on file  . Food insecurity - worry: Not on file  . Food insecurity - inability: Not on file  . Transportation needs - medical: Not on file  . Transportation needs - non-medical: Not on file  Occupational History  . Occupation: retired  Tobacco Use  . Smoking status: Former Smoker    Last attempt to quit: 07/31/1977    Years since quitting: 40.2  . Smokeless tobacco: Never Used  . Tobacco comment: quit 30 yrs ago  Substance and Sexual Activity  . Alcohol use: No  . Drug use: No  . Sexual activity: No    Birth  control/protection: Abstinence  Other Topics Concern  . Not on file  Social History Narrative  . Not on file    Allergies: Allergies  Allergen Reactions  . Shellfish Allergy Other (See Comments)    Leg swelling     Medications:  Current Outpatient Medications  Medication Sig Dispense Refill  . ACCU-CHEK SOFTCLIX LANCETS lancets Use as instructed 100 each 12  . AMBULATORY NON FORMULARY MEDICATION Accu-chek aviva plus test strips and accu-chek softclix lancets, use to check blood sugar up to four times a day. Dx Type 2 diabetes. 100 Units 3  . atorvastatin (LIPITOR) 10 MG tablet Take 1 tablet (10 mg total) by mouth daily. 90 tablet 3  . glucose blood (ACCU-CHEK AVIVA PLUS) test strip USE TO CHECK BLOOD SUGAR UP TO 4 TIMES A DAY 100 each 12  . linaclotide (LINZESS) 145 MCG CAPS capsule Take 1 capsule (145 mcg total) daily before breakfast by mouth. (  Patient taking differently: Take 145 mcg by mouth as needed. ) 30 capsule 2  . lubiprostone (AMITIZA) 24 MCG capsule Take 1 capsule (24 mcg total) by mouth 2 (two) times daily with a meal. 60 capsule 5  . magnesium citrate SOLN Take 296 mLs (1 Bottle total) daily by mouth. (Patient taking differently: Take 1 Bottle by mouth as needed. ) 2 Bottle 1  . metFORMIN (GLUCOPHAGE) 500 MG tablet Take 1 tablet (500 mg total) by mouth daily with breakfast. (Patient not taking: Reported on 07/31/2017) 180 tablet 3  . Multiple Vitamin (MULTIVITAMIN WITH MINERALS) TABS tablet Take 1 tablet daily by mouth.    . Omega-3 Fatty Acids (FISH OIL PO) Take by mouth.    . polyethylene glycol (MIRALAX) packet Take 17 g 2 (two) times daily by mouth. (Patient taking differently: Take 17 g by mouth as needed. ) 28 each 0   No current facility-administered medications for this visit.     Review of Systems: Review of Systems  Constitutional: Positive for diaphoresis. Negative for appetite change, chills, fatigue and fever.  HENT:  Negative.   Eyes: Negative.    Respiratory: Negative.   Cardiovascular: Negative.   Gastrointestinal: Positive for constipation.  Endocrine: Negative.   Genitourinary: Negative.    Musculoskeletal: Positive for arthralgias and back pain.  Skin: Negative.   Neurological: Positive for numbness. Negative for extremity weakness.  Hematological: Negative.   Psychiatric/Behavioral: Negative.      PHYSICAL EXAMINATION Blood pressure (!) 154/68, pulse 81, temperature 99 F (37.2 C), temperature source Oral, resp. rate 20, weight 148 lb 14.4 oz (67.5 kg), SpO2 100 %.  ECOG PERFORMANCE STATUS: 1 - Symptomatic but completely ambulatory  Physical Exam  Constitutional: He is oriented to person, place, and time and well-developed, well-nourished, and in no distress. No distress.  HENT:  Head: Normocephalic and atraumatic.  Mouth/Throat: Oropharynx is clear and moist. No oropharyngeal exudate.  Eyes: Conjunctivae and EOM are normal. Pupils are equal, round, and reactive to light. No scleral icterus.  Neck: Normal range of motion. No thyromegaly present.  Cardiovascular: Normal rate, regular rhythm and normal heart sounds. Exam reveals no gallop and no friction rub.  No murmur heard. Pulmonary/Chest: Effort normal and breath sounds normal. No respiratory distress. He has no wheezes. He has no rales. He exhibits no tenderness.  Abdominal: Soft. Bowel sounds are normal. He exhibits no distension. There is no tenderness.  Palpable fullness in the left upper quadrant of the abdomen with significant dullness to percussion. I cannot distinctly feel the edge of the spleen, but does appear to be significantly enlarged.  Musculoskeletal: Normal range of motion. He exhibits no edema or deformity.  Lymphadenopathy:       Head (right side): No submandibular and no occipital adenopathy present.       Head (left side): No submandibular and no occipital adenopathy present.    He has no cervical adenopathy.    He has no axillary adenopathy.        Right: No inguinal and no supraclavicular adenopathy present.       Left: No inguinal and no supraclavicular adenopathy present.  Neurological: He is alert and oriented to person, place, and time. He has normal reflexes. No cranial nerve deficit.  Skin: Skin is warm and dry. No rash noted. He is not diaphoretic. No erythema. No pallor.     LABORATORY DATA: I have personally reviewed the data as listed: Appointment on 09/22/2017  Component Date Value Ref Range Status  .  WBC 09/22/2017 16.6* 4.0 - 10.3 K/uL Final  . RBC 09/22/2017 4.46  4.20 - 5.82 MIL/uL Final  . Hemoglobin 09/22/2017 13.6  13.0 - 17.1 g/dL Final  . HCT 09/22/2017 40.5  38.4 - 49.9 % Final  . MCV 09/22/2017 90.8  79.3 - 98.0 fL Final  . MCH 09/22/2017 30.5  27.2 - 33.4 pg Final  . MCHC 09/22/2017 33.6  32.0 - 36.0 g/dL Final  . RDW 09/22/2017 13.9  11.0 - 15.6 % Final  . Platelets 09/22/2017 180  140 - 400 K/uL Final  . Smear Review 09/22/2017 ATYPICAL LYMPHS   Final   SMUDGE CELLS  . Neutrophils Relative % 09/22/2017 37  % Final  . Neutro Abs 09/22/2017 6.1  1.5 - 6.5 K/uL Final  . Lymphocytes Relative 09/22/2017 57  % Final  . Lymphs Abs 09/22/2017 9.5* 0.9 - 3.3 K/uL Final  . Monocytes Relative 09/22/2017 5  % Final  . Monocytes Absolute 09/22/2017 0.8  0.1 - 0.9 K/uL Final  . Eosinophils Relative 09/22/2017 1  % Final  . Eosinophils Absolute 09/22/2017 0.2  0.0 - 0.5 K/uL Final  . Basophils Relative 09/22/2017 0  % Final  . Basophils Absolute 09/22/2017 0.0  0.0 - 0.1 K/uL Final   Performed at [facility], [place] [address] [place], [place] [address]  . Sodium 09/22/2017 142  136 - 145 mmol/L Final  . Potassium 09/22/2017 4.2  3.5 - 5.1 mmol/L Final  . Chloride 09/22/2017 105  98 - 109 mmol/L Final  . CO2 09/22/2017 27  22 - 29 mmol/L Final  . Glucose, Bld 09/22/2017 138  70 - 140 mg/dL Final  . BUN 09/22/2017 20  7 - 26 mg/dL Final  . Creatinine, Ser 09/22/2017 0.95  0.70 -  1.30 mg/dL Final  . Calcium 09/22/2017 10.0  8.4 - 10.4 mg/dL Final  . Total Protein 09/22/2017 7.6  6.4 - 8.3 g/dL Final  . Albumin 09/22/2017 4.4  3.5 - 5.0 g/dL Final  . AST 09/22/2017 18  5 - 34 U/L Final  . ALT 09/22/2017 17  0 - 55 U/L Final  . Alkaline Phosphatase 09/22/2017 86  40 - 150 U/L Final  . Total Bilirubin 09/22/2017 0.8  0.2 - 1.2 mg/dL Final  . GFR calc non Af Amer 09/22/2017 >60  >60 mL/min Final  . GFR calc Af Amer 09/22/2017 >60  >60 mL/min Final   Comment: (NOTE) The eGFR has been calculated using the CKD EPI equation. This calculation has not been validated in all clinical situations. eGFR's persistently <60 mL/min signify possible Chronic Kidney Disease.   Georgiann Hahn gap 09/22/2017 10  3 - 11 Final   Performed at [facility], [place] [address] [place], [place] [address]  . LDH 09/22/2017 192  125 - 245 U/L Final   Performed at [facility], [place] [address], [place], [place] [address]  . IgG (Immunoglobin G), Serum 09/22/2017 1,009  700 - 1,600 mg/dL Final  . IgA 09/22/2017 113  61 - 437 mg/dL Final  . IgM (Immunoglobulin M), Srm 09/22/2017 77  15 - 143 mg/dL Final   Comment: (NOTE) Performed At: [facility] [place], [place] [id number] Rush Farmer MD [id number] Performed at [facility], [place] [address] [place], [place] [address]        Ardath Sax, MD

## 2017-10-13 NOTE — Assessment & Plan Note (Signed)
77 y.o. male with a confirmed diagnosis of chronic lymphocytic leukemia, now having completed the staging evaluation. Patient is diagnosed with Rai 1 disease based on presence of lymphocytosis and mild lymphadenopathy without splenomegaly, anemia, or thrombocytopenia. Cytogenetics and FISH are negative for presence of high risk indicators, suggesting presence of intermediate prognosis disease.  Patient has no significant symptoms attributable to his hematological condition.  At this time, I do not recommend any disease - direct therapy, prefer to observe patient.  Plan: --Labs prior to RTC --RTC 3 months for progression monitoring --Next disease assessment with imaging at least one year from the last scan or on appearance of new symptoms.

## 2017-10-15 ENCOUNTER — Other Ambulatory Visit: Payer: Self-pay | Admitting: Osteopathic Medicine

## 2017-10-26 ENCOUNTER — Encounter: Payer: Self-pay | Admitting: Internal Medicine

## 2017-10-26 ENCOUNTER — Ambulatory Visit: Payer: Medicare HMO | Admitting: Internal Medicine

## 2017-10-26 VITALS — BP 120/70 | HR 88 | Ht 67.5 in | Wt 152.0 lb

## 2017-10-26 DIAGNOSIS — K5900 Constipation, unspecified: Secondary | ICD-10-CM

## 2017-10-26 MED ORDER — POLYETHYLENE GLYCOL 3350 17 G PO PACK: 17.0000 g | PACK | Freq: Every day | ORAL | 0 refills | Status: AC

## 2017-10-26 NOTE — Progress Notes (Signed)
   Subjective:    Patient ID: DAVIDJAMES BLANSETT, male    DOB: [birth year], 77 y.o.   MRN: [id number]  HPI Hamlet Lasecki is a 77 year old male with a history of colonic diverticulosis, CLL, hypertension and hyperlipidemia who was seen in November 2018 to evaluate constipation.  He returns today for follow-up.  He is here alone today.  At the time of his last visit he was significantly constipated and was having lower abdominal pain, gas and bloating.  He had been seen in the ER twice for this issue.  We started him on Amitiza 24 mcg twice daily with food.  He reports this worked tremendously well but was very expensive and nearly cost prohibitive.  He reports that it helped him tremendously and he has normalized his bowel habits now with only MiraLAX 17 g daily.  He is having 2-3 soft but formed stools a day which are easy to pass and feel complete.  No further abdominal pain.  No blood in his stool or melena.  Rarely will he miss a day or 2 without bowel movement and he requests samples of Amitiza when this is the case.  No upper GI or hepatobiliary complaint.  Review of Systems As per HPI, otherwise negative  Current Medications, Allergies, Past Medical History, Past Surgical History, Family History and Social History were reviewed in [employer] record.     Objective:   Physical Exam BP 120/70   Pulse 88   Ht 5' 7.5" (1.715 m)   Wt 152 lb (68.9 kg)   BMI 23.46 kg/m  Constitutional: Well-developed and well-nourished. No distress. HEENT: Normocephalic and atraumatic.   No scleral icterus. Neck: Neck supple. Trachea midline. Cardiovascular: Normal rate, regular rhythm and intact distal pulses.  Pulmonary/chest: Effort normal and breath sounds normal. No wheezing, rales or rhonchi. Abdominal: Soft, nontender, nondistended. Bowel sounds active throughout. There are no masses palpable. No hepatosplenomegaly. Extremities: no clubbing, cyanosis, or edema Neurological: Alert and  oriented to person place and time. Skin: Skin is warm and dry. Psychiatric: Normal mood and affect. Behavior is normal.     Assessment & Plan:   77 year old male with a history of colonic diverticulosis, CLL, hypertension and hyperlipidemia who was seen in November 2018 to evaluate constipation.  1.  Constipation --improved after using Amitiza for several weeks.  Now controlled with MiraLAX only.  He is happy with the response and there are no alarm symptoms.  He will continue MiraLAX 17 g daily.  Additional samples provided of Amitiza 24 mcg which he can use twice daily on an as-needed basis.  It sounds that he will need this very infrequently and so he can return as needed for samples.  I told him that if he is needing it on a daily basis to let us know.  He voiced understanding is happy with this plan.  He can follow-up as needed  15 minutes spent with the patient today. Greater than 50% was spent in counseling and coordination of care with the patient

## 2017-10-26 NOTE — Patient Instructions (Signed)
If you are age 76 or older, your body mass index should be between 23-30. Your Body mass index is 23.46 kg/m. If this is out of the aforementioned range listed, please consider follow up with your Primary Care Provider.  If you are age 40 or younger, your body mass index should be between 19-25. Your Body mass index is 23.46 kg/m. If this is out of the aformentioned range listed, please consider follow up with your Primary Care Provider.   Continue using Miralax every day.  We have given you samples of the following medication to take: Amitiza 24 mcg:  Take twice a day as needed  Follow up with [place] as needed.  Thank you, Zenovia Jarred, MD

## 2017-10-27 ENCOUNTER — Ambulatory Visit (INDEPENDENT_AMBULATORY_CARE_PROVIDER_SITE_OTHER): Payer: Medicare HMO | Admitting: Osteopathic Medicine

## 2017-10-27 ENCOUNTER — Encounter: Payer: Self-pay | Admitting: Osteopathic Medicine

## 2017-10-27 VITALS — BP 135/67 | HR 83 | Temp 98.1°F | Wt 150.0 lb

## 2017-10-27 DIAGNOSIS — C9110 Chronic lymphocytic leukemia of B-cell type not having achieved remission: Secondary | ICD-10-CM

## 2017-10-27 DIAGNOSIS — K5901 Slow transit constipation: Secondary | ICD-10-CM

## 2017-10-27 DIAGNOSIS — C9190 Lymphoid leukemia, unspecified not having achieved remission: Secondary | ICD-10-CM

## 2017-10-27 DIAGNOSIS — E119 Type 2 diabetes mellitus without complications: Secondary | ICD-10-CM

## 2017-10-27 LAB — LIPID PANEL
CHOL/HDL RATIO: 3.2 (calc) (ref <5.0)
CHOLESTEROL: 122 mg/dL (ref <200)
HDL: 38 mg/dL — ABNORMAL LOW (ref >40)
LDL CHOLESTEROL (CALC): 68 mg/dL
Non-HDL Cholesterol (Calc): 84 mg/dL (calc) (ref <130)
Triglycerides: 75 mg/dL (ref <150)

## 2017-10-27 LAB — T4, FREE: Free T4: 1 ng/dL (ref 0.8–1.8)

## 2017-10-27 LAB — POCT GLYCOSYLATED HEMOGLOBIN (HGB A1C): HEMOGLOBIN A1C: 6.2

## 2017-10-27 LAB — TSH: TSH: 1.42 mIU/L (ref 0.40–4.50)

## 2017-10-27 MED ORDER — METFORMIN HCL 500 MG PO TABS: 500.0000 mg | ORAL_TABLET | Freq: Every day | ORAL | 3 refills | Status: DC

## 2017-10-27 MED ORDER — ATORVASTATIN CALCIUM 10 MG PO TABS: 10.0000 mg | ORAL_TABLET | Freq: Every day | ORAL | 3 refills | Status: DC

## 2017-10-27 NOTE — [place] Notes (Signed)
HPI: Tony Banks is a 76 y.o. male  who presents to  [facility] today, 10/27/17,  for chief complaint of:  Follow-up sugars   DM2: At last visit, A1c has been well below goal for age at 6.1, 6.0. We reduced dose of metformin prior to last visit and A1C still looked fine. He is here today to follow-up for repeat A1c, has gone back up to bid dose but still feeling fine no hypoglycemia.  HTN: BP looks good today. Patient has home blood pressure cuff at home that his wife uses but he does not really use it. No chest pain, pressure, shortness of breath.  CLL: following w/ oncology, no concerns.   Constipation: following with GI, has samples of Amitiza as needed - has asmples from GI, on MiraLax preventive dose.   Last lipids 04/2017 were fine. Last CMP 04/2017 done here was fine and also looked good on HemOnc labs a month ago.     Past medical history, surgical history, social history and family history reviewed.  Patient Active Problem List   Diagnosis Date Noted  . Chalazion left upper eyelid 08/18/2016  . Type 2 diabetes mellitus (HCC) 07/13/2015  . Hyperlipidemia 04/07/2015  . Hearing loss 04/06/2015  . Essential hypertension 10/27/2014  . Abdominal pain 04/26/2014  . Constipation 04/26/2014  . 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   Current Outpatient Medications on File Prior to Visit  Medication Sig Dispense Refill  . ACCU-CHEK SOFTCLIX LANCETS lancets Use as instructed 100 each 12  . AMBULATORY NON FORMULARY MEDICATION Accu-chek aviva plus test strips and accu-chek softclix lancets, use to check blood sugar up to four times a day. Dx Type 2 diabetes. 100 Units 3  . atorvastatin (LIPITOR) 10 MG tablet Take 1 tablet (10 mg total) by mouth daily. Pt needs to keep appt for refills 30 tablet 0  . glucose blood (ACCU-CHEK AVIVA PLUS) test strip USE TO CHECK  BLOOD SUGAR UP TO 4 TIMES A DAY 100 each 12  . metFORMIN (GLUCOPHAGE) 500 MG tablet Take 1 tablet (500 mg total) by mouth daily with breakfast. 180 tablet 3  . Multiple Vitamin (MULTIVITAMIN WITH MINERALS) TABS tablet Take 1 tablet daily by mouth.    . Omega-3 Fatty Acids (FISH OIL PO) Take by mouth.    . polyethylene glycol (MIRALAX) packet Take 17 g by mouth daily. 28 each 0  . linaclotide (LINZESS) 145 MCG CAPS capsule Take 1 capsule (145 mcg total) daily before breakfast by mouth. (Patient not taking: Reported on 10/27/2017) 30 capsule 2  . lubiprostone (AMITIZA) 24 MCG capsule Take 1 capsule (24 mcg total) by mouth 2 (two) times daily with a meal. (Patient not taking: Reported on 10/27/2017) 60 capsule 5  . magnesium citrate SOLN Take 296 mLs (1 Bottle total) daily by mouth. (Patient not taking: Reported on 10/27/2017) 2 Bottle 1   No current facility-administered medications on file prior to visit.    Allergies  Allergen Reactions  . Shellfish Allergy Other (See Comments)    Leg swelling       Review of Systems:  Constitutional: No recent illness  Cardiac: No  chest pain  Respiratory:  No  shortness of breath.    Exam:  BP 135/67   Pulse 83   Temp 98.1 F (36.7 C) (Oral)   Wt 150 lb (68 kg)   BMI 23.15 kg/m   Constitutional: VS see above.   General Appearance: alert, well-developed, well-nourished, NAD  Respiratory: Normal respiratory effort. no wheeze, no rhonchi, no rales  Cardiovascular: S1/S2 normal, RRR.   Musculoskeletal: Gait normal. Symmetric and independent movement of all extremities   Neurological: Normal balance/coordination. No tremor.  Skin: warm, dry, intact.   Psychiatric: Normal judgment/insight. Normal mood and affect. Oriented x3.    Recent Results (from the past 2160 hour(s))  CBC with Differential     Status: Abnormal   Collection Time: 09/22/17  2:12 PM  Result Value Ref Range   WBC 16.6 (H) 4.0 - 10.3 K/uL   RBC 4.46 4.20 - 5.82 MIL/uL    Hemoglobin 13.6 13.0 - 17.1 g/dL   HCT 40.5 38.4 - 49.9 %   MCV 90.8 79.3 - 98.0 fL   MCH 30.5 27.2 - 33.4 pg   MCHC 33.6 32.0 - 36.0 g/dL   RDW 13.9 11.0 - 15.6 %   Platelets 180 140 - 400 K/uL   Smear Review ATYPICAL LYMPHS     Comment: SMUDGE CELLS   Neutrophils Relative % 37 %   Neutro Abs 6.1 1.5 - 6.5 K/uL   Lymphocytes Relative 57 %   Lymphs Abs 9.5 (H) 0.9 - 3.3 K/uL   Monocytes Relative 5 %   Monocytes Absolute 0.8 0.1 - 0.9 K/uL   Eosinophils Relative 1 %   Eosinophils Absolute 0.2 0.0 - 0.5 K/uL   Basophils Relative 0 %   Basophils Absolute 0.0 0.0 - 0.1 K/uL    Comment: Performed at [facility], [address], [place], [place] [address]  Comprehensive metabolic panel     Status: None   Collection Time: 09/22/17  2:12 PM  Result Value Ref Range   Sodium 142 136 - 145 mmol/L   Potassium 4.2 3.5 - 5.1 mmol/L   Chloride 105 98 - 109 mmol/L   CO2 27 22 - 29 mmol/L   Glucose, Bld 138 70 - 140 mg/dL   BUN 20 7 - 26 mg/dL   Creatinine, Ser 0.95 0.70 - 1.30 mg/dL   Calcium 10.0 8.4 - 10.4 mg/dL   Total Protein 7.6 6.4 - 8.3 g/dL   Albumin 4.4 3.5 - 5.0 g/dL   AST 18 5 - 34 U/L   ALT 17 0 - 55 U/L   Alkaline Phosphatase 86 40 - 150 U/L   Total Bilirubin 0.8 0.2 - 1.2 mg/dL   GFR calc non Af Amer >60 >60 mL/min   GFR calc Af Amer >60 >60 mL/min    Comment: (NOTE) The eGFR has been calculated using the CKD EPI equation. This calculation has not been validated in all clinical situations. eGFR's persistently <60 mL/min signify possible Chronic Kidney Disease.    Anion gap 10 3 - 11    Comment: Performed at [facility], [address] [place], [place] [address]  Lactate dehydrogenase (LDH)     Status: None   Collection Time: 09/22/17  2:12 PM  Result Value Ref Range   LDH 192 125 - 245 U/L    Comment: Performed at [facility], [place] [address], [place], [place] [address]  QIG  America Brown.  immunoglobulins  - IgG, IgA, IgM)     Status: None   Collection Time: 09/22/17  2:12 PM  Result Value Ref Range   IgG (Immunoglobin G), Serum 1,009 700 - 1,600 mg/dL   IgA 113 61 - 437 mg/dL   IgM (Immunoglobulin M), Srm 77 15 - 143  mg/dL    Comment: (NOTE) Performed At: [facility] [address] [place], [place] [id number] Nagendra Sanjai MD Ph:[phone] Performed at [facility], [address] [place], [place] [address]      Depression screen PHQ 2/9 10/27/2017 04/26/2017 04/26/2016  Decreased Interest 0 0 0  Down, Depressed, Hopeless 0 0 0  PHQ - 2 Score 0 0 0  Altered sleeping 0 - -  Tired, decreased energy 0 - -  Change in appetite 0 - -  Feeling bad or failure about yourself  0 - -  Trouble concentrating 0 - -  Moving slowly or fidgety/restless 0 - -  Suicidal thoughts 0 - -  PHQ-9 Score 0 - -    Results for orders placed or performed in visit on 10/27/17 (from the past 24 hour(s))  POCT HgB A1C     Status: None   Collection Time: 10/27/17 11:07 AM  Result Value Ref Range   Hemoglobin A1C 6.2      ASSESSMENT/PLAN: Chronic conditions stable, I think worth checking TSH given severity of constipation, he'll think about getting this blood work   Type 2 diabetes mellitus without complication, without long-term current use of insulin (HCC) - Plan: POCT HgB A1C, atorvastatin (LIPITOR) 10 MG tablet, metFORMIN (GLUCOPHAGE) 500 MG tablet, Lipid panel  Slow transit constipation - Plan: TSH, T4, free  CLL (chronic lymphocytic leukemia) (HCC)       Follow-up plan: Return in about 6 months (around 04/26/2018) for MEDICARE ANNUAL WELLNESS VISIT, sooner if needed .  Visit summary with medication list and pertinent instructions was printed for patient to review, alert us if any changes needed. All questions at time of visit were answered - patient instructed to contact office with any additional concerns. ER/RTC precautions were reviewed with  the patient and understanding verbalized.   

## 2017-11-19 ENCOUNTER — Other Ambulatory Visit: Payer: Self-pay | Admitting: Osteopathic Medicine

## 2017-12-12 ENCOUNTER — Other Ambulatory Visit: Payer: Self-pay | Admitting: Osteopathic Medicine

## 2017-12-12 DIAGNOSIS — E119 Type 2 diabetes mellitus without complications: Secondary | ICD-10-CM

## 2017-12-21 ENCOUNTER — Inpatient Hospital Stay: Payer: Medicare HMO

## 2017-12-21 ENCOUNTER — Inpatient Hospital Stay: Payer: Medicare HMO | Attending: Hematology and Oncology | Admitting: Hematology and Oncology

## 2017-12-21 ENCOUNTER — Telehealth: Payer: Self-pay | Admitting: Hematology and Oncology

## 2017-12-21 VITALS — BP 151/74 | HR 73 | Temp 99.0°F | Resp 18 | Ht 67.5 in | Wt 149.5 lb

## 2017-12-21 DIAGNOSIS — K5900 Constipation, unspecified: Secondary | ICD-10-CM | POA: Insufficient documentation

## 2017-12-21 DIAGNOSIS — C9190 Lymphoid leukemia, unspecified not having achieved remission: Secondary | ICD-10-CM | POA: Diagnosis not present

## 2017-12-21 DIAGNOSIS — C9110 Chronic lymphocytic leukemia of B-cell type not having achieved remission: Secondary | ICD-10-CM

## 2017-12-21 DIAGNOSIS — Z87442 Personal history of urinary calculi: Secondary | ICD-10-CM | POA: Diagnosis not present

## 2017-12-21 DIAGNOSIS — K5790 Diverticulosis of intestine, part unspecified, without perforation or abscess without bleeding: Secondary | ICD-10-CM

## 2017-12-21 DIAGNOSIS — E785 Hyperlipidemia, unspecified: Secondary | ICD-10-CM | POA: Diagnosis not present

## 2017-12-21 DIAGNOSIS — R200 Anesthesia of skin: Secondary | ICD-10-CM | POA: Diagnosis not present

## 2017-12-21 DIAGNOSIS — M549 Dorsalgia, unspecified: Secondary | ICD-10-CM | POA: Diagnosis not present

## 2017-12-21 DIAGNOSIS — Z809 Family history of malignant neoplasm, unspecified: Secondary | ICD-10-CM | POA: Insufficient documentation

## 2017-12-21 DIAGNOSIS — G8929 Other chronic pain: Secondary | ICD-10-CM | POA: Diagnosis not present

## 2017-12-21 DIAGNOSIS — I10 Essential (primary) hypertension: Secondary | ICD-10-CM | POA: Diagnosis not present

## 2017-12-21 DIAGNOSIS — Z7984 Long term (current) use of oral hypoglycemic drugs: Secondary | ICD-10-CM | POA: Insufficient documentation

## 2017-12-21 DIAGNOSIS — E119 Type 2 diabetes mellitus without complications: Secondary | ICD-10-CM | POA: Diagnosis not present

## 2017-12-21 DIAGNOSIS — Z87891 Personal history of nicotine dependence: Secondary | ICD-10-CM | POA: Insufficient documentation

## 2017-12-21 DIAGNOSIS — Z79899 Other long term (current) drug therapy: Secondary | ICD-10-CM | POA: Diagnosis not present

## 2017-12-21 LAB — CMP (CANCER CENTER ONLY)
ALBUMIN: 4.4 g/dL (ref 3.5–5.0)
ALT: 24 U/L (ref 0–55)
ANION GAP: 8 (ref 3–11)
AST: 26 U/L (ref 5–34)
Alkaline Phosphatase: 83 U/L (ref 40–150)
BUN: 21 mg/dL (ref 7–26)
CHLORIDE: 107 mmol/L (ref 98–109)
CO2: 27 mmol/L (ref 22–29)
CREATININE: 1.01 mg/dL (ref 0.70–1.30)
Calcium: 9.6 mg/dL (ref 8.4–10.4)
GFR, Estimated: >60 mL/min (ref >60)
Glucose, Bld: 132 mg/dL (ref 70–140)
Potassium: 4.9 mmol/L (ref 3.5–5.1)
SODIUM: 142 mmol/L (ref 136–145)
Total Bilirubin: 0.9 mg/dL (ref 0.2–1.2)
Total Protein: 7.5 g/dL (ref 6.4–8.3)

## 2017-12-21 LAB — CBC WITH DIFFERENTIAL (CANCER CENTER ONLY)
BASOS ABS: 0 10*3/uL (ref 0.0–0.1)
Basophils Relative: 0 %
Eosinophils Absolute: 0.2 10*3/uL (ref 0.0–0.5)
Eosinophils Relative: 1 %
HCT: 39.1 % (ref 38.4–49.9)
HEMOGLOBIN: 13.1 g/dL (ref 13.0–17.1)
LYMPHS ABS: 9.7 10*3/uL — AB (ref 0.9–3.3)
LYMPHS PCT: 63 %
MCH: 30.9 pg (ref 27.2–33.4)
MCHC: 33.5 g/dL (ref 32.0–36.0)
MCV: 92.2 fL (ref 79.3–98.0)
Monocytes Absolute: 0.7 10*3/uL (ref 0.1–0.9)
Monocytes Relative: 4 %
NEUTROS PCT: 32 %
Neutro Abs: 5 10*3/uL (ref 1.5–6.5)
Platelet Count: 146 10*3/uL (ref 140–400)
RBC: 4.24 MIL/uL (ref 4.20–5.82)
RDW: 13.8 % (ref 11.0–14.6)
WBC Count: 15.5 10*3/uL — ABNORMAL HIGH (ref 4.0–10.3)

## 2017-12-21 LAB — LACTATE DEHYDROGENASE: LDH: 150 U/L (ref 125–245)

## 2017-12-21 LAB — URIC ACID: Uric Acid, Serum: 5.2 mg/dL (ref 2.6–7.4)

## 2017-12-21 NOTE — Telephone Encounter (Signed)
Scheduled appt per 4/18 los - Gave patient aVS and calender per los.  

## 2017-12-22 LAB — BETA 2 MICROGLOBULIN, SERUM: BETA 2 MICROGLOBULIN: 2 mg/L (ref 0.6–2.4)

## 2017-12-22 LAB — IGG, IGA, IGM
IgA: 119 mg/dL (ref 61–437)
IgG (Immunoglobin G), Serum: 1026 mg/dL (ref 700–1600)
IgM (Immunoglobulin M), Srm: 73 mg/dL (ref 15–143)

## 2018-01-08 NOTE — Assessment & Plan Note (Signed)
77 y.o. male with a confirmed diagnosis of chronic lymphocytic leukemia, now having completed the staging evaluation. Patient is diagnosed with Rai 1 disease based on presence of lymphocytosis and mild lymphadenopathy without splenomegaly, anemia, or thrombocytopenia. Cytogenetics and FISH are negative for presence of high risk indicators, suggesting presence of intermediate prognosis disease.  Patient has no significant symptoms attributable to his hematological condition.  At this time, I do not recommend any disease-directed therapy, prefer to observe patient.  Plan: --Labs prior to RTC --RTC 3 months for progression monitoring --Next disease assessment with imaging on appearance of new symptoms.

## 2018-01-08 NOTE — [place] Notes (Signed)
[facility] Follow-up Visit:  Assessment: CLL (chronic lymphocytic leukemia) (HCC) 77 y.o. male with a confirmed diagnosis of chronic lymphocytic leukemia, now having completed the staging evaluation. Patient is diagnosed with Rai 1 disease based on presence of lymphocytosis and mild lymphadenopathy without splenomegaly, anemia, or thrombocytopenia. Cytogenetics and FISH are negative for presence of high risk indicators, suggesting presence of intermediate prognosis disease.  Patient has no significant symptoms attributable to his hematological condition.  At this time, I do not recommend any disease-directed therapy, prefer to observe patient.  Plan: --Labs prior to RTC --RTC 3 months for progression monitoring --Next disease assessment with imaging on appearance of new symptoms.  Voice recognition software was used and creation of this note. Despite my best effort at editing the text, some misspelling/errors may have occurred.  Orders Placed This Encounter  Procedures  . CBC with Differential (Cancer Center Only)    Standing Status:   Future    Standing Expiration Date:   12/22/2018  . CMP (Cancer Center only)    Standing Status:   Future    Standing Expiration Date:   12/22/2018  . Lactate dehydrogenase (LDH)    Standing Status:   Future    Standing Expiration Date:   12/21/2018  . QIG  (Quant. immunoglobulins  - IgG, IgA, IgM)    Standing Status:   Future    Standing Expiration Date:   12/21/2018    Cancer Staging CLL (chronic lymphocytic leukemia) (HCC) Staging form: Chronic Lymphocytic Leukemia / Small Lymphocytic Lymphoma, AJCC 8th Edition - Clinical stage from 06/22/2017: Modified Rai Stage I (Modified Rai risk: Intermediate, Lymphocytosis: Present, Adenopathy: Present, Organomegaly: Absent, Anemia: Absent, Thrombocytopenia: Absent) - Signed by ,  G, MD on 06/28/2017   All questions were answered.  . The patient knows to call the clinic  with any problems, questions or concerns.  This note was electronically signed.    History of Presenting Illness Tony Banks is a 77 y.o. followed in the Cancer Center for Chronic Lymphocytic Leukemia. On review of the labs, leukocytosis has been present since 2015, please see the blood count trends outlined below.   At this time, patient reports no fevers, chills, unexpected weight loss or change in energy level. He does complain off significant night sweats. He occasionally has pain in the posterior thighs and pain in the bones of his legs. The pain usually is short-lived. Complains of chronic back pain. He also complains of numbness in bilateral feet for the past 2 years. Denies any chest pain, shortness of breath, or cough. No, dysphasia, early satiety, abdominal pain, diarrhea, or constipation. No dysuria or hematuria. No focal weakness in the extremities or face.  Recent returns to the clinic today to review our lab work findings.  Denies any new complaints since last visit to the clinic.  Oncological/hematological History: --Labs, 04/27/14: WBC   7.5, ALC   ..., Hgb 12.3, Plt 181; --Labs, 04/06/15: WBC 10.7, ALC   ..., Hgb 13.8, Plt 210; --Labs, 04/27/16: WBC 13.5, ALC   ..., Hgb 13.4, Plt 185; --Labs, 04/19/17: WBC 14.7, ALC 8.1, Hgb 13.5, Plt 186; Smear -- atypical lymphocytes present --Labs, 05/29/17: WBC 16.3, ALC 8.8, Hgb 12.8, Plt 151; LDH 165, beta-2 microglobulin 1.6; IgG 911, IgA 128, IgM 83; Flow Cyto -- monoclonal B cells present, positive for CD19, CD20, CD5, CD23 comprising 73% of all lymphocytes; Cytogenetics/FISH -- Normal cytogenetics, FISH negative for del17p, del11q, del13q .  --CT C/A/P, 06/20/17: Mildly enlarged porta   hepatis/right upper quadrant lymph nodes. Largest portacaval lymph node has a short axis diameter of 14 mm. No other enlarged lymph nodes in the neck, chest, abdomen or pelvis. Spleen is normal size with a craniocaudal length of 10.4 cm. No focal splenic  abnormality. --Labs, 09/22/17: WBC 16.6, ALC 9.5, Hgb 13.6, Plt 180; LDH 192 --Labs, 12/21/17: WBC 15.5, ALC 9.7, Hgb 13.1, Plt 146; LDH 150, beta-2 microglobulin 2.0, IgG 1026   Medical History: Past Medical History:  Diagnosis Date  . Aortic atherosclerosis ([place])   . CLL (chronic lymphocytic leukemia) ([place])   . Constipation    severe  . Diabetes mellitus without complication ([place])   . Diverticulosis   . HLD (hyperlipidemia)   . Hypertension   . Nephrolithiasis     Surgical History: Past Surgical History:  Procedure Laterality Date  . FINGER FRACTURE SURGERY Right    middle finger  . SHOULDER ARTHROSCOPY Bilateral   . VASECTOMY      Family History: Family History  Problem Relation Age of Onset  . Heart disease Mother   . Stroke Mother   . Cancer Father   . Stroke Maternal Grandfather   . Colon cancer Neg Hx   . Pancreatic cancer Neg Hx   . Rectal cancer Neg Hx   . Stomach cancer Neg Hx     Social History: Social History   Socioeconomic History  . Marital status: Married    Spouse name: Not on file  . Number of children: 0  . Years of education: Not on file  . Highest education level: Not on file  Occupational History  . Occupation: retired  Scientific laboratory technician  . Financial resource strain: Not on file  . Food insecurity:    Worry: Not on file    Inability: Not on file  . Transportation needs:    Medical: Not on file    Non-medical: Not on file  Tobacco Use  . Smoking status: Former Smoker    Last attempt to quit: 07/31/1977    Years since quitting: 40.4  . Smokeless tobacco: Never Used  . Tobacco comment: quit 30 yrs ago  Substance and Sexual Activity  . Alcohol use: No  . Drug use: No  . Sexual activity: Never    Birth control/protection: Abstinence  Lifestyle  . Physical activity:    Days per week: Not on file    Minutes per session: Not on file  . Stress: Not on file  Relationships  . Social connections:    Talks on phone: Not on file     Gets together: Not on file    Attends religious service: Not on file    Active member of club or organization: Not on file    Attends meetings of clubs or organizations: Not on file    Relationship status: Not on file  . Intimate partner violence:    Fear of current or ex partner: Not on file    Emotionally abused: Not on file    Physically abused: Not on file    Forced sexual activity: Not on file  Other Topics Concern  . Not on file  Social History Narrative  . Not on file    Allergies: Allergies  Allergen Reactions  . Shellfish Allergy Other (See Comments)    Leg swelling     Medications:  Current Outpatient Medications  Medication Sig Dispense Refill  . ACCU-CHEK AVIVA PLUS test strip USE TO CHECK BLOOD SUGAR UP TO 4 TIMES A DAY 100 each  3  . ACCU-CHEK SOFTCLIX LANCETS lancets Use as instructed 100 each 12  . AMBULATORY NON FORMULARY MEDICATION Accu-chek aviva plus test strips and accu-chek softclix lancets, use to check blood sugar up to four times a day. Dx Type 2 diabetes. 100 Units 3  . atorvastatin (LIPITOR) 10 MG tablet Take 1 tablet (10 mg total) by mouth daily. Pt needs to keep appt for refills 90 tablet 3  . linaclotide (LINZESS) 145 MCG CAPS capsule Take 1 capsule (145 mcg total) daily before breakfast by mouth. (Patient not taking: Reported on 10/27/2017) 30 capsule 2  . lubiprostone (AMITIZA) 24 MCG capsule Take 1 capsule (24 mcg total) by mouth 2 (two) times daily with a meal. (Patient not taking: Reported on 10/27/2017) 60 capsule 5  . metFORMIN (GLUCOPHAGE) 500 MG tablet Take 1 tablet (500 mg total) by mouth daily with breakfast. 90 tablet 3  . Multiple Vitamin (MULTIVITAMIN WITH MINERALS) TABS tablet Take 1 tablet daily by mouth.    . Omega-3 Fatty Acids (FISH OIL PO) Take by mouth.    . polyethylene glycol (MIRALAX) packet Take 17 g by mouth daily. 28 each 0   No current facility-administered medications for this visit.     Review of Systems: Review of  Systems  Constitutional: Positive for diaphoresis. Negative for appetite change, chills, fatigue and fever.  HENT:  Negative.   Eyes: Negative.   Respiratory: Negative.   Cardiovascular: Negative.   Gastrointestinal: Positive for constipation.  Endocrine: Negative.   Genitourinary: Negative.    Musculoskeletal: Positive for arthralgias and back pain.  Skin: Negative.   Neurological: Positive for numbness. Negative for extremity weakness.  Hematological: Negative.   Psychiatric/Behavioral: Negative.      PHYSICAL EXAMINATION Blood pressure (!) 151/74, pulse 73, temperature 99 F (37.2 C), temperature source Oral, resp. rate 18, height 5' 7.5" (1.715 m), weight 149 lb 8 oz (67.8 kg), SpO2 99 %.  ECOG PERFORMANCE STATUS: 1 - Symptomatic but completely ambulatory  Physical Exam  Constitutional: He is oriented to person, place, and time and well-developed, well-nourished, and in no distress. No distress.  HENT:  Head: Normocephalic and atraumatic.  Mouth/Throat: Oropharynx is clear and moist. No oropharyngeal exudate.  Eyes: Pupils are equal, round, and reactive to light. Conjunctivae and EOM are normal. No scleral icterus.  Neck: Normal range of motion. No thyromegaly present.  Cardiovascular: Normal rate, regular rhythm and normal heart sounds. Exam reveals no gallop and no friction rub.  No murmur heard. Pulmonary/Chest: Effort normal and breath sounds normal. No respiratory distress. He has no wheezes. He has no rales. He exhibits no tenderness.  Abdominal: Soft. Bowel sounds are normal. He exhibits no distension. There is no tenderness.  Palpable fullness in the left upper quadrant of the abdomen with significant dullness to percussion. I cannot distinctly feel the edge of the spleen, but does appear to be significantly enlarged.  Musculoskeletal: Normal range of motion. He exhibits no edema or deformity.  Lymphadenopathy:       Head (right side): No submandibular and no  occipital adenopathy present.       Head (left side): No submandibular and no occipital adenopathy present.    He has no cervical adenopathy.    He has no axillary adenopathy.       Right: No inguinal and no supraclavicular adenopathy present.       Left: No inguinal and no supraclavicular adenopathy present.  Neurological: He is alert and oriented to person, place, and time. He has  normal reflexes. No cranial nerve deficit.  Skin: Skin is warm and dry. No rash noted. He is not diaphoretic. No erythema. No pallor.     LABORATORY DATA: I have personally reviewed the data as listed: Appointment on 12/21/2017  Component Date Value Ref Range Status  . IgG (Immunoglobin G), Serum 12/21/2017 1,026  700 - 1,600 mg/dL Final  . IgA 12/21/2017 119  61 - 437 mg/dL Final  . IgM (Immunoglobulin M), Srm 12/21/2017 73  15 - 143 mg/dL Final   Comment: (NOTE) Performed At: [facility] [place], [place] [id number] Rush Farmer MD [id number] Performed at [facility], [place] [address] [place], [place] [address]   . Beta-2 Microglobulin 12/21/2017 2.0  0.6 - 2.4 mg/L Final   Comment: (NOTE) Siemens Immulite 2000 Immunochemiluminometric assay (ICMA) Values obtained with different assay methods or kits cannot be used interchangeably. Results cannot be interpreted as absolute evidence of the presence or absence of malignant disease. Performed At: [facility] [place], [place] [id number] Rush Farmer MD JT:[id number] Performed at [facility], [place] [address] [place], [place] [address]   . Uric Acid, Serum 12/21/2017 5.2  2.6 - 7.4 mg/dL Final   Performed at [facility], [place] [address] [place], [place] [address]  . LDH 12/21/2017 150  125 - 245 U/L Final   Performed at [facility], [place] [address], [place], [place] [address]  . Sodium 12/21/2017 142   136 - 145 mmol/L Final  . Potassium 12/21/2017 4.9  3.5 - 5.1 mmol/L Final  . Chloride 12/21/2017 107  98 - 109 mmol/L Final  . CO2 12/21/2017 27  22 - 29 mmol/L Final  . Glucose, Bld 12/21/2017 132  70 - 140 mg/dL Final  . BUN 12/21/2017 21  7 - 26 mg/dL Final  . Creatinine 12/21/2017 1.01  0.70 - 1.30 mg/dL Final  . Calcium 12/21/2017 9.6  8.4 - 10.4 mg/dL Final  . Total Protein 12/21/2017 7.5  6.4 - 8.3 g/dL Final  . Albumin 12/21/2017 4.4  3.5 - 5.0 g/dL Final  . AST 12/21/2017 26  5 - 34 U/L Final  . ALT 12/21/2017 24  0 - 55 U/L Final  . Alkaline Phosphatase 12/21/2017 83  40 - 150 U/L Final  . Total Bilirubin 12/21/2017 0.9  0.2 - 1.2 mg/dL Final  . GFR, Est Non Af Am 12/21/2017 >60  >60 mL/min Final  . GFR, Est AFR Am 12/21/2017 >60  >60 mL/min Final   Comment: (NOTE) The eGFR has been calculated using the CKD EPI equation. This calculation has not been validated in all clinical situations. eGFR's persistently <60 mL/min signify possible Chronic Kidney Disease.   Georgiann Hahn gap 12/21/2017 8  3 - 11 Final   Performed at [facility], [place] [address], [place], [place] [address]  . WBC Count 12/21/2017 15.5* 4.0 - 10.3 K/uL Final  . RBC 12/21/2017 4.24  4.20 - 5.82 MIL/uL Final  . Hemoglobin 12/21/2017 13.1  13.0 - 17.1 g/dL Final  . HCT 12/21/2017 39.1  38.4 - 49.9 % Final  . MCV 12/21/2017 92.2  79.3 - 98.0 fL Final  . MCH 12/21/2017 30.9  27.2 - 33.4 pg Final  . MCHC 12/21/2017 33.5  32.0 - 36.0 g/dL Final  . RDW 12/21/2017 13.8  11.0 - 14.6 % Final  . Platelet Count 12/21/2017 146  140 - 400 K/uL  Final  . Smear Review 12/21/2017 ATYPICAL LYMPHS   Final  . Neutrophils Relative % 12/21/2017 32  % Final  . Neutro Abs 12/21/2017 5.0  1.5 - 6.5 K/uL Final  . Lymphocytes Relative 12/21/2017 63  % Final  . Lymphs Abs 12/21/2017 9.7* 0.9 - 3.3 K/uL Final  . Monocytes Relative 12/21/2017 4  % Final  . Monocytes Absolute 12/21/2017 0.7  0.1 - 0.9 K/uL Final   . Eosinophils Relative 12/21/2017 1  % Final  . Eosinophils Absolute 12/21/2017 0.2  0.0 - 0.5 K/uL Final  . Basophils Relative 12/21/2017 0  % Final  . Basophils Absolute 12/21/2017 0.0  0.0 - 0.1 K/uL Final   Performed at [facility], [place] [address], [place], [place] [address]       Ardath Sax, MD

## 2018-01-26 ENCOUNTER — Telehealth: Payer: Self-pay | Admitting: Hematology and Oncology

## 2018-01-26 NOTE — Telephone Encounter (Signed)
Called pt re appts being moved to [place] - left vm for pt re appts and sending confirmation letter in the mail.

## 2018-01-31 DIAGNOSIS — H5310 Unspecified subjective visual disturbances: Secondary | ICD-10-CM | POA: Diagnosis not present

## 2018-01-31 DIAGNOSIS — Z961 Presence of intraocular lens: Secondary | ICD-10-CM | POA: Diagnosis not present

## 2018-01-31 DIAGNOSIS — H26491 Other secondary cataract, right eye: Secondary | ICD-10-CM | POA: Diagnosis not present

## 2018-01-31 DIAGNOSIS — H43811 Vitreous degeneration, right eye: Secondary | ICD-10-CM | POA: Diagnosis not present

## 2018-03-21 NOTE — Progress Notes (Signed)
HEMATOLOGY/ONCOLOGY CONSULTATION NOTE  Date of Service: 03/22/2018  Patient Care Team: Emeterio Reeve, DO as PCP - General (Osteopathic Medicine)  CHIEF COMPLAINTS/PURPOSE OF CONSULTATION:  Chronic Lymphocytic Leukemia   HISTORY OF PRESENTING ILLNESS:   Tony Banks is a wonderful 77 y.o. male who has been previously followed by my colleague Dr. 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 He is accompanied today by his wife. The pt reports that he is doing well overall. Verbal consent has been given by the pt for a clinical observer, Mathis Fare, to be present.    The pt was diagnosed with CLL in October 2018, following a Peripheral blood flow cytometry and cytogenetics report. 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 The pt notes that his CLL was picked up off of routine labs and was not prompted by any associated symptoms.   The pt reports that he has not had any symptoms or concerns in the last 3 months he denies any constitutional symptoms or noticing any new lumps or bumps.    Most recent lab results (03/22/18) of CBC w/diff, CMP is as follows: all values are WNL except for WBC at 18.4k, Lymphs abs at 11.9k, Glucose at 128. LDH 03/22/18 is normal at 160 QIG 03/22/18 is pending  On review of systems, pt reports good energy levels, and denies fevers, chills, night sweats, unexpected weight loss, noticing any new lumps or bumps, abdominal pains, and any other symptoms.   MEDICAL HISTORY:  Past Medical History:  Diagnosis Date   Aortic atherosclerosis (HCC)    CLL (chronic lymphocytic leukemia) (HCC)    Constipation    severe   Diabetes mellitus without complication (HCC)    Diverticulosis    HLD (hyperlipidemia)    Hypertension    Nephrolithiasis     SURGICAL HISTORY: Past Surgical History:  Procedure Laterality Date   FINGER FRACTURE SURGERY Right    middle finger    SHOULDER ARTHROSCOPY Bilateral    VASECTOMY      SOCIAL HISTORY: Social History   Socioeconomic History   Marital status: Married    Spouse name: Not on file   Number of children: 0   Years of education: Not on file   Highest education level: Not on file  Occupational History   Occupation: retired  Scientist, product/process development strain: Not on file   Food insecurity:    Worry: Not on file    Inability: Not on file   Transportation needs:    Medical: Not on file    Non-medical: Not on file  Tobacco Use   Smoking status: Former Smoker    Last attempt to quit: 07/31/1977    Years since quitting: 40.6   Smokeless tobacco: Never Used   Tobacco comment: quit 30 yrs ago  Substance and Sexual Activity   Alcohol use: No   Drug use: No   Sexual activity: Never    Birth control/protection: Abstinence  Lifestyle   Physical activity:    Days per week: Not on file    Minutes per session: Not on file   Stress: Not on file  Relationships   Social connections:    Talks on phone: Not on file    Gets together: Not on file    Attends religious service: Not on file    Active member of club or organization: Not on file    Attends meetings of clubs or  organizations: Not on file    Relationship status: Not on file   Intimate partner violence:    Fear of current or ex partner: Not on file    Emotionally abused: Not on file    Physically abused: Not on file    Forced sexual activity: Not on file  Other Topics Concern   Not on file  Social History Narrative   Not on file    FAMILY HISTORY: Family History  Problem Relation Age of Onset   Heart disease Mother    Stroke Mother    Cancer Father    Stroke Maternal Grandfather    Colon cancer Neg Hx    Pancreatic cancer Neg Hx    Rectal cancer Neg Hx    Stomach cancer Neg Hx     ALLERGIES:  is allergic to shellfish allergy.  MEDICATIONS:  Current Outpatient Medications  Medication Sig  Dispense Refill   ACCU-CHEK AVIVA PLUS test strip USE TO CHECK BLOOD SUGAR UP TO 4 TIMES A DAY 100 each 3   ACCU-CHEK SOFTCLIX LANCETS lancets Use as instructed 100 each 12   AMBULATORY NON FORMULARY MEDICATION Accu-chek aviva plus test strips and accu-chek softclix lancets, use to check blood sugar up to four times a day. Dx Type 2 diabetes. 100 Units 3   atorvastatin (LIPITOR) 10 MG tablet Take 1 tablet (10 mg total) by mouth daily. Pt needs to keep appt for refills 90 tablet 3   linaclotide (LINZESS) 145 MCG CAPS capsule Take 1 capsule (145 mcg total) daily before breakfast by mouth. (Patient not taking: Reported on 10/27/2017) 30 capsule 2   lubiprostone (AMITIZA) 24 MCG capsule Take 1 capsule (24 mcg total) by mouth 2 (two) times daily with a meal. (Patient not taking: Reported on 10/27/2017) 60 capsule 5   metFORMIN (GLUCOPHAGE) 500 MG tablet Take 1 tablet (500 mg total) by mouth daily with breakfast. 90 tablet 3   Multiple Vitamin (MULTIVITAMIN WITH MINERALS) TABS tablet Take 1 tablet daily by mouth.     Omega-3 Fatty Acids (FISH OIL PO) Take by mouth.     polyethylene glycol (MIRALAX) packet Take 17 g by mouth daily. 28 each 0   No current facility-administered medications for this visit.     REVIEW OF SYSTEMS:    10 Point review of Systems was done is negative except as noted above.  PHYSICAL EXAMINATION: ECOG PERFORMANCE STATUS: 0 - Asymptomatic  . Vitals:   03/22/18 1039  BP: (!) 159/75  Pulse: 70  Resp: 18  Temp: 98.4 F (36.9 C)  SpO2: 99%   Filed Weights   03/22/18 1039  Weight: 153 lb (69.4 kg)   .Body mass index is 23.61 kg/m.  GENERAL:alert, in no acute distress and comfortable SKIN: no acute rashes, no significant lesions EYES: conjunctiva are pink and non-injected, sclera anicteric OROPHARYNX: MMM, no exudates, no oropharyngeal erythema or ulceration NECK: supple, no JVD LYMPH:  no palpable lymphadenopathy in the cervical, axillary or inguinal  regions LUNGS: clear to auscultation b/l with normal respiratory effort HEART: regular rate & rhythm ABDOMEN:  normoactive bowel sounds , non tender, not distended. No hepatosplenomegaly.  Extremity: no pedal edema PSYCH: alert & oriented x 3 with fluent speech NEURO: no focal motor/sensory deficits  LABORATORY DATA:  I have reviewed the data as listed  . CBC Latest Ref Rng & Units 03/22/2018 12/21/2017 09/22/2017  WBC 4.0 - 10.3 K/uL 18.4(H) 15.5(H) 16.6(H)  Hemoglobin 13.0 - 17.1 g/dL 13.1 13.1 13.6  Hematocrit 38.4 -  49.9 % 39.2 39.1 40.5  Platelets 140 - 400 K/uL 146 146 180   . CBC    Component Value Date/Time   WBC 18.4 (H) 03/22/2018 0944   WBC 16.6 (H) 09/22/2017 1412   RBC 4.28 03/22/2018 0944   HGB 13.1 03/22/2018 0944   HGB 12.8 (L) 05/29/2017 1141   HCT 39.2 03/22/2018 0944   HCT 38.7 05/29/2017 1141   PLT 146 03/22/2018 0944   PLT 151 05/29/2017 1141   MCV 91.6 03/22/2018 0944   MCV 91.3 05/29/2017 1141   MCH 30.6 03/22/2018 0944   MCHC 33.4 03/22/2018 0944   RDW 13.9 03/22/2018 0944   RDW 13.8 05/29/2017 1141   LYMPHSABS 11.9 (H) 03/22/2018 0944   LYMPHSABS 8.8 (H) 05/29/2017 1141   MONOABS 0.9 03/22/2018 0944   MONOABS 0.7 05/29/2017 1141   EOSABS 0.1 03/22/2018 0944   EOSABS 0.1 05/29/2017 1141   BASOSABS 0.1 03/22/2018 0944   BASOSABS 0.0 05/29/2017 1141    . CMP Latest Ref Rng & Units 03/22/2018 12/21/2017 09/22/2017  Glucose 70 - 99 mg/dL 128(H) 132 138  BUN 8 - 23 mg/dL _0 Creatinine 0.61 - 1.24 mg/dL 1.01 1.01 0.95  Sodium 135 - 145 mmol/L 142 142 142  Potassium 3.5 - 5.1 mmol/L 4.7 4.9 4.2  Chloride 98 - 111 mmol/L 109 107 105  CO2 22 - 32 mmol/L _1 Calcium 8.9 - 10.3 mg/dL 9.7 9.6 10.0  Total Protein 6.5 - 8.1 g/dL 7.5 7.5 7.6  Total Bilirubin 0.3 - 1.2 mg/dL 0.7 0.9 0.8  Alkaline Phos 38 - 126 U/L 86 83 86  AST 15 - 41 U/L _2 ALT 0 - 44 U/L _3 06/08/17 Cytogenetics:   05/29/17 Peripheral blood flow  cytometry:    RADIOGRAPHIC STUDIES: I have personally reviewed the radiological images as listed and agreed with the findings in the report. No results found.  ASSESSMENT & PLAN:  77 y.o. male with  1. Chronic Lymphocytic Leukemia Rai 1  FISH negative for del17p, del11q, del13q  PLAN: -Discussed patient's most recent labs from 03/22/18, Lymphs abs increased from 9.7k three months ago, to 11.9k today. LDH normal at 160.  -Discussed the criteria for treatment including constitutional symptoms, fast doubling rate of lymphocytes, other blood count effects -No high risk genetic factors present, no constitutional symptoms, no palpable lymph nodes, no splenomegaly, no indications for treatment at this time -Will be happy to see the pt back in 6 months unless any new concerns develop before then    RTC with Dr kale in 6 months with Labs    All of the patients questions were answered with apparent satisfaction. The patient knows to call the clinic with any problems, questions or concerns.  The total time spent in the appt was 25 minutes and more than 50% was on counseling and direct patient cares.  Verbal consent has been given by the pt for a clinical observer, Mathis Fare, to be present.    Sullivan Lone MD MS AAHIVMS [facility] Hematology/Oncology Physician [facility]  (Office):       [phone] (Work cell):  [phone] (Fax):           [phone]  03/22/2018 11:45 AM  I, Baldwin [place], am acting as a scribe for Dr Irene Limbo.   .I have reviewed the above documentation for accuracy and completeness, and I agree with the above. [name] MD

## 2018-03-22 ENCOUNTER — Telehealth: Payer: Self-pay | Admitting: Hematology

## 2018-03-22 ENCOUNTER — Inpatient Hospital Stay: Payer: Medicare HMO | Attending: Hematology and Oncology | Admitting: Hematology

## 2018-03-22 ENCOUNTER — Inpatient Hospital Stay: Payer: Medicare HMO

## 2018-03-22 VITALS — BP 159/75 | HR 70 | Temp 98.4°F | Resp 18 | Ht 67.5 in | Wt 153.0 lb

## 2018-03-22 DIAGNOSIS — Z7984 Long term (current) use of oral hypoglycemic drugs: Secondary | ICD-10-CM | POA: Diagnosis not present

## 2018-03-22 DIAGNOSIS — C9190 Lymphoid leukemia, unspecified not having achieved remission: Secondary | ICD-10-CM

## 2018-03-22 DIAGNOSIS — I700 Atherosclerosis of aorta: Secondary | ICD-10-CM | POA: Diagnosis not present

## 2018-03-22 DIAGNOSIS — C9110 Chronic lymphocytic leukemia of B-cell type not having achieved remission: Secondary | ICD-10-CM

## 2018-03-22 DIAGNOSIS — K5900 Constipation, unspecified: Secondary | ICD-10-CM

## 2018-03-22 DIAGNOSIS — Z87891 Personal history of nicotine dependence: Secondary | ICD-10-CM | POA: Diagnosis not present

## 2018-03-22 DIAGNOSIS — I10 Essential (primary) hypertension: Secondary | ICD-10-CM | POA: Diagnosis not present

## 2018-03-22 DIAGNOSIS — E119 Type 2 diabetes mellitus without complications: Secondary | ICD-10-CM | POA: Diagnosis not present

## 2018-03-22 DIAGNOSIS — E785 Hyperlipidemia, unspecified: Secondary | ICD-10-CM

## 2018-03-22 DIAGNOSIS — Z809 Family history of malignant neoplasm, unspecified: Secondary | ICD-10-CM | POA: Diagnosis not present

## 2018-03-22 DIAGNOSIS — Z87442 Personal history of urinary calculi: Secondary | ICD-10-CM | POA: Diagnosis not present

## 2018-03-22 LAB — CBC WITH DIFFERENTIAL (CANCER CENTER ONLY)
BASOS PCT: 0 %
Basophils Absolute: 0.1 10*3/uL (ref 0.0–0.1)
EOS ABS: 0.1 10*3/uL (ref 0.0–0.5)
EOS PCT: 1 %
HEMATOCRIT: 39.2 % (ref 38.4–49.9)
Hemoglobin: 13.1 g/dL (ref 13.0–17.1)
Lymphocytes Relative: 64 %
Lymphs Abs: 11.9 10*3/uL — ABNORMAL HIGH (ref 0.9–3.3)
MCH: 30.6 pg (ref 27.2–33.4)
MCHC: 33.4 g/dL (ref 32.0–36.0)
MCV: 91.6 fL (ref 79.3–98.0)
MONOS PCT: 5 %
Monocytes Absolute: 0.9 10*3/uL (ref 0.1–0.9)
NEUTROS ABS: 5.5 10*3/uL (ref 1.5–6.5)
Neutrophils Relative %: 30 %
PLATELETS: 146 10*3/uL (ref 140–400)
RBC: 4.28 MIL/uL (ref 4.20–5.82)
RDW: 13.9 % (ref 11.0–14.6)
WBC: 18.4 10*3/uL — AB (ref 4.0–10.3)

## 2018-03-22 LAB — CMP (CANCER CENTER ONLY)
ALBUMIN: 4.5 g/dL (ref 3.5–5.0)
ALT: 22 U/L (ref 0–44)
ANION GAP: 7 (ref 5–15)
AST: 19 U/L (ref 15–41)
Alkaline Phosphatase: 86 U/L (ref 38–126)
BUN: 22 mg/dL (ref 8–23)
CHLORIDE: 109 mmol/L (ref 98–111)
CO2: 26 mmol/L (ref 22–32)
Calcium: 9.7 mg/dL (ref 8.9–10.3)
Creatinine: 1.01 mg/dL (ref 0.61–1.24)
GFR, Est AFR Am: >60 mL/min (ref >60)
GFR, Estimated: >60 mL/min (ref >60)
GLUCOSE: 128 mg/dL — AB (ref 70–99)
POTASSIUM: 4.7 mmol/L (ref 3.5–5.1)
SODIUM: 142 mmol/L (ref 135–145)
Total Bilirubin: 0.7 mg/dL (ref 0.3–1.2)
Total Protein: 7.5 g/dL (ref 6.5–8.1)

## 2018-03-22 LAB — LACTATE DEHYDROGENASE: LDH: 160 U/L (ref 98–192)

## 2018-03-22 NOTE — Telephone Encounter (Signed)
Gave patient avs and calendar of upcoming jan appts. °

## 2018-03-23 LAB — IGG, IGA, IGM
IGA: 113 mg/dL (ref 61–437)
IGM (IMMUNOGLOBULIN M), SRM: 78 mg/dL (ref 15–143)
IgG (Immunoglobin G), Serum: 982 mg/dL (ref 700–1600)

## 2018-04-26 ENCOUNTER — Ambulatory Visit (INDEPENDENT_AMBULATORY_CARE_PROVIDER_SITE_OTHER): Payer: Medicare HMO | Admitting: Osteopathic Medicine

## 2018-04-26 ENCOUNTER — Encounter: Payer: Self-pay | Admitting: Osteopathic Medicine

## 2018-04-26 VITALS — BP 123/63 | HR 81 | Temp 98.1°F | Wt 151.2 lb

## 2018-04-26 DIAGNOSIS — I10 Essential (primary) hypertension: Secondary | ICD-10-CM | POA: Diagnosis not present

## 2018-04-26 DIAGNOSIS — E119 Type 2 diabetes mellitus without complications: Secondary | ICD-10-CM

## 2018-04-26 DIAGNOSIS — Z0000 Encounter for general adult medical examination without abnormal findings: Secondary | ICD-10-CM | POA: Diagnosis not present

## 2018-04-26 DIAGNOSIS — C9190 Lymphoid leukemia, unspecified not having achieved remission: Secondary | ICD-10-CM | POA: Diagnosis not present

## 2018-04-26 DIAGNOSIS — Z7189 Other specified counseling: Secondary | ICD-10-CM

## 2018-04-26 DIAGNOSIS — Z13820 Encounter for screening for osteoporosis: Secondary | ICD-10-CM

## 2018-04-26 DIAGNOSIS — E785 Hyperlipidemia, unspecified: Secondary | ICD-10-CM

## 2018-04-26 DIAGNOSIS — C9110 Chronic lymphocytic leukemia of B-cell type not having achieved remission: Secondary | ICD-10-CM

## 2018-04-26 LAB — POCT GLYCOSYLATED HEMOGLOBIN (HGB A1C): HEMOGLOBIN A1C: 6.2 % — AB (ref 4.0–5.6)

## 2018-04-26 NOTE — Progress Notes (Signed)
Subjective:   Tony Banks is a 76 y.o. male who presents for Medicare Annual/Subsequent preventive examination.  Review of Systems:   GI: no longer on Linzess or Amitiza Fall (+) in past year  HTN: BP ok today, no CP/SOB CLL: following with HemOnc, doing well DM2: A1C's have been good  PHQ: no concerns for mood/depression  HLD: labs ast checked about 6 mos ago, looked ok     Results for orders placed or performed in visit on 04/26/18 (from the past 24 hour(s))  POCT HgB A1C     Status: Abnormal   Collection Time: 04/26/18  9:24 AM  Result Value Ref Range   Hemoglobin A1C 6.2 (A) 4.0 - 5.6 %   HbA1c POC (<> result, manual entry)     HbA1c, POC (prediabetic range)     HbA1c, POC (controlled diabetic range)            Objective:    Vitals: BP 123/63 (BP Location: Left Arm, Patient Position: Sitting, Cuff Size: Normal)   Pulse 81   Temp 98.1 F (36.7 C) (Oral)   Wt 151 lb 3.2 oz (68.6 kg)   BMI 23.33 kg/m   Body mass index is 23.33 kg/m.  Advanced Directives 06/22/2017 05/13/2014 05/02/2014 04/26/2014  Does Patient Have a Medical Advance Directive? No No No No  Would patient like information on creating a medical advance directive? No - Patient declined No - patient declined information Yes Higher education careers adviser given No - patient declined information    Tobacco Social History   Tobacco Use  Smoking Status Former Smoker  . Last attempt to quit: 07/31/1977  . Years since quitting: 40.7  Smokeless Tobacco Never Used  Tobacco Comment   quit 30 yrs ago     Counseling given: Not Answered Comment: quit 30 yrs ago   Clinical Intake:  Immunization History  Administered Date(s) Administered  . Influenza, High Dose Seasonal PF 06/09/2017  . Influenza-Unspecified 07/10/2015, 07/06/2016  . Pneumococcal Conjugate-13 04/26/2016  . Pneumococcal Polysaccharide-23 04/27/2014   Need records from [facility] re: previous shots                        Past  Medical History:  Diagnosis Date  . Aortic atherosclerosis ([place])   . CLL (chronic lymphocytic leukemia) ([place])   . Constipation    severe  . Diabetes mellitus without complication ([place])   . Diverticulosis   . HLD (hyperlipidemia)   . Hypertension   . Nephrolithiasis    Past Surgical History:  Procedure Laterality Date  . FINGER FRACTURE SURGERY Right    middle finger  . SHOULDER ARTHROSCOPY Bilateral   . VASECTOMY     Family History  Problem Relation Age of Onset  . Heart disease Mother   . Stroke Mother   . Cancer Father   . Stroke Maternal Grandfather   . Colon cancer Neg Hx   . Pancreatic cancer Neg Hx   . Rectal cancer Neg Hx   . Stomach cancer Neg Hx    Social History   Socioeconomic History  . Marital status: Married    Spouse name: Not on file  . Number of children: 0  . Years of education: Not on file  . Highest education level: Not on file  Occupational History  . Occupation: retired  Scientific laboratory technician  . Financial resource strain: Not on file  . Food insecurity:    Worry: Not on file  Inability: Not on file  . Transportation needs:    Medical: Not on file    Non-medical: Not on file  Tobacco Use  . Smoking status: Former Smoker    Last attempt to quit: 07/31/1977    Years since quitting: 40.7  . Smokeless tobacco: Never Used  . Tobacco comment: quit 30 yrs ago  Substance and Sexual Activity  . Alcohol use: No  . Drug use: No  . Sexual activity: Never    Birth control/protection: Abstinence  Lifestyle  . Physical activity:    Days per week: Not on file    Minutes per session: Not on file  . Stress: Not on file  Relationships  . Social connections:    Talks on phone: Not on file    Gets together: Not on file    Attends religious service: Not on file    Active member of club or organization: Not on file    Attends meetings of clubs or organizations: Not on file    Relationship status: Not on file  Other Topics Concern  . Not on file  Social  History Narrative  . Not on file    Outpatient Encounter Medications as of 04/26/2018  Medication Sig  . ACCU-CHEK AVIVA PLUS test strip USE TO CHECK BLOOD SUGAR UP TO 4 TIMES A DAY  . ACCU-CHEK SOFTCLIX LANCETS lancets Use as instructed  . AMBULATORY NON FORMULARY MEDICATION Accu-chek aviva plus test strips and accu-chek softclix lancets, use to check blood sugar up to four times a day. Dx Type 2 diabetes.  Marland Kitchen atorvastatin (LIPITOR) 10 MG tablet Take 1 tablet (10 mg total) by mouth daily. Pt needs to keep appt for refills  . metFORMIN (GLUCOPHAGE) 500 MG tablet Take 1 tablet (500 mg total) by mouth daily with breakfast.  . Multiple Vitamin (MULTIVITAMIN WITH MINERALS) TABS tablet Take 1 tablet daily by mouth.  . Omega-3 Fatty Acids (FISH OIL PO) Take by mouth.  . polyethylene glycol (MIRALAX) packet Take 17 g by mouth daily.  Marland Kitchen linaclotide (LINZESS) 145 MCG CAPS capsule Take 1 capsule (145 mcg total) daily before breakfast by mouth. (Patient not taking: Reported on 10/27/2017)  . lubiprostone (AMITIZA) 24 MCG capsule Take 1 capsule (24 mcg total) by mouth 2 (two) times daily with a meal. (Patient not taking: Reported on 10/27/2017)   No facility-administered encounter medications on file as of 04/26/2018.     Activities of Daily Living In your present state of health, do you have any difficulty performing the following activities: 04/26/2017  Hearing? N  Vision? N  Difficulty concentrating or making decisions? N  Walking or climbing stairs? N  Dressing or bathing? N  Doing errands, shopping? N  Some recent data might be hidden    Patient Care Team: Emeterio Reeve, DO as PCP - General (Osteopathic Medicine)   Assessment:   This is a routine wellness examination for Tony Banks.  Exercise Activities and Dietary recommendations    Goals   None     Fall Risk Fall Risk  04/26/2017 05/02/2014  Falls in the past year? No No   Is the patient's home free of loose throw rugs in walkways,  pet beds, electrical cords, etc?   yes      Grab bars in the bathroom? no      Handrails on the stairs?   yes      Adequate lighting?   yes  Timed Get Up and Go Performed: no concerns  Depression Screen PHQ 2/9  Scores 10/27/2017 04/26/2017 04/26/2016 05/02/2014  PHQ - 2 Score 0 0 0 0  PHQ- 9 Score 0 - - -    Cognitive Function     6CIT Screen 04/26/2017  What Year? 0 points  What month? 0 points  What time? 0 points  Count back from 20 0 points  Months in reverse 0 points  Repeat phrase 0 points  Total Score 0    Immunization History  Administered Date(s) Administered  . Influenza, High Dose Seasonal PF 06/09/2017  . Influenza-Unspecified 07/10/2015, 07/06/2016  . Pneumococcal Conjugate-13 04/26/2016  . Pneumococcal Polysaccharide-23 04/27/2014    Qualifies for Shingles Vaccine? Yes - see below   Screening Tests Health Maintenance  Topic Date Due  . OPHTHALMOLOGY EXAM  07/07/2016  . FOOT EXAM  04/26/2017  . HEMOGLOBIN A1C  04/26/2018  . INFLUENZA VACCINE  12/14/2018 (Originally 04/05/2018)  . TETANUS/TDAP  04/05/2025 (Originally 11/18/1959)  . URINE MICROALBUMIN  05/09/2018  . PNA vac Low Risk Adult  Completed   Cancer Screenings: Lung: Low Dose CT Chest recommended if Age 59-80 years, 30 pack-year currently smoking OR have quit w/in 15years. Patient does not qualify. Colorectal: not needed  Additional Screenings: Hepatitis C Screening: not needed    Exam:  BP 123/63 (BP Location: Left Arm, Patient Position: Sitting, Cuff Size: Normal)   Pulse 81   Temp 98.1 F (36.7 C) (Oral)   Wt 151 lb 3.2 oz (68.6 kg)   BMI 23.33 kg/m   Constitutional: VS see above. General Appearance: alert, well-developed, well-nourished, NAD  Eyes: Normal lids and conjunctive, non-icteric sclera  Ears, Nose, Mouth, Throat: MMM, Normal external inspection ears/nares/mouth/lips/gums. TM normal bilaterally. Pharynx/tonsils no erythema, no exudate. Nasal mucosa normal.   Neck: No  masses, trachea midline. No thyroid enlargement. No tenderness/mass appreciated. No lymphadenopathy  Respiratory: Normal respiratory effort. no wheeze, no rhonchi, no rales  Cardiovascular: S1/S2 normal, no murmur, no rub/gallop auscultated. RRR. No lower extremity edema. Pedal pulse II/IV bilaterally DP and PT. No carotid bruit or JVD. No abdominal aortic bruit.  Gastrointestinal: Nontender, no masses. No hepatomegaly, no splenomegaly. No hernia appreciated. Bowel sounds normal. Rectal exam deferred.   Musculoskeletal: Gait normal. No clubbing/cyanosis of digits.   Neurological: Normal balance/coordination. No tremor. No cranial nerve deficit on limited exam. Motor and sensation intact and symmetric. Cerebellar reflexes intact.   Skin: warm, dry, intact. No rash/ulcer. No concerning nevi or subq nodules on limited exam.    Psychiatric: Normal judgment/insight. Normal mood and affect. Oriented x3.         Plan:     The primary encounter diagnosis was Medicare annual wellness visit, subsequent. Diagnoses of Annual physical exam, Essential hypertension, CLL (chronic lymphocytic leukemia) ([place]), Type 2 diabetes mellitus without complication, without long-term current use of insulin ([place]), Hyperlipidemia, unspecified hyperlipidemia type, and Screening for osteoporosis were also pertinent to this visit.  Orders Placed This Encounter  Procedures  . DG Bone Density    Order Specific Question:   Reason for Exam (SYMPTOM  OR DIAGNOSIS REQUIRED)    Answer:   osteoporosis screening    Order Specific Question:   Preferred imaging location?    Answer:   [facility]  . POCT HgB A1C     I have personally reviewed and noted the following in the patient's chart:   . Medical and social history . Use of alcohol, tobacco or illicit drugs  . Current medications and supplements . Functional ability and status . Nutritional status . Physical activity .  Advanced directives . List of  other physicians . Hospitalizations, surgeries, and ER visits in previous 12 months . Vitals . Screenings to include cognitive, depression, and falls . Referrals and appointments  In addition, I have reviewed and discussed with patient certain preventive protocols, quality metrics, and best practice recommendations. A written personalized care plan for preventive services as well as general preventive health recommendations were provided to patient.   Patient Instructions  General Preventive Care  Most recent routine screening lipids/other labs: last cholesterol check was good, last A1C was good for sugars  Tobacco: don't! Alcohol: moderation is ok for most people, but use caution. Recreational/Illicit Drugs: don't!  Exercise: as tolerated to reduce risk of cardiovascular disease and diabetes  Mental health: if need for mental health care (medicines, counseling, other), or concerns about moods, please let me know!    Vaccines  Flu vaccine: recommended every fall (by Halloween!) Please get this done with high-dose flu shot at your pharmacy   Shingles vaccine: Shingrix recommended after age 12, I don't have this on file for you! We will contact your pharmacy to see if this was done there   Pneumonia vaccines: Prevnar and Pneumovax recommended after age 18, you're done with this series!   Tetanus booster: Tdap recommended every 10 years. I don't have this on file for you! We will contact your pharmacy to see if this was done there   Other . Bone Density Test: recommended for men at age 71 to test for thinning of the bones  . Advanced Directive: Living Will and/or Healthcare Power of Attorney recommended for everyone over age 13, regardless of age or health status - see attached information  . Cholesterol: recommended screening annually, you'll be due in 10/2018 . Diabetes: recommended monitoring every 6 months or sooner if needed             Emeterio Reeve,  DO  04/26/2018

## 2018-04-26 NOTE — Patient Instructions (Addendum)
General Preventive Care  Most recent routine screening lipids/other labs: last cholesterol check was good, last A1C was good for sugars  Tobacco: don't! Alcohol: moderation is ok for most people, but use caution. Recreational/Illicit Drugs: don't!  Exercise: as tolerated to reduce risk of cardiovascular disease and diabetes  Mental health: if need for mental health care (medicines, counseling, other), or concerns about moods, please let me know!    Vaccines  Flu vaccine: recommended every fall (by Halloween!) Please get this done with high-dose flu shot at your pharmacy   Shingles vaccine: Shingrix recommended after age 72, I don't have this on file for you! We will contact your pharmacy to see if this was done there   Pneumonia vaccines: Prevnar and Pneumovax recommended after age 25, you're done with this series!   Tetanus booster: Tdap recommended every 10 years. I don't have this on file for you! We will contact your pharmacy to see if this was done there   Other . Bone Density Test: recommended for men at age 52 to test for thinning of the bones  . Advanced Directive: Living Will and/or Healthcare Power of Attorney recommended for everyone over age 82, regardless of age or health status - see attached information  . Cholesterol: recommended screening annually, you'll be due in 10/2018 . Diabetes: recommended monitoring every 6 months or sooner if needed

## 2018-04-30 DIAGNOSIS — Z961 Presence of intraocular lens: Secondary | ICD-10-CM | POA: Diagnosis not present

## 2018-04-30 DIAGNOSIS — H04123 Dry eye syndrome of bilateral lacrimal glands: Secondary | ICD-10-CM | POA: Diagnosis not present

## 2018-04-30 DIAGNOSIS — E119 Type 2 diabetes mellitus without complications: Secondary | ICD-10-CM | POA: Diagnosis not present

## 2018-04-30 DIAGNOSIS — H5203 Hypermetropia, bilateral: Secondary | ICD-10-CM | POA: Diagnosis not present

## 2018-04-30 LAB — HM DIABETES EYE EXAM

## 2018-05-01 ENCOUNTER — Telehealth: Payer: Self-pay | Admitting: Osteopathic Medicine

## 2018-05-01 NOTE — Telephone Encounter (Signed)
As per pt, he is not interested in Shingles vaccine & declines order to be sent to pharmacy. Pt is ok with getting his tetanus vaccine updated at his next visit with provider.

## 2018-05-01 NOTE — Telephone Encounter (Signed)
OK sounds good.

## 2018-05-01 NOTE — Telephone Encounter (Signed)
Please call patient: We got the immunization records from his pharmacy.  They do not have anything on file for him for previous shingles vaccine.  If he would like to get this done, we can send a prescription to the pharmacy.  They also do not have anything on file for him for tetanus vaccine, if he would like to get this done, I think we can do this here.

## 2018-05-11 ENCOUNTER — Encounter: Payer: Self-pay | Admitting: Osteopathic Medicine

## 2018-09-19 NOTE — Progress Notes (Signed)
HEMATOLOGY/ONCOLOGY CONSULTATION NOTE  Date of Service: 09/20/2018  Patient Care Team: Emeterio Reeve, DO as PCP - General (Osteopathic Medicine)  CHIEF COMPLAINTS/PURPOSE OF CONSULTATION:  Chronic Lymphocytic Leukemia   HISTORY OF PRESENTING ILLNESS:   Tony Banks is a wonderful 78 y.o. male who has been previously followed by my colleague Dr. 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 He is accompanied today by his wife. The pt reports that he is doing well overall. Verbal consent has been given by the pt for a clinical observer, Mathis Fare, to be present.    The pt was diagnosed with CLL in October 2018, following a Peripheral blood flow cytometry and cytogenetics report. 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 The pt notes that his CLL was picked up off of routine labs and was not prompted by any associated symptoms.   The pt reports that he has not had any symptoms or concerns in the last 3 months he denies any constitutional symptoms or noticing any new lumps or bumps.    Most recent lab results (03/22/18) of CBC w/diff, CMP is as follows: all values are WNL except for WBC at 18.4k, Lymphs abs at 11.9k, Glucose at 128. LDH 03/22/18 is normal at 160 QIG 03/22/18 is pending  On review of systems, pt reports good energy levels, and denies fevers, chills, night sweats, unexpected weight loss, noticing any new lumps or bumps, abdominal pains, and any other symptoms.   Interval History:   Tony Banks returns today for management and evaluation of his CLL. The patient's last visit with [place] was on 03/22/18. The pt reports that he is doing well overall.   The pt reports that he hasn't developed any new concerns and adds that if anything, he feels better today than he has in a long time. The pt denies developing any constitutional symptoms, and denies any abdominal pains or noticing any new  lumps or bumps. He continues eating well and moving his bowels well.   The pt continues with light weight training and frequent bicycle riding several times a day.  Lab results today (09/20/18) of CBC w/diff and CMP is as follows: all values are WNL except for WBC at 20.2k, Lymphs abs at 12.9k, Monocytes at 2.2k, Glucose at 149, BUN at 24. 09/20/18 LDH is . Lab Results  Component Value Date   LDH 155 09/20/2018     On review of systems, pt reports good energy levels, eating well, moving his bowels well, staying active, and denies fevers, chills, night sweats, unexpected weight loss, noticing any new lumps or bumps, leg swelling, abdominal pains, and any other symptoms.    MEDICAL HISTORY:  Past Medical History:  Diagnosis Date  . Aortic atherosclerosis ([place])   . CLL (chronic lymphocytic leukemia) ([place])   . Constipation    severe  . Diabetes mellitus without complication ([place])   . Diverticulosis   . HLD (hyperlipidemia)   . Hypertension   . Nephrolithiasis     SURGICAL HISTORY: Past Surgical History:  Procedure Laterality Date  . FINGER FRACTURE SURGERY Right    middle finger  . SHOULDER ARTHROSCOPY Bilateral   . VASECTOMY      SOCIAL HISTORY: Social History   Socioeconomic History  . Marital status: Married    Spouse name: Not on file  . Number of children: 0  . Years of education: Not on file  . Highest education  level: Not on file  Occupational History  . Occupation: retired  Scientific laboratory technician  . Financial resource strain: Not on file  . Food insecurity:    Worry: Not on file    Inability: Not on file  . Transportation needs:    Medical: Not on file    Non-medical: Not on file  Tobacco Use  . Smoking status: Former Smoker    Last attempt to quit: 07/31/1977    Years since quitting: 41.1  . Smokeless tobacco: Never Used  . Tobacco comment: quit 30 yrs ago  Substance and Sexual Activity  . Alcohol use: No  . Drug use: No  . Sexual activity: Never    Birth  control/protection: Abstinence  Lifestyle  . Physical activity:    Days per week: Not on file    Minutes per session: Not on file  . Stress: Not on file  Relationships  . Social connections:    Talks on phone: Not on file    Gets together: Not on file    Attends religious service: Not on file    Active member of club or organization: Not on file    Attends meetings of clubs or organizations: Not on file    Relationship status: Not on file  . Intimate partner violence:    Fear of current or ex partner: Not on file    Emotionally abused: Not on file    Physically abused: Not on file    Forced sexual activity: Not on file  Other Topics Concern  . Not on file  Social History Narrative  . Not on file    FAMILY HISTORY: Family History  Problem Relation Age of Onset  . Heart disease Mother   . Stroke Mother   . Cancer Father   . Stroke Maternal Grandfather   . Colon cancer Neg Hx   . Pancreatic cancer Neg Hx   . Rectal cancer Neg Hx   . Stomach cancer Neg Hx     ALLERGIES:  is allergic to shellfish allergy.  MEDICATIONS:  Current Outpatient Medications  Medication Sig Dispense Refill  . ACCU-CHEK AVIVA PLUS test strip USE TO CHECK BLOOD SUGAR UP TO 4 TIMES A DAY 100 each 3  . ACCU-CHEK SOFTCLIX LANCETS lancets Use as instructed 100 each 12  . AMBULATORY NON FORMULARY MEDICATION Accu-chek aviva plus test strips and accu-chek softclix lancets, use to check blood sugar up to four times a day. Dx Type 2 diabetes. 100 Units 3  . atorvastatin (LIPITOR) 10 MG tablet Take 1 tablet (10 mg total) by mouth daily. Pt needs to keep appt for refills 90 tablet 3  . metFORMIN (GLUCOPHAGE) 500 MG tablet Take 1 tablet (500 mg total) by mouth daily with breakfast. 90 tablet 3  . Multiple Vitamin (MULTIVITAMIN WITH MINERALS) TABS tablet Take 1 tablet daily by mouth.    . Omega-3 Fatty Acids (FISH OIL PO) Take by mouth.    . polyethylene glycol (MIRALAX) packet Take 17 g by mouth daily. 28 each  0   No current facility-administered medications for this visit.     REVIEW OF SYSTEMS:    A 10+ POINT REVIEW OF SYSTEMS WAS OBTAINED including neurology, dermatology, psychiatry, cardiac, respiratory, lymph, extremities, GI, GU, Musculoskeletal, constitutional, breasts, reproductive, HEENT.  All pertinent positives are noted in the HPI.  All others are negative.   PHYSICAL EXAMINATION: ECOG PERFORMANCE STATUS: 0 - Asymptomatic  . Vitals:   09/20/18 0930  BP: (!) 141/67  Pulse: 76  Resp: 18  Temp: 97.9 F (36.6 C)  SpO2: 100%   Filed Weights   09/20/18 0930  Weight: 150 lb 9.6 oz (68.3 kg)   .Body mass index is 22.9 kg/m.  GENERAL:alert, in no acute distress and comfortable SKIN: no acute rashes, no significant lesions EYES: conjunctiva are pink and non-injected, sclera anicteric OROPHARYNX: MMM, no exudates, no oropharyngeal erythema or ulceration NECK: supple, no JVD LYMPH:  no palpable lymphadenopathy in the cervical, axillary or inguinal regions LUNGS: clear to auscultation b/l with normal respiratory effort HEART: regular rate & rhythm ABDOMEN:  normoactive bowel sounds , non tender, not distended. No palpable hepatosplenomegaly.  Extremity: no pedal edema PSYCH: alert & oriented x 3 with fluent speech NEURO: no focal motor/sensory deficits   LABORATORY DATA:  I have reviewed the data as listed  . CBC Latest Ref Rng & Units 09/20/2018 03/22/2018 12/21/2017  WBC 4.0 - 10.5 K/uL 20.2(H) 18.4(H) 15.5(H)  Hemoglobin 13.0 - 17.0 g/dL 13.1 13.1 13.1  Hematocrit 39.0 - 52.0 % 40.0 39.2 39.1  Platelets 150 - 400 K/uL 160 146 146   . CBC    Component Value Date/Time   WBC 20.2 (H) 09/20/2018 0823   RBC 4.33 09/20/2018 0823   HGB 13.1 09/20/2018 0823   HGB 13.1 03/22/2018 0944   HGB 12.8 (L) 05/29/2017 1141   HCT 40.0 09/20/2018 0823   HCT 38.7 05/29/2017 1141   PLT 160 09/20/2018 0823   PLT 146 03/22/2018 0944   PLT 151 05/29/2017 1141   MCV 92.4 09/20/2018  0823   MCV 91.3 05/29/2017 1141   MCH 30.3 09/20/2018 0823   MCHC 32.8 09/20/2018 0823   RDW 13.5 09/20/2018 0823   RDW 13.8 05/29/2017 1141   LYMPHSABS 12.9 (H) 09/20/2018 0823   LYMPHSABS 8.8 (H) 05/29/2017 1141   MONOABS 2.2 (H) 09/20/2018 0823   MONOABS 0.7 05/29/2017 1141   EOSABS 0.2 09/20/2018 0823   EOSABS 0.1 05/29/2017 1141   BASOSABS 0.1 09/20/2018 0823   BASOSABS 0.0 05/29/2017 1141    . CMP Latest Ref Rng & Units 09/20/2018 03/22/2018 12/21/2017  Glucose 70 - 99 mg/dL 149(H) 128(H) 132  BUN 8 - 23 mg/dL 24(H) 22 21  Creatinine 0.61 - 1.24 mg/dL 1.09 1.01 1.01  Sodium 135 - 145 mmol/L 142 142 142  Potassium 3.5 - 5.1 mmol/L 4.6 4.7 4.9  Chloride 98 - 111 mmol/L 108 109 107  CO2 22 - 32 mmol/L _0 Calcium 8.9 - 10.3 mg/dL 9.4 9.7 9.6  Total Protein 6.5 - 8.1 g/dL 7.6 7.5 7.5  Total Bilirubin 0.3 - 1.2 mg/dL 1.1 0.7 0.9  Alkaline Phos 38 - 126 U/L 88 86 83  AST 15 - 41 U/L _1 ALT 0 - 44 U/L _2 06/08/17 FISH CLL Prognostic Panel:    06/08/17 Cytogenetics:   05/29/17 Peripheral blood flow cytometry:    RADIOGRAPHIC STUDIES: I have personally reviewed the radiological images as listed and agreed with the findings in the report. No results found.  ASSESSMENT & PLAN:  78 y.o. male with  1. Chronic Lymphocytic Leukemia Rai 1  06/08/17 CLL FISH Prognostic panel negative for del17p, del11q, del13q, indicating intermediate risk  PLAN: -Discussed pt labwork today, 09/20/18; WBC slightly increased to 20.2k, Lymphs abs slight increased to 12.9k, no anemia, normal PLT. Chemistries stable. LDH pending.  -The pt shows no clinical or lab progression of his CLL at this time.  -No indication to  initiate treatment at this time.   -Discussed the criteria for treatment including constitutional symptoms, fast doubling rate of lymphocytes, other blood count effects -Will see the pt back in 6 months, sooner if any new concerns   RTC with Dr Irene Limbo in 6  months with labs    All of the patients questions were answered with apparent satisfaction. The patient knows to call the clinic with any problems, questions or concerns.  The total time spent in the appt was 20 minutes and more than 50% was on counseling and direct patient cares.     Sullivan Lone MD MS AAHIVMS [facility] Hematology/Oncology Physician [facility]  (Office):       [phone] (Work cell):  [phone] (Fax):           [phone]  09/20/2018 10:22 AM  I, Baldwin [place], am acting as a scribe for Dr. Sullivan Lone.   .I have reviewed the above documentation for accuracy and completeness, and I agree with the above. [name] MD

## 2018-09-20 ENCOUNTER — Inpatient Hospital Stay: Payer: Medicare HMO | Admitting: Hematology

## 2018-09-20 ENCOUNTER — Telehealth: Payer: Self-pay

## 2018-09-20 ENCOUNTER — Inpatient Hospital Stay: Payer: Medicare HMO | Attending: Hematology

## 2018-09-20 VITALS — BP 141/67 | HR 76 | Temp 97.9°F | Resp 18 | Ht 68.0 in | Wt 150.6 lb

## 2018-09-20 DIAGNOSIS — I10 Essential (primary) hypertension: Secondary | ICD-10-CM

## 2018-09-20 DIAGNOSIS — Z87442 Personal history of urinary calculi: Secondary | ICD-10-CM | POA: Insufficient documentation

## 2018-09-20 DIAGNOSIS — Z79899 Other long term (current) drug therapy: Secondary | ICD-10-CM

## 2018-09-20 DIAGNOSIS — I700 Atherosclerosis of aorta: Secondary | ICD-10-CM | POA: Insufficient documentation

## 2018-09-20 DIAGNOSIS — C9110 Chronic lymphocytic leukemia of B-cell type not having achieved remission: Secondary | ICD-10-CM

## 2018-09-20 DIAGNOSIS — E119 Type 2 diabetes mellitus without complications: Secondary | ICD-10-CM | POA: Insufficient documentation

## 2018-09-20 DIAGNOSIS — K5790 Diverticulosis of intestine, part unspecified, without perforation or abscess without bleeding: Secondary | ICD-10-CM | POA: Insufficient documentation

## 2018-09-20 DIAGNOSIS — E785 Hyperlipidemia, unspecified: Secondary | ICD-10-CM | POA: Diagnosis not present

## 2018-09-20 DIAGNOSIS — K5900 Constipation, unspecified: Secondary | ICD-10-CM | POA: Diagnosis not present

## 2018-09-20 DIAGNOSIS — Z809 Family history of malignant neoplasm, unspecified: Secondary | ICD-10-CM

## 2018-09-20 DIAGNOSIS — Z87891 Personal history of nicotine dependence: Secondary | ICD-10-CM | POA: Diagnosis not present

## 2018-09-20 LAB — CMP (CANCER CENTER ONLY)
ALT: 19 U/L (ref 0–44)
ANION GAP: 11 (ref 5–15)
AST: 19 U/L (ref 15–41)
Albumin: 4.4 g/dL (ref 3.5–5.0)
Alkaline Phosphatase: 88 U/L (ref 38–126)
BUN: 24 mg/dL — ABNORMAL HIGH (ref 8–23)
CHLORIDE: 108 mmol/L (ref 98–111)
CO2: 23 mmol/L (ref 22–32)
Calcium: 9.4 mg/dL (ref 8.9–10.3)
Creatinine: 1.09 mg/dL (ref 0.61–1.24)
Glucose, Bld: 149 mg/dL — ABNORMAL HIGH (ref 70–99)
POTASSIUM: 4.6 mmol/L (ref 3.5–5.1)
Sodium: 142 mmol/L (ref 135–145)
TOTAL PROTEIN: 7.6 g/dL (ref 6.5–8.1)
Total Bilirubin: 1.1 mg/dL (ref 0.3–1.2)

## 2018-09-20 LAB — CBC WITH DIFFERENTIAL/PLATELET
ABS IMMATURE GRANULOCYTES: 0.04 10*3/uL (ref 0.00–0.07)
BASOS ABS: 0.1 10*3/uL (ref 0.0–0.1)
BASOS PCT: 0 %
Eosinophils Absolute: 0.2 10*3/uL (ref 0.0–0.5)
Eosinophils Relative: 1 %
HCT: 40 % (ref 39.0–52.0)
Hemoglobin: 13.1 g/dL (ref 13.0–17.0)
Immature Granulocytes: 0 %
LYMPHS PCT: 64 %
Lymphs Abs: 12.9 10*3/uL — ABNORMAL HIGH (ref 0.7–4.0)
MCH: 30.3 pg (ref 26.0–34.0)
MCHC: 32.8 g/dL (ref 30.0–36.0)
MCV: 92.4 fL (ref 80.0–100.0)
Monocytes Absolute: 2.2 10*3/uL — ABNORMAL HIGH (ref 0.1–1.0)
Monocytes Relative: 11 %
NEUTROS ABS: 4.8 10*3/uL (ref 1.7–7.7)
NEUTROS PCT: 24 %
NRBC: 0 % (ref 0.0–0.2)
Platelets: 160 10*3/uL (ref 150–400)
RBC: 4.33 MIL/uL (ref 4.22–5.81)
RDW: 13.5 % (ref 11.5–15.5)
WBC: 20.2 10*3/uL — AB (ref 4.0–10.5)

## 2018-09-20 LAB — LACTATE DEHYDROGENASE: LDH: 155 U/L (ref 98–192)

## 2018-09-20 NOTE — Telephone Encounter (Signed)
Printed avs and calender of upcoming appointment. Per 1/16 los 

## 2018-10-29 ENCOUNTER — Ambulatory Visit (INDEPENDENT_AMBULATORY_CARE_PROVIDER_SITE_OTHER): Payer: Medicare HMO | Admitting: Osteopathic Medicine

## 2018-10-29 ENCOUNTER — Encounter: Payer: Self-pay | Admitting: Osteopathic Medicine

## 2018-10-29 VITALS — BP 160/76 | HR 80 | Temp 98.2°F | Wt 153.0 lb

## 2018-10-29 DIAGNOSIS — E1159 Type 2 diabetes mellitus with other circulatory complications: Secondary | ICD-10-CM | POA: Diagnosis not present

## 2018-10-29 DIAGNOSIS — I10 Essential (primary) hypertension: Secondary | ICD-10-CM | POA: Diagnosis not present

## 2018-10-29 DIAGNOSIS — E119 Type 2 diabetes mellitus without complications: Secondary | ICD-10-CM

## 2018-10-29 LAB — POCT GLYCOSYLATED HEMOGLOBIN (HGB A1C): Hemoglobin A1C: 6.9 % — AB (ref 4.0–5.6)

## 2018-10-29 MED ORDER — ENALAPRIL MALEATE 2.5 MG PO TABS: 2.5000 mg | ORAL_TABLET | Freq: Every day | ORAL | 1 refills | Status: DC

## 2018-10-29 MED ORDER — METFORMIN HCL 500 MG PO TABS: 1000.0000 mg | ORAL_TABLET | Freq: Every day | ORAL | 3 refills | Status: DC

## 2018-10-29 NOTE — Progress Notes (Signed)
HPI: Tony Banks is a 78 y.o. male who  has a past medical history of Aortic atherosclerosis ([place]), CLL (chronic lymphocytic leukemia) ([place]), Constipation, Diabetes mellitus without complication ([place]), Diverticulosis, HLD (hyperlipidemia), Hypertension, and Nephrolithiasis.  he presents to [facility] today, 10/29/18,  for chief complaint of:  Diabetes follow-up   DM2:  Last A1C check was 04/2018 at 6.2% Today: 6.9%  Reports increased appetite - lots of cookies! "Got on the christmas diet and never got off of it." Can't blame him, sounds delicious.  Taking Metformin 500 mg daily, was on 1000 daily in past and did fine with this.  No ACE/ARB Foot exam ok Unable to pee today for microalbumin.   BP:  elevated today 160/76, was at goal last visit here, looks to be creeping up a bit lately.    Wt Readings from Last 3 Encounters:  10/29/18 153 lb (69.4 kg)  09/20/18 150 lb 9.6 oz (68.3 kg)  04/26/18 151 lb 3.2 oz (68.6 kg)   BP Readings from Last 3 Encounters:  10/29/18 (!) 160/76  09/20/18 (!) 141/67  04/26/18 123/63        At today's visit 10/29/18 ... PMH, PSH, FH reviewed and updated as needed.  Current medication list and allergy/intolerance hx reviewed and updated as needed. (See remainder of HPI, ROS, Phys Exam below)      Results for orders placed or performed in visit on 10/29/18 (from the past 72 hour(s))  POCT HgB A1C     Status: Abnormal   Collection Time: 10/29/18  9:46 AM  Result Value Ref Range   Hemoglobin A1C 6.9 (A) 4.0 - 5.6 %   HbA1c POC (<> result, manual entry)     HbA1c, POC (prediabetic range)     HbA1c, POC (controlled diabetic range)            ASSESSMENT/PLAN: The primary encounter diagnosis was Type 2 diabetes mellitus without complication, without long-term current use of insulin ([place]). Diagnoses of Essential hypertension and Hypertension associated with diabetes [facility]) were also pertinent to this  visit.   Orders Placed This Encounter  Procedures  . BASIC METABOLIC PANEL WITH GFR  . POCT HgB A1C     Meds ordered this encounter  Medications  . metFORMIN (GLUCOPHAGE) 500 MG tablet    Sig: Take 2 tablets (1,000 mg total) by mouth daily with breakfast.    Dispense:  180 tablet    Refill:  3  . enalapril (VASOTEC) 2.5 MG tablet    Sig: Take 1 tablet (2.5 mg total) by mouth daily.    Dispense:  30 tablet    Refill:  1    Patient Instructions  Plan:  Diabetes: Increase Metformin form 500 mg daily to 1000 mg daily Watch diet Exercise Recheck A1C in office in 3 months (01/27/2019)  Blood pressure: Will add a low dose of new Rx: enalapril  In 1 week: nurse visit here to make sure BP looking better (<140/90) In 6 weeks: lab visit (around 12/10/2018) - doesn't have to be fasting         Follow-up plan: Return in about 1 week (around 11/05/2018) for nures visit recheck blood pressure on new medication .                                                 ################################################# ################################################# ################################################# #################################################  No outpatient medications have been marked as taking for the 10/29/18 encounter (Office Visit) with Emeterio Reeve, DO.    Allergies  Allergen Reactions  . Shellfish Allergy Other (See Comments)    Leg swelling        Review of Systems:  Constitutional: No recent illness, no fever/chilss  HEENT: No  headache  Cardiac: No  chest pain, No  pressure, No palpitations  Respiratory:  No  shortness of breath. No  Cough  Gastrointestinal: No  abdominal pain  Neurologic: No  weakness, No  Dizziness  Psychiatric: No  concerns with depression, No  concerns with anxiety  Exam:  BP (!) 160/76   Pulse 80   Temp 98.2 F (36.8 C) (Oral)   Wt 153 lb (69.4 kg)   BMI  23.26 kg/m   Constitutional: VS see above. General Appearance: alert, well-developed, well-nourished, NAD  Eyes: Normal lids and conjunctive, non-icteric sclera  Ears, Nose, Mouth, Throat: MMM, Normal external inspection ears/nares/mouth/lips/gums.  Neck: No masses, trachea midline.   Respiratory: Normal respiratory effort. no wheeze, no rhonchi, no rales  Cardiovascular: S1/S2 normal, no murmur, no rub/gallop auscultated. RRR.   Musculoskeletal: Gait normal. Symmetric and independent movement of all extremities  Neurological: Normal balance/coordination. No tremor.  Skin: warm, dry, intact.   Psychiatric: Normal judgment/insight. Normal mood and affect. Oriented x3.       Visit summary with medication list and pertinent instructions was printed for patient to review, patient was advised to alert [place] if any updates are needed. All questions at time of visit were answered - patient instructed to contact office with any additional concerns. ER/RTC precautions were reviewed with the patient and understanding verbalized.    Please note: voice recognition software was used to produce this document, and typos may escape review. Please contact Dr. Sheppard Coil for any needed clarifications.    Follow up plan: Return in about 1 week (around 11/05/2018) for nures visit recheck blood pressure on new medication .

## 2018-10-29 NOTE — Patient Instructions (Addendum)
Plan:  Diabetes: Increase Metformin form 500 mg daily to 1000 mg daily Watch diet Exercise Recheck A1C in office in 3 months (01/27/2019)  Blood pressure: Will add a low dose of new Rx: enalapril  In 1 week: nurse visit here to make sure BP looking better (<140/90) In 6 weeks: lab visit (around 12/10/2018) - doesn't have to be fasting

## 2018-11-05 ENCOUNTER — Ambulatory Visit (INDEPENDENT_AMBULATORY_CARE_PROVIDER_SITE_OTHER): Payer: Medicare HMO | Admitting: Osteopathic Medicine

## 2018-11-05 VITALS — BP 153/70 | HR 80 | Resp 16 | Wt 153.0 lb

## 2018-11-05 DIAGNOSIS — I10 Essential (primary) hypertension: Secondary | ICD-10-CM

## 2018-11-05 NOTE — Progress Notes (Signed)
Established Patient Office Visit  Subjective:  Patient ID: Tony Banks, male    DOB: [birth year]  Age: 78 y.o. MRN: [id number]  CC: No chief complaint on file.   HPI Tony Banks presents for blood pressure check. He did start the Enalapril. Denies chest pain, shortness of breath or dizziness.   Past Medical History:  Diagnosis Date  . Aortic atherosclerosis ([place])   . CLL (chronic lymphocytic leukemia) ([place])   . Constipation    severe  . Diabetes mellitus without complication ([place])   . Diverticulosis   . HLD (hyperlipidemia)   . Hypertension   . Nephrolithiasis     Past Surgical History:  Procedure Laterality Date  . FINGER FRACTURE SURGERY Right    middle finger  . SHOULDER ARTHROSCOPY Bilateral   . VASECTOMY      Family History  Problem Relation Age of Onset  . Heart disease Mother   . Stroke Mother   . Cancer Father   . Stroke Maternal Grandfather   . Colon cancer Neg Hx   . Pancreatic cancer Neg Hx   . Rectal cancer Neg Hx   . Stomach cancer Neg Hx     Social History   Socioeconomic History  . Marital status: Married    Spouse name: Not on file  . Number of children: 0  . Years of education: Not on file  . Highest education level: Not on file  Occupational History  . Occupation: retired  Scientific laboratory technician  . Financial resource strain: Not on file  . Food insecurity:    Worry: Not on file    Inability: Not on file  . Transportation needs:    Medical: Not on file    Non-medical: Not on file  Tobacco Use  . Smoking status: Former Smoker    Last attempt to quit: 07/31/1977    Years since quitting: 41.2  . Smokeless tobacco: Never Used  . Tobacco comment: quit 30 yrs ago  Substance and Sexual Activity  . Alcohol use: No  . Drug use: No  . Sexual activity: Never    Birth control/protection: Abstinence  Lifestyle  . Physical activity:    Days per week: Not on file    Minutes per session: Not on file  . Stress: Not on file  Relationships  .  Social connections:    Talks on phone: Not on file    Gets together: Not on file    Attends religious service: Not on file    Active member of club or organization: Not on file    Attends meetings of clubs or organizations: Not on file    Relationship status: Not on file  . Intimate partner violence:    Fear of current or ex partner: Not on file    Emotionally abused: Not on file    Physically abused: Not on file    Forced sexual activity: Not on file  Other Topics Concern  . Not on file  Social History Narrative  . Not on file    Outpatient Medications Prior to Visit  Medication Sig Dispense Refill  . ACCU-CHEK AVIVA PLUS test strip USE TO CHECK BLOOD SUGAR UP TO 4 TIMES A DAY 100 each 3  . ACCU-CHEK SOFTCLIX LANCETS lancets Use as instructed 100 each 12  . AMBULATORY NON FORMULARY MEDICATION Accu-chek aviva plus test strips and accu-chek softclix lancets, use to check blood sugar up to four times a day. Dx Type 2 diabetes. 100 Units 3  .  atorvastatin (LIPITOR) 10 MG tablet Take 1 tablet (10 mg total) by mouth daily. Pt needs to keep appt for refills 90 tablet 3  . enalapril (VASOTEC) 2.5 MG tablet Take 1 tablet (2.5 mg total) by mouth daily. 30 tablet 1  . metFORMIN (GLUCOPHAGE) 500 MG tablet Take 2 tablets (1,000 mg total) by mouth daily with breakfast. 180 tablet 3  . Multiple Vitamin (MULTIVITAMIN WITH MINERALS) TABS tablet Take 1 tablet daily by mouth.    . Omega-3 Fatty Acids (FISH OIL PO) Take by mouth.    . polyethylene glycol (MIRALAX) packet Take 17 g by mouth daily. 28 each 0   No facility-administered medications prior to visit.     Allergies  Allergen Reactions  . Shellfish Allergy Other (See Comments)    Leg swelling     ROS Review of Systems    Objective:    Physical Exam  There were no vitals taken for this visit. Wt Readings from Last 3 Encounters:  10/29/18 153 lb (69.4 kg)  09/20/18 150 lb 9.6 oz (68.3 kg)  04/26/18 151 lb 3.2 oz (68.6 kg)      There are no preventive care reminders to display for this patient.  There are no preventive care reminders to display for this patient.  Lab Results  Component Value Date   TSH 1.42 10/27/2017   Lab Results  Component Value Date   WBC 20.2 (H) 09/20/2018   HGB 13.1 09/20/2018   HCT 40.0 09/20/2018   MCV 92.4 09/20/2018   PLT 160 09/20/2018   Lab Results  Component Value Date   NA 142 09/20/2018   K 4.6 09/20/2018   CHLORIDE 110 (H) 05/29/2017   CO2 23 09/20/2018   GLUCOSE 149 (H) 09/20/2018   BUN 24 (H) 09/20/2018   CREATININE 1.09 09/20/2018   BILITOT 1.1 09/20/2018   ALKPHOS 88 09/20/2018   AST 19 09/20/2018   ALT 19 09/20/2018   PROT 7.6 09/20/2018   ALBUMIN 4.4 09/20/2018   CALCIUM 9.4 09/20/2018   ANIONGAP 11 09/20/2018   EGFR 84 (L) 05/29/2017   Lab Results  Component Value Date   CHOL 122 10/27/2017   Lab Results  Component Value Date   HDL 38 (L) 10/27/2017   Lab Results  Component Value Date   LDLCALC 68 10/27/2017   Lab Results  Component Value Date   TRIG 75 10/27/2017   Lab Results  Component Value Date   CHOLHDL 3.2 10/27/2017   Lab Results  Component Value Date   HGBA1C 6.9 (A) 10/29/2018      Assessment & Plan:  Hypertension - blood pressure on second check was within normal limits. However, it is on the upper normal end. Patient advised to continue current medications as directed and follow up in 2 months.     Problem List Items Addressed This Visit    None      No orders of the defined types were placed in this encounter.   Follow-up: No follow-ups on file.    Lavell Luster, Francis

## 2018-11-06 ENCOUNTER — Ambulatory Visit: Payer: Medicare HMO | Admitting: Osteopathic Medicine

## 2018-11-11 ENCOUNTER — Other Ambulatory Visit: Payer: Self-pay | Admitting: Osteopathic Medicine

## 2018-11-11 DIAGNOSIS — E119 Type 2 diabetes mellitus without complications: Secondary | ICD-10-CM

## 2018-11-13 ENCOUNTER — Other Ambulatory Visit: Payer: Self-pay | Admitting: Osteopathic Medicine

## 2018-11-13 DIAGNOSIS — E119 Type 2 diabetes mellitus without complications: Secondary | ICD-10-CM

## 2018-11-20 ENCOUNTER — Other Ambulatory Visit: Payer: Self-pay | Admitting: Osteopathic Medicine

## 2018-11-20 NOTE — Telephone Encounter (Signed)
Please review for refill- patient at [facility] 

## 2018-12-07 ENCOUNTER — Other Ambulatory Visit: Payer: Self-pay | Admitting: Osteopathic Medicine

## 2018-12-07 DIAGNOSIS — E119 Type 2 diabetes mellitus without complications: Secondary | ICD-10-CM

## 2018-12-11 DIAGNOSIS — E119 Type 2 diabetes mellitus without complications: Secondary | ICD-10-CM | POA: Diagnosis not present

## 2018-12-12 LAB — BASIC METABOLIC PANEL WITH GFR
BUN: 24 mg/dL (ref 7–25)
CO2: 27 mmol/L (ref 20–32)
CREATININE: 1.03 mg/dL (ref 0.70–1.18)
Calcium: 9.4 mg/dL (ref 8.6–10.3)
Chloride: 106 mmol/L (ref 98–110)
GFR, Est African American: 81 mL/min/{1.73_m2} (ref >=60)
GFR, Est Non African American: 70 mL/min/{1.73_m2} (ref >=60)
GLUCOSE: 148 mg/dL — AB (ref 65–99)
Potassium: 4.4 mmol/L (ref 3.5–5.3)
Sodium: 142 mmol/L (ref 135–146)

## 2018-12-16 ENCOUNTER — Other Ambulatory Visit: Payer: Self-pay | Admitting: Osteopathic Medicine

## 2018-12-29 ENCOUNTER — Other Ambulatory Visit: Payer: Self-pay | Admitting: Osteopathic Medicine

## 2018-12-31 ENCOUNTER — Other Ambulatory Visit: Payer: Self-pay | Admitting: Osteopathic Medicine

## 2018-12-31 DIAGNOSIS — E119 Type 2 diabetes mellitus without complications: Secondary | ICD-10-CM

## 2019-01-09 ENCOUNTER — Other Ambulatory Visit: Payer: Self-pay | Admitting: Osteopathic Medicine

## 2019-01-10 ENCOUNTER — Other Ambulatory Visit: Payer: Self-pay | Admitting: Osteopathic Medicine

## 2019-01-10 DIAGNOSIS — E119 Type 2 diabetes mellitus without complications: Secondary | ICD-10-CM

## 2019-01-29 ENCOUNTER — Ambulatory Visit (INDEPENDENT_AMBULATORY_CARE_PROVIDER_SITE_OTHER): Payer: Medicare HMO | Admitting: Osteopathic Medicine

## 2019-01-29 ENCOUNTER — Other Ambulatory Visit: Payer: Self-pay

## 2019-01-29 ENCOUNTER — Encounter: Payer: Self-pay | Admitting: Osteopathic Medicine

## 2019-01-29 VITALS — BP 140/62 | HR 72 | Temp 97.9°F | Wt 151.3 lb

## 2019-01-29 DIAGNOSIS — E119 Type 2 diabetes mellitus without complications: Secondary | ICD-10-CM | POA: Diagnosis not present

## 2019-01-29 LAB — POCT GLYCOSYLATED HEMOGLOBIN (HGB A1C): Hemoglobin A1C: 6.8 % — AB (ref 4.0–5.6)

## 2019-01-29 MED ORDER — GLUCOSE BLOOD VI STRP: 1.0000 | ORAL_STRIP | 99 refills | Status: DC | PRN

## 2019-01-29 NOTE — Progress Notes (Signed)
HPI: Tony Banks is a 78 y.o. male who  has a past medical history of Aortic atherosclerosis ([place]), CLL (chronic lymphocytic leukemia) ([place]), Constipation, Diabetes mellitus without complication ([place]), Diverticulosis, HLD (hyperlipidemia), Hypertension, and Nephrolithiasis.  he presents to [facility] today, 01/29/19,  for chief complaint of:  DM2 and HTN follow-up    DM2:  A1C  04/2018: 6.2% 10/2018: 6.9% Reports increased appetite - lots of cookies! "Got on the christmas diet and never got off of it." Couldn't blame him, sounds delicious. Was taking Metformin 500 mg daily, was on 1000 daily in past and did fine with this.  Today: 01/29/19: 6.8% doing well all things considered with the coronavirus pandemic, his activity has been a bit decreased, diet has not been great!   No ACE/ARB Foot exam ok Unable to pee today for microalbumin.   BP:  No CP/SOB, no HA/VC  BP Readings from Last 3 Encounters:  01/29/19 140/62  11/05/18 (!) 153/70  10/29/18 (!) 160/76        At today's visit 01/29/19 ... PMH, PSH, FH reviewed and updated as needed.  Current medication list and allergy/intolerance hx reviewed and updated as needed. (See remainder of HPI, ROS, Phys Exam below)   No results found.  Results for orders placed or performed in visit on 01/29/19 (from the past 72 hour(s))  POCT HgB A1C     Status: Abnormal   Collection Time: 01/29/19 11:01 AM  Result Value Ref Range   Hemoglobin A1C 6.8 (A) 4.0 - 5.6 %   HbA1c POC (<> result, manual entry)     HbA1c, POC (prediabetic range)     HbA1c, POC (controlled diabetic range)            ASSESSMENT/PLAN: The encounter diagnosis was Type 2 diabetes mellitus without complication, without long-term current use of insulin ([place]).   Orders Placed This Encounter  Procedures  . POCT HgB A1C     Meds ordered this encounter  Medications  . glucose blood (ACCU-CHEK AVIVA PLUS) test strip     Sig: 1 each by Other route as needed for other (daily or up to 4 times per day as directed). Use as instructed    Dispense:  200 each    Refill:  99    There are no Patient Instructions on file for this visit.    Follow-up plan: Return in about 4 months (around 06/01/2019) for A1C recheck, sooner if needed! .                                                 ################################################# ################################################# ################################################# #################################################    Current Meds  Medication Sig  . ACCU-CHEK SOFTCLIX LANCETS lancets Use as instructed  . AMBULATORY NON FORMULARY MEDICATION Accu-chek aviva plus test strips and accu-chek softclix lancets, use to check blood sugar up to four times a day. Dx Type 2 diabetes.  Marland Kitchen atorvastatin (LIPITOR) 10 MG tablet TAKE 1 TABLET (10 MG TOTAL) BY MOUTH DAILY. DUE FOR LAB WORK  . enalapril (VASOTEC) 2.5 MG tablet TAKE 1 TABLET BY MOUTH EVERY DAY  . glucose blood (ACCU-CHEK AVIVA PLUS) test strip 1 each by Other route as needed for other (daily or up to 4 times per day as directed). Use as instructed  . metFORMIN (GLUCOPHAGE) 500 MG tablet Take 2 tablets (1,000  mg total) by mouth daily with breakfast.  . Multiple Vitamin (MULTIVITAMIN WITH MINERALS) TABS tablet Take 1 tablet daily by mouth.  . Omega-3 Fatty Acids (FISH OIL PO) Take by mouth.  . polyethylene glycol (MIRALAX) packet Take 17 g by mouth daily.  . [DISCONTINUED] ACCU-CHEK AVIVA PLUS test strip USE TO CHECK BLOOD SUGAR UP TO 4 TIMES A DAY    Allergies  Allergen Reactions  . Shellfish Allergy Other (See Comments)    Leg swelling        Review of Systems:  Constitutional: No recent illness  HEENT: No  headache, no vision change  Cardiac: No  chest pain, No  pressure, No palpitations  Respiratory:  No  shortness of breath. No   Cough  Gastrointestinal: No  abdominal pain  Musculoskeletal: No new myalgia/arthralgia  Neurologic: No  weakness, No  Dizziness  Psychiatric: No  concerns with depression, No  concerns with anxiety  Exam:  BP 140/62 (BP Location: Left Arm, Patient Position: Sitting, Cuff Size: Normal)   Pulse 72   Temp 97.9 F (36.6 C) (Oral)   Wt 151 lb 4.8 oz (68.6 kg)   BMI 23.01 kg/m   Constitutional: VS see above. General Appearance: alert, well-developed, well-nourished, NAD  Eyes: Normal lids and conjunctive, non-icteric sclera  Ears, Nose, Mouth, Throat: MMM, Normal external inspection ears/nares/mouth/lips/gums.  Neck: No masses, trachea midline.   Respiratory: Normal respiratory effort. no wheeze, no rhonchi, no rales  Cardiovascular: S1/S2 normal, no murmur, no rub/gallop auscultated. RRR.   Musculoskeletal: Gait normal. Symmetric and independent movement of all extremities  Neurological: Normal balance/coordination. No tremor.  Skin: warm, dry, intact.   Psychiatric: Normal judgment/insight. Normal mood and affect. Oriented x3.       Visit summary with medication list and pertinent instructions was printed for patient to review, patient was advised to alert [place] if any updates are needed. All questions at time of visit were answered - patient instructed to contact office with any additional concerns. ER/RTC precautions were reviewed with the patient and understanding verbalized.     Please note: voice recognition software was used to produce this document, and typos may escape review. Please contact Dr. Sheppard Coil for any needed clarifications.    Follow up plan: Return in about 4 months (around 06/01/2019) for A1C recheck, sooner if needed! Marland Kitchen

## 2019-01-31 ENCOUNTER — Other Ambulatory Visit: Payer: Self-pay | Admitting: Osteopathic Medicine

## 2019-02-21 ENCOUNTER — Other Ambulatory Visit: Payer: Self-pay | Admitting: Osteopathic Medicine

## 2019-02-21 DIAGNOSIS — E119 Type 2 diabetes mellitus without complications: Secondary | ICD-10-CM

## 2019-03-21 ENCOUNTER — Other Ambulatory Visit: Payer: Medicare HMO

## 2019-03-21 ENCOUNTER — Ambulatory Visit: Payer: Medicare HMO | Admitting: Hematology

## 2019-04-24 ENCOUNTER — Ambulatory Visit: Payer: Medicare HMO | Admitting: Hematology

## 2019-04-24 ENCOUNTER — Other Ambulatory Visit: Payer: Medicare HMO

## 2019-04-28 ENCOUNTER — Other Ambulatory Visit: Payer: Self-pay | Admitting: Osteopathic Medicine

## 2019-04-30 NOTE — Progress Notes (Signed)
HEMATOLOGY/ONCOLOGY CONSULTATION NOTE  Date of Service: 04/30/2019  Patient Care Team: Emeterio Reeve, DO as PCP - General (Osteopathic Medicine)  CHIEF COMPLAINTS/PURPOSE OF CONSULTATION:  Chronic Lymphocytic Leukemia   HISTORY OF PRESENTING ILLNESS:   Tony Banks is a wonderful 78 y.o. male who has been previously followed by my colleague Dr. 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 He is accompanied today by his wife. The pt reports that he is doing well overall. Verbal consent has been given by the pt for a clinical observer, Mathis Fare, to be present.    The pt was diagnosed with CLL in October 2018, following a Peripheral blood flow cytometry and cytogenetics report. 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 The pt notes that his CLL was picked up off of routine labs and was not prompted by any associated symptoms.   The pt reports that he has not had any symptoms or concerns in the last 3 months he denies any constitutional symptoms or noticing any new lumps or bumps.    Most recent lab results (03/22/18) of CBC w/diff, CMP is as follows: all values are WNL except for WBC at 18.4k, Lymphs abs at 11.9k, Glucose at 128. LDH 03/22/18 is normal at 160 QIG 03/22/18 is pending  On review of systems, pt reports good energy levels, and denies fevers, chills, night sweats, unexpected weight loss, noticing any new lumps or bumps, abdominal pains, and any other symptoms.   Interval History:   Tony Banks returns today for management and evaluation of his CLL. The patient's last visit with [place] was on 09/20/2018. The pt reports that she is doing well overall.  The pt reports that he has been staying at home and avoiding crowds. Over the past 6 months, he reports low energy levels, but he thinks it is because he has been staying at home for so long. He notes that he used to mow his  [id number] yard without taking a break, but now he has to stop in the middle. His weight is stable and he denies fevers, chills, night sweats, and new lumps or bumps.  Lab results today (05/02/2019) of CBC w/diff and CMP is as follows: all values are WNL except for WBC at 27.1k, RBC at 4.07, HGB at 12.3, HCT at 37.6, lymphs abs at 18.5k, monocytes abs at 2.3k, abs immature granulocytes at 0.08k, smudge cells present, glucose at 156. 05/02/2019 LDH is . Lab Results  Component Value Date   LDH 132 05/02/2019   On review of systems, pt reports low energy levels, and denies weight changes, fevers, chills, night sweats, back pain, new lumps or bumps, and any other symptoms.   MEDICAL HISTORY:  Past Medical History:  Diagnosis Date  . [place] ([place]   . [place]) [place]   . Constipation    severe  . Diabetes mellitus without complication ([place]   . Diverticulosis   . HLD (hyperlipidemia)   . Hypertension   . Nephrolithiasis     SURGICAL HISTORY: Past Surgical History:  Procedure Laterality Date  . FINGER FRACTURE SURGERY Right    middle finger  . SHOULDER ARTHROSCOPY Bilateral   . VASECTOMY      SOCIAL HISTORY: Social History   Socioeconomic History  . Marital status: Married    Spouse name: Not on file  . Number of children: 0  . Years of education: Not on file  .  Highest education level: Not on file  Occupational History  . Occupation: retired  Scientific laboratory technician  . Financial resource strain: Not on file  . Food insecurity    Worry: Not on file    Inability: Not on file  . Transportation needs    Medical: Not on file    Non-medical: Not on file  Tobacco Use  . Smoking status: Former Smoker    Quit date: 07/31/1977    Years since quitting: 41.7  . Smokeless tobacco: Never Used  . Tobacco comment: quit 30 yrs ago  Substance and Sexual Activity  . Alcohol use: No  . Drug use: No  . Sexual activity: Never    Birth  control/protection: Abstinence  Lifestyle  . Physical activity    Days per week: Not on file    Minutes per session: Not on file  . Stress: Not on file  Relationships  . Social Herbalist on phone: Not on file    Gets together: Not on file    Attends religious service: Not on file    Active member of club or organization: Not on file    Attends meetings of clubs or organizations: Not on file    Relationship status: Not on file  . Intimate partner violence    Fear of current or ex partner: Not on file    Emotionally abused: Not on file    Physically abused: Not on file    Forced sexual activity: Not on file  Other Topics Concern  . Not on file  Social History Narrative  . Not on file    FAMILY HISTORY: Family History  Problem Relation Age of Onset  . Heart disease Mother   . Stroke Mother   . Cancer Father   . Stroke Maternal Grandfather   . Colon cancer Neg Hx   . Pancreatic cancer Neg Hx   . Rectal cancer Neg Hx   . Stomach cancer Neg Hx     ALLERGIES:  is allergic to shellfish allergy.  MEDICATIONS:  Current Outpatient Medications  Medication Sig Dispense Refill  . ACCU-CHEK SOFTCLIX LANCETS lancets Use as instructed 100 each 12  . AMBULATORY NON FORMULARY MEDICATION Accu-chek aviva plus test strips and accu-chek softclix lancets, use to check blood sugar up to four times a day. Dx Type 2 diabetes. 100 Units 3  . atorvastatin (LIPITOR) 10 MG tablet TAKE 1 TABLET (10 MG TOTAL) BY MOUTH DAILY. DUE FOR LAB WORK 90 tablet 1  . enalapril (VASOTEC) 2.5 MG tablet TAKE 1 TABLET BY MOUTH EVERY DAY 90 tablet 0  . glucose blood (ACCU-CHEK AVIVA PLUS) test strip 1 each by Other route as needed for other (daily or up to 4 times per day as directed). Use as instructed 200 each 99  . metFORMIN (GLUCOPHAGE) 500 MG tablet Take 2 tablets (1,000 mg total) by mouth daily with breakfast. 180 tablet 3  . Multiple Vitamin (MULTIVITAMIN WITH MINERALS) TABS tablet Take 1 tablet  daily by mouth.    . Omega-3 Fatty Acids (FISH OIL PO) Take by mouth.    . polyethylene glycol (MIRALAX) packet Take 17 g by mouth daily. 28 each 0   No current facility-administered medications for this visit.     REVIEW OF SYSTEMS:    A 10+ POINT REVIEW OF SYSTEMS WAS OBTAINED including neurology, dermatology, psychiatry, cardiac, respiratory, lymph, extremities, GI, GU, Musculoskeletal, constitutional, breasts, reproductive, HEENT.  All pertinent positives are noted in the HPI.  All  others are negative.   PHYSICAL EXAMINATION: ECOG PERFORMANCE STATUS: 0 - Asymptomatic  . Vitals:   05/02/19 1327  BP: 130/78  Pulse: 81  Resp: 18  Temp: 99.1 F (37.3 C)  SpO2: 100%   Filed Weights   05/02/19 1327  Weight: 151 lb 4.8 oz (68.6 kg)   .Body mass index is 23.01 kg/m.  GENERAL:alert, in no acute distress and comfortable SKIN: no acute rashes, small nodules on left ear EYES: conjunctiva are pink and non-injected, sclera anicteric OROPHARYNX: MMM, no exudates, no oropharyngeal erythema or ulceration NECK: supple, no JVD LYMPH:  no palpable lymphadenopathy in the cervical, axillary or inguinal regions LUNGS: clear to auscultation b/l with normal respiratory effort HEART: regular rate & rhythm ABDOMEN:  normoactive bowel sounds , non tender, not distended. No palpable hepatosplenomegaly.  Extremity: no pedal edema PSYCH: alert & oriented x 3 with fluent speech NEURO: no focal motor/sensory deficits   LABORATORY DATA:  I have reviewed the data as listed  . CBC Latest Ref Rng & Units 05/02/2019 09/20/2018 03/22/2018  WBC 4.0 - 10.5 K/uL 27.1(H) 20.2(H) 18.4(H)  Hemoglobin 13.0 - 17.0 g/dL 12.3(L) 13.1 13.1  Hematocrit 39.0 - 52.0 % 37.6(L) 40.0 39.2  Platelets 150 - 400 K/uL 163 160 146   . CBC    Component Value Date/Time   WBC 27.1 (H) 05/02/2019 1247   RBC 4.07 (L) 05/02/2019 1247   HGB 12.3 (L) 05/02/2019 1247   HGB 13.1 03/22/2018 0944   HGB 12.8 (L) 05/29/2017  1141   HCT 37.6 (L) 05/02/2019 1247   HCT 38.7 05/29/2017 1141   PLT 163 05/02/2019 1247   PLT 146 03/22/2018 0944   PLT 151 05/29/2017 1141   MCV 92.4 05/02/2019 1247   MCV 91.3 05/29/2017 1141   MCH 30.2 05/02/2019 1247   MCHC 32.7 05/02/2019 1247   RDW 14.1 05/02/2019 1247   RDW 13.8 05/29/2017 1141   LYMPHSABS 18.5 (H) 05/02/2019 1247   LYMPHSABS 8.8 (H) 05/29/2017 1141   MONOABS 2.3 (H) 05/02/2019 1247   MONOABS 0.7 05/29/2017 1141   EOSABS 0.2 05/02/2019 1247   EOSABS 0.1 05/29/2017 1141   BASOSABS 0.1 05/02/2019 1247   BASOSABS 0.0 05/29/2017 1141    . CMP Latest Ref Rng & Units 05/02/2019 12/11/2018 09/20/2018  Glucose 70 - 99 mg/dL 156(H) 148(H) 149(H)  BUN 8 - 23 mg/dL 23 24 24(H)  Creatinine 0.61 - 1.24 mg/dL 1.12 1.03 1.09  Sodium 135 - 145 mmol/L 143 142 142  Potassium 3.5 - 5.1 mmol/L 4.2 4.4 4.6  Chloride 98 - 111 mmol/L 111 106 108  CO2 22 - 32 mmol/L '23 27 23  '$ Calcium 8.9 - 10.3 mg/dL 9.2 9.4 9.4  Total Protein 6.5 - 8.1 g/dL 7.1 - 7.6  Total Bilirubin 0.3 - 1.2 mg/dL 0.7 - 1.1  Alkaline Phos 38 - 126 U/L 80 - 88  AST 15 - 41 U/L 18 - 19  ALT 0 - 44 U/L 16 - 19   06/08/17 FISH CLL Prognostic Panel:    06/08/17 Cytogenetics:   05/29/17 Peripheral blood flow cytometry:    RADIOGRAPHIC STUDIES: I have personally reviewed the radiological images as listed and agreed with the findings in the report. No results found.  ASSESSMENT & PLAN:  78 y.o. male with  1. Chronic Lymphocytic Leukemia Rai 1  06/08/17 CLL FISH Prognostic panel negative for del17p, del11q, del13q, indicating intermediate risk  PLAN: -Discussed pt labwork today, 05/02/2019; WBC have been increasing gradually -The  pt shows no clinical or lab progression of his CLL at this time.  -No indication to initiate treatment at this time.   -Discussed again the criteria for treatment including constitutional symptoms, fast doubling rate of lymphocytes, other blood count effects, or  splenomegaly or bulky symptomatic disease. -Recommend establishing care with a dermatologist for evaluation of skin nodules on left ear. The pt will mention this at his appointment with his PCP next month. -Will see the pt back in 6 months   RTC with Dr Irene Limbo with labs in 6 months    All of the patients questions were answered with apparent satisfaction. The patient knows to call the clinic with any problems, questions or concerns.  The total time spent in the appt was 20 minutes and more than 50% was on counseling and direct patient cares.   Sullivan Lone MD MS AAHIVMS [facility] Hematology/Oncology Physician [facility]  (Office):       [phone] (Work cell):  [phone] (Fax):           [phone]  04/30/2019 8:18 PM  I, De Burrs, am acting as a scribe for Dr. Irene Limbo  .I have reviewed the above documentation for accuracy and completeness, and I agree with the above. [name] MD

## 2019-05-02 ENCOUNTER — Other Ambulatory Visit: Payer: Self-pay

## 2019-05-02 ENCOUNTER — Inpatient Hospital Stay: Payer: Medicare HMO | Admitting: Hematology

## 2019-05-02 ENCOUNTER — Telehealth: Payer: Self-pay | Admitting: Hematology

## 2019-05-02 ENCOUNTER — Inpatient Hospital Stay: Payer: Medicare HMO | Attending: Hematology

## 2019-05-02 VITALS — BP 130/78 | HR 81 | Temp 99.1°F | Resp 18 | Ht 68.0 in | Wt 151.3 lb

## 2019-05-02 DIAGNOSIS — C9190 Lymphoid leukemia, unspecified not having achieved remission: Secondary | ICD-10-CM | POA: Insufficient documentation

## 2019-05-02 DIAGNOSIS — I10 Essential (primary) hypertension: Secondary | ICD-10-CM | POA: Diagnosis not present

## 2019-05-02 DIAGNOSIS — C9110 Chronic lymphocytic leukemia of B-cell type not having achieved remission: Secondary | ICD-10-CM

## 2019-05-02 DIAGNOSIS — E785 Hyperlipidemia, unspecified: Secondary | ICD-10-CM | POA: Diagnosis not present

## 2019-05-02 DIAGNOSIS — I700 Atherosclerosis of aorta: Secondary | ICD-10-CM | POA: Diagnosis not present

## 2019-05-02 DIAGNOSIS — E119 Type 2 diabetes mellitus without complications: Secondary | ICD-10-CM | POA: Diagnosis not present

## 2019-05-02 DIAGNOSIS — Z87442 Personal history of urinary calculi: Secondary | ICD-10-CM | POA: Insufficient documentation

## 2019-05-02 DIAGNOSIS — Z87891 Personal history of nicotine dependence: Secondary | ICD-10-CM | POA: Diagnosis not present

## 2019-05-02 DIAGNOSIS — K5900 Constipation, unspecified: Secondary | ICD-10-CM | POA: Diagnosis not present

## 2019-05-02 DIAGNOSIS — Z79899 Other long term (current) drug therapy: Secondary | ICD-10-CM | POA: Diagnosis not present

## 2019-05-02 LAB — CBC WITH DIFFERENTIAL/PLATELET
Abs Immature Granulocytes: 0.08 10*3/uL — ABNORMAL HIGH (ref 0.00–0.07)
Basophils Absolute: 0.1 10*3/uL (ref 0.0–0.1)
Basophils Relative: 0 %
Eosinophils Absolute: 0.2 10*3/uL (ref 0.0–0.5)
Eosinophils Relative: 1 %
HCT: 37.6 % — ABNORMAL LOW (ref 39.0–52.0)
Hemoglobin: 12.3 g/dL — ABNORMAL LOW (ref 13.0–17.0)
Immature Granulocytes: 0 %
Lymphocytes Relative: 69 %
Lymphs Abs: 18.5 10*3/uL — ABNORMAL HIGH (ref 0.7–4.0)
MCH: 30.2 pg (ref 26.0–34.0)
MCHC: 32.7 g/dL (ref 30.0–36.0)
MCV: 92.4 fL (ref 80.0–100.0)
Monocytes Absolute: 2.3 10*3/uL — ABNORMAL HIGH (ref 0.1–1.0)
Monocytes Relative: 8 %
Neutro Abs: 6 10*3/uL (ref 1.7–7.7)
Neutrophils Relative %: 22 %
Platelets: 163 10*3/uL (ref 150–400)
RBC: 4.07 MIL/uL — ABNORMAL LOW (ref 4.22–5.81)
RDW: 14.1 % (ref 11.5–15.5)
WBC: 27.1 10*3/uL — ABNORMAL HIGH (ref 4.0–10.5)
nRBC: 0 % (ref 0.0–0.2)

## 2019-05-02 LAB — CMP (CANCER CENTER ONLY)
ALT: 16 U/L (ref 0–44)
AST: 18 U/L (ref 15–41)
Albumin: 4.2 g/dL (ref 3.5–5.0)
Alkaline Phosphatase: 80 U/L (ref 38–126)
Anion gap: 9 (ref 5–15)
BUN: 23 mg/dL (ref 8–23)
CO2: 23 mmol/L (ref 22–32)
Calcium: 9.2 mg/dL (ref 8.9–10.3)
Chloride: 111 mmol/L (ref 98–111)
Creatinine: 1.12 mg/dL (ref 0.61–1.24)
GFR, Est AFR Am: >60 mL/min (ref >60)
GFR, Estimated: >60 mL/min (ref >60)
Glucose, Bld: 156 mg/dL — ABNORMAL HIGH (ref 70–99)
Potassium: 4.2 mmol/L (ref 3.5–5.1)
Sodium: 143 mmol/L (ref 135–145)
Total Bilirubin: 0.7 mg/dL (ref 0.3–1.2)
Total Protein: 7.1 g/dL (ref 6.5–8.1)

## 2019-05-02 LAB — LACTATE DEHYDROGENASE: LDH: 132 U/L (ref 98–192)

## 2019-05-02 NOTE — Telephone Encounter (Signed)
Scheduled appt per 8/27 los.  Spoke with patient and he is aware of his appt date and time.

## 2019-05-03 DIAGNOSIS — H5203 Hypermetropia, bilateral: Secondary | ICD-10-CM | POA: Diagnosis not present

## 2019-05-03 DIAGNOSIS — Z961 Presence of intraocular lens: Secondary | ICD-10-CM | POA: Diagnosis not present

## 2019-06-03 ENCOUNTER — Ambulatory Visit: Payer: Medicare HMO | Admitting: Osteopathic Medicine

## 2019-08-24 ENCOUNTER — Other Ambulatory Visit: Payer: Self-pay | Admitting: Osteopathic Medicine

## 2019-08-24 DIAGNOSIS — E119 Type 2 diabetes mellitus without complications: Secondary | ICD-10-CM

## 2019-08-24 NOTE — Telephone Encounter (Signed)
Forwarding medication refill requests to the clinical pool for review. 

## 2019-09-18 ENCOUNTER — Other Ambulatory Visit: Payer: Self-pay | Admitting: Osteopathic Medicine

## 2019-09-18 DIAGNOSIS — E119 Type 2 diabetes mellitus without complications: Secondary | ICD-10-CM

## 2019-09-18 NOTE — Telephone Encounter (Signed)
Please review for refill- patient at [facility] 

## 2019-09-23 ENCOUNTER — Ambulatory Visit (INDEPENDENT_AMBULATORY_CARE_PROVIDER_SITE_OTHER): Payer: Medicare HMO | Admitting: Osteopathic Medicine

## 2019-09-23 ENCOUNTER — Other Ambulatory Visit: Payer: Self-pay

## 2019-09-23 ENCOUNTER — Encounter: Payer: Self-pay | Admitting: Osteopathic Medicine

## 2019-09-23 VITALS — BP 165/86 | HR 77 | Temp 98.2°F | Wt 154.1 lb

## 2019-09-23 DIAGNOSIS — C9110 Chronic lymphocytic leukemia of B-cell type not having achieved remission: Secondary | ICD-10-CM | POA: Diagnosis not present

## 2019-09-23 DIAGNOSIS — E119 Type 2 diabetes mellitus without complications: Secondary | ICD-10-CM

## 2019-09-23 DIAGNOSIS — E1159 Type 2 diabetes mellitus with other circulatory complications: Secondary | ICD-10-CM

## 2019-09-23 DIAGNOSIS — I10 Essential (primary) hypertension: Secondary | ICD-10-CM

## 2019-09-23 LAB — POCT GLYCOSYLATED HEMOGLOBIN (HGB A1C): Hemoglobin A1C: 6.8 % — AB (ref 4.0–5.6)

## 2019-09-23 MED ORDER — ATORVASTATIN CALCIUM 10 MG PO TABS: 10.0000 mg | ORAL_TABLET | Freq: Every day | ORAL | 3 refills | Status: DC

## 2019-09-23 MED ORDER — ENALAPRIL MALEATE 2.5 MG PO TABS: 2.5000 mg | ORAL_TABLET | Freq: Every day | ORAL | 3 refills | Status: DC

## 2019-09-23 MED ORDER — METFORMIN HCL 500 MG PO TABS: 1000.0000 mg | ORAL_TABLET | Freq: Every day | ORAL | 3 refills | Status: DC

## 2019-09-23 NOTE — Progress Notes (Signed)
Tony Banks is a 79 y.o. male who presents to  [place] at [facility]  today, 09/23/19, seeking care for the following:  The encounter diagnosis was Type 2 diabetes mellitus without complication, without long-term current use of insulin ([place]).   DM2:  A1C  04/2018: 6.2% 10/2018: 6.9% Reports increased appetite - lots of cookies! "Got on the christmas diet and never got off of it." Couldn't blame him, sounds delicious. Was taking Metformin 500 mg daily, was on 1000 daily in past and did fine with this.  Last visit 01/29/19: 6.8% doing well all things considered with the coronavirus pandemic, his activity has been a bit decreased, diet has not been great!  Today 09/23/19: 6.8% No ACE/ARB last visit, we started Enalapril  Foot exam ok last visit  Unable to pee for microalbumin.   HTN: Didn't take BP meds today since he was coming to the doctor     [place] with other pertinent history/findings:  1. Type 2 diabetes mellitus without complication, without long-term current use of insulin ([place]) STABLE and doing well  2. Hypertension associated with diabetes ([place]) UNCONTROLLED d/t meds not taken today, will recheck at his medicare AWV in a few weeks  3. CLL (chronic lymphocytic leukemia) ([place]) STABLE following w/ HemOnc       Orders Placed This Encounter  Procedures  . POCT HgB A1C   Results for orders placed or performed in visit on 09/23/19 (from the past 24 hour(s))  POCT HgB A1C     Status: Abnormal   Collection Time: 09/23/19  8:43 AM  Result Value Ref Range   Hemoglobin A1C 6.8 (A) 4.0 - 5.6 %   HbA1c POC (<> result, manual entry)     HbA1c, POC (prediabetic range)     HbA1c, POC (controlled diabetic range)      Meds ordered this encounter  Medications  . metFORMIN (GLUCOPHAGE) 500 MG tablet    Sig: Take 2 tablets (1,000 mg total) by mouth daily with breakfast.    Dispense:  180 tablet    Refill:  3  .  enalapril (VASOTEC) 2.5 MG tablet    Sig: Take 1 tablet (2.5 mg total) by mouth daily.    Dispense:  90 tablet    Refill:  3  . atorvastatin (LIPITOR) 10 MG tablet    Sig: Take 1 tablet (10 mg total) by mouth daily. Due for lab work    Dispense:  90 tablet    Refill:  3    There are no Patient Instructions on file for this visit.    Follow-up instructions: Return in about 2 weeks (around 10/07/2019) for [place], A1C RECHECK IN 6 MONTHS W/ DR Sheppard Coil.         BP (!) 165/86 (BP Location: Left Arm, Patient Position: Sitting, Cuff Size: Normal)   Pulse 77   Temp 98.2 F (36.8 C) (Oral)   Wt 154 lb 1.3 oz (69.9 kg)   BMI 23.43 kg/m   Current Meds  Medication Sig  . ACCU-CHEK SOFTCLIX LANCETS lancets Use as instructed  . AMBULATORY NON FORMULARY MEDICATION Accu-chek aviva plus test strips and accu-chek softclix lancets, use to check blood sugar up to four times a day. Dx Type 2 diabetes.  Marland Kitchen atorvastatin (LIPITOR) 10 MG tablet TAKE 1 TABLET (10 MG TOTAL) BY MOUTH DAILY. DUE FOR LAB WORK  . enalapril (VASOTEC) 2.5 MG tablet TAKE 1 TABLET BY MOUTH EVERY DAY  .  glucose blood (ACCU-CHEK AVIVA PLUS) test strip 1 each by Other route as needed for other (daily or up to 4 times per day as directed). Use as instructed  . metFORMIN (GLUCOPHAGE) 500 MG tablet Take 2 tablets (1,000 mg total) by mouth daily with breakfast.  . Multiple Vitamin (MULTIVITAMIN WITH MINERALS) TABS tablet Take 1 tablet daily by mouth.  . Omega-3 Fatty Acids (FISH OIL PO) Take by mouth.  . polyethylene glycol (MIRALAX) packet Take 17 g by mouth daily.    No results found for this or any previous visit (from the past 72 hour(s)).  No results found.  Depression screen [facility] 2/9 10/29/2018 04/26/2018 10/27/2017  Decreased Interest 0 0 0  Down, Depressed, Hopeless 0 0 0  PHQ - 2 Score 0 0 0  Altered sleeping 0 1 0  Tired, decreased energy 0 2 0  Change in appetite 3 3 0  Feeling bad or failure  about yourself  0 0 0  Trouble concentrating 0 0 0  Moving slowly or fidgety/restless 0 0 0  Suicidal thoughts 0 0 0  PHQ-9 Score 3 6 0  Difficult doing work/chores Not difficult at all Not difficult at all -    GAD 7 : Generalized Anxiety Score 10/29/2018 04/26/2018  Nervous, Anxious, on Edge 0 0  Control/stop worrying 0 0  Worry too much - different things 0 0  Trouble relaxing 0 0  Restless 0 1  Easily annoyed or irritable 0 1  Afraid - awful might happen 0 0  Total GAD 7 Score 0 2  Anxiety Difficulty Not difficult at all Not difficult at all      All questions at time of visit were answered - patient instructed to contact office with any additional concerns or updates.  ER/RTC precautions were reviewed with the patient.  Please note: voice recognition software was used to produce this document, and typos may escape review. Please contact Dr. Sheppard Coil for any needed clarifications.

## 2019-09-24 LAB — COMPLETE METABOLIC PANEL WITH GFR
AG Ratio: 2 (calc) (ref 1.0–2.5)
ALT: 20 U/L (ref 9–46)
AST: 20 U/L (ref 10–35)
Albumin: 4.7 g/dL (ref 3.6–5.1)
Alkaline phosphatase (APISO): 79 U/L (ref 35–144)
BUN: 20 mg/dL (ref 7–25)
CO2: 26 mmol/L (ref 20–32)
Calcium: 9.5 mg/dL (ref 8.6–10.3)
Chloride: 106 mmol/L (ref 98–110)
Creat: 0.96 mg/dL (ref 0.70–1.18)
GFR, Est African American: 88 mL/min/{1.73_m2} (ref >=60)
GFR, Est Non African American: 76 mL/min/{1.73_m2} (ref >=60)
Globulin: 2.3 g/dL (calc) (ref 1.9–3.7)
Glucose, Bld: 135 mg/dL — ABNORMAL HIGH (ref 65–99)
Potassium: 4.5 mmol/L (ref 3.5–5.3)
Sodium: 140 mmol/L (ref 135–146)
Total Bilirubin: 0.6 mg/dL (ref 0.2–1.2)
Total Protein: 7 g/dL (ref 6.1–8.1)

## 2019-09-24 LAB — MICROALBUMIN / CREATININE URINE RATIO
Creatinine, Urine: 109 mg/dL (ref 20–320)
Microalb Creat Ratio: 6 mcg/mg creat (ref <30)
Microalb, Ur: 0.6 mg/dL

## 2019-09-24 LAB — LIPID PANEL
Cholesterol: 118 mg/dL (ref <200)
HDL: 34 mg/dL — ABNORMAL LOW (ref >=40)
LDL Cholesterol (Calc): 67 mg/dL (calc)
Non-HDL Cholesterol (Calc): 84 mg/dL (calc) (ref <130)
Total CHOL/HDL Ratio: 3.5 (calc) (ref <5.0)
Triglycerides: 88 mg/dL (ref <150)

## 2019-10-04 NOTE — Progress Notes (Signed)
Subjective:   Tony Banks is a 79 y.o. male who presents for Medicare Annual/Subsequent preventive examination.  Review of Systems:  No ROS.  Medicare Wellness Virtual Visit.  Visual/audio telehealth visit, UTA vital signs.   See social history for additional risk factors.    Cardiac Risk Factors include: advanced age (>58men, >70 women);diabetes mellitus;male gender;hypertension  Sleep patterns:  Getting 10 hours of sleep a night.Wakes up sometimes 1 time a night to void. Wakes up and feels rested and ready for the day.  Home Safety/Smoke Alarms: Feels safe in home. Smoke alarms in place.  Living environment; Lives with wife in 1 story home no stairs in or around the home. Shower is a step over combo and grab bars in place. Seat Belt Safety/Bike Helmet: Wears seat belt.  Male:   CCS-  Aged out   PSA-  Aged out Lab Results  Component Value Date   PSA 0.4 04/19/2017   PSA 1.0 04/27/2016   PSA 0.55 04/06/2015        Objective:    Vitals: BP (!) 147/61   Pulse 79   Ht 5\' 8"  (1.727 m)   Wt 156 lb (70.8 kg)   SpO2 100%   BMI 23.72 kg/m   Body mass index is 23.72 kg/m.  Advanced Directives 10/07/2019 06/22/2017 05/13/2014 05/02/2014 04/26/2014  Does Patient Have a Medical Advance Directive? No No No No No  Would patient like information on creating a medical advance directive? No - Patient declined No - Patient declined No - patient declined information Yes - Educational materials given No - patient declined information    Tobacco Social History   Tobacco Use  Smoking Status Former Smoker  . Quit date: 07/31/1977  . Years since quitting: 42.2  Smokeless Tobacco Never Used  Tobacco Comment   quit 30 yrs ago     Counseling given: No Comment: quit 30 yrs ago   Clinical Intake:  Pre-visit preparation completed: Yes  Pain : No/denies pain     Nutritional Risks: None Diabetes: Yes CBG done?: No(ast home yesterday 120) Did pt. bring in CBG monitor from home?:  No  How often do you need to have someone help you when you read instructions, pamphlets, or other written materials from your doctor or pharmacy?: 1 - Never What is the last grade level you completed in school?: 14  Interpreter Needed?: No  Information entered by :: Orlie Dakin, LPN  Past Medical History:  Diagnosis Date  . Aortic atherosclerosis ([place])   . CLL (chronic lymphocytic leukemia) ([place])   . Constipation    severe  . Diabetes mellitus without complication ([place])   . Diverticulosis   . HLD (hyperlipidemia)   . Hypertension   . Nephrolithiasis    Past Surgical History:  Procedure Laterality Date  . FINGER FRACTURE SURGERY Right    middle finger  . SHOULDER ARTHROSCOPY Bilateral   . VASECTOMY     Family History  Problem Relation Age of Onset  . Heart disease Mother   . Stroke Mother   . Cancer Father   . Stroke Maternal Grandfather   . Colon cancer Neg Hx   . Pancreatic cancer Neg Hx   . Rectal cancer Neg Hx   . Stomach cancer Neg Hx    Social History   Socioeconomic History  . Marital status: Married    Spouse name: [name]  . Number of children: 3  . Years of education: 6  . Highest education level: Some  college, no degree  Occupational History  . Occupation: retired  Tobacco Use  . Smoking status: Former Smoker    Quit date: 07/31/1977    Years since quitting: 42.2  . Smokeless tobacco: Never Used  . Tobacco comment: quit 30 yrs ago  Substance and Sexual Activity  . Alcohol use: No  . Drug use: No  . Sexual activity: Never    Birth control/protection: Abstinence  Other Topics Concern  . Not on file  Social History Narrative   Drinks 1-2 cups of coffee.   Social Determinants of Health   Financial Resource Strain:   . Difficulty of Paying Living Expenses: Not on file  Food Insecurity:   . Worried About Charity fundraiser in the Last Year: Not on file  . Ran Out of Food in the Last Year: Not on file  Transportation Needs:   . Lack of  Transportation (Medical): Not on file  . Lack of Transportation (Non-Medical): Not on file  Physical Activity:   . Days of Exercise per Week: Not on file  . Minutes of Exercise per Session: Not on file  Stress:   . Feeling of Stress : Not on file  Social Connections:   . Frequency of Communication with Friends and Family: Not on file  . Frequency of Social Gatherings with Friends and Family: Not on file  . Attends Religious Services: Not on file  . Active Member of Clubs or Organizations: Not on file  . Attends Archivist Meetings: Not on file  . Marital Status: Not on file    Outpatient Encounter Medications as of 10/07/2019  Medication Sig  . ACCU-CHEK SOFTCLIX LANCETS lancets Use as instructed  . AMBULATORY NON FORMULARY MEDICATION Accu-chek aviva plus test strips and accu-chek softclix lancets, use to check blood sugar up to four times a day. Dx Type 2 diabetes.  Marland Kitchen atorvastatin (LIPITOR) 10 MG tablet Take 1 tablet (10 mg total) by mouth daily. Due for lab work  . enalapril (VASOTEC) 2.5 MG tablet Take 1 tablet (2.5 mg total) by mouth daily.  Marland Kitchen glucose blood (ACCU-CHEK AVIVA PLUS) test strip 1 each by Other route as needed for other (daily or up to 4 times per day as directed). Use as instructed  . metFORMIN (GLUCOPHAGE) 500 MG tablet Take 2 tablets (1,000 mg total) by mouth daily with breakfast.  . Multiple Vitamin (MULTIVITAMIN WITH MINERALS) TABS tablet Take 1 tablet daily by mouth.  . Omega-3 Fatty Acids (FISH OIL PO) Take by mouth.  . polyethylene glycol (MIRALAX) packet Take 17 g by mouth daily.   No facility-administered encounter medications on file as of 10/07/2019.    Activities of Daily Living In your present state of health, do you have any difficulty performing the following activities: 10/07/2019  Hearing? Y  Comment some hearing loss  Vision? N  Difficulty concentrating or making decisions? N  Walking or climbing stairs? N  Dressing or bathing? N  Doing  errands, shopping? N  Preparing Food and eating ? N  Using the Toilet? N  In the past six months, have you accidently leaked urine? N  Do you have problems with loss of bowel control? N  Managing your Medications? N  Managing your Finances? N  Housekeeping or managing your Housekeeping? N  Some recent data might be hidden    Patient Care Team: Emeterio Reeve, DO as PCP - General (Osteopathic Medicine)   Assessment:   This is a routine wellness examination for K[place].Physical  assessment deferred to PCP.   Exercise Activities and Dietary recommendations Current Exercise Habits: Home exercise routine, Type of exercise: strength training/weights;Other - see comments(stationary bike), Time (Minutes): 60, Frequency (Times/Week): 7, Weekly Exercise (Minutes/Week): 420, Intensity: Mild, Exercise limited by: None identified Diet Eats a healthy diet of vegetables, proteins. Breakfast: Fiber one with banana or oatmeal Lunch: sandwich and chips and cookie Dinner: Meat and vegetables Drinks coffee or soda daily      Goals    . Patient Stated     Patient stated wants to try to cut back on his sugar intake for the coming year.       Fall Risk Fall Risk  10/07/2019 04/26/2017 05/02/2014  Falls in the past year? 1 No No  Number falls in past yr: 0 - -  Comment slipped on wet mat outside - -  Injury with Fall? 0 - -  Risk for fall due to : No Fall Risks - -  Follow up Falls prevention discussed - -   Is the patient's home free of loose throw rugs in walkways, pet beds, electrical cords, etc?   yes      Grab bars in the bathroom? yes      Handrails on the stairs?   no      Adequate lighting?   yes  Depression Screen PHQ 2/9 Scores 10/07/2019 10/29/2018 04/26/2018 10/27/2017  PHQ - 2 Score 0 0 0 0  PHQ- 9 Score - 3 6 0    Cognitive Function     6CIT Screen 10/07/2019 04/26/2017  What Year? 0 points 0 points  What month? 0 points 0 points  What time? 0 points 0 points  Count back from 20  0 points 0 points  Months in reverse 0 points 0 points  Repeat phrase 0 points 0 points  Total Score 0 0    Immunization History  Administered Date(s) Administered  . Fluad Quad(high Dose 65+) 07/17/2019  . Influenza, High Dose Seasonal PF 06/09/2017, 06/27/2018  . Influenza-Unspecified 07/10/2015, 07/06/2016  . Pneumococcal Conjugate-13 04/26/2016  . Pneumococcal Polysaccharide-23 04/27/2014    Screening Tests Health Maintenance  Topic Date Due  . OPHTHALMOLOGY EXAM  05/01/2019  . TETANUS/TDAP  04/05/2025 (Originally 11/18/1959)  . FOOT EXAM  10/30/2019  . HEMOGLOBIN A1C  03/22/2020  . INFLUENZA VACCINE  Completed  . PNA vac Low Risk Adult  Completed      Plan:    Please schedule your next medicare wellness visit with me in 1 yr.  Mr. Canlas , Thank you for taking time to come for your Medicare Wellness Visit. I appreciate your ongoing commitment to your health goals. Please review the following plan we discussed and let me know if I can assist you in the future.  Continue doing brain stimulating activities (puzzles, reading, adult coloring books, staying active) to keep memory sharp.    These are the goals we discussed: Goals    . Patient Stated     Patient stated wants to try to cut back on his sugar intake for the coming year.       This is a list of the screening recommended for you and due dates:  Health Maintenance  Topic Date Due  . Eye exam for diabetics  05/01/2019  . Tetanus Vaccine  04/05/2025*  . Complete foot exam   10/30/2019  . Hemoglobin A1C  03/22/2020  . Flu Shot  Completed  . Pneumonia vaccines  Completed  *Topic was postponed. The date shown  is not the original due date.     I have personally reviewed and noted the following in the patient's chart:   . Medical and social history . Use of alcohol, tobacco or illicit drugs  . Current medications and supplements . Functional ability and status . Nutritional status . Physical  activity . Advanced directives . List of other physicians . Hospitalizations, surgeries, and ER visits in previous 12 months . Vitals . Screenings to include cognitive, depression, and falls . Referrals and appointments  In addition, I have reviewed and discussed with patient certain preventive protocols, quality metrics, and best practice recommendations. A written personalized care plan for preventive services as well as general preventive health recommendations were provided to patient.     Joanne Chars, LPN  [phone]

## 2019-10-07 ENCOUNTER — Other Ambulatory Visit: Payer: Self-pay

## 2019-10-07 ENCOUNTER — Ambulatory Visit (INDEPENDENT_AMBULATORY_CARE_PROVIDER_SITE_OTHER): Payer: Medicare HMO | Admitting: Osteopathic Medicine

## 2019-10-07 VITALS — BP 147/61 | HR 79 | Ht 68.0 in | Wt 156.0 lb

## 2019-10-07 DIAGNOSIS — Z0000 Encounter for general adult medical examination without abnormal findings: Secondary | ICD-10-CM

## 2019-10-07 NOTE — Patient Instructions (Signed)
Please schedule your next medicare wellness visit with me in 1 yr.  Tony Banks , Thank you for taking time to come for your Medicare Wellness Visit. I appreciate your ongoing commitment to your health goals. Please review the following plan we discussed and let me know if I can assist you in the future.  Continue doing brain stimulating activities (puzzles, reading, adult coloring books, staying active) to keep memory sharp.  These are the goals we discussed: Goals    . Patient Stated     Patient stated wants to try to cut back on his sugar intake for the coming year.

## 2019-10-07 NOTE — Progress Notes (Signed)
Medical screening examination/treatment was performed by qualified clinical staff member and as supervising physician I was immediately available for consultation/collaboration. I have reviewed documentation and agree with assessment and plan.  Emeterio Reeve, DO

## 2019-10-28 ENCOUNTER — Telehealth: Payer: Self-pay | Admitting: Hematology

## 2019-10-28 NOTE — Telephone Encounter (Signed)
Called patient to reschedule 02/25 appointments to 03/12 per providers request, patient has been called and notified.

## 2019-10-31 ENCOUNTER — Ambulatory Visit: Payer: Medicare HMO | Admitting: Hematology

## 2019-10-31 ENCOUNTER — Other Ambulatory Visit: Payer: Medicare HMO

## 2019-11-14 NOTE — Progress Notes (Signed)
  HEMATOLOGY/ONCOLOGY CLINIC NOTE  Date of Service: 11/14/2019  Patient Care Team: Alexander, Natalie, DO as PCP - General (Osteopathic Medicine)  CHIEF COMPLAINTS/PURPOSE OF CONSULTATION:  Chronic Lymphocytic Leukemia   HISTORY OF PRESENTING ILLNESS:   Tony Banks is a wonderful 78 y.o. male who has been previously followed by my colleague Dr. Mikhail Perlov for evaluation and management of Chronic Lymphocytic Leukemia. He is accompanied today by his wife. The pt reports that he is doing well overall. Verbal consent has been given by the pt for a clinical observer, Anushka Kaiwar, to be present.    The pt was diagnosed with CLL in October 2018, following a Peripheral blood flow cytometry and cytogenetics report. 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 The pt notes that his CLL was picked up off of routine labs and was not prompted by any associated symptoms.   The pt reports that he has not had any symptoms or concerns in the last 3 months he denies any constitutional symptoms or noticing any new lumps or bumps.    Most recent lab results (03/22/18) of CBC w/diff, CMP is as follows: all values are WNL except for WBC at 18.4k, Lymphs abs at 11.9k, Glucose at 128. LDH 03/22/18 is normal at 160 QIG 03/22/18 is pending  On review of systems, pt reports good energy levels, and denies fevers, chills, night sweats, unexpected weight loss, noticing any new lumps or bumps, abdominal pains, and any other symptoms.   Interval History:   Tony Banks returns today for management and evaluation of his CLL. The patient's last visit with us was on 05/02/2019. The pt reports that he is doing well overall.  The pt reports he has had erectile dysfunction intermittently. Pt reports he has been feeling pressure around kidneys and down the right side leg. When he eats he has been feeling bloated, gas pains, and heart burns when eating. He has been  having night sweats about once a week, but once he takes blankets off he cools down. Pt has been working out for about an hour a day intermittently 5-20 minutes at a time. He has not noticed any enlarged lymph nodes on the outside. He has not gotten COVID19 vaccination yet.   Lab results today (11/15/19) of CBC w/diff and CMP is as follows: all values are WNL except for WBC at 37.7K, RBC at 4.06, Hemoglobin at 12.6, HCT at 38.1, Lymphs at 26.2K, Monocytes Abs at 4.7K, Abs Immature Granulocytes at 0.13K, Glucose at 140 11/15/2019 of LDH at 159  On review of systems, pt reports fatigue, healthy appetite, regular bowl movements, trouble urinating, night sweats, abdominal pain and denies fever, chills, unexpected weight loss and any other symptoms.    MEDICAL HISTORY:  Past Medical History:  Diagnosis Date  . Aortic atherosclerosis ([facility])   . CLL (chronic lymphocytic leukemia) ([facility])   . Constipation    severe  . Diabetes mellitus without complication ([facility])   . Diverticulosis   . HLD (hyperlipidemia)   . Hypertension   . Nephrolithiasis     SURGICAL HISTORY: Past Surgical History:  Procedure Laterality Date  . FINGER FRACTURE SURGERY Right    middle finger  . SHOULDER ARTHROSCOPY Bilateral   . VASECTOMY      SOCIAL HISTORY: Social History   Socioeconomic History  . Marital status: Married    Spouse name: [name]  . Number of children: 3  . Years of education:   14  . Highest education level: Some college, no degree  Occupational History  . Occupation: retired  Tobacco Use  . Smoking status: Former Smoker    Quit date: 07/31/1977    Years since quitting: 42.3  . Smokeless tobacco: Never Used  . Tobacco comment: quit 30 yrs ago  Substance and Sexual Activity  . Alcohol use: No  . Drug use: No  . Sexual activity: Never    Birth control/protection: Abstinence  Other Topics Concern  . Not on file  Social History Narrative   Drinks 1-2 cups of coffee.   Social Determinants of  Health   Financial Resource Strain:   . Difficulty of Paying Living Expenses:   Food Insecurity:   . Worried About Charity fundraiser in the Last Year:   . Arboriculturist in the Last Year:   Transportation Needs:   . Film/video editor (Medical):   Marland Kitchen Lack of Transportation (Non-Medical):   Physical Activity:   . Days of Exercise per Week:   . Minutes of Exercise per Session:   Stress:   . Feeling of Stress :   Social Connections:   . Frequency of Communication with Friends and Family:   . Frequency of Social Gatherings with Friends and Family:   . Attends Religious Services:   . Active Member of Clubs or Organizations:   . Attends Archivist Meetings:   Marland Kitchen Marital Status:   Intimate Partner Violence:   . Fear of Current or Ex-Partner:   . Emotionally Abused:   Marland Kitchen Physically Abused:   . Sexually Abused:     FAMILY HISTORY: Family History  Problem Relation Age of Onset  . Heart disease Mother   . Stroke Mother   . Cancer Father   . Stroke Maternal Grandfather   . Colon cancer Neg Hx   . Pancreatic cancer Neg Hx   . Rectal cancer Neg Hx   . Stomach cancer Neg Hx     ALLERGIES:  is allergic to shellfish allergy.  MEDICATIONS:  Current Outpatient Medications  Medication Sig Dispense Refill  . ACCU-CHEK SOFTCLIX LANCETS lancets Use as instructed 100 each 12  . AMBULATORY NON FORMULARY MEDICATION Accu-chek aviva plus test strips and accu-chek softclix lancets, use to check blood sugar up to four times a day. Dx Type 2 diabetes. 100 Units 3  . atorvastatin (LIPITOR) 10 MG tablet Take 1 tablet (10 mg total) by mouth daily. Due for lab work 90 tablet 3  . enalapril (VASOTEC) 2.5 MG tablet Take 1 tablet (2.5 mg total) by mouth daily. 90 tablet 3  . glucose blood (ACCU-CHEK AVIVA PLUS) test strip 1 each by Other route as needed for other (daily or up to 4 times per day as directed). Use as instructed 200 each 99  . metFORMIN (GLUCOPHAGE) 500 MG tablet Take 2  tablets (1,000 mg total) by mouth daily with breakfast. 180 tablet 3  . Multiple Vitamin (MULTIVITAMIN WITH MINERALS) TABS tablet Take 1 tablet daily by mouth.    . Omega-3 Fatty Acids (FISH OIL PO) Take by mouth.    . polyethylene glycol (MIRALAX) packet Take 17 g by mouth daily. 28 each 0   No current facility-administered medications for this visit.    REVIEW OF SYSTEMS:    A 10+ POINT REVIEW OF SYSTEMS WAS OBTAINED including neurology, dermatology, psychiatry, cardiac, respiratory, lymph, extremities, GI, GU, Musculoskeletal, constitutional, breasts, reproductive, HEENT.  All pertinent positives are noted in the HPI.  All others are negative.   PHYSICAL EXAMINATION: ECOG PERFORMANCE STATUS: 0 - Asymptomatic  Vitals:   11/15/19 0855  BP: (!) 156/67  Pulse: 80  Resp: 18  SpO2: 100%   Filed Weights   11/15/19 0855  Weight: 151 lb 11.2 oz (68.8 kg)   .Body mass index is 23.07 kg/m.   GENERAL: alert, in no acute distress and comfortable SKIN: no acute rashes, no significant lesions EYES: conjunctiva are pink and non-injected, sclera anicteric OROPHARYNX: MMM, no exudates, no oropharyngeal erythema or ulceration NECK: supple, no JVD LYMPH:  no palpable lymphadenopathy in the cervical, axillary or inguinal region,  LUNGS: clear to auscultation b/l with normal respiratory effort HEART: regular rate & rhythm ABDOMEN:  normoactive bowel sounds, not distended. Bilateral abdominal tenderness, bilateral inguinal region tenderness  Extremity: no pedal edema PSYCH: alert & oriented x 3 with fluent speech NEURO: no focal motor/sensory deficits  LABORATORY DATA:  I have reviewed the data as listed  . CBC Latest Ref Rng & Units 11/15/2019 05/02/2019 09/20/2018  WBC 4.0 - 10.5 K/uL 37.7(H) 27.1(H) 20.2(H)  Hemoglobin 13.0 - 17.0 g/dL 12.6(L) 12.3(L) 13.1  Hematocrit 39.0 - 52.0 % 38.1(L) 37.6(L) 40.0  Platelets 150 - 400 K/uL 153 163 160   . CBC    Component Value Date/Time    WBC 37.7 (H) 11/15/2019 0756   RBC 4.06 (L) 11/15/2019 0756   HGB 12.6 (L) 11/15/2019 0756   HGB 13.1 03/22/2018 0944   HGB 12.8 (L) 05/29/2017 1141   HCT 38.1 (L) 11/15/2019 0756   HCT 38.7 05/29/2017 1141   PLT 153 11/15/2019 0756   PLT 146 03/22/2018 0944   PLT 151 05/29/2017 1141   MCV 93.8 11/15/2019 0756   MCV 91.3 05/29/2017 1141   MCH 31.0 11/15/2019 0756   MCHC 33.1 11/15/2019 0756   RDW 13.6 11/15/2019 0756   RDW 13.8 05/29/2017 1141   LYMPHSABS 26.2 (H) 11/15/2019 0756   LYMPHSABS 8.8 (H) 05/29/2017 1141   MONOABS 4.7 (H) 11/15/2019 0756   MONOABS 0.7 05/29/2017 1141   EOSABS 0.3 11/15/2019 0756   EOSABS 0.1 05/29/2017 1141   BASOSABS 0.1 11/15/2019 0756   BASOSABS 0.0 05/29/2017 1141    . CMP Latest Ref Rng & Units 11/15/2019 09/23/2019 05/02/2019  Glucose 70 - 99 mg/dL 140(H) 135(H) 156(H)  BUN 8 - 23 mg/dL _0 Creatinine 0.61 - 1.24 mg/dL 1.13 0.96 1.12  Sodium 135 - 145 mmol/L 141 140 143  Potassium 3.5 - 5.1 mmol/L 4.3 4.5 4.2  Chloride 98 - 111 mmol/L 107 106 111  CO2 22 - 32 mmol/L _1 Calcium 8.9 - 10.3 mg/dL 9.1 9.5 9.2  Total Protein 6.5 - 8.1 g/dL 7.3 7.0 7.1  Total Bilirubin 0.3 - 1.2 mg/dL 0.8 0.6 0.7  Alkaline Phos 38 - 126 U/L 79 - 80  AST 15 - 41 U/L _2 ALT 0 - 44 U/L _3 . Lab Results  Component Value Date   LDH 159 11/15/2019    06/08/17 FISH CLL Prognostic Panel:    06/08/17 Cytogenetics:   05/29/17 Peripheral blood flow cytometry:    RADIOGRAPHIC STUDIES: I have personally reviewed the radiological images as listed and agreed with the findings in the report. No results found.  ASSESSMENT & PLAN:   79 y.o. male with  1. Chronic Lymphocytic Leukemia Rai 1  06/08/17 CLL FISH Prognostic panel negative for del17p, del11q, del13q, indicating intermediate risk  PLAN: -Discussed pt labwork today, 11/15/19; of CBC w/diff and CMP is as follows: all values are WNL except for WBC at 37.7K, RBC at 4.06,  Hemoglobin at 12.6, HCT at 38.1, Lymphs at 26.2K, Monocytes Abs at 4.7K, Abs Immature Granulocytes at 0.13K, Glucose at 140 -Discussed 11/15/2019 of LDH at 159 -The pt shows no clinical or lab progression of his CLL at this time.  -Advised on abdominal pain and inguinal region pain -possible urinary obstructive symptoms witll get CT abd to r/o CLL progression and significant adenopathy.  -Advised f/u with PCP about abdominal discomfort  -Recommends pt get COVID19 vaccine -Will get urine sample to check for infection -Will get repeat CT scans   FOLLOW UP: Additional urine Labs today Urgent CT chest/abd/pelvis today or Monday Phone visit with Dr Kale in 1 week    The total time spent in the appt was 30 minutes and more than 50% was on counseling and direct patient cares.  All of the patient's questions were answered with apparent satisfaction. The patient knows to call the clinic with any problems, questions or concerns.   Gautam Kale MD MS AAHIVMS [facility] C[facility] Hematology/Oncology Physician [facility]  (Office):       [phone] (Work cell):  [phone] (Fax):           [phone]  11/14/2019 4:25 PM  I, Elaine Hinkel am acting as a scribe for Dr. Gautam Kale.   .I have reviewed the above documentation for accuracy and completeness, and I agree with the above. .Gautam Kishore Kale MD  ADDENDUM  UA/UCX no evidence of UTI  CT CHest/abd/pelvis -- some increased LNadenopathy, no other overt intra abd pathology.    

## 2019-11-15 ENCOUNTER — Inpatient Hospital Stay: Payer: Medicare HMO | Admitting: Hematology

## 2019-11-15 ENCOUNTER — Inpatient Hospital Stay: Payer: Medicare HMO | Attending: Hematology

## 2019-11-15 ENCOUNTER — Ambulatory Visit (HOSPITAL_COMMUNITY)
Admission: RE | Admit: 2019-11-15 | Discharge: 2019-11-15 | Disposition: A | Discharge status: Home / Self Care | Payer: Medicare HMO | Source: Ambulatory Visit | Attending: Hematology | Admitting: Hematology

## 2019-11-15 ENCOUNTER — Other Ambulatory Visit: Payer: Self-pay

## 2019-11-15 VITALS — BP 156/67 | HR 80 | Resp 18 | Ht 68.0 in | Wt 151.7 lb

## 2019-11-15 DIAGNOSIS — C9110 Chronic lymphocytic leukemia of B-cell type not having achieved remission: Secondary | ICD-10-CM | POA: Insufficient documentation

## 2019-11-15 DIAGNOSIS — R12 Heartburn: Secondary | ICD-10-CM | POA: Diagnosis not present

## 2019-11-15 DIAGNOSIS — I10 Essential (primary) hypertension: Secondary | ICD-10-CM | POA: Diagnosis not present

## 2019-11-15 DIAGNOSIS — Z79899 Other long term (current) drug therapy: Secondary | ICD-10-CM | POA: Diagnosis not present

## 2019-11-15 DIAGNOSIS — Z87891 Personal history of nicotine dependence: Secondary | ICD-10-CM | POA: Insufficient documentation

## 2019-11-15 DIAGNOSIS — E119 Type 2 diabetes mellitus without complications: Secondary | ICD-10-CM | POA: Insufficient documentation

## 2019-11-15 DIAGNOSIS — Z87442 Personal history of urinary calculi: Secondary | ICD-10-CM | POA: Insufficient documentation

## 2019-11-15 DIAGNOSIS — K5730 Diverticulosis of large intestine without perforation or abscess without bleeding: Secondary | ICD-10-CM | POA: Diagnosis not present

## 2019-11-15 DIAGNOSIS — R590 Localized enlarged lymph nodes: Secondary | ICD-10-CM | POA: Insufficient documentation

## 2019-11-15 DIAGNOSIS — R109 Unspecified abdominal pain: Secondary | ICD-10-CM | POA: Diagnosis not present

## 2019-11-15 DIAGNOSIS — I2510 Atherosclerotic heart disease of native coronary artery without angina pectoris: Secondary | ICD-10-CM | POA: Insufficient documentation

## 2019-11-15 DIAGNOSIS — R140 Abdominal distension (gaseous): Secondary | ICD-10-CM | POA: Insufficient documentation

## 2019-11-15 DIAGNOSIS — K449 Diaphragmatic hernia without obstruction or gangrene: Secondary | ICD-10-CM | POA: Insufficient documentation

## 2019-11-15 DIAGNOSIS — N529 Male erectile dysfunction, unspecified: Secondary | ICD-10-CM | POA: Insufficient documentation

## 2019-11-15 DIAGNOSIS — N400 Enlarged prostate without lower urinary tract symptoms: Secondary | ICD-10-CM | POA: Diagnosis not present

## 2019-11-15 DIAGNOSIS — I700 Atherosclerosis of aorta: Secondary | ICD-10-CM | POA: Diagnosis not present

## 2019-11-15 DIAGNOSIS — R1030 Lower abdominal pain, unspecified: Secondary | ICD-10-CM | POA: Diagnosis not present

## 2019-11-15 DIAGNOSIS — E785 Hyperlipidemia, unspecified: Secondary | ICD-10-CM | POA: Diagnosis not present

## 2019-11-15 DIAGNOSIS — K5900 Constipation, unspecified: Secondary | ICD-10-CM | POA: Insufficient documentation

## 2019-11-15 LAB — URINALYSIS, COMPLETE (UACMP) WITH MICROSCOPIC
Bacteria, UA: NONE SEEN
Bilirubin Urine: NEGATIVE
Glucose, UA: NEGATIVE mg/dL
Hgb urine dipstick: NEGATIVE
Ketones, ur: NEGATIVE mg/dL
Nitrite: NEGATIVE
Protein, ur: NEGATIVE mg/dL
Specific Gravity, Urine: 1.01 (ref 1.005–1.030)
pH: 5 (ref 5.0–8.0)

## 2019-11-15 LAB — CMP (CANCER CENTER ONLY)
ALT: 19 U/L (ref 0–44)
AST: 20 U/L (ref 15–41)
Albumin: 4.4 g/dL (ref 3.5–5.0)
Alkaline Phosphatase: 79 U/L (ref 38–126)
Anion gap: 12 (ref 5–15)
BUN: 22 mg/dL (ref 8–23)
CO2: 22 mmol/L (ref 22–32)
Calcium: 9.1 mg/dL (ref 8.9–10.3)
Chloride: 107 mmol/L (ref 98–111)
Creatinine: 1.13 mg/dL (ref 0.61–1.24)
GFR, Est AFR Am: >60 mL/min (ref >60)
GFR, Estimated: >60 mL/min (ref >60)
Glucose, Bld: 140 mg/dL — ABNORMAL HIGH (ref 70–99)
Potassium: 4.3 mmol/L (ref 3.5–5.1)
Sodium: 141 mmol/L (ref 135–145)
Total Bilirubin: 0.8 mg/dL (ref 0.3–1.2)
Total Protein: 7.3 g/dL (ref 6.5–8.1)

## 2019-11-15 LAB — CBC WITH DIFFERENTIAL/PLATELET
Abs Immature Granulocytes: 0.13 10*3/uL — ABNORMAL HIGH (ref 0.00–0.07)
Basophils Absolute: 0.1 10*3/uL (ref 0.0–0.1)
Basophils Relative: 0 %
Eosinophils Absolute: 0.3 10*3/uL (ref 0.0–0.5)
Eosinophils Relative: 1 %
HCT: 38.1 % — ABNORMAL LOW (ref 39.0–52.0)
Hemoglobin: 12.6 g/dL — ABNORMAL LOW (ref 13.0–17.0)
Immature Granulocytes: 0 %
Lymphocytes Relative: 70 %
Lymphs Abs: 26.2 10*3/uL — ABNORMAL HIGH (ref 0.7–4.0)
MCH: 31 pg (ref 26.0–34.0)
MCHC: 33.1 g/dL (ref 30.0–36.0)
MCV: 93.8 fL (ref 80.0–100.0)
Monocytes Absolute: 4.7 10*3/uL — ABNORMAL HIGH (ref 0.1–1.0)
Monocytes Relative: 12 %
Neutro Abs: 6.3 10*3/uL (ref 1.7–7.7)
Neutrophils Relative %: 17 %
Platelets: 153 10*3/uL (ref 150–400)
RBC: 4.06 MIL/uL — ABNORMAL LOW (ref 4.22–5.81)
RDW: 13.6 % (ref 11.5–15.5)
WBC: 37.7 10*3/uL — ABNORMAL HIGH (ref 4.0–10.5)
nRBC: 0 % (ref 0.0–0.2)

## 2019-11-15 LAB — LACTATE DEHYDROGENASE: LDH: 159 U/L (ref 98–192)

## 2019-11-15 IMAGING — CT CT CHEST W/ CM
2 of 5 series · 12 of 36 positions shown, 15 images · IV contrast (APPLIED)
Comparison: [DATE] CT abdomen/pelvis. [DATE] CT chest,
abdomen and pelvis.

CLINICAL DATA: CLL, without treatment to date. Drenching night
sweats. Abdominal pain. Restaging.

EXAM:
CT CHEST, ABDOMEN, AND PELVIS WITH CONTRAST
TECHNIQUE: Multidetector CT imaging of the chest, abdomen and pelvis was
performed following the standard protocol during bolus
administration of intravenous contrast.
CONTRAST:  100mL OMNIPAQUE IOHEXOL 300 MG/ML  SOLN

[Series 2: cap with · axial · 0.76mm/px · z∈[-602,-82]mm · 9 of 130 slices shown, 12 images]
[im 13/130  mediastinal]
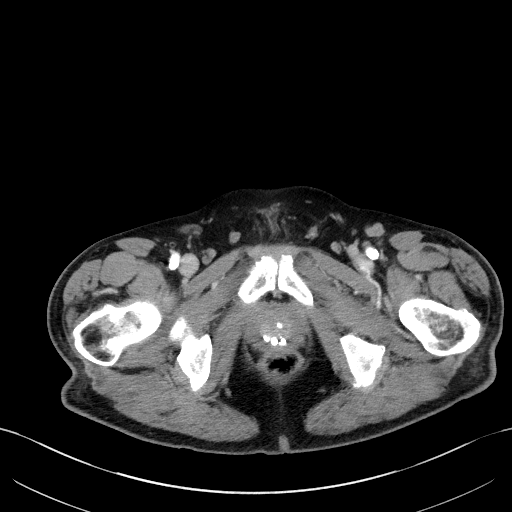
[im 13/130  lung]
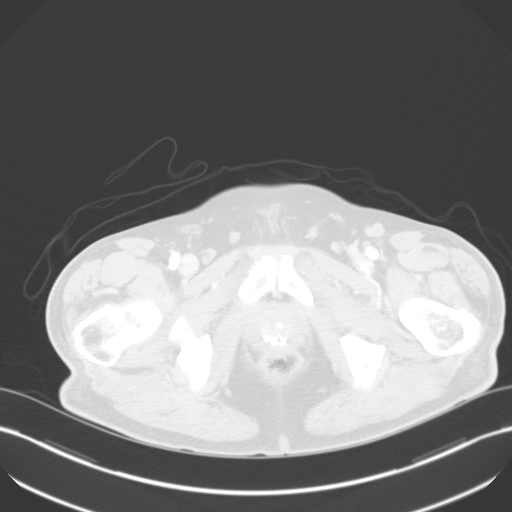
[im 26/130  lung]
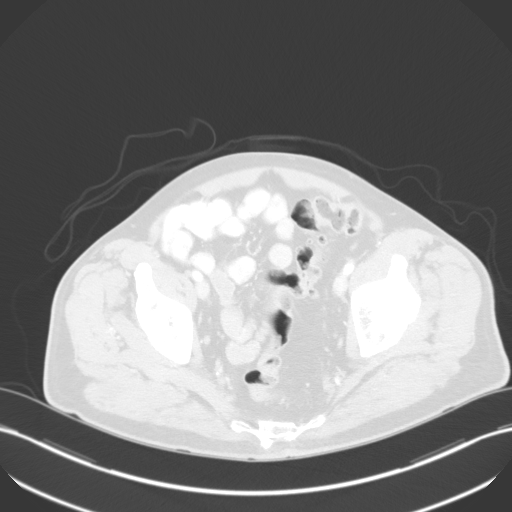
[im 39/130  lung]
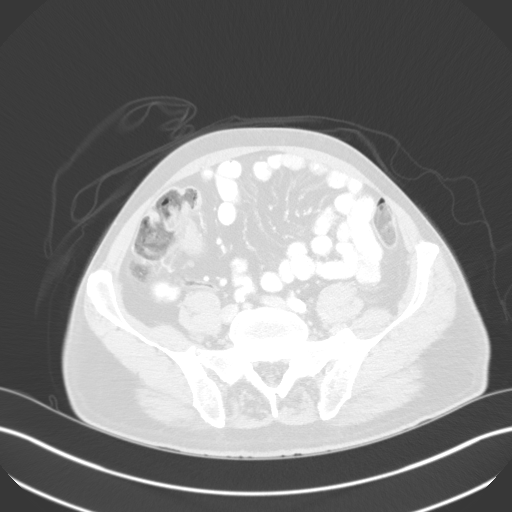
[im 52/130  lung]
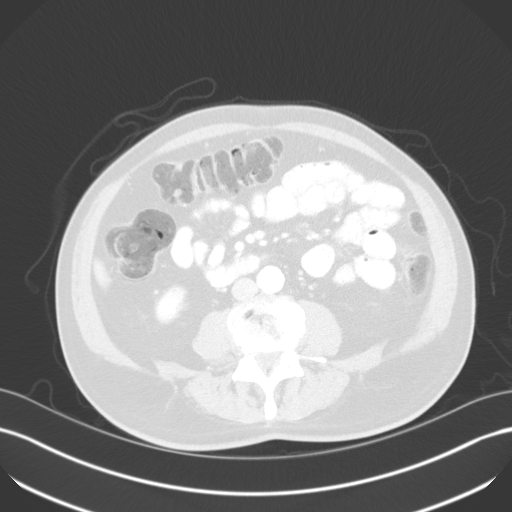
[im 65/130  mediastinal]
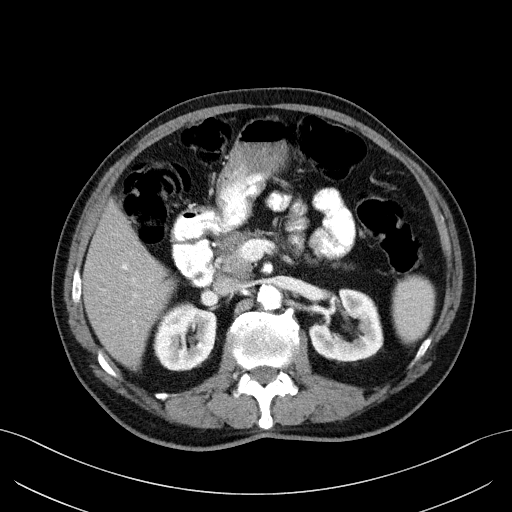
[im 65/130  lung]
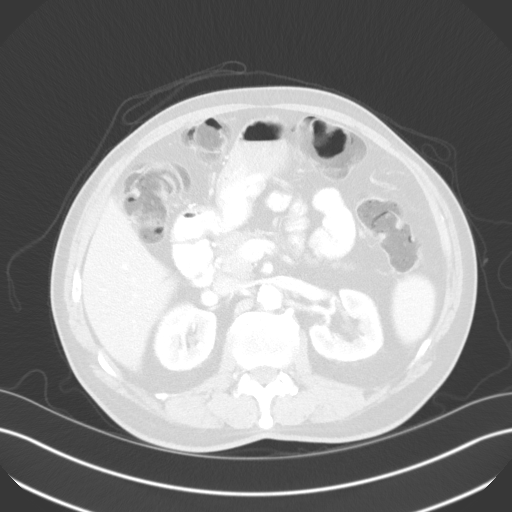
[im 78/130  lung]
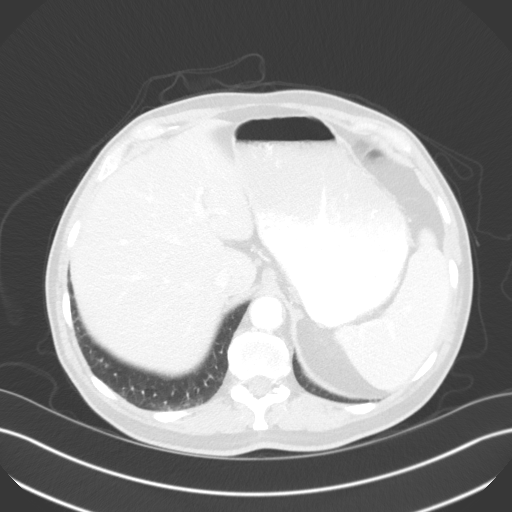
[im 91/130  lung]
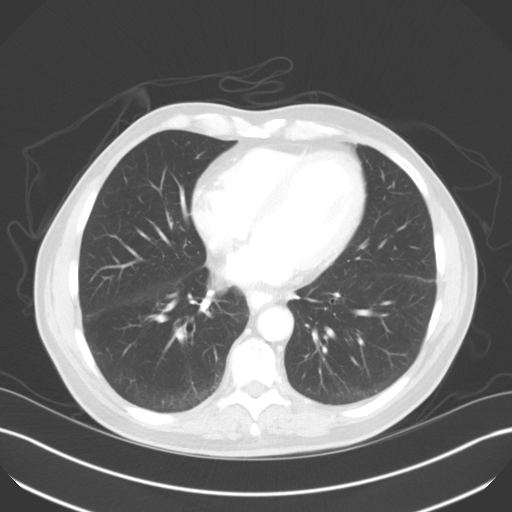
[im 104/130  lung]
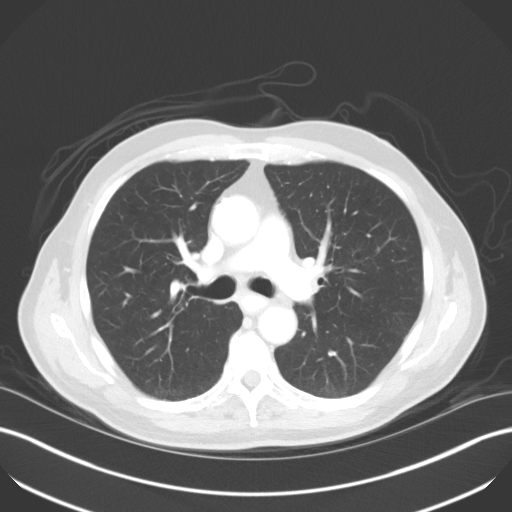
[im 117/130  mediastinal]
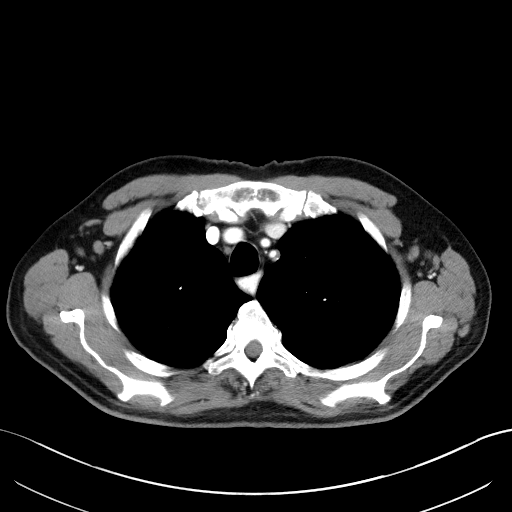
[im 117/130  lung]
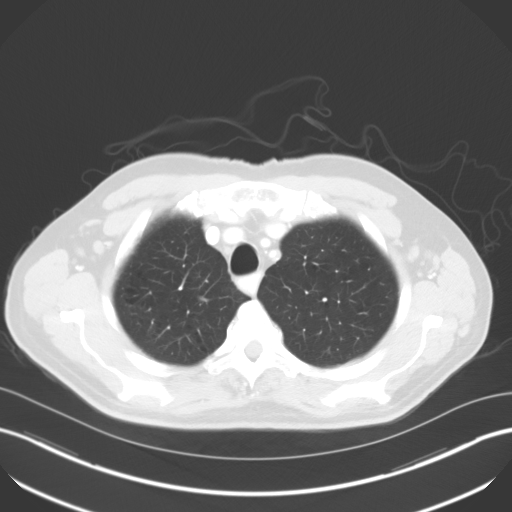

[Series 5: coronals · coronal · 0.76mm/px · 3 of 144 slices shown]
[im 29/144  lung]
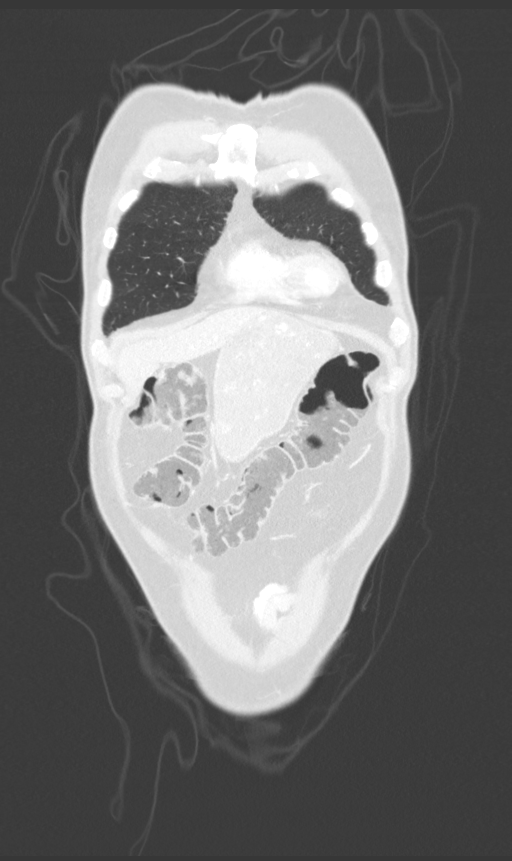
[im 58/144  lung]
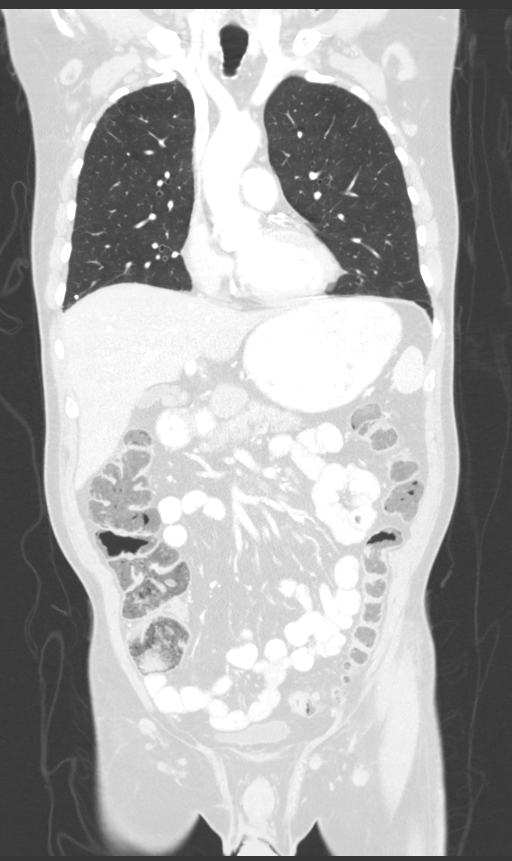
[im 86/144  lung]
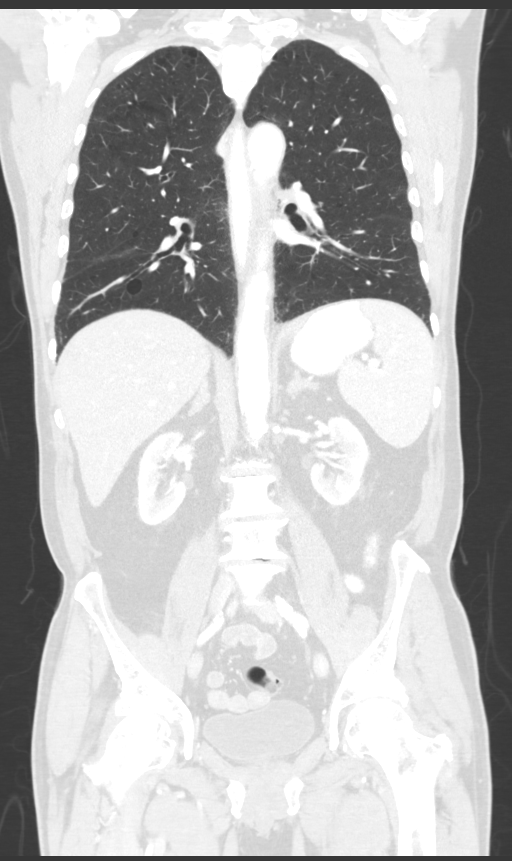

[12 of 36 positions shown; findings below may reference images not displayed]

FINDINGS: CT CHEST FINDINGS

Cardiovascular: Normal heart size. No significant pericardial
effusion/thickening. Three-vessel coronary atherosclerosis.
Atherosclerotic nonaneurysmal thoracic aorta. Normal caliber
pulmonary arteries. No central pulmonary emboli.

Mediastinum/Nodes: No discrete thyroid nodules. Oral contrast
retained throughout the a thoracic esophagus, suggesting dysmotility
or gastroesophageal reflux. Mildly to moderately enlarged bilateral
axillary lymph nodes, increased bilaterally. For example 1.4 cm
right axillary node (series 2/image 11), previously 1.1 cm. Left
axillary 1.9 cm node (series 2/image 80), previously 1.5 cm. No
pathologically enlarged mediastinal or hilar nodes.

Lungs/Pleura: No pneumothorax. No pleural effusion. Mild
centrilobular and paraseptal emphysema. No acute consolidative
airspace disease or lung masses. Scattered tiny calcified right
granulomas are stable. Tiny 2 mm posterior left upper lobe nodule
(series 4/image 42) is stable and considered benign. No new
significant pulmonary nodules.

Musculoskeletal: No aggressive appearing focal osseous lesions. Mild
thoracic spondylosis.

CT ABDOMEN PELVIS FINDINGS

Hepatobiliary: Normal liver with no liver mass. Normal gallbladder
with no radiopaque cholelithiasis. No biliary ductal dilatation.

Pancreas: Normal, with no mass or duct dilation.

Spleen: Normal size. No mass.

Adrenals/Urinary Tract: No discrete adrenal nodules. No
hydronephrosis. Subcentimeter simple upper left renal cortical
lesion is too small to characterize and not significantly changed,
considered benign. No new renal lesions. Normal bladder.

Stomach/Bowel: Small hiatal hernia. Otherwise normal stomach. Normal
caliber small bowel with no small bowel wall thickening. Normal
appendix. Mild sigmoid diverticulosis, with no large bowel wall
thickening or acute pericolonic fat stranding.

Vascular/Lymphatic: Atherosclerotic nonaneurysmal abdominal aorta.
Patent portal, splenic, hepatic and renal veins. Enlarged porta
hepatis nodes up to 1.6 cm (series 2/image 61), increased from
cm on [DATE] CT. Enlarged 2.4 cm portacaval node (series 2/image
59), increased from 1.6 cm. Newly mildly enlarged 1.0 cm high left
para-aortic node (series 2/image 62). New mild bilateral inguinal
lymphadenopathy up to 1.2 cm on the left (series 2/image 27).

Reproductive: Mildly enlarged prostate with nonspecific coarse
internal prostatic calcifications.

Other: No pneumoperitoneum, ascites or focal fluid collection.

Musculoskeletal: No aggressive appearing focal osseous lesions.
Moderate lumbar spondylosis.
IMPRESSION: 1. Bilateral axillary, porta hepatis/portacaval, left para-aortic
and bilateral inguinal lymphadenopathy, all increased since [L1] CT
studies, compatible with lymphoproliferative disorder.
2. Chronic findings include: Aortic Atherosclerosis ([L1]-[L1]).
Three-vessel coronary atherosclerosis. Small hiatal hernia. Mild
sigmoid diverticulosis. Mildly enlarged prostate.

## 2019-11-15 IMAGING — CT CT ABD-PELV W/ CM
2 of 5 series · 12 of 36 positions shown, 15 images · IV contrast (APPLIED)
Comparison: [DATE] CT abdomen/pelvis. [DATE] CT chest,
abdomen and pelvis.

CLINICAL DATA: CLL, without treatment to date. Drenching night
sweats. Abdominal pain. Restaging.

EXAM:
CT CHEST, ABDOMEN, AND PELVIS WITH CONTRAST
TECHNIQUE: Multidetector CT imaging of the chest, abdomen and pelvis was
performed following the standard protocol during bolus
administration of intravenous contrast.
CONTRAST:  100mL OMNIPAQUE IOHEXOL 300 MG/ML  SOLN

[Series 2: cap with · axial · 0.76mm/px · z∈[-602,-82]mm · 9 of 130 slices shown, 12 images]
[im 13/130  mediastinal]
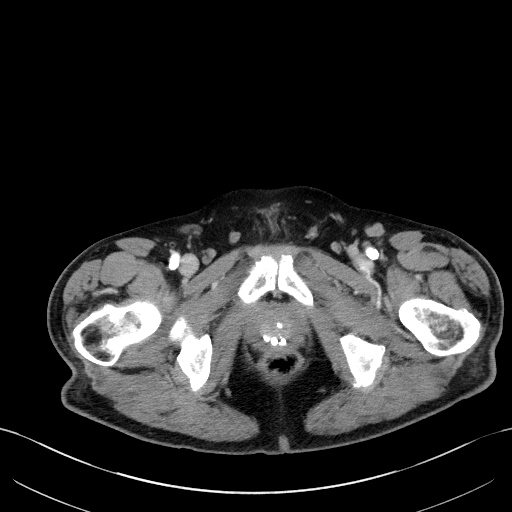
[im 13/130  lung]
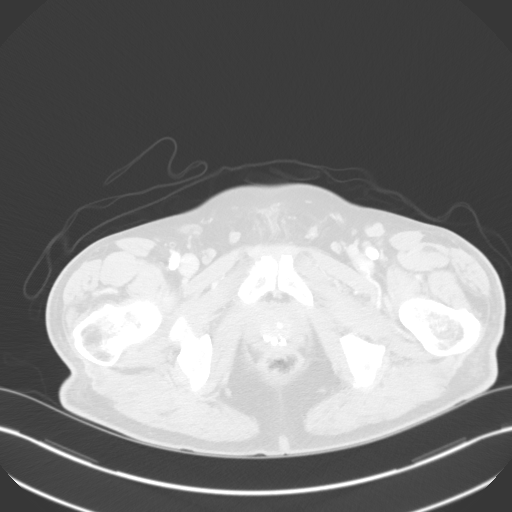
[im 26/130  lung]
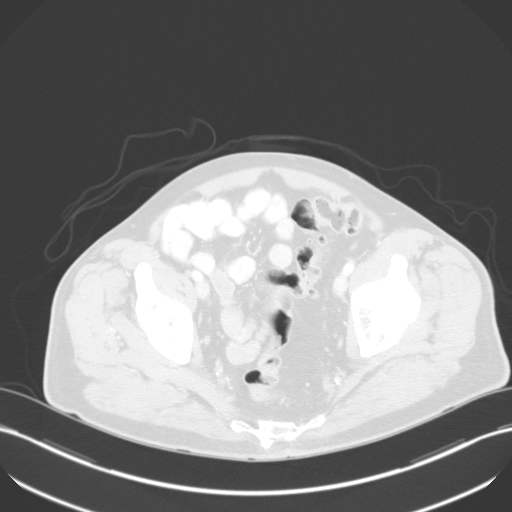
[im 39/130  lung]
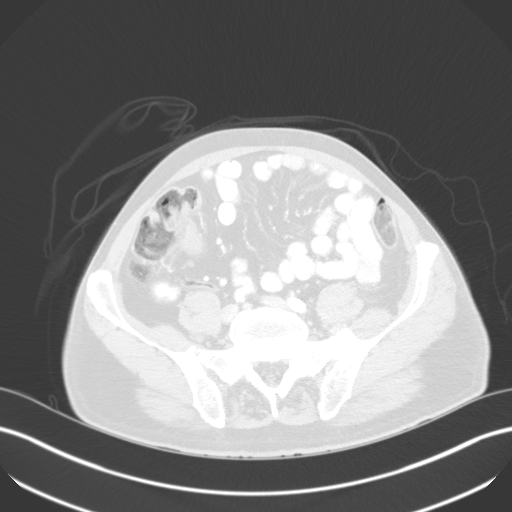
[im 52/130  lung]
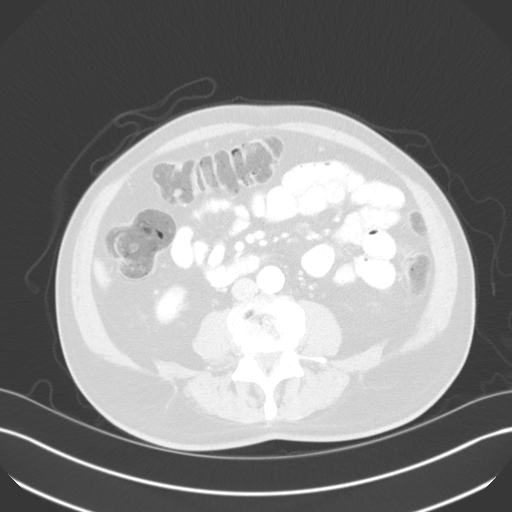
[im 65/130  mediastinal]
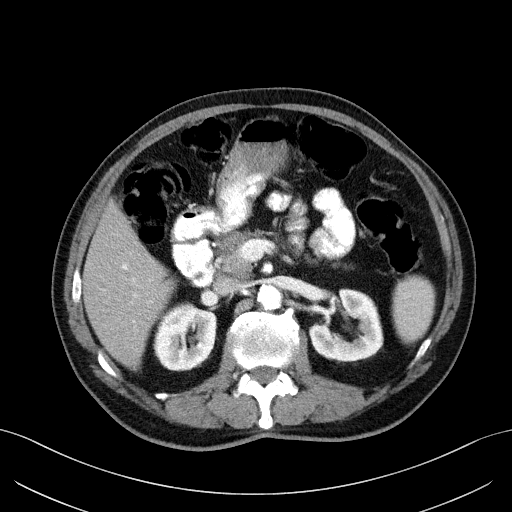
[im 65/130  lung]
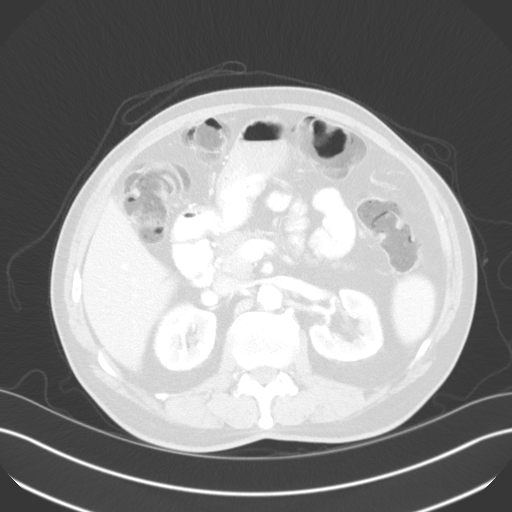
[im 78/130  lung]
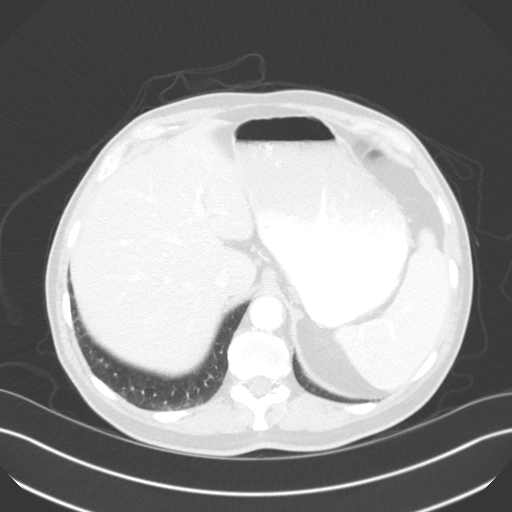
[im 91/130  lung]
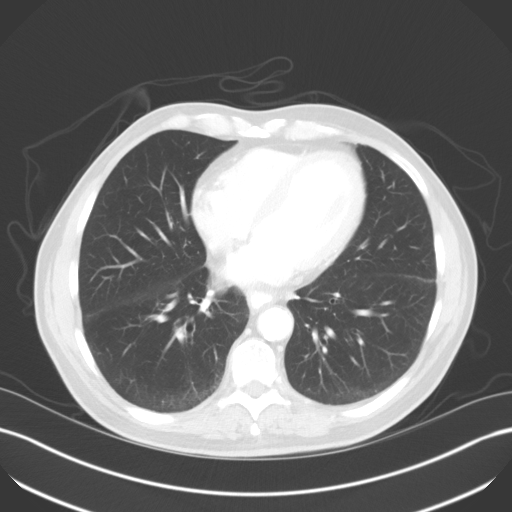
[im 104/130  lung]
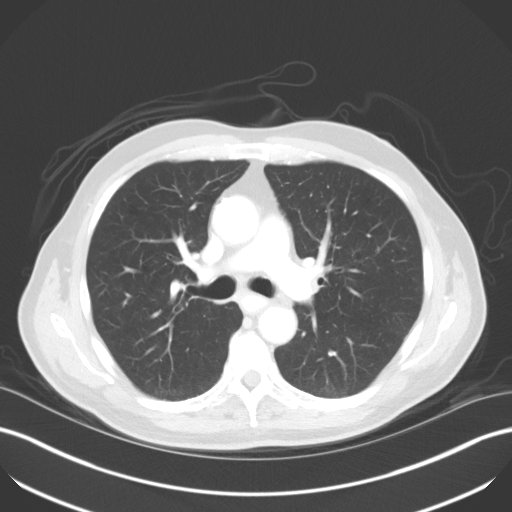
[im 117/130  mediastinal]
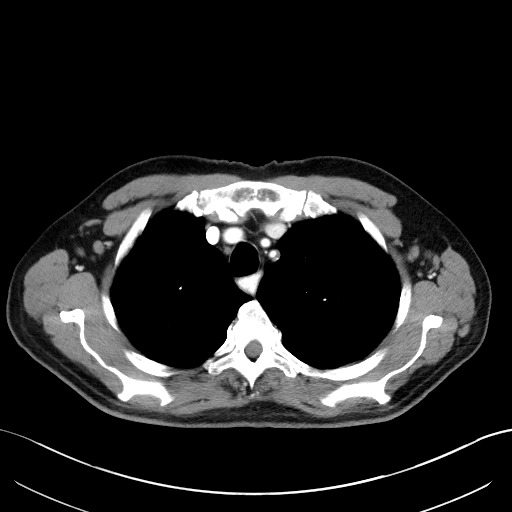
[im 117/130  lung]
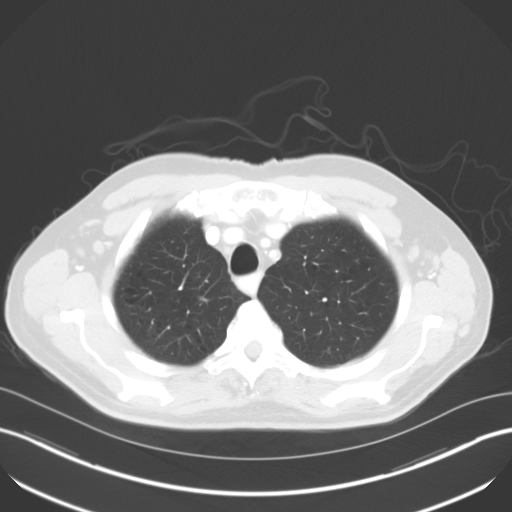

[Series 5: coronals · coronal · 0.76mm/px · 3 of 144 slices shown]
[im 29/144  lung]
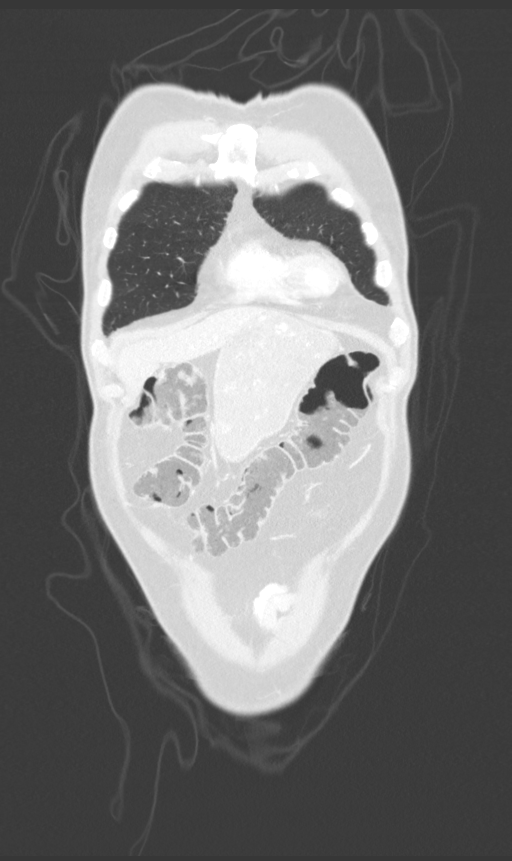
[im 58/144  lung]
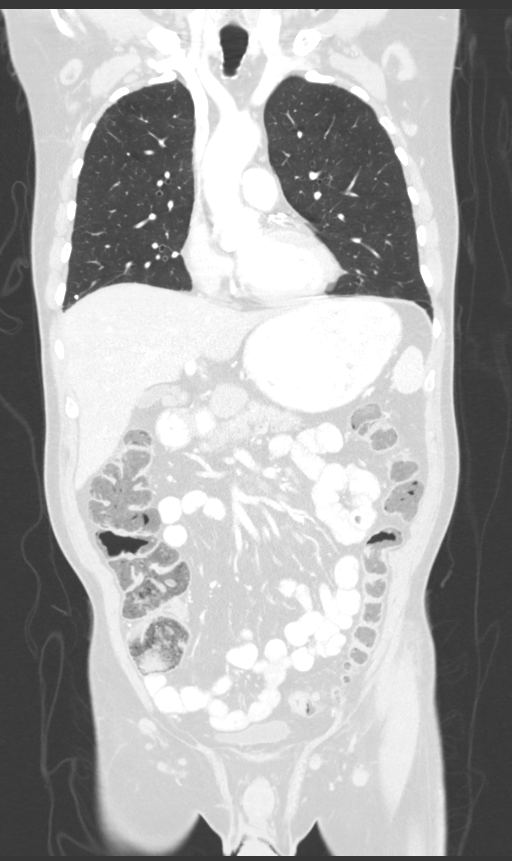
[im 86/144  lung]
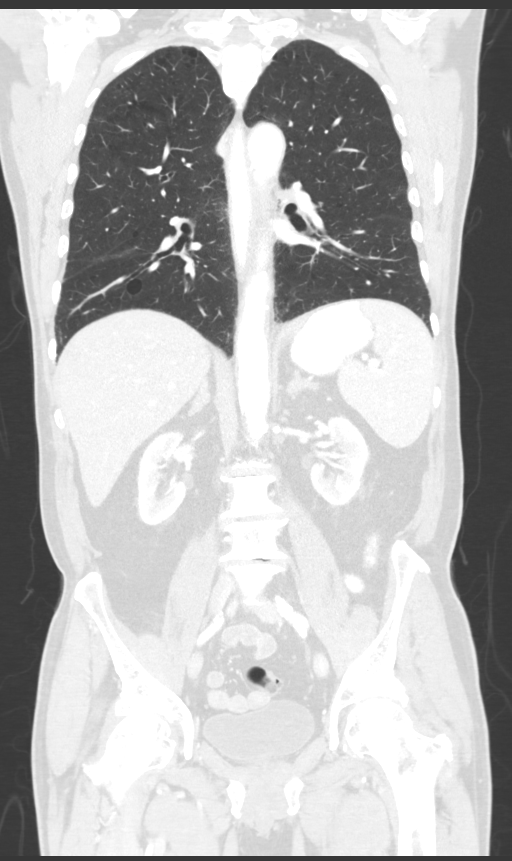

[12 of 36 positions shown; findings below may reference images not displayed]

FINDINGS: CT CHEST FINDINGS

Cardiovascular: Normal heart size. No significant pericardial
effusion/thickening. Three-vessel coronary atherosclerosis.
Atherosclerotic nonaneurysmal thoracic aorta. Normal caliber
pulmonary arteries. No central pulmonary emboli.

Mediastinum/Nodes: No discrete thyroid nodules. Oral contrast
retained throughout the a thoracic esophagus, suggesting dysmotility
or gastroesophageal reflux. Mildly to moderately enlarged bilateral
axillary lymph nodes, increased bilaterally. For example 1.4 cm
right axillary node (series 2/image 11), previously 1.1 cm. Left
axillary 1.9 cm node (series 2/image 80), previously 1.5 cm. No
pathologically enlarged mediastinal or hilar nodes.

Lungs/Pleura: No pneumothorax. No pleural effusion. Mild
centrilobular and paraseptal emphysema. No acute consolidative
airspace disease or lung masses. Scattered tiny calcified right
granulomas are stable. Tiny 2 mm posterior left upper lobe nodule
(series 4/image 42) is stable and considered benign. No new
significant pulmonary nodules.

Musculoskeletal: No aggressive appearing focal osseous lesions. Mild
thoracic spondylosis.

CT ABDOMEN PELVIS FINDINGS

Hepatobiliary: Normal liver with no liver mass. Normal gallbladder
with no radiopaque cholelithiasis. No biliary ductal dilatation.

Pancreas: Normal, with no mass or duct dilation.

Spleen: Normal size. No mass.

Adrenals/Urinary Tract: No discrete adrenal nodules. No
hydronephrosis. Subcentimeter simple upper left renal cortical
lesion is too small to characterize and not significantly changed,
considered benign. No new renal lesions. Normal bladder.

Stomach/Bowel: Small hiatal hernia. Otherwise normal stomach. Normal
caliber small bowel with no small bowel wall thickening. Normal
appendix. Mild sigmoid diverticulosis, with no large bowel wall
thickening or acute pericolonic fat stranding.

Vascular/Lymphatic: Atherosclerotic nonaneurysmal abdominal aorta.
Patent portal, splenic, hepatic and renal veins. Enlarged porta
hepatis nodes up to 1.6 cm (series 2/image 61), increased from
cm on [DATE] CT. Enlarged 2.4 cm portacaval node (series 2/image
59), increased from 1.6 cm. Newly mildly enlarged 1.0 cm high left
para-aortic node (series 2/image 62). New mild bilateral inguinal
lymphadenopathy up to 1.2 cm on the left (series 2/image 27).

Reproductive: Mildly enlarged prostate with nonspecific coarse
internal prostatic calcifications.

Other: No pneumoperitoneum, ascites or focal fluid collection.

Musculoskeletal: No aggressive appearing focal osseous lesions.
Moderate lumbar spondylosis.
IMPRESSION: 1. Bilateral axillary, porta hepatis/portacaval, left para-aortic
and bilateral inguinal lymphadenopathy, all increased since [L1] CT
studies, compatible with lymphoproliferative disorder.
2. Chronic findings include: Aortic Atherosclerosis ([L1]-[L1]).
Three-vessel coronary atherosclerosis. Small hiatal hernia. Mild
sigmoid diverticulosis. Mildly enlarged prostate.

## 2019-11-15 MED ORDER — SODIUM CHLORIDE (PF) 0.9 % IJ SOLN
INTRAMUSCULAR | Status: AC
  Filled 2019-11-15: qty 50

## 2019-11-15 MED ORDER — IOHEXOL 300 MG/ML  SOLN
100.0000 mL | Freq: Once | INTRAMUSCULAR | Status: AC | PRN
  Administered 2019-11-15: 100 mL via INTRAVENOUS

## 2019-11-16 LAB — URINE CULTURE: Culture: NO GROWTH

## 2019-11-27 ENCOUNTER — Inpatient Hospital Stay (HOSPITAL_BASED_OUTPATIENT_CLINIC_OR_DEPARTMENT_OTHER): Payer: Medicare HMO | Admitting: Hematology

## 2019-11-27 DIAGNOSIS — C9110 Chronic lymphocytic leukemia of B-cell type not having achieved remission: Secondary | ICD-10-CM

## 2019-11-27 NOTE — Progress Notes (Signed)
HEMATOLOGY/ONCOLOGY CLINIC NOTE  Date of Service: 11/27/2019  Patient Care Team: Tony Reeve, DO as PCP - General (Osteopathic Medicine)  CHIEF COMPLAINTS/PURPOSE OF CONSULTATION:  Chronic Lymphocytic Leukemia   HISTORY OF PRESENTING ILLNESS:   Tony Banks is a wonderful 79 y.o. male who has been previously followed by my colleague Dr. 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 He is accompanied today by his wife. The pt reports that he is doing well overall. Verbal consent has been given by the pt for a clinical observer, Tony Banks, to be present.    The pt was diagnosed with CLL in October 2018, following a Peripheral blood flow cytometry and cytogenetics report. 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 The pt notes that his CLL was picked up off of routine labs and was not prompted by any associated symptoms.   The pt reports that he has not had any symptoms or concerns in the last 3 months he denies any constitutional symptoms or noticing any new lumps or bumps.    Most recent lab results (03/22/18) of CBC w/diff, CMP is as follows: all values are WNL except for WBC at 18.4k, Lymphs abs at 11.9k, Glucose at 128. LDH 03/22/18 is normal at 160 QIG 03/22/18 is pending  On review of systems, pt reports good energy levels, and denies fevers, chills, night sweats, unexpected weight loss, noticing any new lumps or bumps, abdominal pains, and any other symptoms.   Interval History:  I connected with  Tony Banks on 11/27/19 by telephone and verified that I am speaking with the correct person using two identifiers.   I discussed the limitations of evaluation and management by telemedicine. The patient expressed understanding and agreed to proceed.  Other persons participating in the visit and their role in the encounter:         -Tony Banks, Medical Scribe  Patient's location:  Home Provider's location: [place] at [place] returns today for management and evaluation of his CLL. The patient's last visit with [place] was on 11/15/2019. The pt reports that he is doing well overall.  The pt reports that he is no longer feeling fullness or discomfort in his lower abdomen. He is also having no difficulty passing urine and his urine is clear at this time. Pt has an upcoming appointment with Tony Banks.   Of note since the patient's last visit, pt has had CT C/A/P ([id number]) completed on 11/15/2019 with results revealing "1. Bilateral axillary, porta hepatis/portacaval, left para-aortic and bilateral inguinal lymphadenopathy, all increased since 2018 CT studies, compatible with lymphoproliferative disorder. 2. Chronic findings include: Aortic Atherosclerosis (ICD10-I70.0). Three-vessel coronary atherosclerosis. Small hiatal hernia. Mild sigmoid diverticulosis. Mildly enlarged prostate."  Lab results is as follows:  11/15/2019 Urine Culture is Negative  11/15/2019 Urinalysis shows all values are WNL except for Leukocytes are "Trace".  On review of systems, pt denies urinary retention, discolored urine, abdominal fullness/pain and any other symptoms.   MEDICAL HISTORY:  Past Medical History:  Diagnosis Date  . Aortic atherosclerosis ([place])   . CLL (chronic lymphocytic leukemia) ([place])   . Constipation    severe  . Diabetes mellitus without complication ([place])   . Diverticulosis   . HLD (hyperlipidemia)   . Hypertension   . Nephrolithiasis     SURGICAL HISTORY: Past Surgical History:  Procedure Laterality Date  . FINGER FRACTURE SURGERY Right  middle finger  . SHOULDER ARTHROSCOPY Bilateral   . VASECTOMY      SOCIAL HISTORY: Social History   Socioeconomic History  . Marital status: Married    Spouse name: Tony Banks  . Number of children: 3  . Years of education: 84  . Highest education level: Some college, no degree  Occupational History  .  Occupation: retired  Tobacco Use  . Smoking status: Former Smoker    Quit date: 07/31/1977    Years since quitting: 42.3  . Smokeless tobacco: Never Used  . Tobacco comment: quit 30 yrs ago  Substance and Sexual Activity  . Alcohol use: No  . Drug use: No  . Sexual activity: Never    Birth control/protection: Abstinence  Other Topics Concern  . Not on file  Social History Narrative   Drinks 1-2 cups of coffee.   Social Determinants of Health   Financial Resource Strain:   . Difficulty of Paying Living Expenses:   Food Insecurity:   . Worried About Charity fundraiser in the Last Year:   . Arboriculturist in the Last Year:   Transportation Needs:   . Film/video editor (Medical):   Marland Kitchen Lack of Transportation (Non-Medical):   Physical Activity:   . Days of Exercise per Week:   . Minutes of Exercise per Session:   Stress:   . Feeling of Stress :   Social Connections:   . Frequency of Communication with Friends and Family:   . Frequency of Social Gatherings with Friends and Family:   . Attends Religious Services:   . Active Member of Clubs or Organizations:   . Attends Archivist Meetings:   Marland Kitchen Marital Status:   Intimate Partner Violence:   . Fear of Current or Ex-Partner:   . Emotionally Abused:   Marland Kitchen Physically Abused:   . Sexually Abused:     FAMILY HISTORY: Family History  Problem Relation Age of Onset  . Heart disease Mother   . Stroke Mother   . Cancer Father   . Stroke Maternal Grandfather   . Colon cancer Neg Hx   . Pancreatic cancer Neg Hx   . Rectal cancer Neg Hx   . Stomach cancer Neg Hx     ALLERGIES:  is allergic to shellfish allergy.  MEDICATIONS:  Current Outpatient Medications  Medication Sig Dispense Refill  . ACCU-CHEK SOFTCLIX LANCETS lancets Use as instructed 100 each 12  . AMBULATORY NON FORMULARY MEDICATION Accu-chek aviva plus test strips and accu-chek softclix lancets, use to check blood sugar up to four times a day. Dx  Type 2 diabetes. 100 Units 3  . atorvastatin (LIPITOR) 10 MG tablet Take 1 tablet (10 mg total) by mouth daily. Due for lab work 90 tablet 3  . enalapril (VASOTEC) 2.5 MG tablet Take 1 tablet (2.5 mg total) by mouth daily. 90 tablet 3  . glucose blood (ACCU-CHEK AVIVA PLUS) test strip 1 each by Other route as needed for other (daily or up to 4 times per day as directed). Use as instructed 200 each 99  . metFORMIN (GLUCOPHAGE) 500 MG tablet Take 2 tablets (1,000 mg total) by mouth daily with breakfast. 180 tablet 3  . Multiple Vitamin (MULTIVITAMIN WITH MINERALS) TABS tablet Take 1 tablet daily by mouth.    . Omega-3 Fatty Acids (FISH OIL PO) Take by mouth.    . polyethylene glycol (MIRALAX) packet Take 17 g by mouth daily. 28 each 0   No current facility-administered  medications for this visit.    REVIEW OF SYSTEMS:   A 10+ POINT REVIEW OF SYSTEMS WAS OBTAINED including neurology, dermatology, psychiatry, cardiac, respiratory, lymph, extremities, GI, GU, Musculoskeletal, constitutional, breasts, reproductive, HEENT.  All pertinent positives are noted in the HPI.  All others are negative.   PHYSICAL EXAMINATION: ECOG PERFORMANCE STATUS: 0 - Asymptomatic  . There were no vitals filed for this visit. There were no vitals filed for this visit. .There is no height or weight on file to calculate BMI.  Telehealth visit   LABORATORY DATA:  I have reviewed the data as listed  . CBC Latest Ref Rng & Units 11/15/2019 05/02/2019 09/20/2018  WBC 4.0 - 10.5 K/uL 37.7(H) 27.1(H) 20.2(H)  Hemoglobin 13.0 - 17.0 g/dL 12.6(L) 12.3(L) 13.1  Hematocrit 39.0 - 52.0 % 38.1(L) 37.6(L) 40.0  Platelets 150 - 400 K/uL 153 163 160   . CBC    Component Value Date/Time   WBC 37.7 (H) 11/15/2019 0756   RBC 4.06 (L) 11/15/2019 0756   HGB 12.6 (L) 11/15/2019 0756   HGB 13.1 03/22/2018 0944   HGB 12.8 (L) 05/29/2017 1141   HCT 38.1 (L) 11/15/2019 0756   HCT 38.7 05/29/2017 1141   PLT 153 11/15/2019 0756    PLT 146 03/22/2018 0944   PLT 151 05/29/2017 1141   MCV 93.8 11/15/2019 0756   MCV 91.3 05/29/2017 1141   MCH 31.0 11/15/2019 0756   MCHC 33.1 11/15/2019 0756   RDW 13.6 11/15/2019 0756   RDW 13.8 05/29/2017 1141   LYMPHSABS 26.2 (H) 11/15/2019 0756   LYMPHSABS 8.8 (H) 05/29/2017 1141   MONOABS 4.7 (H) 11/15/2019 0756   MONOABS 0.7 05/29/2017 1141   EOSABS 0.3 11/15/2019 0756   EOSABS 0.1 05/29/2017 1141   BASOSABS 0.1 11/15/2019 0756   BASOSABS 0.0 05/29/2017 1141    . CMP Latest Ref Rng & Units 11/15/2019 09/23/2019 05/02/2019  Glucose 70 - 99 mg/dL 140(H) 135(H) 156(H)  BUN 8 - 23 mg/dL _0 Creatinine 0.61 - 1.24 mg/dL 1.13 0.96 1.12  Sodium 135 - 145 mmol/L 141 140 143  Potassium 3.5 - 5.1 mmol/L 4.3 4.5 4.2  Chloride 98 - 111 mmol/L 107 106 111  CO2 22 - 32 mmol/L _1 Calcium 8.9 - 10.3 mg/dL 9.1 9.5 9.2  Total Protein 6.5 - 8.1 g/dL 7.3 7.0 7.1  Total Bilirubin 0.3 - 1.2 mg/dL 0.8 0.6 0.7  Alkaline Phos 38 - 126 U/L 79 - 80  AST 15 - 41 U/L _2 ALT 0 - 44 U/L _3 06/08/17 FISH CLL Prognostic Panel:    06/08/17 Cytogenetics:   05/29/17 Peripheral blood flow cytometry:    RADIOGRAPHIC STUDIES: I have personally reviewed the radiological images as listed and agreed with the findings in the report. CT CHEST W CONTRAST  Result Date: 11/15/2019 CLINICAL DATA:  CLL, without treatment to date. Drenching night sweats. Abdominal pain. Restaging. EXAM: CT CHEST, ABDOMEN, AND PELVIS WITH CONTRAST TECHNIQUE: Multidetector CT imaging of the chest, abdomen and pelvis was performed following the standard protocol during bolus administration of intravenous contrast. CONTRAST:  149m OMNIPAQUE IOHEXOL 300 MG/ML  SOLN COMPARISON:  07/21/2017 CT abdomen/pelvis. 06/20/2017 CT chest, abdomen and pelvis. FINDINGS: CT CHEST FINDINGS Cardiovascular: Normal heart size. No significant pericardial effusion/thickening. Three-vessel coronary atherosclerosis.  Atherosclerotic nonaneurysmal thoracic aorta. Normal caliber pulmonary arteries. No central pulmonary emboli. Mediastinum/Nodes: No discrete thyroid nodules. Oral contrast retained throughout the a thoracic esophagus,  suggesting dysmotility or gastroesophageal reflux. Mildly to moderately enlarged bilateral axillary lymph nodes, increased bilaterally. For example 1.4 cm right axillary node (series 2/image 11), previously 1.1 cm. Left axillary 1.9 cm node (series 2/image 80), previously 1.5 cm. No pathologically enlarged mediastinal or hilar nodes. Lungs/Pleura: No pneumothorax. No pleural effusion. Mild centrilobular and paraseptal emphysema. No acute consolidative airspace disease or lung masses. Scattered tiny calcified right granulomas are stable. Tiny 2 mm posterior left upper lobe nodule (series 4/image 42) is stable and considered benign. No new significant pulmonary nodules. Musculoskeletal: No aggressive appearing focal osseous lesions. Mild thoracic spondylosis. CT ABDOMEN PELVIS FINDINGS Hepatobiliary: Normal liver with no liver mass. Normal gallbladder with no radiopaque cholelithiasis. No biliary ductal dilatation. Pancreas: Normal, with no mass or duct dilation. Spleen: Normal size. No mass. Adrenals/Urinary Tract: No discrete adrenal nodules. No hydronephrosis. Subcentimeter simple upper left renal cortical lesion is too small to characterize and not significantly changed, considered benign. No new renal lesions. Normal bladder. Stomach/Bowel: Small hiatal hernia. Otherwise normal stomach. Normal caliber small bowel with no small bowel wall thickening. Normal appendix. Mild sigmoid diverticulosis, with no large bowel wall thickening or acute pericolonic fat stranding. Vascular/Lymphatic: Atherosclerotic nonaneurysmal abdominal aorta. Patent portal, splenic, hepatic and renal veins. Enlarged porta hepatis nodes up to 1.6 cm (series 2/image 61), increased from 1.2 cm on 07/21/2017 CT. Enlarged 2.4 cm  portacaval node (series 2/image 59), increased from 1.6 cm. Newly mildly enlarged 1.0 cm high left para-aortic node (series 2/image 62). New mild bilateral inguinal lymphadenopathy up to 1.2 cm on the left (series 2/image 27). Reproductive: Mildly enlarged prostate with nonspecific coarse internal prostatic calcifications. Other: No pneumoperitoneum, ascites or focal fluid collection. Musculoskeletal: No aggressive appearing focal osseous lesions. Moderate lumbar spondylosis. IMPRESSION: 1. Bilateral axillary, porta hepatis/portacaval, left para-aortic and bilateral inguinal lymphadenopathy, all increased since 2018 CT studies, compatible with lymphoproliferative disorder. 2. Chronic findings include: Aortic Atherosclerosis (ICD10-I70.0). Three-vessel coronary atherosclerosis. Small hiatal hernia. Mild sigmoid diverticulosis. Mildly enlarged prostate. Electronically Signed   By: Ilona Sorrel M.D.   On: 11/15/2019 17:23   CT ABDOMEN PELVIS W CONTRAST  Result Date: 11/15/2019 CLINICAL DATA:  CLL, without treatment to date. Drenching night sweats. Abdominal pain. Restaging. EXAM: CT CHEST, ABDOMEN, AND PELVIS WITH CONTRAST TECHNIQUE: Multidetector CT imaging of the chest, abdomen and pelvis was performed following the standard protocol during bolus administration of intravenous contrast. CONTRAST:  143m OMNIPAQUE IOHEXOL 300 MG/ML  SOLN COMPARISON:  07/21/2017 CT abdomen/pelvis. 06/20/2017 CT chest, abdomen and pelvis. FINDINGS: CT CHEST FINDINGS Cardiovascular: Normal heart size. No significant pericardial effusion/thickening. Three-vessel coronary atherosclerosis. Atherosclerotic nonaneurysmal thoracic aorta. Normal caliber pulmonary arteries. No central pulmonary emboli. Mediastinum/Nodes: No discrete thyroid nodules. Oral contrast retained throughout the a thoracic esophagus, suggesting dysmotility or gastroesophageal reflux. Mildly to moderately enlarged bilateral axillary lymph nodes, increased bilaterally.  For example 1.4 cm right axillary node (series 2/image 11), previously 1.1 cm. Left axillary 1.9 cm node (series 2/image 80), previously 1.5 cm. No pathologically enlarged mediastinal or hilar nodes. Lungs/Pleura: No pneumothorax. No pleural effusion. Mild centrilobular and paraseptal emphysema. No acute consolidative airspace disease or lung masses. Scattered tiny calcified right granulomas are stable. Tiny 2 mm posterior left upper lobe nodule (series 4/image 42) is stable and considered benign. No new significant pulmonary nodules. Musculoskeletal: No aggressive appearing focal osseous lesions. Mild thoracic spondylosis. CT ABDOMEN PELVIS FINDINGS Hepatobiliary: Normal liver with no liver mass. Normal gallbladder with no radiopaque cholelithiasis. No biliary ductal dilatation. Pancreas: Normal, with no mass  or duct dilation. Spleen: Normal size. No mass. Adrenals/Urinary Tract: No discrete adrenal nodules. No hydronephrosis. Subcentimeter simple upper left renal cortical lesion is too small to characterize and not significantly changed, considered benign. No new renal lesions. Normal bladder. Stomach/Bowel: Small hiatal hernia. Otherwise normal stomach. Normal caliber small bowel with no small bowel wall thickening. Normal appendix. Mild sigmoid diverticulosis, with no large bowel wall thickening or acute pericolonic fat stranding. Vascular/Lymphatic: Atherosclerotic nonaneurysmal abdominal aorta. Patent portal, splenic, hepatic and renal veins. Enlarged porta hepatis nodes up to 1.6 cm (series 2/image 61), increased from 1.2 cm on 07/21/2017 CT. Enlarged 2.4 cm portacaval node (series 2/image 59), increased from 1.6 cm. Newly mildly enlarged 1.0 cm high left para-aortic node (series 2/image 62). New mild bilateral inguinal lymphadenopathy up to 1.2 cm on the left (series 2/image 27). Reproductive: Mildly enlarged prostate with nonspecific coarse internal prostatic calcifications. Other: No pneumoperitoneum,  ascites or focal fluid collection. Musculoskeletal: No aggressive appearing focal osseous lesions. Moderate lumbar spondylosis. IMPRESSION: 1. Bilateral axillary, porta hepatis/portacaval, left para-aortic and bilateral inguinal lymphadenopathy, all increased since 2018 CT studies, compatible with lymphoproliferative disorder. 2. Chronic findings include: Aortic Atherosclerosis (ICD10-I70.0). Three-vessel coronary atherosclerosis. Small hiatal hernia. Mild sigmoid diverticulosis. Mildly enlarged prostate. Electronically Signed   By: Ilona Sorrel M.D.   On: 11/15/2019 17:23    ASSESSMENT & PLAN:  79 y.o. male with  1. Chronic Lymphocytic Leukemia Rai 1  06/08/17 CLL FISH Prognostic panel negative for del17p, del11q, del13q, indicating intermediate risk  PLAN: -Discussed pt labwork, 11/15/19; Urine Culture is Negative. Urinalysis shows all values are WNL except for "Trace" Leukocytes. -Discussed 11/15/2019 CT C/A/P ([id number]) revealed gradual lymph node increase compared to 2018 CT. No major lymphoma progression. Mildly enlarged prostate.   -The pt shows no clinical or lab progression of his CLL at this time. -No indication to initiate treatment at this time.   -Advised pt that his prostate was enlarged on scan and could explain his previous urinary symptoms -Recommend pt get repeat PSA levels and prostate examination with Tony Banks  -Recommend pt f/u with Tony Banks as scheduled -Advised pt to contact PCP if urinary symptoms return -Will continue to watch with labs and clinic visits every 6 months  -Will see the pt back in 6 months with labs    FOLLOW UP: RTC with Dr Irene Limbo with labs in 6 months    The total time spent in the appt was 20 minutes and more than 50% was on counseling and direct patient cares.  All of the patient's questions were answered with apparent satisfaction. The patient knows to call the clinic with any problems, questions or concerns.    Sullivan Lone MD [place]  AAHIVMS [facility] Hematology/Oncology Physician [facility]  (Office):       [phone] (Work cell):  [phone] (Fax):           [phone]  11/27/2019 4:32 PM  I, Tony Banks, am acting as a scribe for Dr. Sullivan Lone.   .I have reviewed the above documentation for accuracy and completeness, and I agree with the above. [name] MD

## 2020-03-07 ENCOUNTER — Other Ambulatory Visit: Payer: Self-pay | Admitting: Osteopathic Medicine

## 2020-03-24 ENCOUNTER — Ambulatory Visit (INDEPENDENT_AMBULATORY_CARE_PROVIDER_SITE_OTHER): Payer: Medicare HMO | Admitting: Osteopathic Medicine

## 2020-03-24 ENCOUNTER — Encounter: Payer: Self-pay | Admitting: Osteopathic Medicine

## 2020-03-24 VITALS — BP 148/66 | HR 77 | Wt 154.0 lb

## 2020-03-24 DIAGNOSIS — C9110 Chronic lymphocytic leukemia of B-cell type not having achieved remission: Secondary | ICD-10-CM | POA: Diagnosis not present

## 2020-03-24 DIAGNOSIS — E785 Hyperlipidemia, unspecified: Secondary | ICD-10-CM | POA: Diagnosis not present

## 2020-03-24 DIAGNOSIS — E1159 Type 2 diabetes mellitus with other circulatory complications: Secondary | ICD-10-CM | POA: Diagnosis not present

## 2020-03-24 DIAGNOSIS — R5382 Chronic fatigue, unspecified: Secondary | ICD-10-CM | POA: Diagnosis not present

## 2020-03-24 DIAGNOSIS — I10 Essential (primary) hypertension: Secondary | ICD-10-CM

## 2020-03-24 DIAGNOSIS — Z125 Encounter for screening for malignant neoplasm of prostate: Secondary | ICD-10-CM | POA: Diagnosis not present

## 2020-03-24 DIAGNOSIS — F324 Major depressive disorder, single episode, in partial remission: Secondary | ICD-10-CM | POA: Diagnosis not present

## 2020-03-24 DIAGNOSIS — E119 Type 2 diabetes mellitus without complications: Secondary | ICD-10-CM

## 2020-03-24 DIAGNOSIS — N529 Male erectile dysfunction, unspecified: Secondary | ICD-10-CM

## 2020-03-24 DIAGNOSIS — I152 Hypertension secondary to endocrine disorders: Secondary | ICD-10-CM

## 2020-03-24 LAB — POCT GLYCOSYLATED HEMOGLOBIN (HGB A1C)
HbA1c POC (<> result, manual entry): 5.6 % (ref 4.0–5.6)
HbA1c, POC (controlled diabetic range): 5.6 % (ref 0.0–7.0)
HbA1c, POC (prediabetic range): 5.6 % — AB (ref 5.7–6.4)
Hemoglobin A1C: 5.6 % (ref 4.0–5.6)

## 2020-03-24 MED ORDER — SILDENAFIL CITRATE 100 MG PO TABS: 50.0000 mg | ORAL_TABLET | Freq: Every day | ORAL | 11 refills | Status: DC | PRN

## 2020-03-24 NOTE — Progress Notes (Signed)
Tony Banks is a 79 y.o. male who presents to  [place] [facility]  today, 03/24/20, seeking care for the following:  . DM2 monitoring - last A1C 09/23/2019 was 6.8, today 03/24/20 is 5.6. No hypoglycemia  . Fatigue, ED - see below      ASSESSMENT & PLAN with other pertinent findings:  The primary encounter diagnosis was Type 2 diabetes mellitus without complication, without long-term current use of insulin ([place]). Diagnoses of Hypertension associated with diabetes ([place]), Hyperlipidemia, unspecified hyperlipidemia type, CLL (chronic lymphocytic leukemia) ([place]), Chronic fatigue, Erectile dysfunction, unspecified erectile dysfunction type, and Depression, major, single episode, in partial remission [facility]) were also pertinent to this visit.   1. Type 2 diabetes mellitus without complication, without long-term current use of insulin ([place]) Results for orders placed or performed in visit on 03/24/20 (from the past 24 hour(s))  POCT HgB A1C     Status: Abnormal   Collection Time: 03/24/20  9:10 AM  Result Value Ref Range   Hemoglobin A1C 5.6 4.0 - 5.6 %   HbA1c POC (<> result, manual entry) 5.6 4.0 - 5.6 %   HbA1c, POC (prediabetic range) 5.6 (A) 5.7 - 6.4 %   HbA1c, POC (controlled diabetic range) 5.6 0.0 - 7.0 %  POC A1C good, no hypoglycemia but pt reports high fasting Glc and nonadherence to low carb diet, will get blood draw  Stable, continue current Rx   2. Hypertension associated with diabetes ([place]) BP Readings from Last 3 Encounters:  03/24/20 (!) 148/66  11/15/19 (!) 156/67  10/07/19 (!) 147/61  BP a bit above goal   3. Hyperlipidemia, unspecified hyperlipidemia type 09/23/2019 LDL 67, TG 88, HDL 34 Doing well   4. CLL (chronic lymphocytic leukemia) ([facility]) Following w/ HemOnc   5. Fatigue 6. Erectile dysfunction  7. Depression  Ongoing few months, associated depression w/ death of a pet, but he isn't able to exercise like he  used to, no CP/SOB just feels tired. ED also new.  Labs as below Trial sildenafil prn    There are no Patient Instructions on file for this visit.  Orders Placed This Encounter  Procedures  . CBC with Differential/Platelet  . COMPLETE METABOLIC PANEL WITH GFR  . TSH  . Hemoglobin A1c  . POCT HgB A1C    Meds ordered this encounter  Medications  . sildenafil (VIAGRA) 100 MG tablet    Sig: Take 0.5-1 tablets (50-100 mg total) by mouth daily as needed for erectile dysfunction.    Dispense:  5 tablet    Refill:  11       Follow-up instructions: Return for RECHECK PENDING RESULTS / IF FEELING WORSE OR IF ANYTHING CHANGES - WILL CALL YOU.                                         BP (!) 148/66 (BP Location: Right Arm, Patient Position: Sitting)   Pulse 77   Wt 154 lb (69.9 kg)   SpO2 99%   BMI 23.42 kg/m   Current Meds  Medication Sig  . ACCU-CHEK AVIVA PLUS test strip USE DAILY OR UP TO 4 TIMES PER DAY AS DIRECTED). USE AS INSTRUCTED  . ACCU-CHEK SOFTCLIX LANCETS lancets Use as instructed  . AMBULATORY NON FORMULARY MEDICATION Accu-chek aviva plus test strips and accu-chek softclix lancets, use to check blood sugar up  to four times a day. Dx Type 2 diabetes.  Marland Kitchen atorvastatin (LIPITOR) 10 MG tablet Take 1 tablet (10 mg total) by mouth daily. Due for lab work  . enalapril (VASOTEC) 2.5 MG tablet Take 1 tablet (2.5 mg total) by mouth daily.  . metFORMIN (GLUCOPHAGE) 500 MG tablet Take 2 tablets (1,000 mg total) by mouth daily with breakfast.  . Multiple Vitamin (MULTIVITAMIN WITH MINERALS) TABS tablet Take 1 tablet daily by mouth.  . Omega-3 Fatty Acids (FISH OIL PO) Take by mouth.  . polyethylene glycol (MIRALAX) packet Take 17 g by mouth daily.    Results for orders placed or performed in visit on 03/24/20 (from the past 72 hour(s))  POCT HgB A1C     Status: Abnormal   Collection Time: 03/24/20  9:10 AM  Result Value Ref Range    Hemoglobin A1C 5.6 4.0 - 5.6 %   HbA1c POC (<> result, manual entry) 5.6 4.0 - 5.6 %   HbA1c, POC (prediabetic range) 5.6 (A) 5.7 - 6.4 %   HbA1c, POC (controlled diabetic range) 5.6 0.0 - 7.0 %    No results found.     All questions at time of visit were answered - patient instructed to contact office with any additional concerns or updates.  ER/RTC precautions were reviewed with the patient as applicable.   Please note: voice recognition software was used to produce this document, and typos may escape review. Please contact Dr. Sheppard Coil for any needed clarifications.

## 2020-03-27 LAB — COMPLETE METABOLIC PANEL WITH GFR
AG Ratio: 1.9 (calc) (ref 1.0–2.5)
ALT: 18 U/L (ref 9–46)
AST: 19 U/L (ref 10–35)
Albumin: 5 g/dL (ref 3.6–5.1)
Alkaline phosphatase (APISO): 91 U/L (ref 35–144)
BUN: 21 mg/dL (ref 7–25)
CO2: 27 mmol/L (ref 20–32)
Calcium: 9.9 mg/dL (ref 8.6–10.3)
Chloride: 105 mmol/L (ref 98–110)
Creat: 1.06 mg/dL (ref 0.70–1.18)
GFR, Est African American: 78 mL/min/{1.73_m2} (ref >=60)
GFR, Est Non African American: 67 mL/min/{1.73_m2} (ref >=60)
Globulin: 2.6 g/dL (calc) (ref 1.9–3.7)
Glucose, Bld: 141 mg/dL — ABNORMAL HIGH (ref 65–99)
Potassium: 5.2 mmol/L (ref 3.5–5.3)
Sodium: 140 mmol/L (ref 135–146)
Total Bilirubin: 0.7 mg/dL (ref 0.2–1.2)
Total Protein: 7.6 g/dL (ref 6.1–8.1)

## 2020-03-27 LAB — CBC WITH DIFFERENTIAL/PLATELET
Absolute Monocytes: 6179 cells/uL — ABNORMAL HIGH (ref 200–950)
Basophils Absolute: 192 cells/uL (ref 0–200)
Basophils Relative: 0.4 %
Eosinophils Absolute: 240 cells/uL (ref 15–500)
Eosinophils Relative: 0.5 %
HCT: 39.5 % (ref 38.5–50.0)
Hemoglobin: 13 g/dL — ABNORMAL LOW (ref 13.2–17.1)
Lymphs Abs: 33578 cells/uL — ABNORMAL HIGH (ref 850–3900)
MCH: 30.5 pg (ref 27.0–33.0)
MCHC: 32.9 g/dL (ref 32.0–36.0)
MCV: 92.7 fL (ref 80.0–100.0)
MPV: 10.9 fL (ref 7.5–12.5)
Monocytes Relative: 12.9 %
Neutro Abs: 7712 cells/uL (ref 1500–7800)
Neutrophils Relative %: 16.1 %
Platelets: 196 10*3/uL (ref 140–400)
RBC: 4.26 10*6/uL (ref 4.20–5.80)
RDW: 13.1 % (ref 11.0–15.0)
Total Lymphocyte: 70.1 %
WBC: 47.9 10*3/uL — ABNORMAL HIGH (ref 3.8–10.8)

## 2020-03-27 LAB — HEMOGLOBIN A1C
Hgb A1c MFr Bld: 6.9 % of total Hgb — ABNORMAL HIGH (ref <5.7)
Mean Plasma Glucose: 151 (calc)
eAG (mmol/L): 8.4 (calc)

## 2020-03-27 LAB — TEST AUTHORIZATION

## 2020-03-27 LAB — TSH: TSH: 2.39 mIU/L (ref 0.40–4.50)

## 2020-03-27 LAB — PSA: PSA: 0.4 ng/mL (ref <=4.0)

## 2020-05-26 ENCOUNTER — Telehealth: Payer: Self-pay | Admitting: Internal Medicine

## 2020-05-26 NOTE — Telephone Encounter (Signed)
Called pt per 9/21 sch msg - no answer and no vmail.

## 2020-05-28 ENCOUNTER — Ambulatory Visit: Payer: Medicare HMO | Admitting: Hematology

## 2020-05-28 ENCOUNTER — Other Ambulatory Visit: Payer: Medicare HMO

## 2020-06-04 ENCOUNTER — Telehealth: Payer: Self-pay | Admitting: Hematology

## 2020-06-04 ENCOUNTER — Inpatient Hospital Stay: Payer: Medicare HMO | Attending: Hematology

## 2020-06-04 ENCOUNTER — Other Ambulatory Visit: Payer: Self-pay

## 2020-06-04 ENCOUNTER — Inpatient Hospital Stay (HOSPITAL_BASED_OUTPATIENT_CLINIC_OR_DEPARTMENT_OTHER): Payer: Medicare HMO | Admitting: Hematology

## 2020-06-04 VITALS — BP 133/64 | HR 72 | Temp 97.9°F | Resp 18 | Ht 68.0 in | Wt 153.9 lb

## 2020-06-04 DIAGNOSIS — E785 Hyperlipidemia, unspecified: Secondary | ICD-10-CM | POA: Diagnosis not present

## 2020-06-04 DIAGNOSIS — Z79899 Other long term (current) drug therapy: Secondary | ICD-10-CM | POA: Diagnosis not present

## 2020-06-04 DIAGNOSIS — E119 Type 2 diabetes mellitus without complications: Secondary | ICD-10-CM | POA: Insufficient documentation

## 2020-06-04 DIAGNOSIS — C9110 Chronic lymphocytic leukemia of B-cell type not having achieved remission: Secondary | ICD-10-CM

## 2020-06-04 DIAGNOSIS — Z87891 Personal history of nicotine dependence: Secondary | ICD-10-CM | POA: Diagnosis not present

## 2020-06-04 DIAGNOSIS — R61 Generalized hyperhidrosis: Secondary | ICD-10-CM | POA: Diagnosis not present

## 2020-06-04 DIAGNOSIS — I10 Essential (primary) hypertension: Secondary | ICD-10-CM | POA: Insufficient documentation

## 2020-06-04 LAB — CBC WITH DIFFERENTIAL/PLATELET
Abs Immature Granulocytes: 0 10*3/uL (ref 0.00–0.07)
Basophils Absolute: 0 10*3/uL (ref 0.0–0.1)
Basophils Relative: 0 %
Eosinophils Absolute: 1.1 10*3/uL — ABNORMAL HIGH (ref 0.0–0.5)
Eosinophils Relative: 2 %
HCT: 36 % — ABNORMAL LOW (ref 39.0–52.0)
Hemoglobin: 11.6 g/dL — ABNORMAL LOW (ref 13.0–17.0)
Lymphocytes Relative: 79 %
Lymphs Abs: 41.8 10*3/uL — ABNORMAL HIGH (ref 0.7–4.0)
MCH: 30.5 pg (ref 26.0–34.0)
MCHC: 32.2 g/dL (ref 30.0–36.0)
MCV: 94.7 fL (ref 80.0–100.0)
Monocytes Absolute: 2.1 10*3/uL — ABNORMAL HIGH (ref 0.1–1.0)
Monocytes Relative: 4 %
Neutro Abs: 7.9 10*3/uL — ABNORMAL HIGH (ref 1.7–7.7)
Neutrophils Relative %: 15 %
Platelets: 138 10*3/uL — ABNORMAL LOW (ref 150–400)
RBC: 3.8 MIL/uL — ABNORMAL LOW (ref 4.22–5.81)
RDW: 14.1 % (ref 11.5–15.5)
WBC: 52.9 10*3/uL (ref 4.0–10.5)
nRBC: 0 % (ref 0.0–0.2)

## 2020-06-04 LAB — CMP (CANCER CENTER ONLY)
ALT: 20 U/L (ref 0–44)
AST: 21 U/L (ref 15–41)
Albumin: 4.2 g/dL (ref 3.5–5.0)
Alkaline Phosphatase: 88 U/L (ref 38–126)
Anion gap: 8 (ref 5–15)
BUN: 21 mg/dL (ref 8–23)
CO2: 24 mmol/L (ref 22–32)
Calcium: 9.2 mg/dL (ref 8.9–10.3)
Chloride: 108 mmol/L (ref 98–111)
Creatinine: 1.01 mg/dL (ref 0.61–1.24)
GFR, Est AFR Am: >60 mL/min (ref >60)
GFR, Estimated: >60 mL/min (ref >60)
Glucose, Bld: 129 mg/dL — ABNORMAL HIGH (ref 70–99)
Potassium: 4.2 mmol/L (ref 3.5–5.1)
Sodium: 140 mmol/L (ref 135–145)
Total Bilirubin: 0.8 mg/dL (ref 0.3–1.2)
Total Protein: 7.2 g/dL (ref 6.5–8.1)

## 2020-06-04 LAB — LACTATE DEHYDROGENASE: LDH: 158 U/L (ref 98–192)

## 2020-06-04 NOTE — Telephone Encounter (Signed)
Scheduled per los. Gave avs and calendar  

## 2020-06-04 NOTE — Progress Notes (Signed)
CRITICAL VALUE STICKER CRITICAL VALUE: WBC 52.9 RECEIVER (on-site recipient of call):S.Murphy Duzan, RN DATE & TIME NOTIFIED: 06/04/20; 0601 MESSENGER (representative from lab):Marie MD NOTIFIED: Dr.Kale TIME OF NOTIFICATION: 0930 RESPONSE: Acknowledged/no orders received

## 2020-06-04 NOTE — Progress Notes (Signed)
HEMATOLOGY/ONCOLOGY CLINIC NOTE  Date of Service: 06/04/2020  Patient Care Team: Emeterio Reeve, DO as PCP - General (Osteopathic Medicine)  CHIEF COMPLAINTS/PURPOSE OF CONSULTATION:  Chronic Lymphocytic Leukemia   HISTORY OF PRESENTING ILLNESS:   Tony Banks is a wonderful 79 y.o. male who has been previously followed by my colleague Dr. 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 He is accompanied today by his wife. The pt reports that he is doing well overall. Verbal consent has been given by the pt for a clinical observer, Mathis Fare, to be present.    The pt was diagnosed with CLL in October 2018, following a Peripheral blood flow cytometry and cytogenetics report. 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 The pt notes that his CLL was picked up off of routine labs and was not prompted by any associated symptoms.   The pt reports that he has not had any symptoms or concerns in the last 3 months he denies any constitutional symptoms or noticing any new lumps or bumps.    Most recent lab results (03/22/18) of CBC w/diff, CMP is as follows: all values are WNL except for WBC at 18.4k, Lymphs abs at 11.9k, Glucose at 128. LDH 03/22/18 is normal at 160 QIG 03/22/18 is pending  On review of systems, pt reports good energy levels, and denies fevers, chills, night sweats, unexpected weight loss, noticing any new lumps or bumps, abdominal pains, and any other symptoms.   Interval History:  Tony Banks returns today for management and evaluation of his CLL. The patient's last visit with [place] was on 11/27/2019. The pt reports that he is doing well overall.  The pt reports that he has some occasional, mild night sweats. He does layer up cover at night. Pt received his second COVID19 vaccine in mid-April. He is planning on going with his wife to receive the booster.   Lab results today (06/04/20)  of CBC w/diff and CMP is as follows: all values are WNL except for WBC at 52.9K, RBC at 3.80, Hgb at 11.6, HCT at 36.0, PLT at 138K, Neutro Abs at 7.9K, Lymphs Abs at 41.8K, Mono Abs at 2.1K, Eos Abs at 1.1K, Glucose at 129. 06/04/2020 LDH at 158  On review of systems, pt reports occasional night sweats and denies fevers, chills, new lumps/bumps, SOB, abdominal pain, constipation, diarrhea and any other symptoms.   MEDICAL HISTORY:  Past Medical History:  Diagnosis Date  . Aortic atherosclerosis ([employer])   . CLL (chronic lymphocytic leukemia) ([place])   . Constipation    severe  . Diabetes mellitus without complication ([place])   . Diverticulosis   . HLD (hyperlipidemia)   . Hypertension   . Nephrolithiasis     SURGICAL HISTORY: Past Surgical History:  Procedure Laterality Date  . FINGER FRACTURE SURGERY Right    middle finger  . SHOULDER ARTHROSCOPY Bilateral   . VASECTOMY      SOCIAL HISTORY: Social History   Socioeconomic History  . Marital status: Married    Spouse name: [name]  . Number of children: 3  . Years of education: 56  . Highest education level: Some college, no degree  Occupational History  . Occupation: retired  Tobacco Use  . Smoking status: Former Smoker    Quit date: 07/31/1977    Years since quitting: 42.8  . Smokeless tobacco: Never Used  . Tobacco comment: quit 30 yrs ago  Vaping Use  .  Vaping Use: Never used  Substance and Sexual Activity  . Alcohol use: No  . Drug use: No  . Sexual activity: Never    Birth control/protection: Abstinence  Other Topics Concern  . Not on file  Social History Narrative   Drinks 1-2 cups of coffee.   Social Determinants of Health   Financial Resource Strain:   . Difficulty of Paying Living Expenses: Not on file  Food Insecurity:   . Worried About Charity fundraiser in the Last Year: Not on file  . Ran Out of Food in the Last Year: Not on file  Transportation Needs:   . Lack of Transportation (Medical): Not on  file  . Lack of Transportation (Non-Medical): Not on file  Physical Activity:   . Days of Exercise per Week: Not on file  . Minutes of Exercise per Session: Not on file  Stress:   . Feeling of Stress : Not on file  Social Connections:   . Frequency of Communication with Friends and Family: Not on file  . Frequency of Social Gatherings with Friends and Family: Not on file  . Attends Religious Services: Not on file  . Active Member of Clubs or Organizations: Not on file  . Attends Archivist Meetings: Not on file  . Marital Status: Not on file  Intimate Partner Violence:   . Fear of Current or Ex-Partner: Not on file  . Emotionally Abused: Not on file  . Physically Abused: Not on file  . Sexually Abused: Not on file    FAMILY HISTORY: Family History  Problem Relation Age of Onset  . Heart disease Mother   . Stroke Mother   . Cancer Father   . Stroke Maternal Grandfather   . Colon cancer Neg Hx   . Pancreatic cancer Neg Hx   . Rectal cancer Neg Hx   . Stomach cancer Neg Hx     ALLERGIES:  is allergic to shellfish allergy.  MEDICATIONS:  Current Outpatient Medications  Medication Sig Dispense Refill  . ACCU-CHEK AVIVA PLUS test strip USE DAILY OR UP TO 4 TIMES PER DAY AS DIRECTED). USE AS INSTRUCTED 200 strip PRN  . ACCU-CHEK SOFTCLIX LANCETS lancets Use as instructed 100 each 12  . AMBULATORY NON FORMULARY MEDICATION Accu-chek aviva plus test strips and accu-chek softclix lancets, use to check blood sugar up to four times a day. Dx Type 2 diabetes. 100 Units 3  . atorvastatin (LIPITOR) 10 MG tablet Take 1 tablet (10 mg total) by mouth daily. Due for lab work 90 tablet 3  . enalapril (VASOTEC) 2.5 MG tablet Take 1 tablet (2.5 mg total) by mouth daily. 90 tablet 3  . metFORMIN (GLUCOPHAGE) 500 MG tablet Take 2 tablets (1,000 mg total) by mouth daily with breakfast. 180 tablet 3  . Multiple Vitamin (MULTIVITAMIN WITH MINERALS) TABS tablet Take 1 tablet daily by mouth.     . Omega-3 Fatty Acids (FISH OIL PO) Take by mouth.    . polyethylene glycol (MIRALAX) packet Take 17 g by mouth daily. 28 each 0  . sildenafil (VIAGRA) 100 MG tablet Take 0.5-1 tablets (50-100 mg total) by mouth daily as needed for erectile dysfunction. (Patient not taking: Reported on 06/04/2020) 5 tablet 11   No current facility-administered medications for this visit.    REVIEW OF SYSTEMS:   A 10+ POINT REVIEW OF SYSTEMS WAS OBTAINED including neurology, dermatology, psychiatry, cardiac, respiratory, lymph, extremities, GI, GU, Musculoskeletal, constitutional, breasts, reproductive, HEENT.  All pertinent positives  are noted in the HPI.  All others are negative.   PHYSICAL EXAMINATION: ECOG PERFORMANCE STATUS: 0 - Asymptomatic  . Vitals:   06/04/20 0910  BP: 133/64  Pulse: 72  Resp: 18  Temp: 97.9 F (36.6 C)  SpO2: 100%   Filed Weights   06/04/20 0910  Weight: 153 lb 14.4 oz (69.8 kg)   .Body mass index is 23.4 kg/m.   GENERAL:alert, in no acute distress and comfortable SKIN: no acute rashes, no significant lesions EYES: conjunctiva are pink and non-injected, sclera anicteric OROPHARYNX: MMM, no exudates, no oropharyngeal erythema or ulceration NECK: supple, no JVD LYMPH:  no palpable lymphadenopathy in the cervical, axillary or inguinal regions LUNGS: clear to auscultation b/l with normal respiratory effort HEART: regular rate & rhythm ABDOMEN:  normoactive bowel sounds , non tender, not distended. No palpable hepatosplenomegaly.  Extremity: no pedal edema PSYCH: alert & oriented x 3 with fluent speech NEURO: no focal motor/sensory deficits  LABORATORY DATA:  I have reviewed the data as listed  . CBC Latest Ref Rng & Units 06/04/2020 03/24/2020 11/15/2019  WBC 4.0 - 10.5 K/uL 52.9(HH) 47.9(H) 37.7(H)  Hemoglobin 13.0 - 17.0 g/dL 11.6(L) 13.0(L) 12.6(L)  Hematocrit 39 - 52 % 36.0(L) 39.5 38.1(L)  Platelets 150 - 400 K/uL 138(L) 196 153   . CBC    Component  Value Date/Time   WBC 52.9 (HH) 06/04/2020 0847   RBC 3.80 (L) 06/04/2020 0847   HGB 11.6 (L) 06/04/2020 0847   HGB 13.1 03/22/2018 0944   HGB 12.8 (L) 05/29/2017 1141   HCT 36.0 (L) 06/04/2020 0847   HCT 38.7 05/29/2017 1141   PLT 138 (L) 06/04/2020 0847   PLT 146 03/22/2018 0944   PLT 151 05/29/2017 1141   MCV 94.7 06/04/2020 0847   MCV 91.3 05/29/2017 1141   MCH 30.5 06/04/2020 0847   MCHC 32.2 06/04/2020 0847   RDW 14.1 06/04/2020 0847   RDW 13.8 05/29/2017 1141   LYMPHSABS 41.8 (H) 06/04/2020 0847   LYMPHSABS 8.8 (H) 05/29/2017 1141   MONOABS 2.1 (H) 06/04/2020 0847   MONOABS 0.7 05/29/2017 1141   EOSABS 1.1 (H) 06/04/2020 0847   EOSABS 0.1 05/29/2017 1141   BASOSABS 0.0 06/04/2020 0847   BASOSABS 0.0 05/29/2017 1141    . CMP Latest Ref Rng & Units 06/04/2020 03/24/2020 11/15/2019  Glucose 70 - 99 mg/dL 129(H) 141(H) 140(H)  BUN 8 - 23 mg/dL _0 Creatinine 0.61 - 1.24 mg/dL 1.01 1.06 1.13  Sodium 135 - 145 mmol/L 140 140 141  Potassium 3.5 - 5.1 mmol/L 4.2 5.2 4.3  Chloride 98 - 111 mmol/L 108 105 107  CO2 22 - 32 mmol/L _1 Calcium 8.9 - 10.3 mg/dL 9.2 9.9 9.1  Total Protein 6.5 - 8.1 g/dL 7.2 7.6 7.3  Total Bilirubin 0.3 - 1.2 mg/dL 0.8 0.7 0.8  Alkaline Phos 38 - 126 U/L 88 - 79  AST 15 - 41 U/L _2 ALT 0 - 44 U/L _3 06/08/17 FISH CLL Prognostic Panel:    06/08/17 Cytogenetics:   05/29/17 Peripheral blood flow cytometry:    RADIOGRAPHIC STUDIES: I have personally reviewed the radiological images as listed and agreed with the findings in the report. No results found.  ASSESSMENT & PLAN:  79 y.o. male with  1. Chronic Lymphocytic Leukemia Rai 1  06/08/17 CLL FISH Prognostic panel negative for del17p, del11q, del13q, indicating intermediate risk 11/15/2019 CT C/A/P ([id number]) revealed gradual  lymph node increase compared to 2018 CT. No major lymphoma progression. Mildly enlarged prostate.    PLAN: -Discussed pt labwork  today, 06/04/20; mild increase in WBC, mild anemia, PLT are borderline low, blood chemistries are nml, LDH is WNL. -No lab or clinical evidence of significant CLL progression at this time. No indication to initiate treatment immediately.  -Discussed CDC guidelines regarding the COVID19 booster. Advised pt that he may schedule at the [place] pt receive the annual flu vaccine. Wait two weeks between vaccinations if possible.  -As WBC continue to rise, will increase frequency of follow-ups.  -Will see back in 4 months with labs    FOLLOW UP: RTC with Dr Irene Limbo with labs in 4 months    The total time spent in the appt was 20 minutes and more than 50% was on counseling and direct patient cares.  All of the patient's questions were answered with apparent satisfaction. The patient knows to call the clinic with any problems, questions or concerns.    Sullivan Lone MD [place] AAHIVMS [facility] Hematology/Oncology Physician [facility]  (Office):       [phone] (Work cell):  [phone] (Fax):           [phone]  06/04/2020 10:06 AM  I, Yevette Edwards, am acting as a scribe for Dr. Sullivan Lone.   .I have reviewed the above documentation for accuracy and completeness, and I agree with the above. [name] MD

## 2020-07-02 ENCOUNTER — Ambulatory Visit (INDEPENDENT_AMBULATORY_CARE_PROVIDER_SITE_OTHER): Payer: Medicare HMO | Admitting: Osteopathic Medicine

## 2020-07-02 ENCOUNTER — Encounter: Payer: Self-pay | Admitting: Osteopathic Medicine

## 2020-07-02 ENCOUNTER — Other Ambulatory Visit: Payer: Self-pay

## 2020-07-02 VITALS — BP 145/70 | HR 75 | Temp 98.1°F | Wt 156.0 lb

## 2020-07-02 DIAGNOSIS — C9110 Chronic lymphocytic leukemia of B-cell type not having achieved remission: Secondary | ICD-10-CM | POA: Diagnosis not present

## 2020-07-02 DIAGNOSIS — E785 Hyperlipidemia, unspecified: Secondary | ICD-10-CM | POA: Diagnosis not present

## 2020-07-02 DIAGNOSIS — E1159 Type 2 diabetes mellitus with other circulatory complications: Secondary | ICD-10-CM

## 2020-07-02 DIAGNOSIS — I152 Hypertension secondary to endocrine disorders: Secondary | ICD-10-CM

## 2020-07-02 DIAGNOSIS — E119 Type 2 diabetes mellitus without complications: Secondary | ICD-10-CM

## 2020-07-02 LAB — POCT GLYCOSYLATED HEMOGLOBIN (HGB A1C): Hemoglobin A1C: 6.9 % — AB (ref 4.0–5.6)

## 2020-07-02 MED ORDER — SILDENAFIL CITRATE 100 MG PO TABS
50.0000 mg | ORAL_TABLET | Freq: Every day | ORAL | 11 refills | Status: DC | PRN
Start: 2020-07-02 — End: 2021-04-12

## 2020-07-02 NOTE — Progress Notes (Signed)
Tony Banks is a 79 y.o. male who presents to  [place] [facility]  today, 07/02/20, seeking care for the following:  . DM2 . HTN      ASSESSMENT & PLAN with other pertinent findings:  The primary encounter diagnosis was Type 2 diabetes mellitus without complication, without long-term current use of insulin ([place]). Diagnoses of Hypertension associated with diabetes ([place]), Hyperlipidemia, unspecified hyperlipidemia type, and CLL (chronic lymphocytic leukemia) ([place]) were also pertinent to this visit.   Results for orders placed or performed in visit on 07/02/20 (from the past 24 hour(s))  POCT HgB A1C     Status: Abnormal   Collection Time: 07/02/20  8:57 AM  Result Value Ref Range   Hemoglobin A1C 6.9 (A) 4.0 - 5.6 %   HbA1c POC (<> result, manual entry)     HbA1c, POC (prediabetic range)     HbA1c, POC (controlled diabetic range)      1. Type 2 diabetes mellitus without complication, without long-term current use of insulin ([facility]) A1C good control  2. Hypertension associated with diabetes ([place]) Repeat BP better on manual check   3. Hyperlipidemia, unspecified hyperlipidemia type Continue current meds  4. CLL (chronic lymphocytic leukemia) ([facility]) Stable following w/ Onc     There are no Patient Instructions on file for this visit.  Orders Placed This Encounter  Procedures  . POCT HgB A1C    Meds ordered this encounter  Medications  . sildenafil (VIAGRA) 100 MG tablet    Sig: Take 0.5-1 tablets (50-100 mg total) by mouth daily as needed for erectile dysfunction.    Dispense:  30 tablet    Refill:  11       Follow-up instructions: Return in about 4 months (around 11/02/2020) for MONITOR SUGARS, MONITOR BP, SEE ME SOONER IF NEEDED .                                         BP (!) 145/70   Pulse 75   Temp 98.1 F (36.7 C) (Oral)   Wt 156 lb 0.6 oz (70.8 kg)   BMI 23.73 kg/m    Current Meds  Medication Sig  . ACCU-CHEK AVIVA PLUS test strip USE DAILY OR UP TO 4 TIMES PER DAY AS DIRECTED). USE AS INSTRUCTED  . ACCU-CHEK SOFTCLIX LANCETS lancets Use as instructed  . AMBULATORY NON FORMULARY MEDICATION Accu-chek aviva plus test strips and accu-chek softclix lancets, use to check blood sugar up to four times a day. Dx Type 2 diabetes.  Mar[place] Kitchen atorvastatin (LIPITOR) 10 MG tablet Take 1 tablet (10 mg total) by mouth daily. Due for lab work  . enalapril (VASOTEC) 2.5 MG tablet Take 1 tablet (2.5 mg total) by mouth daily.  . metFORMIN (GLUCOPHAGE) 500 MG tablet Take 2 tablets (1,000 mg total) by mouth daily with breakfast.  . Multiple Vitamin (MULTIVITAMIN WITH MINERALS) TABS tablet Take 1 tablet daily by mouth.  . Omega-3 Fatty Acids (FISH OIL PO) Take by mouth.  . polyethylene glycol (MIRALAX) packet Take 17 g by mouth daily.  . sildenafil (VIAGRA) 100 MG tablet Take 0.5-1 tablets (50-100 mg total) by mouth daily as needed for erectile dysfunction.  . [DISCONTINUED] sildenafil (VIAGRA) 100 MG tablet Take 0.5-1 tablets (50-100 mg total) by mouth daily as needed for erectile dysfunction.    Results for orders placed or performed  in visit on 07/02/20 (from the past 72 hour(s))  POCT HgB A1C     Status: Abnormal   Collection Time: 07/02/20  8:57 AM  Result Value Ref Range   Hemoglobin A1C 6.9 (A) 4.0 - 5.6 %   HbA1c POC (<> result, manual entry)     HbA1c, POC (prediabetic range)     HbA1c, POC (controlled diabetic range)      No results found.     All questions at time of visit were answered - patient instructed to contact office with any additional concerns or updates.  ER/RTC precautions were reviewed with the patient as applicable.   Please note: voice recognition software was used to produce this document, and typos may escape review. Please contact Dr. Sheppard Coil for any needed clarifications.

## 2020-09-28 ENCOUNTER — Other Ambulatory Visit: Payer: Self-pay | Admitting: Osteopathic Medicine

## 2020-09-28 DIAGNOSIS — E119 Type 2 diabetes mellitus without complications: Secondary | ICD-10-CM

## 2020-09-29 ENCOUNTER — Other Ambulatory Visit: Payer: Self-pay | Admitting: Osteopathic Medicine

## 2020-09-29 DIAGNOSIS — E119 Type 2 diabetes mellitus without complications: Secondary | ICD-10-CM

## 2020-10-01 NOTE — Progress Notes (Signed)
HEMATOLOGY/ONCOLOGY CLINIC NOTE  Date of Service: 10/01/2020  Patient Care Team: Emeterio Reeve, DO as PCP - General (Osteopathic Medicine)  CHIEF COMPLAINTS/PURPOSE OF CONSULTATION:  Chronic Lymphocytic Leukemia   HISTORY OF PRESENTING ILLNESS:   Tony Banks is a wonderful 80 y.o. male who has been previously followed by my colleague Dr. 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 He is accompanied today by his wife. The pt reports that he is doing well overall. Verbal consent has been given by the pt for a clinical observer, Mathis Fare, to be present.    The pt was diagnosed with CLL in October 2018, following a Peripheral blood flow cytometry and cytogenetics report. 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 The pt notes that his CLL was picked up off of routine labs and was not prompted by any associated symptoms.   The pt reports that he has not had any symptoms or concerns in the last 3 months he denies any constitutional symptoms or noticing any new lumps or bumps.    Most recent lab results (03/22/18) of CBC w/diff, CMP is as follows: all values are WNL except for WBC at 18.4k, Lymphs abs at 11.9k, Glucose at 128. LDH 03/22/18 is normal at 160 QIG 03/22/18 is pending  On review of systems, pt reports good energy levels, and denies fevers, chills, night sweats, unexpected weight loss, noticing any new lumps or bumps, abdominal pains, and any other symptoms.   Interval History:   Tony Banks returns today for management and evaluation of his CLL. The patient's last visit with [place] was on 06/04/2020. The pt reports that he is doing well overall.  The pt reports no new symptoms or concerns. The pt has received his COVId vaccines and booster and annual flu shot. The pt believes he has received his Pneumovax.   Lab results today 10/02/2020 of CBC w/diff and CMP is as follows: all  values are WNL except for WBC of 66.9K, RBC of 4.07, Hgb of 12.6, HCT of 38.8. Chemistries in progress. 10/02/2020 LDH in progress.  On review of systems, pt denies fevers, chills, night sweats, new lumps/bumps, skin rashes, SOB, abdominal pain, severe fatigue, changes in bowel habits, back pain, testicular pain and any other symptoms.  MEDICAL HISTORY:  Past Medical History:  Diagnosis Date  . Aortic atherosclerosis ([place])   . CLL (chronic lymphocytic leukemia) ([place])   . Constipation    severe  . Diabetes mellitus without complication ([place])   . Diverticulosis   . HLD (hyperlipidemia)   . Hypertension   . Nephrolithiasis     SURGICAL HISTORY: Past Surgical History:  Procedure Laterality Date  . FINGER FRACTURE SURGERY Right    middle finger  . SHOULDER ARTHROSCOPY Bilateral   . VASECTOMY      SOCIAL HISTORY: Social History   Socioeconomic History  . Marital status: Married    Spouse name: [name]  . Number of children: 3  . Years of education: 67  . Highest education level: Some college, no degree  Occupational History  . Occupation: retired  Tobacco Use  . Smoking status: Former Smoker    Quit date: 07/31/1977    Years since quitting: 43.2  . Smokeless tobacco: Never Used  . Tobacco comment: quit 30 yrs ago  Vaping Use  . Vaping Use: Never used  Substance and Sexual Activity  . Alcohol use: No  . Drug use: No  .  Sexual activity: Never    Birth control/protection: Abstinence  Other Topics Concern  . Not on file  Social History Narrative   Drinks 1-2 cups of coffee.   Social Determinants of Health   Financial Resource Strain: Not on file  Food Insecurity: Not on file  Transportation Needs: Not on file  Physical Activity: Not on file  Stress: Not on file  Social Connections: Not on file  Intimate Partner Violence: Not on file    FAMILY HISTORY: Family History  Problem Relation Age of Onset  . Heart disease Mother   . Stroke Mother   . Cancer Father    . Stroke Maternal Grandfather   . Colon cancer Neg Hx   . Pancreatic cancer Neg Hx   . Rectal cancer Neg Hx   . Stomach cancer Neg Hx     ALLERGIES:  is allergic to shellfish allergy.  MEDICATIONS:  Current Outpatient Medications  Medication Sig Dispense Refill  . metFORMIN (GLUCOPHAGE) 500 MG tablet TAKE 2 TABLETS BY MOUTH EVERY DAY WITH BREAKFAST 180 tablet 3  . ACCU-CHEK AVIVA PLUS test strip USE DAILY OR UP TO 4 TIMES PER DAY AS DIRECTED). USE AS INSTRUCTED 200 strip PRN  . ACCU-CHEK SOFTCLIX LANCETS lancets Use as instructed 100 each 12  . AMBULATORY NON FORMULARY MEDICATION Accu-chek aviva plus test strips and accu-chek softclix lancets, use to check blood sugar up to four times a day. Dx Type 2 diabetes. 100 Units 3  . atorvastatin (LIPITOR) 10 MG tablet TAKE 1 TABLET (10 MG TOTAL) BY MOUTH DAILY. DUE FOR LAB WORK 90 tablet 1  . enalapril (VASOTEC) 2.5 MG tablet TAKE 1 TABLET BY MOUTH EVERY DAY 90 tablet 1  . Multiple Vitamin (MULTIVITAMIN WITH MINERALS) TABS tablet Take 1 tablet daily by mouth.    . Omega-3 Fatty Acids (FISH OIL PO) Take by mouth.    . polyethylene glycol (MIRALAX) packet Take 17 g by mouth daily. 28 each 0  . sildenafil (VIAGRA) 100 MG tablet Take 0.5-1 tablets (50-100 mg total) by mouth daily as needed for erectile dysfunction. 30 tablet 11   No current facility-administered medications for this visit.    REVIEW OF SYSTEMS:   10 Point review of Systems was done is negative except as noted above.  PHYSICAL EXAMINATION: ECOG PERFORMANCE STATUS: 0 - Asymptomatic  . Vitals:   10/02/20 0915  BP: (!) 157/65  Pulse: 94  Resp: 20  Temp: 98.1 F (36.7 C)  SpO2: 98%   There were no vitals filed for this visit. .There is no height or weight on file to calculate BMI.    GENERAL:alert, in no acute distress and comfortable SKIN: no acute rashes, no significant lesions. Few spots on ear.  EYES: conjunctiva are pink and non-injected, sclera  anicteric OROPHARYNX: MMM, no exudates, no oropharyngeal erythema or ulceration NECK: supple, no JVD LYMPH:  no palpable lymphadenopathy in the axillary or inguinal regions. Few small b/l lymph nodes in cervical region. LUNGS: clear to auscultation b/l with normal respiratory effort HEART: regular rate & rhythm ABDOMEN:  normoactive bowel sounds , non tender, not distended. Extremity: no pedal edema PSYCH: alert & oriented x 3 with fluent speech NEURO: no focal motor/sensory deficits  LABORATORY DATA:  I have reviewed the data as listed  . CBC Latest Ref Rng & Units 10/02/2020 06/04/2020 03/24/2020  WBC 4.0 - 10.5 K/uL 66.9(HH) 52.9(HH) 47.9(H)  Hemoglobin 13.0 - 17.0 g/dL 12.6(L) 11.6(L) 13.0(L)  Hematocrit 39.0 - 52.0 % 38.8(L) 36.0(L)  39.5  Platelets 150 - 400 K/uL 160 138(L) 196   . CBC    Component Value Date/Time   WBC 66.9 (HH) 10/02/2020 0900   RBC 4.07 (L) 10/02/2020 0900   HGB 12.6 (L) 10/02/2020 0900   HGB 13.1 03/22/2018 0944   HGB 12.8 (L) 05/29/2017 1141   HCT 38.8 (L) 10/02/2020 0900   HCT 38.7 05/29/2017 1141   PLT 160 10/02/2020 0900   PLT 146 03/22/2018 0944   PLT 151 05/29/2017 1141   MCV 95.3 10/02/2020 0900   MCV 91.3 05/29/2017 1141   MCH 31.0 10/02/2020 0900   MCHC 32.5 10/02/2020 0900   RDW 14.5 10/02/2020 0900   RDW 13.8 05/29/2017 1141   LYMPHSABS 54.1 (H) 10/02/2020 0900   LYMPHSABS 8.8 (H) 05/29/2017 1141   MONOABS 5.2 (H) 10/02/2020 0900   MONOABS 0.7 05/29/2017 1141   EOSABS 0.2 10/02/2020 0900   EOSABS 0.1 05/29/2017 1141   BASOSABS 0.1 10/02/2020 0900   BASOSABS 0.0 05/29/2017 1141    . CMP Latest Ref Rng & Units 10/02/2020 06/04/2020 03/24/2020  Glucose 70 - 99 mg/dL 156(H) 129(H) 141(H)  BUN 8 - 23 mg/dL $Remove'23 21 21  'VkeElDF$ Creatinine 0.61 - 1.24 mg/dL 1.17 1.01 1.06  Sodium 135 - 145 mmol/L 140 140 140  Potassium 3.5 - 5.1 mmol/L 4.5 4.2 5.2  Chloride 98 - 111 mmol/L 107 108 105  CO2 22 - 32 mmol/L $RemoveB'24 24 27  'vgKDmsBJ$ Calcium 8.9 - 10.3 mg/dL 9.4  9.2 9.9  Total Protein 6.5 - 8.1 g/dL 7.5 7.2 7.6  Total Bilirubin 0.3 - 1.2 mg/dL 0.7 0.8 0.7  Alkaline Phos 38 - 126 U/L 92 88 -  AST 15 - 41 U/L $Remo'21 21 19  'CxKZp$ ALT 0 - 44 U/L $Remo'20 20 18   'ZrmXj$ 06/08/17 FISH CLL Prognostic Panel:    06/08/17 Cytogenetics:   05/29/17 Peripheral blood flow cytometry:    RADIOGRAPHIC STUDIES: I have personally reviewed the radiological images as listed and agreed with the findings in the report. No results found.  ASSESSMENT & PLAN:  80 y.o. male with  1. Chronic Lymphocytic Leukemia Rai 1  06/08/17 CLL FISH Prognostic panel negative for del17p, del11q, del13q, indicating intermediate risk 11/15/2019 CT C/A/P ([id number]) revealed gradual lymph node increase compared to 2018 CT. No major lymphoma progression. Mildly enlarged prostate.    PLAN: -Discussed pt labwork today, WBC gradually increasing but at stable trend, Chemistries and LDH pending. -Recommend pt f/u with PCP regarding confirmation of Pneumovax vaccine and potential Shingrix. -Recommend pt f/u w Dermatologist regarding spots on ear. The pt will see who is covered within his insurance and is agreeable to this. -No lab or clinical evidence of significant CLL progression at this time. No indication to initiate treatment immediately.  -Will see back in 5 months with labs.   FOLLOW UP: RTC with Dr Irene Limbo with labs in 20 weeks     The total time spent in the appointment was 20 minutes and more than 50% was on counseling and direct patient cares.  All of the patient's questions were answered with apparent satisfaction. The patient knows to call the clinic with any problems, questions or concerns.    Sullivan Lone MD [place] AAHIVMS [facility] Hematology/Oncology Physician [facility]  (Office):       [phone] (Work cell):  [phone] (Fax):           [phone]  10/01/2020 12:37 PM  I, Reinaldo Raddle, am acting as scribe for Dr.  Sullivan Lone, MD.    .I have reviewed the above  documentation for accuracy and completeness, and I agree with the above. [name] MD

## 2020-10-02 ENCOUNTER — Inpatient Hospital Stay: Payer: Medicare HMO

## 2020-10-02 ENCOUNTER — Other Ambulatory Visit: Payer: Self-pay

## 2020-10-02 ENCOUNTER — Inpatient Hospital Stay: Payer: Medicare HMO | Attending: Hematology | Admitting: Hematology

## 2020-10-02 ENCOUNTER — Telehealth: Payer: Self-pay

## 2020-10-02 VITALS — BP 157/65 | HR 94 | Temp 98.1°F | Resp 20 | Ht 68.0 in | Wt 157.3 lb

## 2020-10-02 DIAGNOSIS — K5900 Constipation, unspecified: Secondary | ICD-10-CM | POA: Insufficient documentation

## 2020-10-02 DIAGNOSIS — I700 Atherosclerosis of aorta: Secondary | ICD-10-CM | POA: Diagnosis not present

## 2020-10-02 DIAGNOSIS — Z87442 Personal history of urinary calculi: Secondary | ICD-10-CM | POA: Diagnosis not present

## 2020-10-02 DIAGNOSIS — E119 Type 2 diabetes mellitus without complications: Secondary | ICD-10-CM | POA: Diagnosis not present

## 2020-10-02 DIAGNOSIS — C9510 Chronic leukemia of unspecified cell type not having achieved remission: Secondary | ICD-10-CM | POA: Insufficient documentation

## 2020-10-02 DIAGNOSIS — C9110 Chronic lymphocytic leukemia of B-cell type not having achieved remission: Secondary | ICD-10-CM | POA: Diagnosis not present

## 2020-10-02 DIAGNOSIS — I10 Essential (primary) hypertension: Secondary | ICD-10-CM | POA: Diagnosis not present

## 2020-10-02 DIAGNOSIS — Z87891 Personal history of nicotine dependence: Secondary | ICD-10-CM | POA: Insufficient documentation

## 2020-10-02 DIAGNOSIS — E785 Hyperlipidemia, unspecified: Secondary | ICD-10-CM | POA: Diagnosis not present

## 2020-10-02 LAB — CBC WITH DIFFERENTIAL/PLATELET
Abs Immature Granulocytes: 0.44 10*3/uL — ABNORMAL HIGH (ref 0.00–0.07)
Basophils Absolute: 0.1 10*3/uL (ref 0.0–0.1)
Basophils Relative: 0 %
Eosinophils Absolute: 0.2 10*3/uL (ref 0.0–0.5)
Eosinophils Relative: 0 %
HCT: 38.8 % — ABNORMAL LOW (ref 39.0–52.0)
Hemoglobin: 12.6 g/dL — ABNORMAL LOW (ref 13.0–17.0)
Immature Granulocytes: 1 %
Lymphocytes Relative: 80 %
Lymphs Abs: 54.1 10*3/uL — ABNORMAL HIGH (ref 0.7–4.0)
MCH: 31 pg (ref 26.0–34.0)
MCHC: 32.5 g/dL (ref 30.0–36.0)
MCV: 95.3 fL (ref 80.0–100.0)
Monocytes Absolute: 5.2 10*3/uL — ABNORMAL HIGH (ref 0.1–1.0)
Monocytes Relative: 8 %
Neutro Abs: 7 10*3/uL (ref 1.7–7.7)
Neutrophils Relative %: 11 %
Platelets: 160 10*3/uL (ref 150–400)
RBC: 4.07 MIL/uL — ABNORMAL LOW (ref 4.22–5.81)
RDW: 14.5 % (ref 11.5–15.5)
WBC: 66.9 10*3/uL (ref 4.0–10.5)
nRBC: 0 % (ref 0.0–0.2)

## 2020-10-02 LAB — CMP (CANCER CENTER ONLY)
ALT: 20 U/L (ref 0–44)
AST: 21 U/L (ref 15–41)
Albumin: 4.3 g/dL (ref 3.5–5.0)
Alkaline Phosphatase: 92 U/L (ref 38–126)
Anion gap: 9 (ref 5–15)
BUN: 23 mg/dL (ref 8–23)
CO2: 24 mmol/L (ref 22–32)
Calcium: 9.4 mg/dL (ref 8.9–10.3)
Chloride: 107 mmol/L (ref 98–111)
Creatinine: 1.17 mg/dL (ref 0.61–1.24)
GFR, Estimated: >60 mL/min (ref >60)
Glucose, Bld: 156 mg/dL — ABNORMAL HIGH (ref 70–99)
Potassium: 4.5 mmol/L (ref 3.5–5.1)
Sodium: 140 mmol/L (ref 135–145)
Total Bilirubin: 0.7 mg/dL (ref 0.3–1.2)
Total Protein: 7.5 g/dL (ref 6.5–8.1)

## 2020-10-02 LAB — LACTATE DEHYDROGENASE: LDH: 159 U/L (ref 98–192)

## 2020-10-02 NOTE — Progress Notes (Signed)
CRITICAL VALUE STICKER  CRITICAL VALUE: WBC 66.9  RECEIVER (on-site recipient of call): Leanne Chang RN  DATE & TIME NOTIFIED: 10/02/20  MESSENGER (representative from lab): Delle Reining RN  MD NOTIFIED: Dr. Irene Limbo  TIME OF NOTIFICATION:  RESPONSE: aware

## 2020-10-02 NOTE — Telephone Encounter (Signed)
Patient called back after appointment to state that he needs a referral for as dermatologist. Dermatology referral was sent to [facility] and patient made aware.

## 2020-10-02 NOTE — Progress Notes (Signed)
Critical value received from lab, WBC 66.9.  Leanne Chang, RN notified @ [phone].

## 2020-10-12 ENCOUNTER — Ambulatory Visit (INDEPENDENT_AMBULATORY_CARE_PROVIDER_SITE_OTHER): Payer: Medicare HMO | Admitting: Osteopathic Medicine

## 2020-10-12 DIAGNOSIS — Z0000 Encounter for general adult medical examination without abnormal findings: Secondary | ICD-10-CM

## 2020-10-12 NOTE — Progress Notes (Addendum)
MEDICARE ANNUAL WELLNESS VISIT  10/12/2020  Telephone Visit Disclaimer This Medicare AWV was conducted by telephone due to national recommendations for restrictions regarding the COVID-19 Pandemic (e.g. social distancing).  I verified, using two identifiers, that I am speaking with Tony Banks or their authorized healthcare agent. I discussed the limitations, risks, security, and privacy concerns of performing an evaluation and management service by telephone and the potential availability of an in-person appointment in the future. The patient expressed understanding and agreed to proceed.  Location of Patient: home Location of Provider (nurse):  In the office  Subjective:    Tony Banks is a 80 y.o. male patient of Tony Reeve, DO who had a Medicare Annual Wellness Visit today via telephone. Tony Banks is Retired and lives with their spouse. he has 3 children. he reports that he is socially active and does interact with friends/family regularly. he is moderately physically active and enjoys reading and riding his stationary bike.  Patient Care Team: Tony Reeve, DO as PCP - General (Osteopathic Medicine)  Advanced Directives 10/12/2020 06/04/2020 11/15/2019 10/07/2019 06/22/2017 05/13/2014 05/02/2014  Does Patient Have a Medical Advance Directive? No No No No No No No  Would patient like information on creating a medical advance directive? No - Patient declined - Yes (MAU/Ambulatory/Procedural Areas - Information given) No - Patient declined No - Patient declined No - patient declined information Yes - Educational materials given    Hospital Utilization Over the Past 12 Months: # of hospitalizations or ER visits: 0 # of surgeries: 0  Review of Systems    Patient reports that his overall health is worse compared to last year.  History obtained from chart review and the patient  Patient Reported Readings (BP, Pulse, CBG, Weight, etc) none  Pain Assessment Pain : No/denies pain      Current Medications & Allergies (verified) Allergies as of 10/12/2020      Reactions   Shellfish Allergy Other (See Comments)   Leg swelling       Medication List       Accurate as of October 12, 2020 10:25 AM. If you have any questions, ask your nurse or doctor.        Accu-Chek Aviva Plus test strip Generic drug: glucose blood USE DAILY OR UP TO 4 TIMES PER DAY AS DIRECTED). USE AS INSTRUCTED   Accu-Chek Softclix Lancets lancets Use as instructed   AMBULATORY NON FORMULARY MEDICATION Accu-chek aviva plus test strips and accu-chek softclix lancets, use to check blood sugar up to four times a day. Dx Type 2 diabetes.   atorvastatin 10 MG tablet Commonly known as: LIPITOR TAKE 1 TABLET (10 MG TOTAL) BY MOUTH DAILY. DUE FOR LAB WORK   enalapril 2.5 MG tablet Commonly known as: VASOTEC TAKE 1 TABLET BY MOUTH EVERY DAY   FISH OIL PO Take by mouth.   metFORMIN 500 MG tablet Commonly known as: GLUCOPHAGE TAKE 2 TABLETS BY MOUTH EVERY DAY WITH BREAKFAST   multivitamin with minerals Tabs tablet Take 1 tablet daily by mouth.   polyethylene glycol 17 g packet Commonly known as: MiraLax Take 17 g by mouth daily.   sildenafil 100 MG tablet Commonly known as: Viagra Take 0.5-1 tablets (50-100 mg total) by mouth daily as needed for erectile dysfunction.       History (reviewed): Past Medical History:  Diagnosis Date  . Aortic atherosclerosis ([place])   . CLL (chronic lymphocytic leukemia) ([place])   . Constipation    severe  . Diabetes  mellitus without complication ([place])   . Diverticulosis   . HLD (hyperlipidemia)   . Hypertension   . Nephrolithiasis    Past Surgical History:  Procedure Laterality Date  . FINGER FRACTURE SURGERY Right    middle finger  . SHOULDER ARTHROSCOPY Bilateral   . VASECTOMY     Family History  Problem Relation Age of Onset  . Heart disease Mother   . Stroke Mother   . Cancer Father   . Stroke Maternal Grandfather   . Colon cancer  Neg Hx   . Pancreatic cancer Neg Hx   . Rectal cancer Neg Hx   . Stomach cancer Neg Hx    Social History   Socioeconomic History  . Marital status: Married    Spouse name: [name]  . Number of children: 3  . Years of education: 26  . Highest education level: Some college, no degree  Occupational History  . Occupation: retired  Tobacco Use  . Smoking status: Former Smoker    Quit date: 07/31/1977    Years since quitting: 43.2  . Smokeless tobacco: Never Used  . Tobacco comment: quit 30 yrs ago  Vaping Use  . Vaping Use: Never used  Substance and Sexual Activity  . Alcohol use: No  . Drug use: No  . Sexual activity: Never    Birth control/protection: Abstinence  Other Topics Concern  . Not on file  Social History Narrative   Drinks 1-2 cups of coffee. Enjoys reading, watching tv and likes to ride his stationary bike.    Social Determinants of Health   Financial Resource Strain: Low Risk   . Difficulty of Paying Living Expenses: Not hard at all  Food Insecurity: No Food Insecurity  . Worried About Charity fundraiser in the Last Year: Never true  . Ran Out of Food in the Last Year: Never true  Transportation Needs: No Transportation Needs  . Lack of Transportation (Medical): No  . Lack of Transportation (Non-Medical): No  Physical Activity: Sufficiently Active  . Days of Exercise per Week: 7 days  . Minutes of Exercise per Session: 80 min  Stress: No Stress Concern Present  . Feeling of Stress : Not at all  Social Connections: Moderately Isolated  . Frequency of Communication with Friends and Family: Once a week  . Frequency of Social Gatherings with Friends and Family: Once a week  . Attends Religious Services: More than 4 times per year  . Active Member of Clubs or Organizations: No  . Attends Archivist Meetings: Never  . Marital Status: Married    Activities of Daily Living In your present state of health, do you have any difficulty performing the  following activities: 10/12/2020  Hearing? N  Vision? N  Difficulty concentrating or making decisions? N  Walking or climbing stairs? N  Dressing or bathing? N  Doing errands, shopping? N  Preparing Food and eating ? N  Using the Toilet? N  In the past six months, have you accidently leaked urine? N  Do you have problems with loss of bowel control? N  Managing your Medications? N  Managing your Finances? N  Housekeeping or managing your Housekeeping? N  Some recent data might be hidden    Patient Education/ Literacy How often do you need to have someone help you when you read instructions, pamphlets, or other written materials from your doctor or pharmacy?: 1 - Never What is the last grade level you completed in school?: Some college  Exercise Current Exercise Habits: Home exercise routine, Type of exercise: strength training/weights;Other - see comments (stationary bike), Time (Minutes): > 60, Frequency (Times/Week): 7, Weekly Exercise (Minutes/Week): 0, Intensity: Moderate, Exercise limited by: None identified  Diet Patient reports consuming 3 meals a day and 3 snack(s) a day Patient reports that his primary diet is: Regular Patient reports that she does have regular access to food.   Depression Screen PHQ 2/9 Scores 10/12/2020 10/07/2019 10/29/2018 04/26/2018 10/27/2017 04/26/2017 04/26/2016  PHQ - 2 Score 0 0 0 0 0 0 0  PHQ- 9 Score - - 3 6 0 - -     Fall Risk Fall Risk  10/12/2020 10/07/2019 04/26/2017 05/02/2014  Falls in the past year? 0 1 No No  Number falls in past yr: 0 0 - -  Comment - slipped on wet mat outside - -  Injury with Fall? 0 0 - -  Risk for fall due to : No Fall Risks No Fall Risks - -  Follow up Falls evaluation completed;Education provided Falls prevention discussed - -     Objective:  Emmanuell D Rathod seemed alert and oriented and he participated appropriately during our telephone visit.  Blood Pressure Weight BMI  BP Readings from Last 3 Encounters:  10/02/20  (!) 157/65  07/02/20 (!) 145/70  06/04/20 133/64   Wt Readings from Last 3 Encounters:  10/02/20 157 lb 4.8 oz (71.4 kg)  07/02/20 156 lb 0.6 oz (70.8 kg)  06/04/20 153 lb 14.4 oz (69.8 kg)   BMI Readings from Last 1 Encounters:  10/02/20 23.92 kg/m    *Unable to obtain current vital signs, weight, and BMI due to telephone visit type  Hearing/Vision  . Orhan did not seem to have difficulty with hearing/understanding during the telephone conversation . Reports that he has not had a formal eye exam by an eye care professional within the past year . Reports that he has not had a formal hearing evaluation within the past year *Unable to fully assess hearing and vision during telephone visit type  Cognitive Function: 6CIT Screen 10/12/2020 10/07/2019 04/26/2017  What Year? 0 points 0 points 0 points  What month? 0 points 0 points 0 points  What time? 0 points 0 points 0 points  Count back from 20 0 points 0 points 0 points  Months in reverse 0 points 0 points 0 points  Repeat phrase 0 points 0 points 0 points  Total Score 0 0 0   (Normal:0-7, Significant for Dysfunction: >8)  Normal Cognitive Function Screening: Yes   Immunization & Health Maintenance Record Immunization History  Administered Date(s) Administered  . Fluad Quad(high Dose 65+) 07/17/2019  . Influenza, High Dose Seasonal PF 06/09/2017, 06/27/2018  . Influenza-Unspecified 07/10/2015, 07/06/2016, 06/15/2020  . PFIZER(Purple Top)SARS-COV-2 Vaccination 11/30/2019, 12/21/2019, 06/15/2020  . Pneumococcal Conjugate-13 04/26/2016  . Pneumococcal Polysaccharide-23 04/27/2014    Health Maintenance  Topic Date Due  . OPHTHALMOLOGY EXAM  10/12/2020 (Originally 05/01/2019)  . FOOT EXAM  10/12/2021 (Originally 10/30/2019)  . Hepatitis C Screening  10/12/2021 (Originally [birth year])  . TETANUS/TDAP  04/05/2025 (Originally 11/18/1959)  . COVID-19 Vaccine (4 - Booster for Pfizer series) 12/14/2020  . HEMOGLOBIN A1C  12/31/2020  .  INFLUENZA VACCINE  Completed  . PNA vac Low Risk Adult  Completed       Assessment  This is a routine wellness examination for Paras D Beske.  Health Maintenance: Due or Overdue There are no preventive care reminders to display for this patient.  Tony Blanch  [facility] does not need a referral for Community Assistance: Care Management:   no Social Work:    no Prescription Assistance:  no Nutrition/Diabetes Education:  no   Plan:  Personalized Goals Goals Addressed              This Visit's Progress   .  Patient Stated (pt-stated)        10/12/2020 AWV Goal: Diabetes Management  . Patient will maintain an A1C level below 8.0 . Patient will not develop any diabetic foot complications . Patient will not experience any hypoglycemic episodes over the next 3 months . Patient will notify our office of any CBG readings outside of the provider recommended range by calling [phone] . Patient will adhere to provider recommendations for diabetes management  Patient Self Management Activities . take all medications as prescribed and report any negative side effects . monitor and record blood sugar readings as directed . adhere to a low carbohydrate diet that incorporates lean proteins, vegetables, whole grains, low glycemic fruits . check feet daily noting any sores, cracks, injuries, or callous formations . see PCP or podiatrist if he notices any changes in his legs, feet, or toenails . Patient will visit PCP and have an A1C level checked every 3 to 6 months as directed  . have a yearly eye exam to monitor for vascular changes associated with diabetes and will request that the report be sent to his pcp.  . consult with his PCP regarding any changes in his health or new or worsening symptoms       Personalized Health Maintenance & Screening Recommendations  Shingles vaccine, eye exam, Hep C screening  Lung Cancer Screening Recommended: no (Low Dose CT Chest recommended if Age 9-80  years, 30 pack-year currently smoking OR have quit w/in past 15 years) Hepatitis C Screening recommended: yes HIV Screening recommended: no  Advanced Directives: Written information was not prepared per patient's request.  Referrals & Orders No orders of the defined types were placed in this encounter.   Follow-up Plan . Follow-up with Tony Reeve, DO as planned . Hepatitis C screening will be done at your next lab appointment.    I have personally reviewed and noted the following in the patient's chart:   . Medical and social history . Use of alcohol, tobacco or illicit drugs  . Current medications and supplements . Functional ability and status . Nutritional status . Physical activity . Advanced directives . List of other physicians . Hospitalizations, surgeries, and ER visits in previous 12 months . Vitals . Screenings to include cognitive, depression, and falls . Referrals and appointments  In addition, I have reviewed and discussed with Neithan D Goodrich certain preventive protocols, quality metrics, and best practice recommendations. A written personalized care plan for preventive services as well as general preventive health recommendations is available and can be mailed to the patient at his request.      Tinnie Gens  10/12/2020

## 2020-10-12 NOTE — Patient Instructions (Addendum)
Diabetes Mellitus and Foot Care Foot care is an important part of your health, especially when you have diabetes. Diabetes may cause you to have problems because of poor blood flow (circulation) to your feet and legs, which can cause your skin to:  Become thinner and drier.  Break more easily.  Heal more slowly.  Peel and crack. You may also have nerve damage (neuropathy) in your legs and feet, causing decreased feeling in them. This means that you may not notice minor injuries to your feet that could lead to more serious problems. Noticing and addressing any potential problems early is the best way to prevent future foot problems. How to care for your feet Foot hygiene  Wash your feet daily with warm water and mild soap. Do not use hot water. Then, pat your feet and the areas between your toes until they are completely dry. Do not soak your feet as this can dry your skin.  Trim your toenails straight across. Do not dig under them or around the cuticle. File the edges of your nails with an emery board or nail file.  Apply a moisturizing lotion or petroleum jelly to the skin on your feet and to dry, brittle toenails. Use lotion that does not contain alcohol and is unscented. Do not apply lotion between your toes.   Shoes and socks  Wear clean socks or stockings every day. Make sure they are not too tight. Do not wear knee-high stockings since they may decrease blood flow to your legs.  Wear shoes that fit properly and have enough cushioning. Always look in your shoes before you put them on to be sure there are no objects inside.  To break in new shoes, wear them for just a few hours a day. This prevents injuries on your feet. Wounds, scrapes, corns, and calluses  Check your feet daily for blisters, cuts, bruises, sores, and redness. If you cannot see the bottom of your feet, use a mirror or ask someone for help.  Do not cut corns or calluses or try to remove them with medicine.  If you  find a minor scrape, cut, or break in the skin on your feet, keep it and the skin around it clean and dry. You may clean these areas with mild soap and water. Do not clean the area with peroxide, alcohol, or iodine.  If you have a wound, scrape, corn, or callus on your foot, look at it several times a day to make sure it is healing and not infected. Check for: ? Redness, swelling, or pain. ? Fluid or blood. ? Warmth. ? Pus or a bad smell.   General tips  Do not cross your legs. This may decrease blood flow to your feet.  Do not use heating pads or hot water bottles on your feet. They may burn your skin. If you have lost feeling in your feet or legs, you may not know this is happening until it is too late.  Protect your feet from hot and cold by wearing shoes, such as at the beach or on hot pavement.  Schedule a complete foot exam at least once a year (annually) or more often if you have foot problems. Report any cuts, sores, or bruises to your health care provider immediately. Where to find more information  American Diabetes Association: www.diabetes.org  Association of Diabetes Care & Education Specialists: www.diabeteseducator.org Contact a health care provider if:  You have a medical condition that increases your risk of infection and   you have any cuts, sores, or bruises on your feet.  You have an injury that is not healing.  You have redness on your legs or feet.  You feel burning or tingling in your legs or feet.  You have pain or cramps in your legs and feet.  Your legs or feet are numb.  Your feet always feel cold.  You have pain around any toenails. Get help right away if:  You have a wound, scrape, corn, or callus on your foot and: ? You have pain, swelling, or redness that gets worse. ? You have fluid or blood coming from the wound, scrape, corn, or callus. ? Your wound, scrape, corn, or callus feels warm to the touch. ? You have pus or a bad smell coming from  the wound, scrape, corn, or callus. ? You have a fever. ? You have a red line going up your leg. Summary  Check your feet every day for blisters, cuts, bruises, sores, and redness.  Apply a moisturizing lotion or petroleum jelly to the skin on your feet and to dry, brittle toenails.  Wear shoes that fit properly and have enough cushioning.  If you have foot problems, report any cuts, sores, or bruises to your health care provider immediately.  Schedule a complete foot exam at least once a year (annually) or more often if you have foot problems. This information is not intended to replace advice given to you by your health care provider. Make sure you discuss any questions you have with your health care provider. Document Revised: 03/12/2020 Document Reviewed: 03/12/2020 Elsevier Patient Education  [place] Maintenance, Male Adopting a healthy lifestyle and getting preventive care are important in promoting health and wellness. Ask your health care provider about:  The right schedule for you to have regular tests and exams.  Things you can do on your own to prevent diseases and keep yourself healthy. What should I know about diet, weight, and exercise? Eat a healthy diet  Eat a diet that includes plenty of vegetables, fruits, low-fat dairy products, and lean protein.  Do not eat a lot of foods that are high in solid fats, added sugars, or sodium.   Maintain a healthy weight Body mass index (BMI) is a measurement that can be used to identify possible weight problems. It estimates body fat based on height and weight. Your health care provider can help determine your BMI and help you achieve or maintain a healthy weight. Get regular exercise Get regular exercise. This is one of the most important things you can do for your health. Most adults should:  Exercise for at least 150 minutes each week. The exercise should increase your heart rate and make you sweat  (moderate-intensity exercise).  Do strengthening exercises at least twice a week. This is in addition to the moderate-intensity exercise.  Spend less time sitting. Even light physical activity can be beneficial. Watch cholesterol and blood lipids Have your blood tested for lipids and cholesterol at 79 years of age, then have this test every 5 years. You may need to have your cholesterol levels checked more often if:  Your lipid or cholesterol levels are high.  You are older than 79 years of age.  You are at high risk for heart disease. What should I know about cancer screening? Many types of cancers can be detected early and may often be prevented. Depending on your health history and family history, you may need to have cancer  screening at various ages. This may include screening for:  Colorectal cancer.  Prostate cancer.  Skin cancer.  Lung cancer. What should I know about heart disease, diabetes, and high blood pressure? Blood pressure and heart disease  High blood pressure causes heart disease and increases the risk of stroke. This is more likely to develop in people who have high blood pressure readings, are of African descent, or are overweight.  Talk with your health care provider about your target blood pressure readings.  Have your blood pressure checked: ? Every 3-5 years if you are 59-26 years of age. ? Every year if you are 60 years old or older.  If you are between the ages of 14 and 56 and are a current or former smoker, ask your health care provider if you should have a one-time screening for abdominal aortic aneurysm (AAA). Diabetes Have regular diabetes screenings. This checks your fasting blood sugar level. Have the screening done:  Once every three years after age 59 if you are at a normal weight and have a low risk for diabetes.  More often and at a younger age if you are overweight or have a high risk for diabetes. What should I know about preventing  infection? Hepatitis B If you have a higher risk for hepatitis B, you should be screened for this virus. Talk with your health care provider to find out if you are at risk for hepatitis B infection. Hepatitis C Blood testing is recommended for:  Everyone born from [birth year] through 1964.  Anyone with known risk factors for hepatitis C. Sexually transmitted infections (STIs)  You should be screened each year for STIs, including gonorrhea and chlamydia, if: ? You are sexually active and are younger than 79 years of age. ? You are older than 79 years of age and your health care provider tells you that you are at risk for this type of infection. ? Your sexual activity has changed since you were last screened, and you are at increased risk for chlamydia or gonorrhea. Ask your health care provider if you are at risk.  Ask your health care provider about whether you are at high risk for HIV. Your health care provider may recommend a prescription medicine to help prevent HIV infection. If you choose to take medicine to prevent HIV, you should first get tested for HIV. You should then be tested every 3 months for as long as you are taking the medicine. Follow these instructions at home: Lifestyle  Do not use any products that contain nicotine or tobacco, such as cigarettes, e-cigarettes, and chewing tobacco. If you need help quitting, ask your health care provider.  Do not use street drugs.  Do not share needles.  Ask your health care provider for help if you need support or information about quitting drugs. Alcohol use  Do not drink alcohol if your health care provider tells you not to drink.  If you drink alcohol: ? Limit how much you have to 0-2 drinks a day. ? Be aware of how much alcohol is in your drink. In the [place] one drink equals one 12 oz bottle of beer (355 mL), one 5 oz glass of wine (148 mL), or one 1 oz glass of hard liquor (44 mL). General instructions  Schedule regular health,  dental, and eye exams.  Stay current with your vaccines.  Tell your health care provider if: ? You often feel depressed. ? You have ever been abused or do not feel safe  at home. Summary  Adopting a healthy lifestyle and getting preventive care are important in promoting health and wellness.  Follow your health care provider's instructions about healthy diet, exercising, and getting tested or screened for diseases.  Follow your health care provider's instructions on monitoring your cholesterol and blood pressure. This information is not intended to replace advice given to you by your health care provider. Make sure you discuss any questions you have with your health care provider. Document Revised: 08/15/2018 Document Reviewed: 08/15/2018 Elsevier Patient Education  2021 [place] Maintenance Summary and Written Plan of Care  Mr. Iqbal ,  Thank you for allowing me to perform your Medicare Annual Wellness Visit and for your ongoing commitment to your health.   Health Maintenance & Immunization History Health Maintenance  Topic Date Due  . OPHTHALMOLOGY EXAM  10/12/2020 (Originally 05/01/2019)  . FOOT EXAM  10/12/2021 (Originally 10/30/2019)  . Hepatitis C Screening  10/12/2021 (Originally [birth year])  . TETANUS/TDAP  04/05/2025 (Originally 11/18/1959)  . COVID-19 Vaccine (4 - Booster for Pfizer series) 12/14/2020  . HEMOGLOBIN A1C  12/31/2020  . INFLUENZA VACCINE  Completed  . PNA vac Low Risk Adult  Completed   Immunization History  Administered Date(s) Administered  . Fluad Quad(high Dose 65+) 07/17/2019  . Influenza, High Dose Seasonal PF 06/09/2017, 06/27/2018  . Influenza-Unspecified 07/10/2015, 07/06/2016, 06/15/2020  . PFIZER(Purple Top)SARS-COV-2 Vaccination 11/30/2019, 12/21/2019, 06/15/2020  . Pneumococcal Conjugate-13 04/26/2016  . Pneumococcal Polysaccharide-23 04/27/2014    These are the patient goals that we  discussed: Goals Addressed              This Visit's Progress   .  Patient Stated (pt-stated)        10/12/2020 AWV Goal: Diabetes Management  . Patient will maintain an A1C level below 8.0 . Patient will not develop any diabetic foot complications . Patient will not experience any hypoglycemic episodes over the next 3 months . Patient will notify our office of any CBG readings outside of the provider recommended range by calling [phone] . Patient will adhere to provider recommendations for diabetes management  Patient Self Management Activities . take all medications as prescribed and report any negative side effects . monitor and record blood sugar readings as directed . adhere to a low carbohydrate diet that incorporates lean proteins, vegetables, whole grains, low glycemic fruits . check feet daily noting any sores, cracks, injuries, or callous formations . see PCP or podiatrist if he notices any changes in his legs, feet, or toenails . Patient will visit PCP and have an A1C level checked every 3 to 6 months as directed  . have a yearly eye exam to monitor for vascular changes associated with diabetes and will request that the report be sent to his pcp.  . consult with his PCP regarding any changes in his health or new or worsening symptoms         This is a list of Health Maintenance Items that are overdue or due now: Shingles vaccine, eye exam, Hep C screening  Orders/Referrals Placed Today: No orders of the defined types were placed in this encounter.  Follow-up Plan . Follow-up with Emeterio Reeve, DO as planned . Hepatitis C screening will be done at your next lab appointment.

## 2020-11-02 ENCOUNTER — Other Ambulatory Visit: Payer: Self-pay

## 2020-11-02 ENCOUNTER — Ambulatory Visit (INDEPENDENT_AMBULATORY_CARE_PROVIDER_SITE_OTHER): Payer: Medicare HMO | Admitting: Osteopathic Medicine

## 2020-11-02 ENCOUNTER — Encounter: Payer: Self-pay | Admitting: Osteopathic Medicine

## 2020-11-02 VITALS — BP 159/78 | HR 82 | Temp 97.9°F | Wt 158.0 lb

## 2020-11-02 DIAGNOSIS — E785 Hyperlipidemia, unspecified: Secondary | ICD-10-CM | POA: Diagnosis not present

## 2020-11-02 DIAGNOSIS — E119 Type 2 diabetes mellitus without complications: Secondary | ICD-10-CM | POA: Diagnosis not present

## 2020-11-02 DIAGNOSIS — C9110 Chronic lymphocytic leukemia of B-cell type not having achieved remission: Secondary | ICD-10-CM

## 2020-11-02 DIAGNOSIS — E1159 Type 2 diabetes mellitus with other circulatory complications: Secondary | ICD-10-CM | POA: Diagnosis not present

## 2020-11-02 DIAGNOSIS — I152 Hypertension secondary to endocrine disorders: Secondary | ICD-10-CM

## 2020-11-02 LAB — POCT GLYCOSYLATED HEMOGLOBIN (HGB A1C): Hemoglobin A1C: 6.7 % — AB (ref 4.0–5.6)

## 2020-11-02 LAB — LIPID PANEL
Cholesterol: 114 mg/dL (ref <200)
HDL: 31 mg/dL — ABNORMAL LOW (ref >=40)
LDL Cholesterol (Calc): 64 mg/dL (calc)
Non-HDL Cholesterol (Calc): 83 mg/dL (calc) (ref <130)
Total CHOL/HDL Ratio: 3.7 (calc) (ref <5.0)
Triglycerides: 104 mg/dL (ref <150)

## 2020-11-02 NOTE — Patient Instructions (Addendum)
1. Type 2 diabetes A1C today looks good! Continue current medications. Will recheck in another 4 months.   2. Hypertension  BP a bit above goal today, be sure to take medications consistently Goal 140/90 or less You are on low dose enalapril, if BP continues to be above goal we can double this from 2.5 mg dialy to 5 mg dialy   3. Hyperlipidemia Rechecking cholesterol levels today  4. CLL (chronic lymphocytic leukemia)  Continue follow-up with Oncology!   5. Shingles vaccine Ask your pharmacy - Medicare will NOT pay for this vaccine in the doctor's office  6. Pneumonia vaccines You're all done with these!

## 2020-11-02 NOTE — Progress Notes (Signed)
Tony Banks is a 80 y.o. male who presents to  [place] at [facility]  today, 11/02/20, seeking care for the following:  . DM2 - metformin 1000 mg daily, last A1C 6.2% four mos ago 06/2020, today 11/02/20  . HTN - enalapril 2.5 mg, nota taken today, BP is above goal but pt reports looks good at home typically 120's/70s . HLD - atorvastatin 10 mg, Omega-3 . CLL - following w/ HemOnc, recent notes reviewed, CLL stable but WBC trending up advised f/u 5 mos, advised f/u w/ PCP re: Pneumovax, Shingrix. They also referred to dermatology   Immunization History  Administered Date(s) Administered  . Fluad Quad(high Dose 65+) 07/17/2019  . Influenza, High Dose Seasonal PF 06/09/2017, 06/27/2018  . Influenza-Unspecified 07/10/2015, 07/06/2016, 06/15/2020  . PFIZER(Purple Top)SARS-COV-2 Vaccination 11/30/2019, 12/21/2019, 06/15/2020  . Pneumococcal Conjugate-13 04/26/2016  . Pneumococcal Polysaccharide-23 04/27/2014     ASSESSMENT & PLAN with other pertinent findings:  The primary encounter diagnosis was Type 2 diabetes mellitus without complication, without long-term current use of insulin ([place]). Diagnoses of Hypertension associated with diabetes ([place]), Hyperlipidemia, unspecified hyperlipidemia type, and CLL (chronic lymphocytic leukemia) ([place]) were also pertinent to this visit.    Patient Instructions  1. Type 2 diabetes A1C today looks good! Continue current medications. Will recheck in another 4 months.   2. Hypertension  BP a bit above goal today, be sure to take medications consistently Goal 140/90 or less You are on low dose enalapril, if BP continues to be above goal we can double this from 2.5 mg dialy to 5 mg dialy   3. Hyperlipidemia Rechecking cholesterol levels today  4. CLL (chronic lymphocytic leukemia)  Continue follow-up with Oncology!   5. Shingles vaccine Ask your pharmacy - Medicare will NOT pay for this vaccine in the  doctor's office  6. Pneumonia vaccines You're all done with these!     Orders Placed This Encounter  Procedures  . Lipid panel  . POCT HgB A1C    No orders of the defined types were placed in this encounter.    See below for relevant physical exam findings  See below for recent lab and imaging results reviewed  Medications, allergies, PMH, PSH, SocH, FamH reviewed below    Follow-up instructions: Return in about 4 months (around 03/02/2021) for MONITOR A1C AND BP, SEE [place] SOONER IF NEEDED. CALL/MESSAGE W/ QUESTIONS.                                        Exam:  BP (!) 159/78 (BP Location: Left Arm, Patient Position: Sitting, Cuff Size: Normal)   Pulse 82   Temp 97.9 F (36.6 C) (Oral)   Wt 158 lb (71.7 kg)   BMI 24.02 kg/m   Constitutional: VS see above. General Appearance: alert, well-developed, well-nourished, NAD  Neck: No masses, trachea midline.   Respiratory: Normal respiratory effort. no wheeze, no rhonchi, no rales  Cardiovascular: S1/S2 normal, no murmur, no rub/gallop auscultated. RRR.   Musculoskeletal: Gait normal.  Neurological: Normal balance/coordination. No tremor.  Skin: warm, dry, intact.   Psychiatric: Normal judgment/insight. Normal mood and affect. Oriented x3.   Current Meds  Medication Sig  . ACCU-CHEK AVIVA PLUS test strip USE DAILY OR UP TO 4 TIMES PER DAY AS DIRECTED). USE AS INSTRUCTED  . ACCU-CHEK SOFTCLIX LANCETS lancets Use as instructed  . AMBULATORY NON  FORMULARY MEDICATION Accu-chek aviva plus test strips and accu-chek softclix lancets, use to check blood sugar up to four times a day. Dx Type 2 diabetes.  Marland Kitchen atorvastatin (LIPITOR) 10 MG tablet TAKE 1 TABLET (10 MG TOTAL) BY MOUTH DAILY. DUE FOR LAB WORK  . enalapril (VASOTEC) 2.5 MG tablet TAKE 1 TABLET BY MOUTH EVERY DAY  . metFORMIN (GLUCOPHAGE) 500 MG tablet TAKE 2 TABLETS BY MOUTH EVERY DAY WITH BREAKFAST  . Multiple Vitamin (MULTIVITAMIN  WITH MINERALS) TABS tablet Take 1 tablet daily by mouth.  . Omega-3 Fatty Acids (FISH OIL PO) Take by mouth.  . polyethylene glycol (MIRALAX) packet Take 17 g by mouth daily.  . sildenafil (VIAGRA) 100 MG tablet Take 0.5-1 tablets (50-100 mg total) by mouth daily as needed for erectile dysfunction.    Allergies  Allergen Reactions  . Shellfish Allergy Other (See Comments)    Leg swelling     Patient Active Problem List   Diagnosis Date Noted  . Advance directive discussed with patient 04/26/2018  . Chalazion left upper eyelid 08/18/2016  . Type 2 diabetes mellitus ([place]) 07/13/2015  . Hyperlipidemia 04/07/2015  . Hearing loss 04/06/2015  . Hypertension associated with diabetes ([place]) 10/27/2014  . Abdominal pain 04/26/2014  . Constipation 04/26/2014  . CLL (chronic lymphocytic leukemia) ([name]) 04/26/2014    Family History  Problem Relation Age of Onset  . Heart disease Mother   . Stroke Mother   . Cancer Father   . Stroke Maternal Grandfather   . Colon cancer Neg Hx   . Pancreatic cancer Neg Hx   . Rectal cancer Neg Hx   . Stomach cancer Neg Hx     Social History   Tobacco Use  Smoking Status Former Smoker  . Quit date: 07/31/1977  . Years since quitting: 43.2  Smokeless Tobacco Never Used  Tobacco Comment   quit 30 yrs ago    Past Surgical History:  Procedure Laterality Date  . FINGER FRACTURE SURGERY Right    middle finger  . SHOULDER ARTHROSCOPY Bilateral   . VASECTOMY      Immunization History  Administered Date(s) Administered  . Fluad Quad(high Dose 65+) 07/17/2019  . Influenza, High Dose Seasonal PF 06/09/2017, 06/27/2018  . Influenza-Unspecified 07/10/2015, 07/06/2016, 06/15/2020  . PFIZER(Purple Top)SARS-COV-2 Vaccination 11/30/2019, 12/21/2019, 06/15/2020  . Pneumococcal Conjugate-13 04/26/2016  . Pneumococcal Polysaccharide-23 04/27/2014    Recent Results (from the past 2160 hour(s))  Lactate dehydrogenase     Status: None   Collection Time:  10/02/20  9:00 AM  Result Value Ref Range   LDH 159 98 - 192 U/L    Comment: Performed at [facility], [address], [place], [place] [address]  CMP ([place] only)     Status: Abnormal   Collection Time: 10/02/20  9:00 AM  Result Value Ref Range   Sodium 140 135 - 145 mmol/L   Potassium 4.5 3.5 - 5.1 mmol/L   Chloride 107 98 - 111 mmol/L   CO2 24 22 - 32 mmol/L   Glucose, Bld 156 (H) 70 - 99 mg/dL    Comment: Glucose reference range applies only to samples taken after fasting for at least 8 hours.   BUN 23 8 - 23 mg/dL   Creatinine 1.17 0.61 - 1.24 mg/dL   Calcium 9.4 8.9 - 10.3 mg/dL   Total Protein 7.5 6.5 - 8.1 g/dL   Albumin 4.3 3.5 - 5.0 g/dL   AST 21 15 - 41 U/L  ALT 20 0 - 44 U/L   Alkaline Phosphatase 92 38 - 126 U/L   Total Bilirubin 0.7 0.3 - 1.2 mg/dL   GFR, Estimated >60 >60 mL/min    Comment: (NOTE) Calculated using the CKD-EPI Creatinine Equation (2021)    Anion gap 9 5 - 15    Comment: Performed at [facility], [place] [address] [place], [place] [address]  CBC with Differential/Platelet     Status: Abnormal   Collection Time: 10/02/20  9:00 AM  Result Value Ref Range   WBC 66.9 (HH) 4.0 - 10.5 K/uL    Comment: This critical result has verified and been called to Margaretmary Eddy, RN by Rolland Porter on 01 28 2022 at 0921, and has been read back.    RBC 4.07 (L) 4.22 - 5.81 MIL/uL   Hemoglobin 12.6 (L) 13.0 - 17.0 g/dL   HCT 38.8 (L) 39.0 - 52.0 %   MCV 95.3 80.0 - 100.0 fL   MCH 31.0 26.0 - 34.0 pg   MCHC 32.5 30.0 - 36.0 g/dL   RDW 14.5 11.5 - 15.5 %   Platelets 160 150 - 400 K/uL   nRBC 0.0 0.0 - 0.2 %   Neutrophils Relative % 11 %   Neutro Abs 7.0 1.7 - 7.7 K/uL   Lymphocytes Relative 80 %   Lymphs Abs 54.1 (H) 0.7 - 4.0 K/uL   Monocytes Relative 8 %   Monocytes Absolute 5.2 (H) 0.1 - 1.0 K/uL   Eosinophils Relative 0 %   Eosinophils Absolute 0.2 0.0 - 0.5 K/uL   Basophils Relative 0 %    Basophils Absolute 0.1 0.0 - 0.1 K/uL   WBC Morphology LARGE VARIANT LYMPHS    Immature Granulocytes 1 %   Abs Immature Granulocytes 0.44 (H) 0.00 - 0.07 K/uL   Smudge Cells PRESENT     Comment: Performed at [facility], [place] [address], [place], [place] [address]  POCT HgB A1C     Status: Abnormal   Collection Time: 11/02/20  8:34 AM  Result Value Ref Range   Hemoglobin A1C 6.7 (A) 4.0 - 5.6 %   HbA1c POC (<> result, manual entry)     HbA1c, POC (prediabetic range)     HbA1c, POC (controlled diabetic range)      No results found.     All questions at time of visit were answered - patient instructed to contact office with any additional concerns or updates. ER/RTC precautions were reviewed with the patient as applicable.   Please note: manual typing as well as voice recognition software may have been used to produce this document - typos may escape review. Please contact Dr. Sheppard Coil for any needed clarifications.

## 2020-12-12 DIAGNOSIS — S5291XA Unspecified fracture of right forearm, initial encounter for closed fracture: Secondary | ICD-10-CM | POA: Diagnosis not present

## 2020-12-14 DIAGNOSIS — S63501A Unspecified sprain of right wrist, initial encounter: Secondary | ICD-10-CM | POA: Diagnosis not present

## 2020-12-21 DIAGNOSIS — S63501D Unspecified sprain of right wrist, subsequent encounter: Secondary | ICD-10-CM | POA: Diagnosis not present

## 2021-01-04 DIAGNOSIS — S63501D Unspecified sprain of right wrist, subsequent encounter: Secondary | ICD-10-CM | POA: Diagnosis not present

## 2021-01-25 DIAGNOSIS — S63501D Unspecified sprain of right wrist, subsequent encounter: Secondary | ICD-10-CM | POA: Diagnosis not present

## 2021-02-12 ENCOUNTER — Encounter: Payer: Self-pay | Admitting: Osteopathic Medicine

## 2021-02-12 ENCOUNTER — Ambulatory Visit (INDEPENDENT_AMBULATORY_CARE_PROVIDER_SITE_OTHER): Payer: Medicare HMO | Admitting: Osteopathic Medicine

## 2021-02-12 ENCOUNTER — Other Ambulatory Visit: Payer: Self-pay

## 2021-02-12 VITALS — BP 144/60 | HR 74 | Temp 98.2°F | Wt 154.0 lb

## 2021-02-12 DIAGNOSIS — E785 Hyperlipidemia, unspecified: Secondary | ICD-10-CM | POA: Diagnosis not present

## 2021-02-12 DIAGNOSIS — E119 Type 2 diabetes mellitus without complications: Secondary | ICD-10-CM | POA: Diagnosis not present

## 2021-02-12 DIAGNOSIS — E1159 Type 2 diabetes mellitus with other circulatory complications: Secondary | ICD-10-CM | POA: Diagnosis not present

## 2021-02-12 DIAGNOSIS — L723 Sebaceous cyst: Secondary | ICD-10-CM

## 2021-02-12 DIAGNOSIS — I152 Hypertension secondary to endocrine disorders: Secondary | ICD-10-CM | POA: Diagnosis not present

## 2021-02-12 LAB — POCT GLYCOSYLATED HEMOGLOBIN (HGB A1C): Hemoglobin A1C: 6.5 % — AB (ref 4.0–5.6)

## 2021-02-12 NOTE — Progress Notes (Signed)
Tony Banks is a 80 y.o. male who presents to  [place] at [facility]  today, 02/12/21, seeking care for the following:  DM2 - metformin 1000 mg daily, last A1C 6.2% on 06/2020, 11/02/20 was 6.7, today 02/12/21 is 6.5 HTN - enalapril 2.5 mg, BP is above goal but pt reports looks good at home typically 120's/70s HLD - atorvastatin 10 mg, Omega-3 CLL - following w/ HemOnc, recent notes reviewed, CLL stable but WBC trending up advised f/u 5 mos, advised f/u w/ PCP re: Pneumovax, Shingrix. They also referred to dermatology  Sebaceous cysts on bck of neck - stable, would like these drained at some point, he used to be able to drain these himself but not in the past couple years. No redness/pain. Noting some more depression and fatigue d/t recently having to have arm immobilized after a fall - he was helping his on in law unload hay bales and a pallet broke underfoot so he fell. He's getting back into exercise and slowly gaining his endurance back to baseline       ASSESSMENT & PLAN with other pertinent findings:  The primary encounter diagnosis was Type 2 diabetes mellitus without complication, without long-term current use of insulin ([place]). Diagnoses of Hypertension associated with diabetes ([place]), Hyperlipidemia, unspecified hyperlipidemia type, and Sebaceous cyst were also pertinent to this visit.   1. Type 2 diabetes mellitus without complication, without long-term current use of insulin (HCC) Stable, controlled  2. Hypertension associated with diabetes ([place]) Stable, controlled  3. Hyperlipidemia, unspecified hyperlipidemia type Need labs next visit  4. Sebaceous cyst Will I&D / remove next visit, appt schedule for 40 minute time slot, pt to RTC if redness/pain   There are no Patient Instructions on file for this visit.  Orders Placed This Encounter  Procedures   POCT HgB A1C    No orders of the defined types were placed in this  encounter.    See below for relevant physical exam findings  See below for recent lab and imaging results reviewed  Medications, allergies, PMH, PSH, SocH, FamH reviewed below    Follow-up instructions: Return in about 6 months (around 08/14/2021) for monitor A1C, skin procedure can do simple I&D sebaceous cysts or cyst removal.                                        Exam:  BP (!) 144/60 (BP Location: Left Arm, Patient Position: Sitting, Cuff Size: Normal)   Pulse 74   Temp 98.2 F (36.8 C) (Oral)   Wt 154 lb 0.6 oz (69.9 kg)   BMI 23.42 kg/m  Constitutional: VS see above. General Appearance: alert, well-developed, well-nourished, NAD Neck: No masses, trachea midline.  Respiratory: Normal respiratory effort. no wheeze, no rhonchi, no rales Cardiovascular: S1/S2 normal, no murmur, no rub/gallop auscultated. RRR.  Musculoskeletal: Gait normal. Symmetric and independent movement of all extremities Neurological: Normal balance/coordination. No tremor. Skin: warm, dry, intact.  Psychiatric: Normal judgment/insight. Normal mood and affect. Oriented x3.   Current Meds  Medication Sig   ACCU-CHEK AVIVA PLUS test strip USE DAILY OR UP TO 4 TIMES PER DAY AS DIRECTED). USE AS INSTRUCTED   ACCU-CHEK SOFTCLIX LANCETS lancets Use as instructed   AMBULATORY NON FORMULARY MEDICATION Accu-chek aviva plus test strips and accu-chek softclix lancets, use to check blood sugar up to four times a day. Dx  Type 2 diabetes.   atorvastatin (LIPITOR) 10 MG tablet TAKE 1 TABLET (10 MG TOTAL) BY MOUTH DAILY. DUE FOR LAB WORK   enalapril (VASOTEC) 2.5 MG tablet TAKE 1 TABLET BY MOUTH EVERY DAY   metFORMIN (GLUCOPHAGE) 500 MG tablet TAKE 2 TABLETS BY MOUTH EVERY DAY WITH BREAKFAST   Multiple Vitamin (MULTIVITAMIN WITH MINERALS) TABS tablet Take 1 tablet daily by mouth.   Omega-3 Fatty Acids (FISH OIL PO) Take by mouth.   polyethylene glycol (MIRALAX) packet Take 17 g by  mouth daily.   sildenafil (VIAGRA) 100 MG tablet Take 0.5-1 tablets (50-100 mg total) by mouth daily as needed for erectile dysfunction.    Allergies  Allergen Reactions   Shellfish Allergy Other (See Comments)    Leg swelling     Patient Active Problem List   Diagnosis Date Noted   Advance directive discussed with patient 04/26/2018   Chalazion left upper eyelid 08/18/2016   Type 2 diabetes mellitus ([place]) 07/13/2015   Hyperlipidemia 04/07/2015   Hearing loss 04/06/2015   Hypertension associated with diabetes ([place]) 10/27/2014   Abdominal pain 04/26/2014   Constipation 04/26/2014   CLL (chronic lymphocytic leukemia) ([place]) 04/26/2014    Family History  Problem Relation Age of Onset   Heart disease Mother    Stroke Mother    Cancer Father    Stroke Maternal Grandfather    Colon cancer Neg Hx    Pancreatic cancer Neg Hx    Rectal cancer Neg Hx    Stomach cancer Neg Hx     Social History   Tobacco Use  Smoking Status Former   Pack years: 0.00   Types: Cigarettes   Quit date: 07/31/1977   Years since quitting: 43.5  Smokeless Tobacco Never  Tobacco Comments   quit 30 yrs ago    Past Surgical History:  Procedure Laterality Date   FINGER FRACTURE SURGERY Right    middle finger   SHOULDER ARTHROSCOPY Bilateral    VASECTOMY      Immunization History  Administered Date(s) Administered   Fluad Quad(high Dose 65+) 07/17/2019   Influenza, High Dose Seasonal PF 06/09/2017, 06/27/2018   Influenza-Unspecified 07/10/2015, 07/06/2016, 06/15/2020   PFIZER(Purple Top)SARS-COV-2 Vaccination 11/30/2019, 12/21/2019, 06/15/2020   Pneumococcal Conjugate-13 04/26/2016   Pneumococcal Polysaccharide-23 04/27/2014    Recent Results (from the past 2160 hour(s))  POCT HgB A1C     Status: Abnormal   Collection Time: 02/12/21  8:20 AM  Result Value Ref Range   Hemoglobin A1C 6.5 (A) 4.0 - 5.6 %   HbA1c POC (<> result, manual entry)     HbA1c, POC (prediabetic range)     HbA1c,  POC (controlled diabetic range)      No results found.     All questions at time of visit were answered - patient instructed to contact office with any additional concerns or updates. ER/RTC precautions were reviewed with the patient as applicable.   Please note: manual typing as well as voice recognition software may have been used to produce this document - typos may escape review. Please contact Dr. Sheppard Coil for any needed clarifications.

## 2021-02-19 ENCOUNTER — Other Ambulatory Visit: Payer: Medicare HMO

## 2021-02-19 ENCOUNTER — Ambulatory Visit: Payer: Medicare HMO | Admitting: Hematology

## 2021-03-05 ENCOUNTER — Telehealth: Payer: Self-pay | Admitting: Osteopathic Medicine

## 2021-03-05 NOTE — Chronic Care Management (AMB) (Signed)
  Chronic Care Management   Note  03/05/2021 Name: YI HAUGAN MRN: [id number] DOB: 25-Jun-1941  Paulette Blanch Baird is a 80 y.o. year old male who is a primary care patient of Emeterio Reeve, DO. I reached out to [place] by phone today in response to a referral sent by Mr. Hollie Bartus Valeriano's PCP, Emeterio Reeve, DO.   Mr. Kimmons was given information about Chronic Care Management services today including:  CCM service includes personalized support from designated clinical staff supervised by his physician, including individualized plan of care and coordination with other care providers 24/7 contact phone numbers for assistance for urgent and routine care needs. Service will only be billed when office clinical staff spend 20 minutes or more in a month to coordinate care. Only one practitioner may furnish and bill the service in a calendar month. The patient may stop CCM services at any time (effective at the end of the month) by phone call to the office staff.   Patient agreed to services and verbal consent obtained.   Follow up plan:   Lauretta Grill Upstream Scheduler

## 2021-03-21 ENCOUNTER — Other Ambulatory Visit: Payer: Self-pay | Admitting: Osteopathic Medicine

## 2021-03-21 DIAGNOSIS — E119 Type 2 diabetes mellitus without complications: Secondary | ICD-10-CM

## 2021-03-21 NOTE — Progress Notes (Signed)
HEMATOLOGY/ONCOLOGY CLINIC NOTE  Date of Service: 03/22/2021  Patient Care Team: Emeterio Reeve, DO as PCP - General (Osteopathic Medicine) [name], [facility] as Pharmacist (Pharmacist)  CHIEF COMPLAINTS/PURPOSE OF CONSULTATION:  Chronic Lymphocytic Leukemia   HISTORY OF PRESENTING ILLNESS:   Tony Banks is a wonderful 80 y.o. male who has been previously followed by my colleague Dr. 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 He is accompanied today by his wife. The pt reports that he is doing well overall. Verbal consent has been given by the pt for a clinical observer, Mathis Fare, to be present.    The pt was diagnosed with CLL in October 2018, following a Peripheral blood flow cytometry and cytogenetics report. 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 The pt notes that his CLL was picked up off of routine labs and was not prompted by any associated symptoms.   The pt reports that he has not had any symptoms or concerns in the last 3 months he denies any constitutional symptoms or noticing any new lumps or bumps.    Most recent lab results (03/22/18) of CBC w/diff, CMP is as follows: all values are WNL except for WBC at 18.4k, Lymphs abs at 11.9k, Glucose at 128. LDH 03/22/18 is normal at 160 QIG 03/22/18 is pending  On review of systems, pt reports good energy levels, and denies fevers, chills, night sweats, unexpected weight loss, noticing any new lumps or bumps, abdominal pains, and any other symptoms.   Interval History:   Tony Banks returns today for management and evaluation of his CLL. The patient's last visit with [place] was on 10/02/2020. The pt reports that he is doing well overall.  The pt reports that he has been experiencing increasing fatigue and muscle weakness. He recently had a fall on a wooden palate and broke his hand while helping someone unload hay bells. The pt  wore a cast and brace for a period of time, but did not get surgery due to it just being a hairline fracture. This occurred in April and took around 8 weeks to recover. This fatigue and weakness has been getting worse in the last months. He notes increasing difficulty mowing his yard without resting. There is much weakness and soreness in his legs that makes it harder to walk. He notes intermittent cramping during the night, which is completely new. He went from spending 30 minutes on his stationary bike to currently no more than 5 minutes at a time.  Due to the fall, the pt notes that he was unable to see the dermatologist. He will f/u regarding a repeat visit for the skin spots on his head.  The pt notes a change in his urination from a steady stream to currently short bursts of urine with some pain. This is new within the last month, within the last week or two. He notes no discomfort. He feels he empties his bladder completely.  Lab results today 03/22/2021 of CBC w/diff and CMP is as follows: all values are WNL except for WBC of 96.4K, RBC of 3.75, Hgb of 11.7, HCT of 35.4, Plt of 137K, Neutro Abs of 7.9K, Lymphs Abs of 79.5K, Monocytes Abs of 7.9K, Glucose of 140. 03/22/2021 LDH . Lab Results  Component Value Date   LDH 184 03/22/2021   On review of systems, pt reports lower extremity weakness/fatigue, hairline fracture of wrist, intermittent cramping, chronic drenching  hot night sweats, changes in urination and denies back pain, back injury, chest pain, acute SOB, leg swelling, new lumps/bumps, abdominal distention, abdominal pain, changes in bowel habits, decreased appetite, sudden weight loss, fevers, chills, cool sweats, and any other symptoms.   MEDICAL HISTORY:  Past Medical History:  Diagnosis Date   Aortic atherosclerosis (HCC)    CLL (chronic lymphocytic leukemia) (HCC)    Constipation    severe   Diabetes mellitus without complication (HCC)    Diverticulosis    HLD  (hyperlipidemia)    Hypertension    Nephrolithiasis     SURGICAL HISTORY: Past Surgical History:  Procedure Laterality Date   FINGER FRACTURE SURGERY Right    middle finger   SHOULDER ARTHROSCOPY Bilateral    VASECTOMY      SOCIAL HISTORY: Social History   Socioeconomic History   Marital status: Married    Spouse name: Markesan   Number of children: 3   Years of education: 14   Highest education level: Some college, no degree  Occupational History   Occupation: retired  Tobacco Use   Smoking status: Former    Types: Cigarettes    Quit date: 07/31/1977    Years since quitting: 43.6   Smokeless tobacco: Never   Tobacco comments:    quit 30 yrs ago  Vaping Use   Vaping Use: Never used  Substance and Sexual Activity   Alcohol use: No   Drug use: No   Sexual activity: Never    Birth control/protection: Abstinence  Other Topics Concern   Not on file  Social History Narrative   Drinks 1-2 cups of coffee. Enjoys reading, watching tv and likes to ride his stationary bike.    Social Determinants of Health   Financial Resource Strain: Low Risk    Difficulty of Paying Living Expenses: Not hard at all  Food Insecurity: No Food Insecurity   Worried About Charity fundraiser in the Last Year: Never true   [place] in the Last Year: Never true  Transportation Needs: No Transportation Needs   Lack of Transportation (Medical): No   Lack of Transportation (Non-Medical): No  Physical Activity: Sufficiently Active   Days of Exercise per Week: 7 days   Minutes of Exercise per Session: 80 min  Stress: No Stress Concern Present   Feeling of Stress : Not at all  Social Connections: Moderately Isolated   Frequency of Communication with Friends and Family: Once a week   Frequency of Social Gatherings with Friends and Family: Once a week   Attends Religious Services: More than 4 times per year   Active Member of [employer] or Organizations: No   Attends Arts administrator: Never   Marital Status: Married  Human resources officer Violence: Not At Risk   Fear of Current or Ex-Partner: No   Emotionally Abused: No   Physically Abused: No   Sexually Abused: No    FAMILY HISTORY: Family History  Problem Relation Age of Onset   Heart disease Mother    Stroke Mother    Cancer Father    Stroke Maternal Grandfather    Colon cancer Neg Hx    Pancreatic cancer Neg Hx    Rectal cancer Neg Hx    Stomach cancer Neg Hx     ALLERGIES:  is allergic to shellfish allergy.  MEDICATIONS:  Current Outpatient Medications  Medication Sig Dispense Refill   ACCU-CHEK AVIVA PLUS test strip USE DAILY OR UP TO 4 TIMES PER  DAY AS DIRECTED). USE AS INSTRUCTED 200 strip PRN   ACCU-CHEK SOFTCLIX LANCETS lancets Use as instructed 100 each 12   AMBULATORY NON FORMULARY MEDICATION Accu-chek aviva plus test strips and accu-chek softclix lancets, use to check blood sugar up to four times a day. Dx Type 2 diabetes. 100 Units 3   atorvastatin (LIPITOR) 10 MG tablet TAKE 1 TABLET (10 MG TOTAL) BY MOUTH DAILY. DUE FOR LAB WORK 90 tablet 1   enalapril (VASOTEC) 2.5 MG tablet TAKE 1 TABLET BY MOUTH EVERY DAY 90 tablet 1   metFORMIN (GLUCOPHAGE) 500 MG tablet TAKE 2 TABLETS BY MOUTH EVERY DAY WITH BREAKFAST 180 tablet 3   Multiple Vitamin (MULTIVITAMIN WITH MINERALS) TABS tablet Take 1 tablet daily by mouth.     Omega-3 Fatty Acids (FISH OIL PO) Take by mouth.     polyethylene glycol (MIRALAX) packet Take 17 g by mouth daily. (Patient taking differently: Take 17 g by mouth as needed.) 28 each 0   sildenafil (VIAGRA) 100 MG tablet Take 0.5-1 tablets (50-100 mg total) by mouth daily as needed for erectile dysfunction. 30 tablet 11   No current facility-administered medications for this visit.    REVIEW OF SYSTEMS:   10 Point review of Systems was done is negative except as noted above.  PHYSICAL EXAMINATION: ECOG PERFORMANCE STATUS: 0 - Asymptomatic  . Vitals:   03/22/21 1004   BP: 132/68  Pulse: 89  Resp: 17  Temp: 98.7 F (37.1 C)  SpO2: 99%    Filed Weights   03/22/21 1004  Weight: 156 lb 4.8 oz (70.9 kg)   .Body mass index is 23.77 kg/m.    GENERAL:alert, in no acute distress and comfortable SKIN: no acute rashes, no significant lesions. Few spots on ear / head. EYES: conjunctiva are pink and non-injected, sclera anicteric OROPHARYNX: MMM, no exudates, no oropharyngeal erythema or ulceration NECK: supple, no JVD LYMPH:  no palpable lymphadenopathy in the axillary or inguinal regions. Few small b/l lymph nodes in cervical region. LUNGS: clear to auscultation b/l with normal respiratory effort HEART: regular rate & rhythm ABDOMEN:  normoactive bowel sounds , non tender, not distended. Extremity: no pedal edema PSYCH: alert & oriented x 3 with fluent speech NEURO: no focal motor/sensory deficits  LABORATORY DATA:  I have reviewed the data as listed  . CBC Latest Ref Rng & Units 03/22/2021 10/02/2020 06/04/2020  WBC 4.0 - 10.5 K/uL 96.4(HH) 66.9(HH) 52.9(HH)  Hemoglobin 13.0 - 17.0 g/dL 11.7(L) 12.6(L) 11.6(L)  Hematocrit 39.0 - 52.0 % 35.4(L) 38.8(L) 36.0(L)  Platelets 150 - 400 K/uL 137(L) 160 138(L)   . CBC    Component Value Date/Time   WBC 96.4 (HH) 03/22/2021 0950   RBC 3.75 (L) 03/22/2021 0950   HGB 11.7 (L) 03/22/2021 0950   HGB 13.1 03/22/2018 0944   HGB 12.8 (L) 05/29/2017 1141   HCT 35.4 (L) 03/22/2021 0950   HCT 38.7 05/29/2017 1141   PLT 137 (L) 03/22/2021 0950   PLT 146 03/22/2018 0944   PLT 151 05/29/2017 1141   MCV 94.4 03/22/2021 0950   MCV 91.3 05/29/2017 1141   MCH 31.2 03/22/2021 0950   MCHC 33.1 03/22/2021 0950   RDW 14.1 03/22/2021 0950   RDW 13.8 05/29/2017 1141   LYMPHSABS 79.5 (H) 03/22/2021 0950   LYMPHSABS 8.8 (H) 05/29/2017 1141   MONOABS 7.9 (H) 03/22/2021 0950   MONOABS 0.7 05/29/2017 1141   EOSABS 0.4 03/22/2021 0950   EOSABS 0.1 05/29/2017 1141   BASOSABS 0.1  03/22/2021 0950   BASOSABS 0.0  05/29/2017 1141    . CMP Latest Ref Rng & Units 03/22/2021 10/02/2020 06/04/2020  Glucose 70 - 99 mg/dL 140(H) 156(H) 129(H)  BUN 8 - 23 mg/dL $Remove'17 23 21  'ezbScOz$ Creatinine 0.61 - 1.24 mg/dL 1.22 1.17 1.01  Sodium 135 - 145 mmol/L 141 140 140  Potassium 3.5 - 5.1 mmol/L 4.3 4.5 4.2  Chloride 98 - 111 mmol/L 109 107 108  CO2 22 - 32 mmol/L $RemoveB'22 24 24  'KFYbuIAG$ Calcium 8.9 - 10.3 mg/dL 9.4 9.4 9.2  Total Protein 6.5 - 8.1 g/dL 7.6 7.5 7.2  Total Bilirubin 0.3 - 1.2 mg/dL 0.9 0.7 0.8  Alkaline Phos 38 - 126 U/L 91 92 88  AST 15 - 41 U/L $Remo'21 21 21  'QctaJ$ ALT 0 - 44 U/L $Remo'21 20 20   'tYnAH$ 06/08/17 FISH CLL Prognostic Panel:    06/08/17 Cytogenetics:   05/29/17 Peripheral blood flow cytometry:    RADIOGRAPHIC STUDIES: I have personally reviewed the radiological images as listed and agreed with the findings in the report. No results found.  ASSESSMENT & PLAN:   80 y.o. male with  1. Chronic Lymphocytic Leukemia Rai 1  06/08/17 CLL FISH Prognostic panel negative for del17p, del11q, del13q, indicating intermediate risk 11/15/2019 CT C/A/P ([id number]) revealed gradual lymph node increase compared to 2018 CT. No major lymphoma progression. Mildly enlarged prostate.    PLAN: -Discussed pt labwork today, 03/22/2021; WBC gradually increasing, Plt and Hgb stable, chemistries normal.  -Advised pt that his current symptoms sounds more like spinal stenosis than generalized fatigue. Recommended pt follow up with his PCP for further evaluation. -Advised pt that his CLL counts are increasing, but not to the point it is affecting his other blood counts or causing the symptoms he has. -Advised pt that there is concern for a back issue causing his pain and symptoms. Would recommend pt get MRI Lumbar/Sacral spine to rule out spinal stenosis. This could be lumbar spinal stenosis or an arterial issue. Would also recommend US Arterial with ABI to rule out arterial insuffiencey. Will have PCP order this. -No lab or clinical evidence of  significant CLL progression at this time. No indication to initiate treatment immediately.  -Recommended pt call PCP ASAP regarding his new urinary and leg symptoms. They can put him on medicine to relax prostate if it is enlarged. -Will see back in 4 months with labs.   FOLLOW UP: RTC with Dr Irene Limbo with labs in 4 months F/u with PCP to evaluate lower extremity and prostate symptoms     The total time spent in the appointment was 30 minutes and more than 50% was on counseling and direct patient cares.  All of the patient's questions were answered with apparent satisfaction. The patient knows to call the clinic with any problems, questions or concerns.    Sullivan Lone MD [place] AAHIVMS [facility] Hematology/Oncology Physician [facility]  (Office):       [phone] (Work cell):  [phone] (Fax):           [phone]  03/22/2021 10:49 AM  I, Reinaldo Raddle, am acting as scribe for Dr. Sullivan Lone, MD.  .I have reviewed the above documentation for accuracy and completeness, and I agree with the above. [name] MD

## 2021-03-22 ENCOUNTER — Inpatient Hospital Stay: Payer: Medicare HMO | Attending: Hematology

## 2021-03-22 ENCOUNTER — Other Ambulatory Visit: Payer: Self-pay

## 2021-03-22 ENCOUNTER — Inpatient Hospital Stay (HOSPITAL_BASED_OUTPATIENT_CLINIC_OR_DEPARTMENT_OTHER): Payer: Medicare HMO | Admitting: Hematology

## 2021-03-22 VITALS — BP 132/68 | HR 89 | Temp 98.7°F | Resp 17 | Ht 68.0 in | Wt 156.3 lb

## 2021-03-22 DIAGNOSIS — Z79899 Other long term (current) drug therapy: Secondary | ICD-10-CM | POA: Insufficient documentation

## 2021-03-22 DIAGNOSIS — I700 Atherosclerosis of aorta: Secondary | ICD-10-CM | POA: Diagnosis not present

## 2021-03-22 DIAGNOSIS — Z87442 Personal history of urinary calculi: Secondary | ICD-10-CM | POA: Insufficient documentation

## 2021-03-22 DIAGNOSIS — C9110 Chronic lymphocytic leukemia of B-cell type not having achieved remission: Secondary | ICD-10-CM

## 2021-03-22 DIAGNOSIS — R531 Weakness: Secondary | ICD-10-CM | POA: Diagnosis not present

## 2021-03-22 DIAGNOSIS — Z87891 Personal history of nicotine dependence: Secondary | ICD-10-CM | POA: Insufficient documentation

## 2021-03-22 DIAGNOSIS — E119 Type 2 diabetes mellitus without complications: Secondary | ICD-10-CM | POA: Diagnosis not present

## 2021-03-22 DIAGNOSIS — I10 Essential (primary) hypertension: Secondary | ICD-10-CM | POA: Diagnosis not present

## 2021-03-22 DIAGNOSIS — K5900 Constipation, unspecified: Secondary | ICD-10-CM | POA: Insufficient documentation

## 2021-03-22 DIAGNOSIS — Z800 Family history of malignant neoplasm of digestive organs: Secondary | ICD-10-CM | POA: Diagnosis not present

## 2021-03-22 DIAGNOSIS — Z801 Family history of malignant neoplasm of trachea, bronchus and lung: Secondary | ICD-10-CM | POA: Insufficient documentation

## 2021-03-22 DIAGNOSIS — R5383 Other fatigue: Secondary | ICD-10-CM | POA: Diagnosis not present

## 2021-03-22 DIAGNOSIS — E785 Hyperlipidemia, unspecified: Secondary | ICD-10-CM | POA: Insufficient documentation

## 2021-03-22 LAB — CMP (CANCER CENTER ONLY)
ALT: 21 U/L (ref 0–44)
AST: 21 U/L (ref 15–41)
Albumin: 4.3 g/dL (ref 3.5–5.0)
Alkaline Phosphatase: 91 U/L (ref 38–126)
Anion gap: 10 (ref 5–15)
BUN: 17 mg/dL (ref 8–23)
CO2: 22 mmol/L (ref 22–32)
Calcium: 9.4 mg/dL (ref 8.9–10.3)
Chloride: 109 mmol/L (ref 98–111)
Creatinine: 1.22 mg/dL (ref 0.61–1.24)
GFR, Estimated: >60 mL/min (ref >60)
Glucose, Bld: 140 mg/dL — ABNORMAL HIGH (ref 70–99)
Potassium: 4.3 mmol/L (ref 3.5–5.1)
Sodium: 141 mmol/L (ref 135–145)
Total Bilirubin: 0.9 mg/dL (ref 0.3–1.2)
Total Protein: 7.6 g/dL (ref 6.5–8.1)

## 2021-03-22 LAB — CBC WITH DIFFERENTIAL/PLATELET
Abs Immature Granulocytes: 0.73 10*3/uL — ABNORMAL HIGH (ref 0.00–0.07)
Basophils Absolute: 0.1 10*3/uL (ref 0.0–0.1)
Basophils Relative: 0 %
Eosinophils Absolute: 0.4 10*3/uL (ref 0.0–0.5)
Eosinophils Relative: 0 %
HCT: 35.4 % — ABNORMAL LOW (ref 39.0–52.0)
Hemoglobin: 11.7 g/dL — ABNORMAL LOW (ref 13.0–17.0)
Immature Granulocytes: 1 %
Lymphocytes Relative: 83 %
Lymphs Abs: 79.5 10*3/uL — ABNORMAL HIGH (ref 0.7–4.0)
MCH: 31.2 pg (ref 26.0–34.0)
MCHC: 33.1 g/dL (ref 30.0–36.0)
MCV: 94.4 fL (ref 80.0–100.0)
Monocytes Absolute: 7.9 10*3/uL — ABNORMAL HIGH (ref 0.1–1.0)
Monocytes Relative: 8 %
Neutro Abs: 7.9 10*3/uL — ABNORMAL HIGH (ref 1.7–7.7)
Neutrophils Relative %: 8 %
Platelets: 137 10*3/uL — ABNORMAL LOW (ref 150–400)
RBC: 3.75 MIL/uL — ABNORMAL LOW (ref 4.22–5.81)
RDW: 14.1 % (ref 11.5–15.5)
WBC: 96.4 10*3/uL (ref 4.0–10.5)
nRBC: 0 % (ref 0.0–0.2)

## 2021-03-22 LAB — LACTATE DEHYDROGENASE: LDH: 184 U/L (ref 98–192)

## 2021-04-01 ENCOUNTER — Emergency Department (HOSPITAL_COMMUNITY)
Admission: EM | Admit: 2021-04-01 | Discharge: 2021-04-02 | Disposition: A | Discharge status: Home / Self Care | Payer: Medicare HMO | Attending: Emergency Medicine | Admitting: Emergency Medicine

## 2021-04-01 ENCOUNTER — Emergency Department (HOSPITAL_COMMUNITY): Payer: Medicare HMO

## 2021-04-01 DIAGNOSIS — C9110 Chronic lymphocytic leukemia of B-cell type not having achieved remission: Secondary | ICD-10-CM | POA: Diagnosis not present

## 2021-04-01 DIAGNOSIS — Z7984 Long term (current) use of oral hypoglycemic drugs: Secondary | ICD-10-CM | POA: Insufficient documentation

## 2021-04-01 DIAGNOSIS — R55 Syncope and collapse: Secondary | ICD-10-CM | POA: Diagnosis not present

## 2021-04-01 DIAGNOSIS — M79604 Pain in right leg: Secondary | ICD-10-CM | POA: Diagnosis not present

## 2021-04-01 DIAGNOSIS — J9811 Atelectasis: Secondary | ICD-10-CM | POA: Diagnosis not present

## 2021-04-01 DIAGNOSIS — E119 Type 2 diabetes mellitus without complications: Secondary | ICD-10-CM | POA: Diagnosis not present

## 2021-04-01 DIAGNOSIS — R531 Weakness: Secondary | ICD-10-CM | POA: Diagnosis not present

## 2021-04-01 DIAGNOSIS — R0602 Shortness of breath: Secondary | ICD-10-CM | POA: Diagnosis not present

## 2021-04-01 DIAGNOSIS — Z20822 Contact with and (suspected) exposure to covid-19: Secondary | ICD-10-CM | POA: Diagnosis not present

## 2021-04-01 DIAGNOSIS — Z87891 Personal history of nicotine dependence: Secondary | ICD-10-CM | POA: Insufficient documentation

## 2021-04-01 DIAGNOSIS — Z79899 Other long term (current) drug therapy: Secondary | ICD-10-CM | POA: Insufficient documentation

## 2021-04-01 DIAGNOSIS — M79605 Pain in left leg: Secondary | ICD-10-CM | POA: Insufficient documentation

## 2021-04-01 DIAGNOSIS — I2510 Atherosclerotic heart disease of native coronary artery without angina pectoris: Secondary | ICD-10-CM | POA: Diagnosis not present

## 2021-04-01 DIAGNOSIS — R5383 Other fatigue: Secondary | ICD-10-CM | POA: Diagnosis not present

## 2021-04-01 DIAGNOSIS — Q438 Other specified congenital malformations of intestine: Secondary | ICD-10-CM | POA: Diagnosis not present

## 2021-04-01 DIAGNOSIS — I10 Essential (primary) hypertension: Secondary | ICD-10-CM | POA: Diagnosis not present

## 2021-04-01 DIAGNOSIS — Z8739 Personal history of other diseases of the musculoskeletal system and connective tissue: Secondary | ICD-10-CM | POA: Diagnosis not present

## 2021-04-01 DIAGNOSIS — M79606 Pain in leg, unspecified: Secondary | ICD-10-CM

## 2021-04-01 DIAGNOSIS — R0600 Dyspnea, unspecified: Secondary | ICD-10-CM | POA: Diagnosis not present

## 2021-04-01 LAB — COMPREHENSIVE METABOLIC PANEL
ALT: 20 U/L (ref 0–44)
AST: 28 U/L (ref 15–41)
Albumin: 4.3 g/dL (ref 3.5–5.0)
Alkaline Phosphatase: 88 U/L (ref 38–126)
Anion gap: 8 (ref 5–15)
BUN: 18 mg/dL (ref 8–23)
CO2: 22 mmol/L (ref 22–32)
Calcium: 9.6 mg/dL (ref 8.9–10.3)
Chloride: 108 mmol/L (ref 98–111)
Creatinine, Ser: 1.15 mg/dL (ref 0.61–1.24)
GFR, Estimated: >60 mL/min (ref >60)
Glucose, Bld: 154 mg/dL — ABNORMAL HIGH (ref 70–99)
Potassium: 5.1 mmol/L (ref 3.5–5.1)
Sodium: 138 mmol/L (ref 135–145)
Total Bilirubin: 0.7 mg/dL (ref 0.3–1.2)
Total Protein: 7.3 g/dL (ref 6.5–8.1)

## 2021-04-01 LAB — CBC WITH DIFFERENTIAL/PLATELET
Abs Immature Granulocytes: 0 10*3/uL (ref 0.00–0.07)
Basophils Absolute: 0 10*3/uL (ref 0.0–0.1)
Basophils Relative: 0 %
Eosinophils Absolute: 0 10*3/uL (ref 0.0–0.5)
Eosinophils Relative: 0 %
HCT: 35.9 % — ABNORMAL LOW (ref 39.0–52.0)
Hemoglobin: 11.7 g/dL — ABNORMAL LOW (ref 13.0–17.0)
Lymphocytes Relative: 83 %
Lymphs Abs: 101.2 10*3/uL — ABNORMAL HIGH (ref 0.7–4.0)
MCH: 31.5 pg (ref 26.0–34.0)
MCHC: 32.6 g/dL (ref 30.0–36.0)
MCV: 96.5 fL (ref 80.0–100.0)
Monocytes Absolute: 0 10*3/uL — ABNORMAL LOW (ref 0.1–1.0)
Monocytes Relative: 0 %
Neutro Abs: 20.7 10*3/uL — ABNORMAL HIGH (ref 1.7–7.7)
Neutrophils Relative %: 17 %
Platelets: 161 10*3/uL (ref 150–400)
RBC: 3.72 MIL/uL — ABNORMAL LOW (ref 4.22–5.81)
RDW: 14.3 % (ref 11.5–15.5)
WBC: 121.9 10*3/uL (ref 4.0–10.5)
nRBC: 0 % (ref 0.0–0.2)

## 2021-04-01 LAB — TROPONIN I (HIGH SENSITIVITY)
Troponin I (High Sensitivity): 12 ng/L (ref <18)
Troponin I (High Sensitivity): 12 ng/L (ref <18)

## 2021-04-01 LAB — OSMOLALITY: Osmolality: 302 mOsm/kg — ABNORMAL HIGH (ref 275–295)

## 2021-04-01 IMAGING — DX DG CHEST 2V
2 series · 2 of 2 positions shown · non-contrast
Comparison: None.

CLINICAL DATA: Dyspnea with exertion.

EXAM:
CHEST - 2 VIEW

[chest pa]
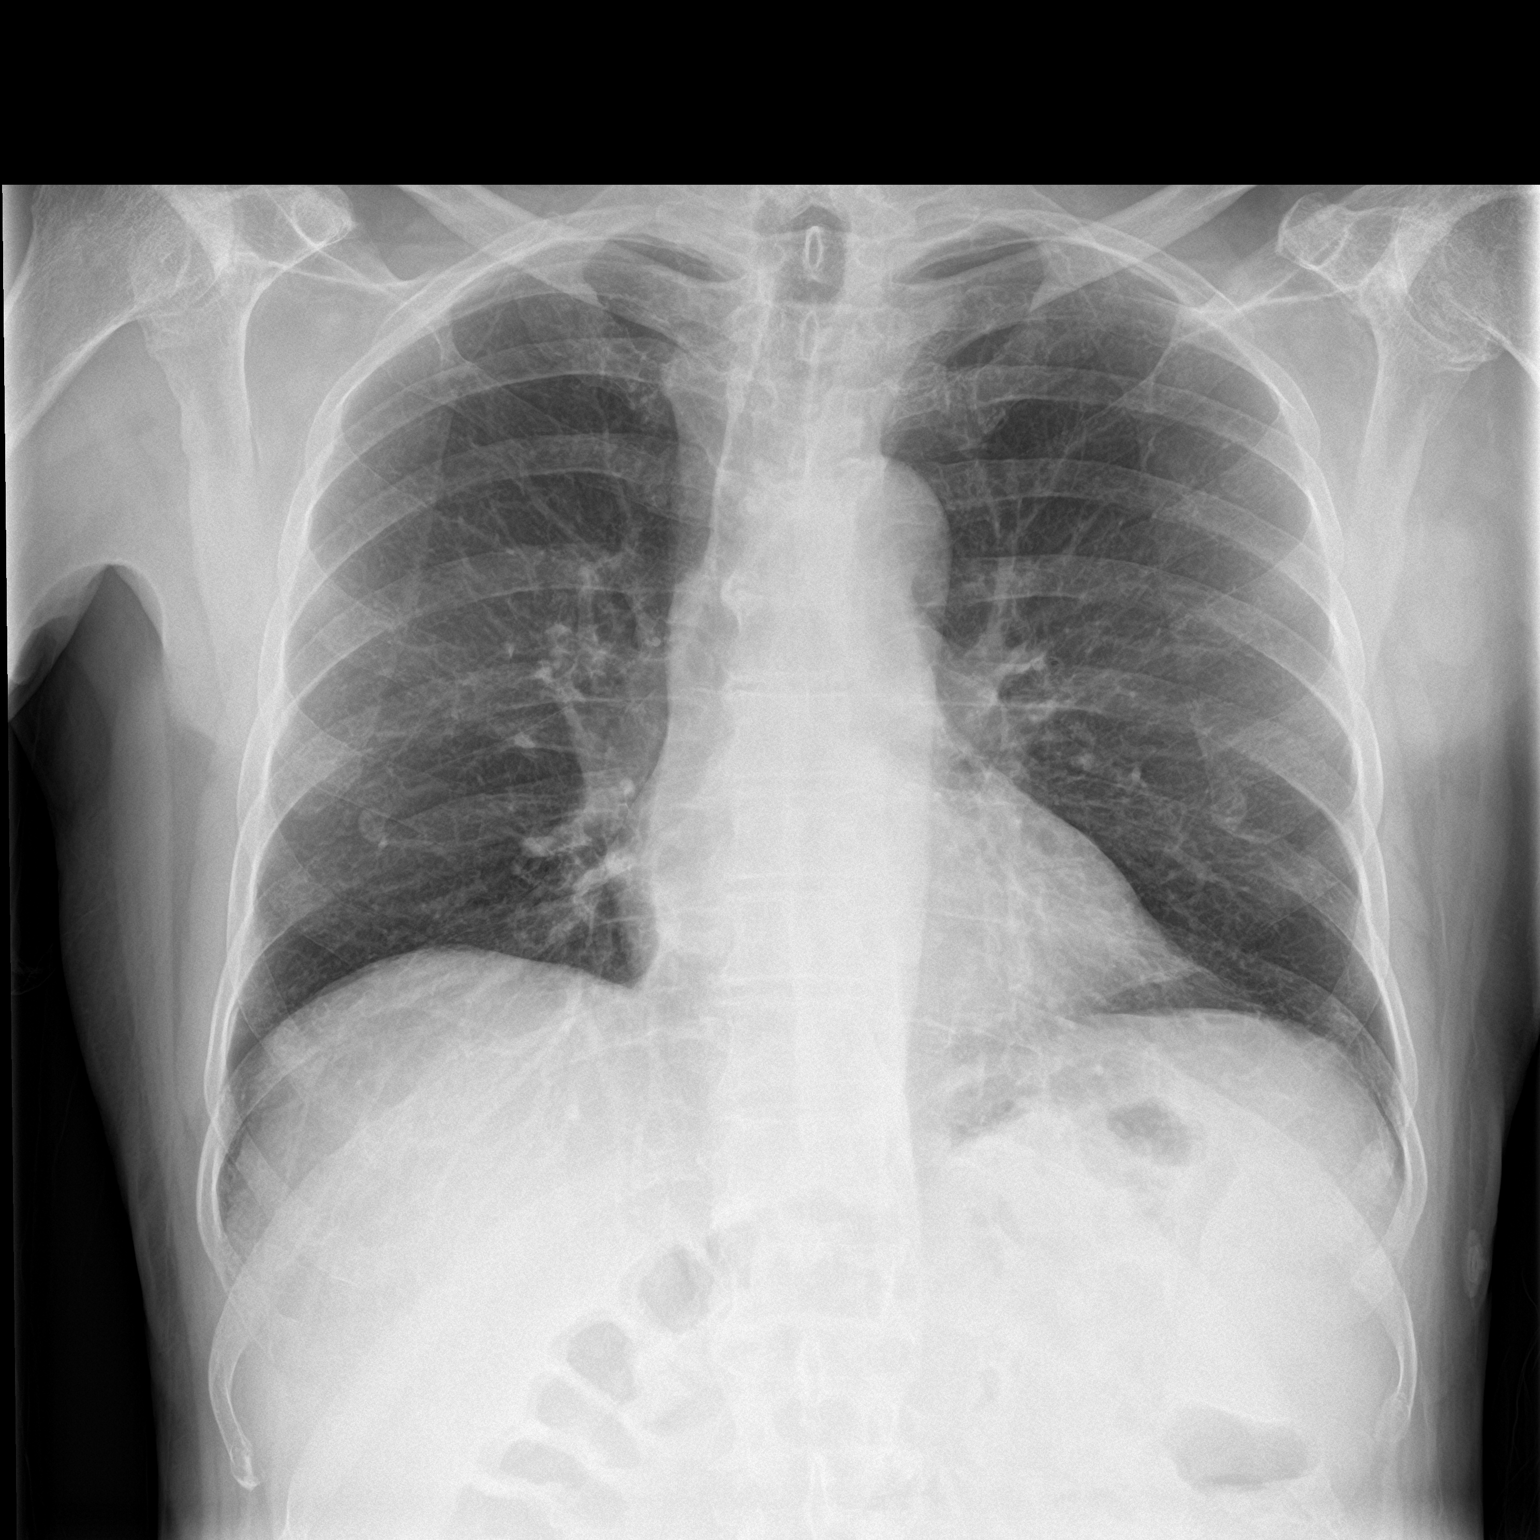

[chest lat]
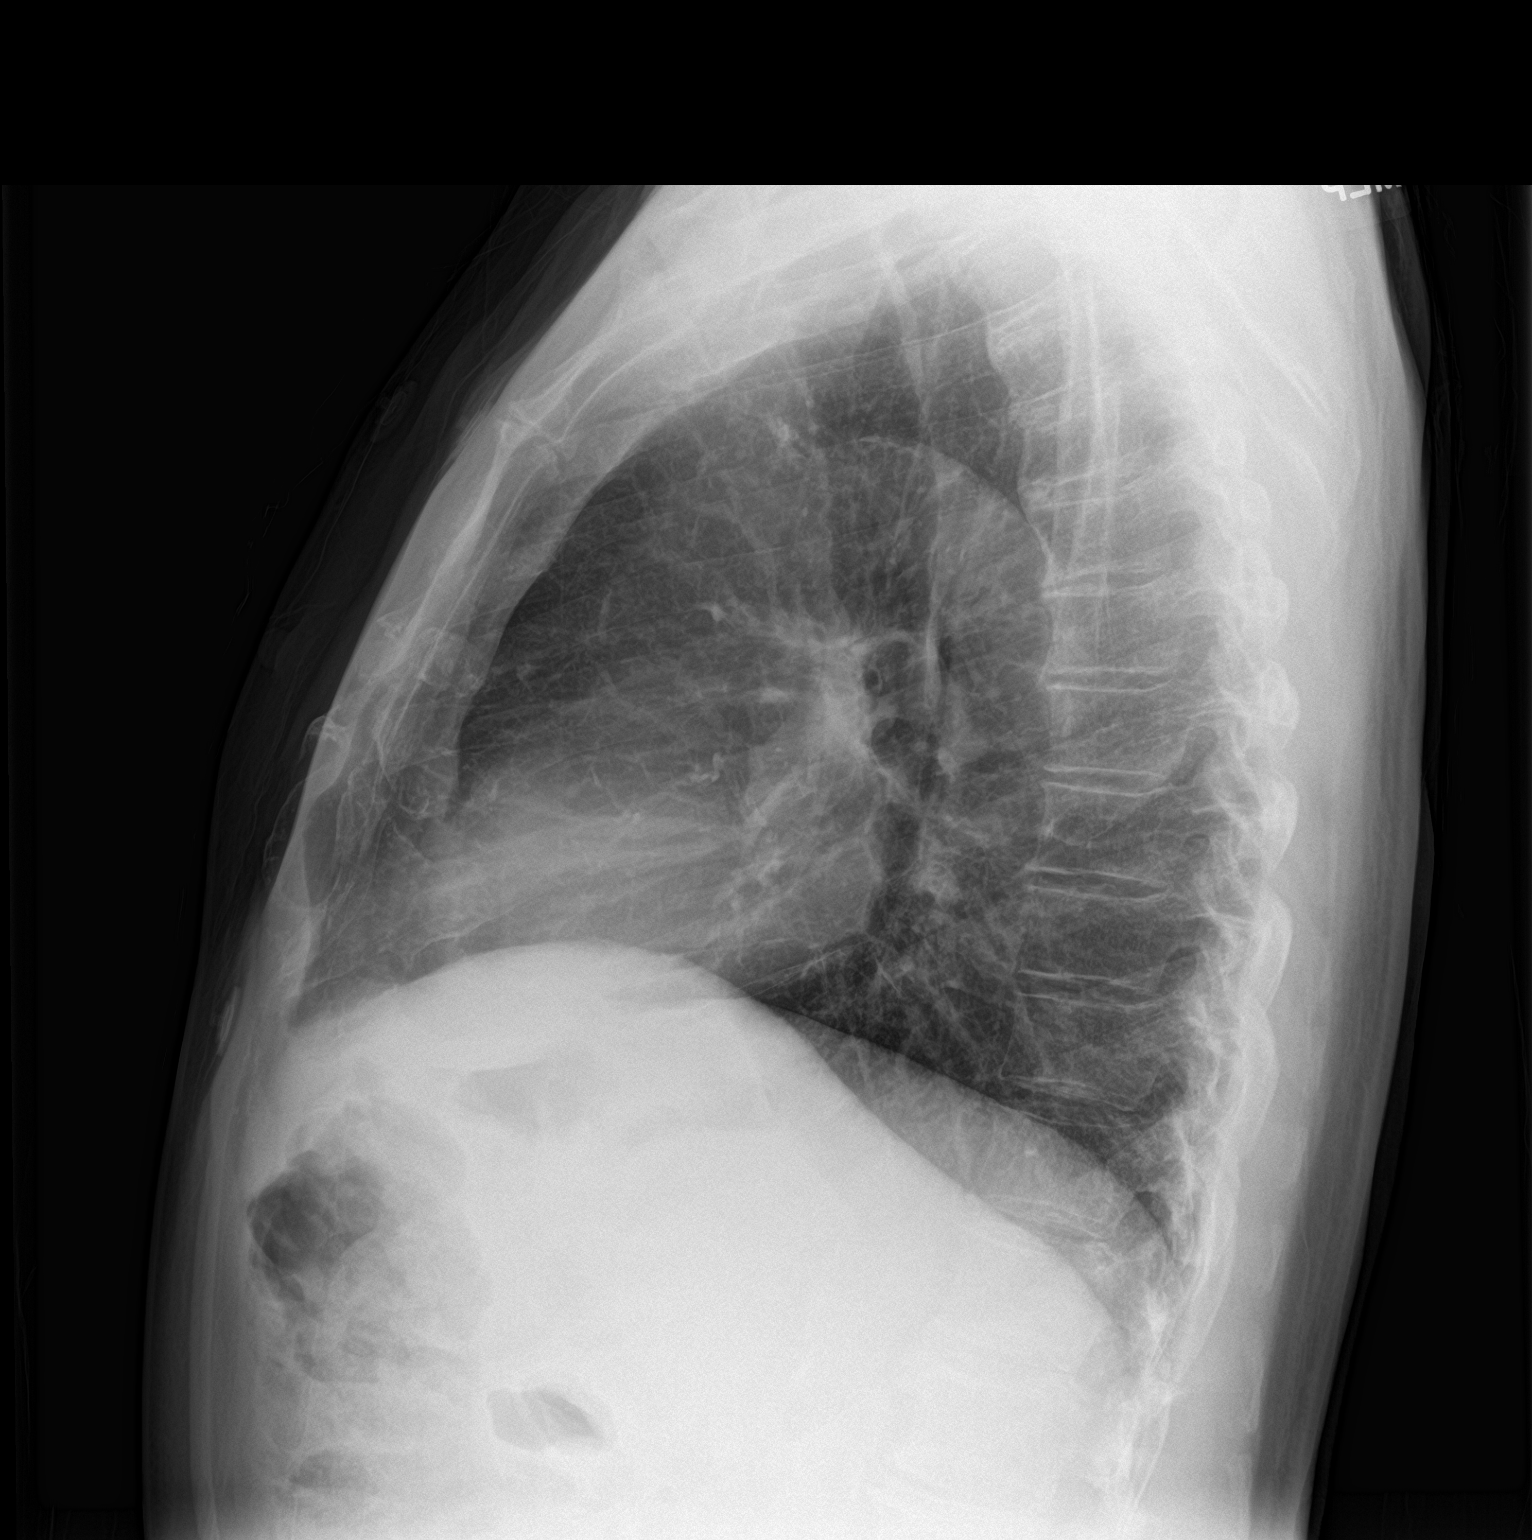

[2 of 2 positions shown; findings below may reference images not displayed]

FINDINGS: The heart size and mediastinal contours are within normal limits.
Both lungs are clear. The visualized skeletal structures are
unremarkable.
IMPRESSION: No active cardiopulmonary disease.

## 2021-04-01 NOTE — ED Provider Notes (Signed)
Emergency Medicine Provider Triage Evaluation Note  Tony Banks , a 80 y.o. male  was evaluated in triage.  Pt complains of feeling that he is going to pass out, happened earlier today, where she felt some tongue numbness, he had no numbness on his face, arms, legs, denies headaches, change in vision, no weakness in his lower extremities.  He denies recent head trauma he is not on anticoagulant.  He has no other complaints at this time..  Review of Systems  Positive: Numbness, passing out Negative: Headaches, change in vision  Physical Exam  BP (!) 169/73 (BP Location: Left Arm)   Pulse 98   Temp 98.3 F (36.8 C) (Oral)   Resp 18   SpO2 99%  Gen:   Awake, no distress   Resp:  Normal effort  MSK:   Moves extremities without difficulty  Other:  No facial asymmetry, no difficulty with word finding, able to follow commands, no unilateral weakness, no focal deficits present.  Medical Decision Making  Medically screening exam initiated at 7:15 PM.  Appropriate orders placed.  Tony Banks was informed that the remainder of the evaluation will be completed by another provider, this initial triage assessment does not replace that evaluation, and the importance of remaining in the ED until their evaluation is complete.  Presents with feeling as if he is going to pass out patient need further work-up in the ED   Marcello Fennel, PA-C 04/01/21 1917    Daleen Bo, MD 04/02/21 [phone]

## 2021-04-01 NOTE — ED Triage Notes (Addendum)
Pt reported to ED with c/o SOB with exertion and weakness in lower extremities x past few weeks.Denies any active SOB or CP at this time, denies CP that radiates into arm neck or jaw. Reports seeing oncologist for CLL and he suggest pt have sonogram performed of lower abdomen to investigate possible cause of weakness.

## 2021-04-02 ENCOUNTER — Emergency Department (HOSPITAL_COMMUNITY): Payer: Medicare HMO

## 2021-04-02 ENCOUNTER — Other Ambulatory Visit: Payer: Self-pay

## 2021-04-02 ENCOUNTER — Telehealth: Payer: Self-pay | Admitting: Hematology

## 2021-04-02 DIAGNOSIS — Q438 Other specified congenital malformations of intestine: Secondary | ICD-10-CM | POA: Diagnosis not present

## 2021-04-02 DIAGNOSIS — I2510 Atherosclerotic heart disease of native coronary artery without angina pectoris: Secondary | ICD-10-CM | POA: Diagnosis not present

## 2021-04-02 DIAGNOSIS — M79604 Pain in right leg: Secondary | ICD-10-CM | POA: Diagnosis not present

## 2021-04-02 DIAGNOSIS — Z8739 Personal history of other diseases of the musculoskeletal system and connective tissue: Secondary | ICD-10-CM | POA: Diagnosis not present

## 2021-04-02 DIAGNOSIS — R55 Syncope and collapse: Secondary | ICD-10-CM | POA: Diagnosis not present

## 2021-04-02 DIAGNOSIS — R0602 Shortness of breath: Secondary | ICD-10-CM | POA: Diagnosis not present

## 2021-04-02 DIAGNOSIS — J9811 Atelectasis: Secondary | ICD-10-CM | POA: Diagnosis not present

## 2021-04-02 DIAGNOSIS — R531 Weakness: Secondary | ICD-10-CM | POA: Diagnosis not present

## 2021-04-02 DIAGNOSIS — C9110 Chronic lymphocytic leukemia of B-cell type not having achieved remission: Secondary | ICD-10-CM | POA: Diagnosis not present

## 2021-04-02 DIAGNOSIS — R5383 Other fatigue: Secondary | ICD-10-CM | POA: Diagnosis not present

## 2021-04-02 LAB — MAGNESIUM: Magnesium: 2.5 mg/dL — ABNORMAL HIGH (ref 1.7–2.4)

## 2021-04-02 LAB — PATHOLOGIST SMEAR REVIEW

## 2021-04-02 LAB — RESP PANEL BY RT-PCR (FLU A&B, COVID) ARPGX2
Influenza A by PCR: NEGATIVE
Influenza B by PCR: NEGATIVE
SARS Coronavirus 2 by RT PCR: NEGATIVE

## 2021-04-02 IMAGING — MR MR LUMBAR SPINE WO/W CM
4 of 7 series · 23 of 48 positions shown · IV contrast (gadavist)
Comparison: CT Chest, Abdomen, and Pelvis and thoracic spine MRI
today are reported separately. CT Chest, Abdomen, and Pelvis
[DATE].

CLINICAL DATA: 80-year-old male with CLL. Increased fatigue, muscle
weakness, near syncope, shortness of breath, lower extremity
weakness. Query demyelinating disease.

EXAM:
MRI LUMBAR SPINE WITHOUT AND WITH CONTRAST
TECHNIQUE: Multiplanar and multiecho pulse sequences of the lumbar spine were
obtained without and with intravenous contrast.
CONTRAST:  7mL GADAVIST GADOBUTROL 1 MMOL/ML IV SOLN

[Series 1: T2 · sagittal · 4.0mm · 0.73mm/px · 4 of 16 slices shown (1 of 2)]
[im 1/16]
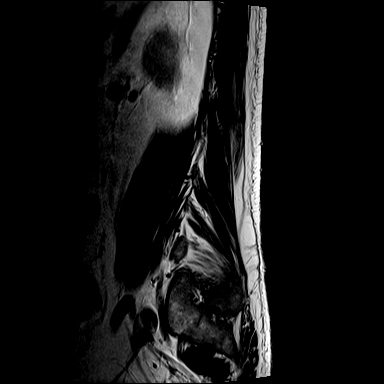
[im 6/16]
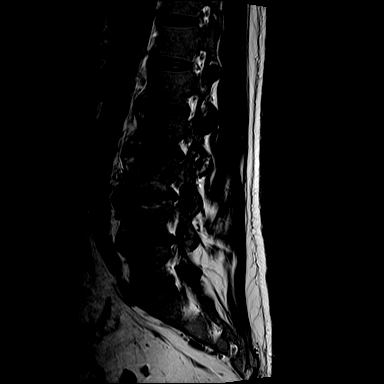
[im 11/16]
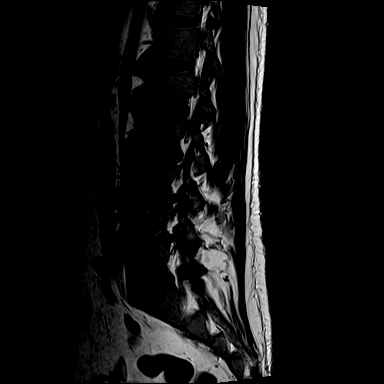
[im 16/16]
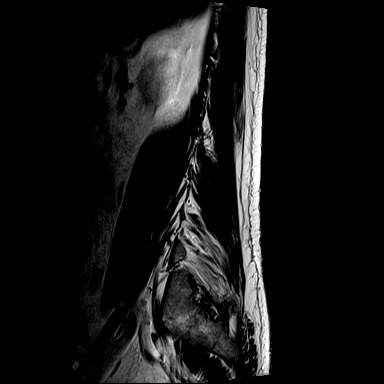

[Series 3: T1 · sagittal · 4.0mm · 0.88mm/px · 5 of 16 slices shown (1 of 2)]
[im 1/16]
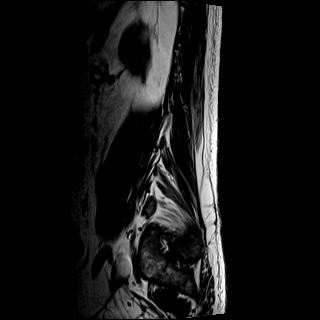
[im 4/16]
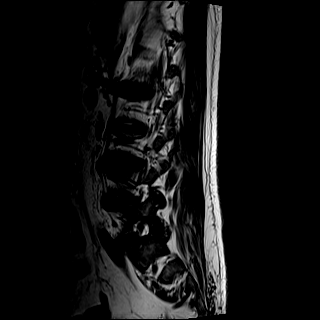
[im 8/16]
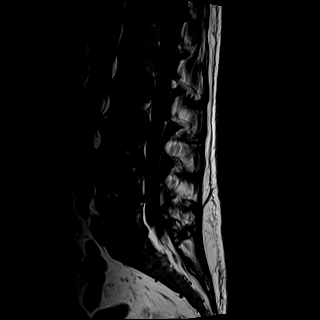
[im 12/16]
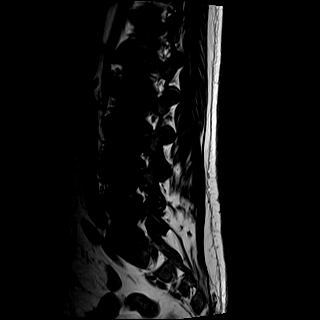
[im 16/16]
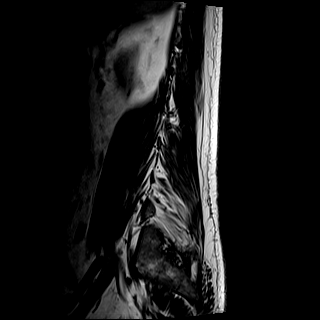

[Series 4: T2 · axial · 5.0mm · 0.57mm/px · z∈[-405,-152]mm · 8 of 34 slices shown (2 of 2)]
[im 1/34]
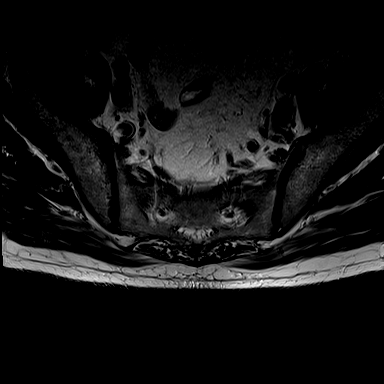
[im 4/34]
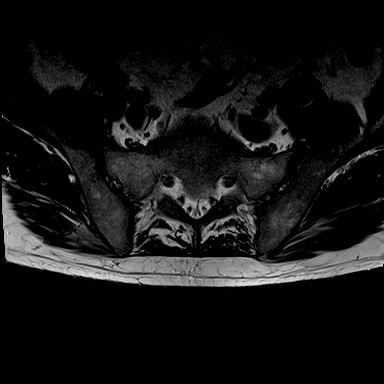
[im 12/34]
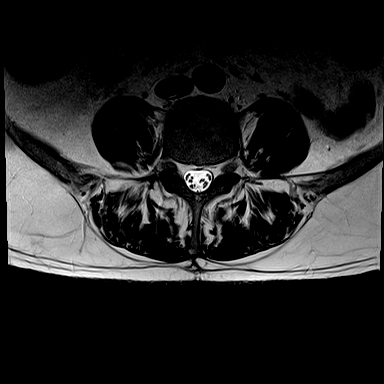
[im 15/34]
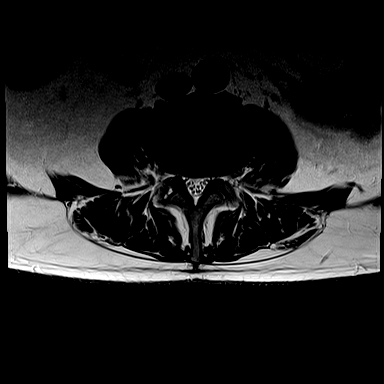
[im 19/34]
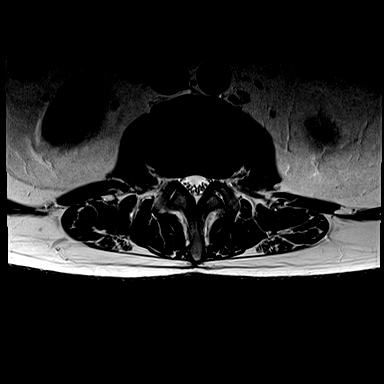
[im 23/34]
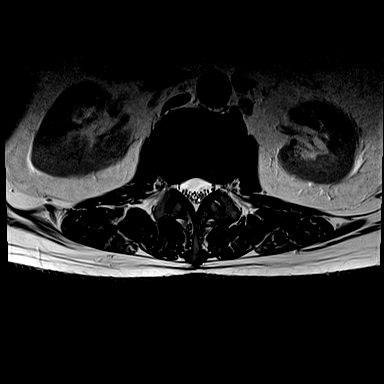
[im 30/34]
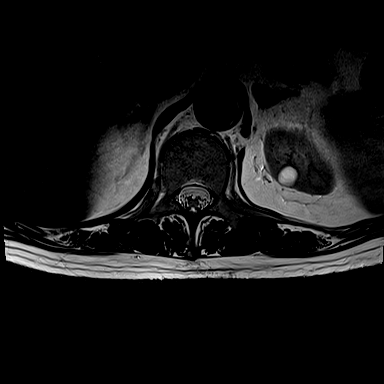
[im 34/34]
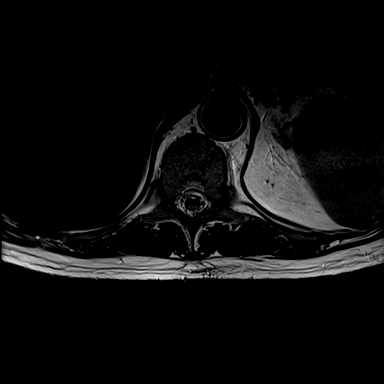

[Series 5: T1 · axial · 5.0mm · 0.34mm/px · z∈[-405,-182]mm · 6 of 34 slices shown (2 of 2)]
[im 1/34]
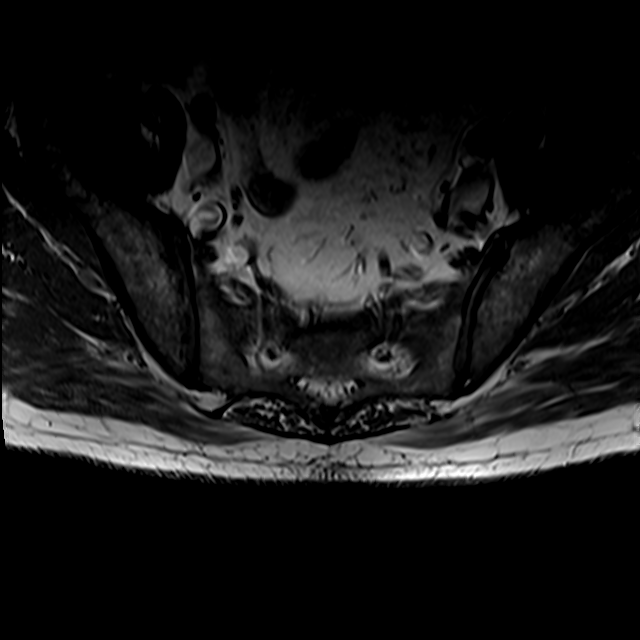
[im 4/34]
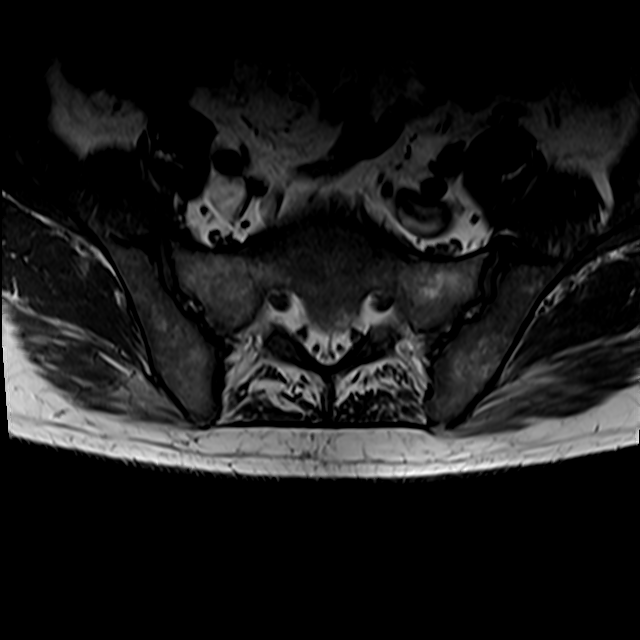
[im 12/34]
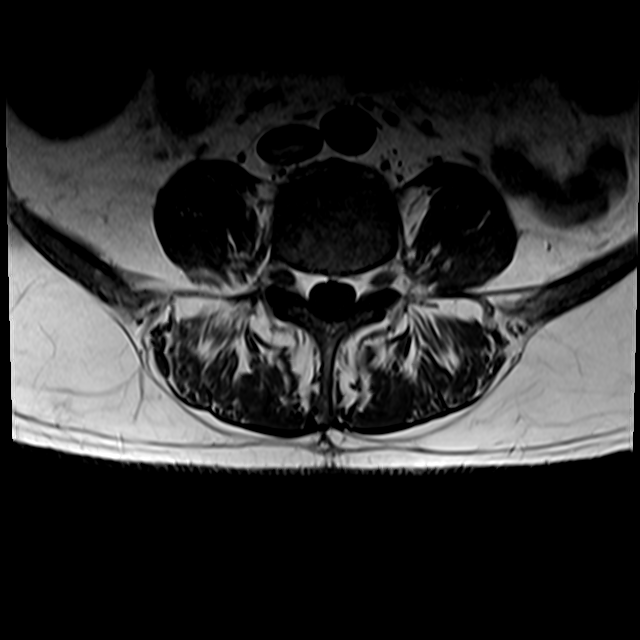
[im 15/34]
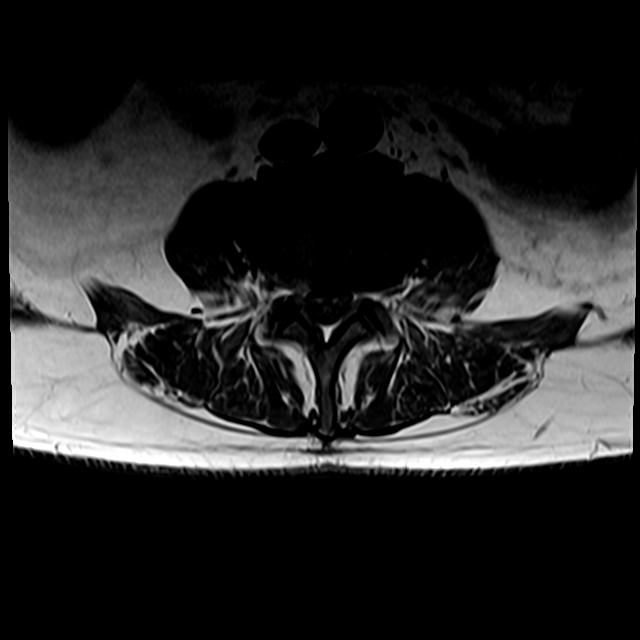
[im 19/34]
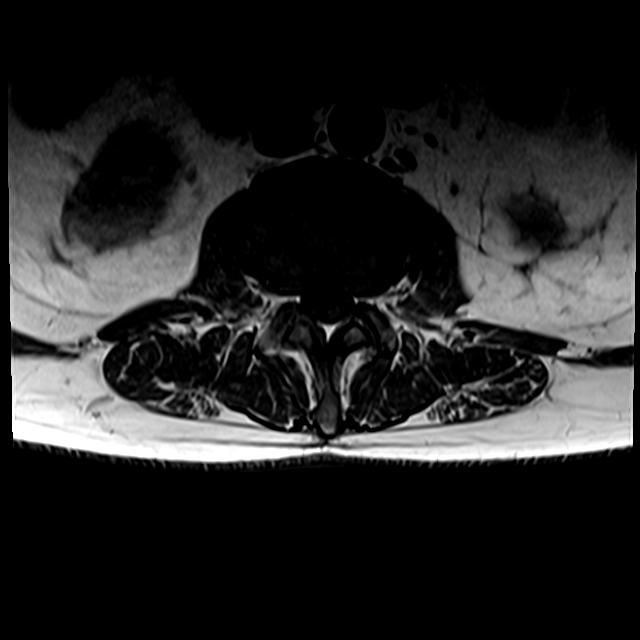
[im 30/34]
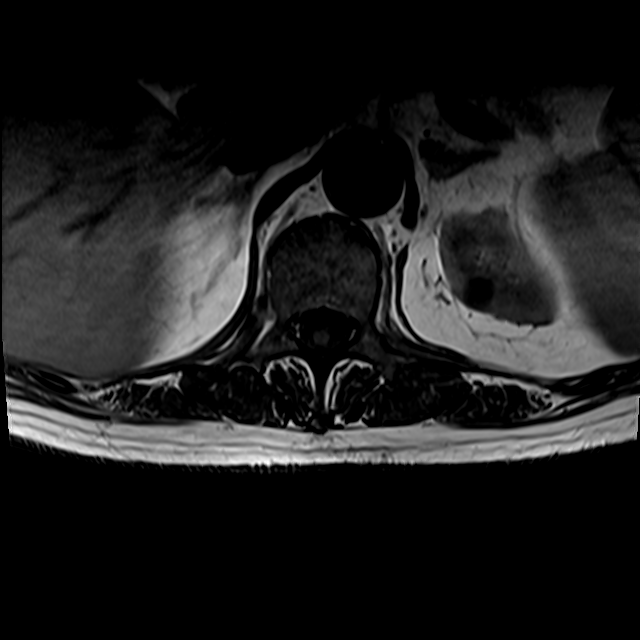

[23 of 48 positions shown; findings below may reference images not displayed]

FINDINGS: Segmentation: Normal, concordant with the thoracic spine numbering
today.

Alignment: Straightening of lumbar lordosis. Stable vertebral height
and alignment since [REDACTED].

Vertebrae: Diffusely decreased marrow signal in the visible spine
and pelvis, with some areas of endplate sparing related to fatty
chronic degenerative endplate marrow signal changes (series 3, image
8). No discrete destructive or enhancing marrow lesion identified.
However, there is questionable early extension of abnormal soft
tissue into the ventral sacral epidural space at the S1-S2 junction
(series 3, image 8). This is not definite, and elsewhere the lumbar
epidural space appears normal.

Conus medullaris and cauda equina: Conus extends to the T12 level.
No lower spinal cord or conus signal abnormality. Cauda equina nerve
roots appear normal. No nerve root thickening. No abnormal
intradural enhancement (presumed physiologic linear vascular
enhancement related to the continuation of the anterior spinal
artery on series 6, image 9). No dural thickening or enhancement.

Paraspinal and other soft tissues: Abdominal and pelvic viscera are
reported separately. Lumbar paraspinal soft tissues remain within
normal limits.

Disc levels:

Chronic lumbar disc and endplate degeneration L2-L3 through L4-L5.
Disc bulging and endplate spurring. Mild facet hypertrophy. There is
multifactorial mild spinal stenosis at L3-L4, with mild to moderate
bilateral L3 neural foraminal stenosis. No other significant
stenosis.
IMPRESSION: 1. Diffusely abnormal marrow signal throughout the visible spine and
pelvis, as seen on the Thoracic MRI.
This is indeterminate for infiltration by leukemia/lymphoma versus
widespread red marrow reactivation - and there is questionable
abnormal extension of soft tissue into the ventral sacral epidural
space at the S1-S2.
But no destructive bone lesion or compression fracture.

2. Normal conus medullaris and cauda equina nerve roots, except for
mild spinal stenosis and mild to moderate neural foraminal stenosis
due to degeneration at L3-L4.

3. See also CT Chest, Abdomen, and Pelvis today reported separately.

## 2021-04-02 IMAGING — CT CT CHEST-ABD-PELV W/ CM
2 of 5 series · 13 of 36 positions shown, 15 images · IV contrast (Omni 300)
Comparison: Restaging CT Chest, Abdomen, and Pelvis [DATE].
Thoracic and lumbar MRI today reported separately.

CLINICAL DATA: 80-year-old male with CLL. Increased fatigue, muscle
weakness, near syncope, shortness of breath, lower extremity
weakness. Query demyelinating disease.

EXAM:
CT CHEST, ABDOMEN, AND PELVIS WITH CONTRAST
TECHNIQUE: Multidetector CT imaging of the chest, abdomen and pelvis was
performed following the standard protocol during bolus
administration of intravenous contrast.
CONTRAST:  100mL OMNIPAQUE IOHEXOL 300 MG/ML  SOLN

[Series 3: cap with 5mm st · axial · 0.73mm/px · z∈[-458,+92]mm · 10 of 132 slices shown, 12 images]
[im 11/132  mediastinal]
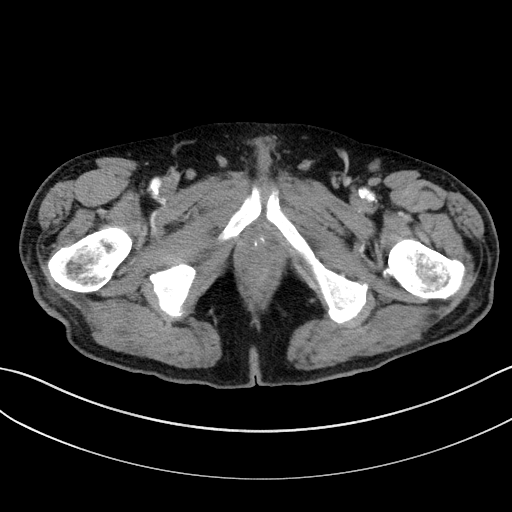
[im 11/132  bone]
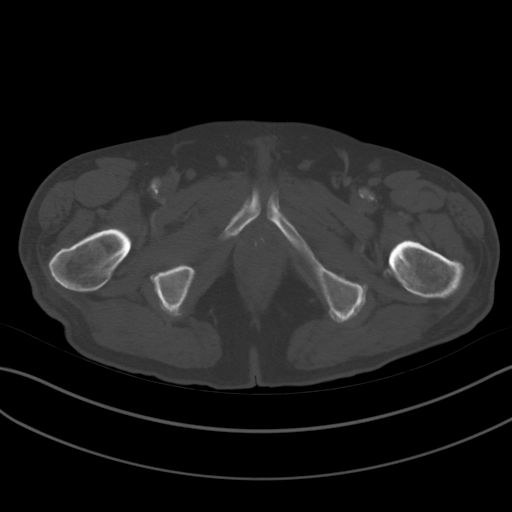
[im 22/132  mediastinal]
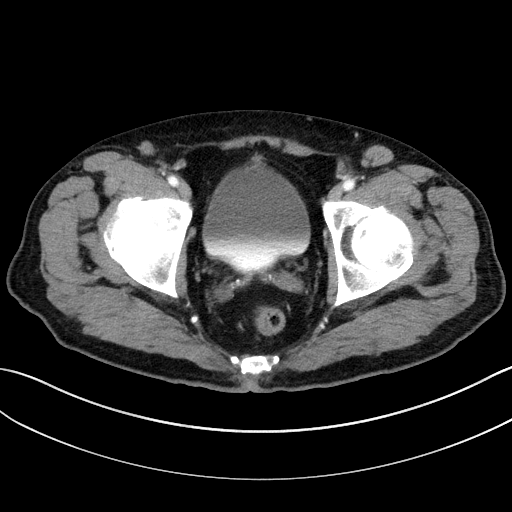
[im 33/132  mediastinal]
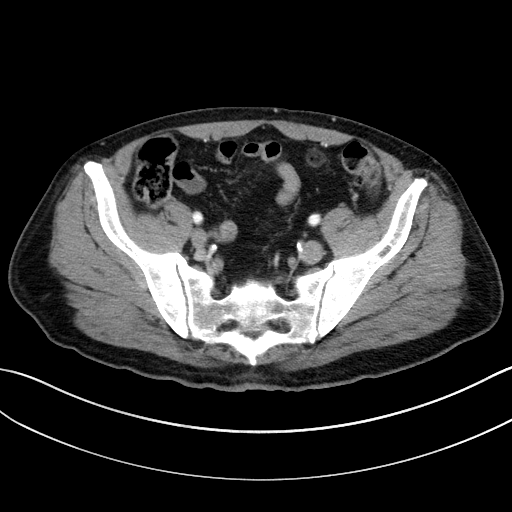
[im 44/132  mediastinal]
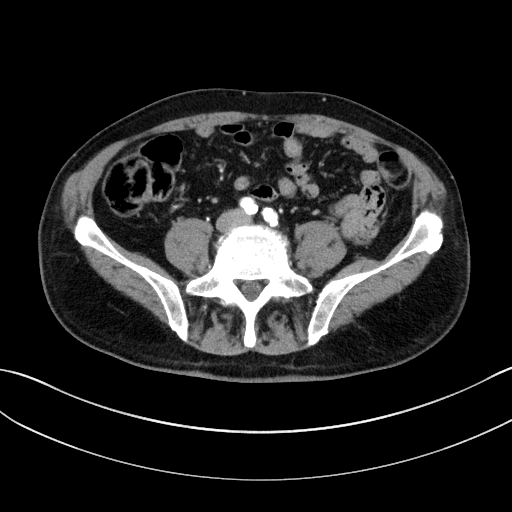
[im 55/132  mediastinal]
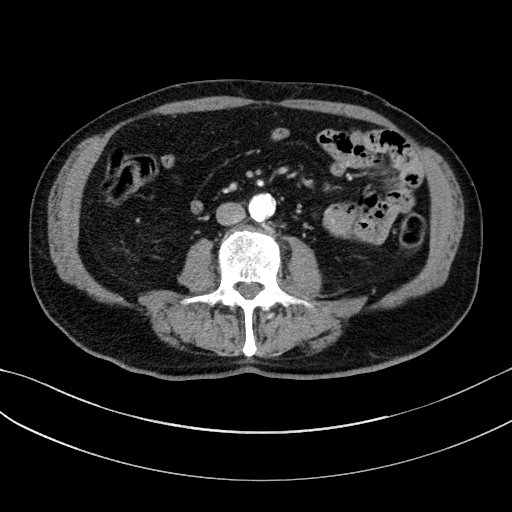
[im 77/132  mediastinal]
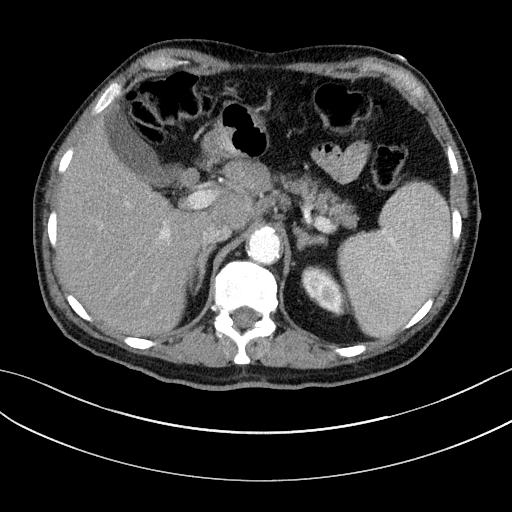
[im 88/132  mediastinal]
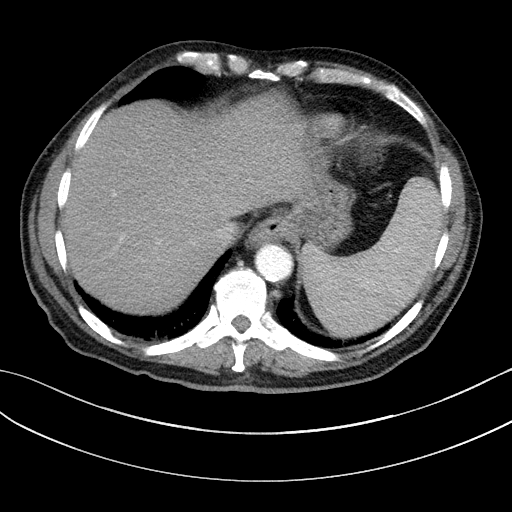
[im 99/132  mediastinal]
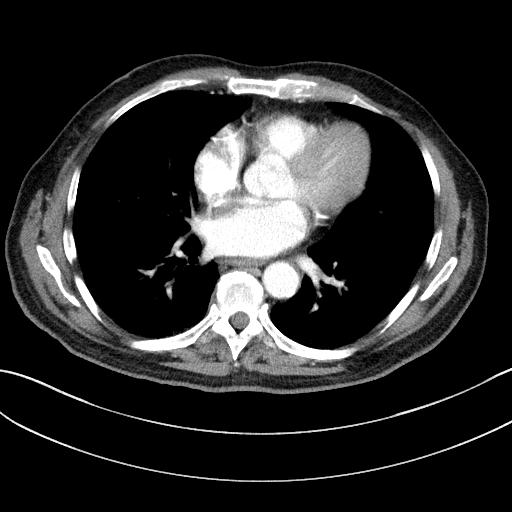
[im 110/132  mediastinal]
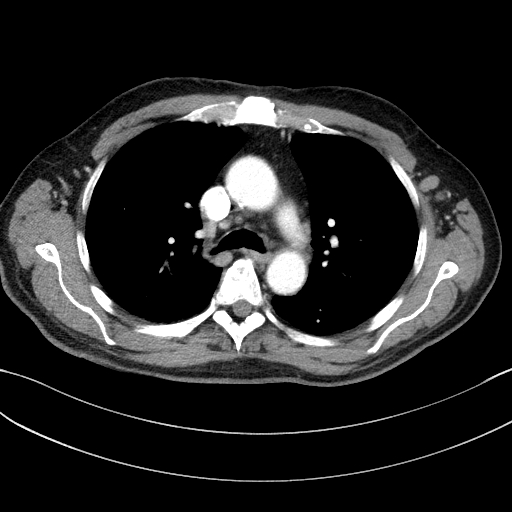
[im 110/132  bone]
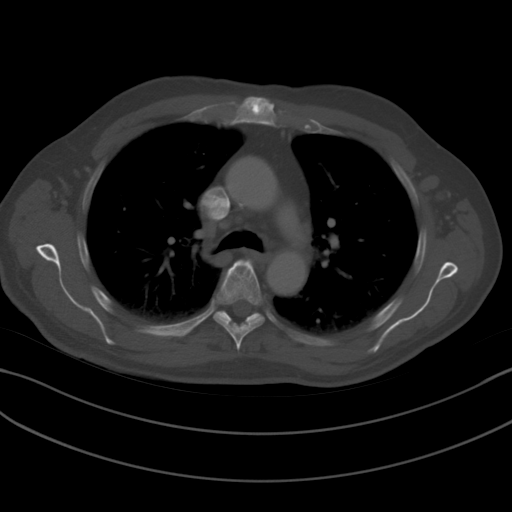
[im 121/132  mediastinal]
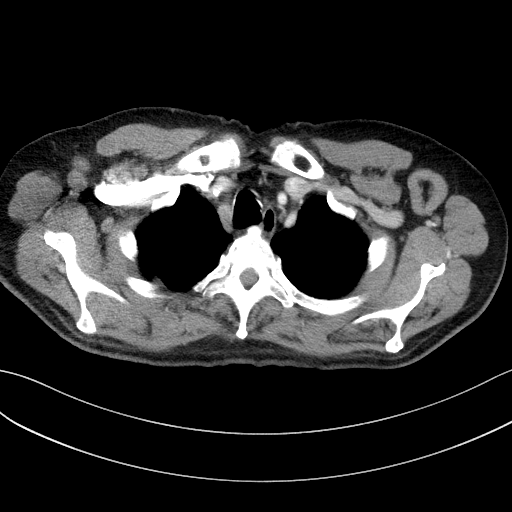

[Series 6: cap with 3mm st cor · coronal · 0.73mm/px · 3 of 166 slices shown]
[im 34/166  mediastinal]
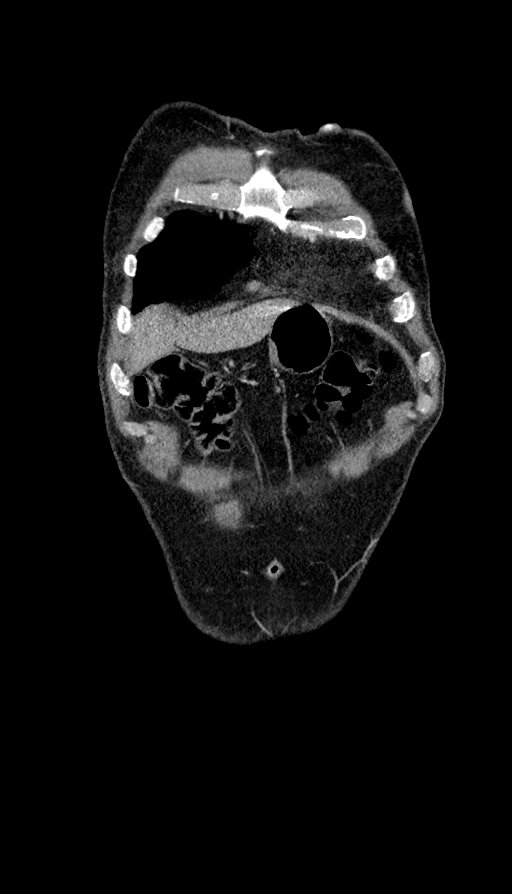
[im 67/166  mediastinal]
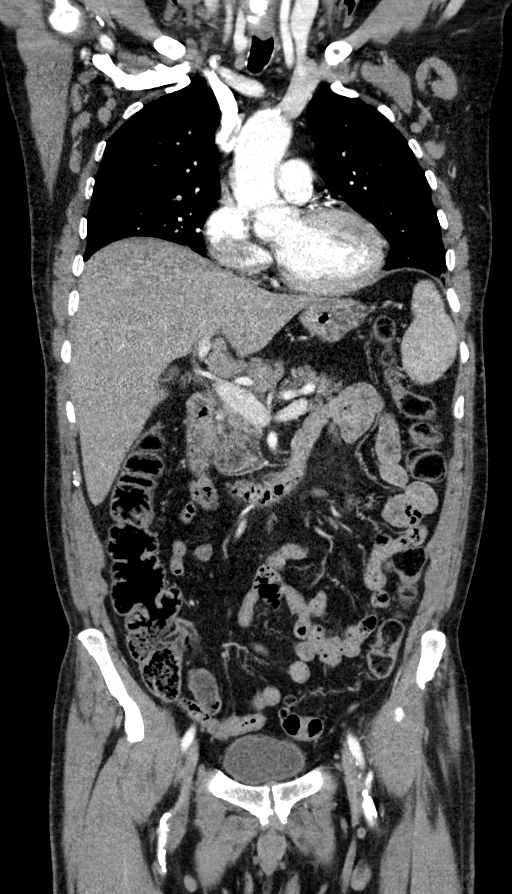
[im 100/166  mediastinal]
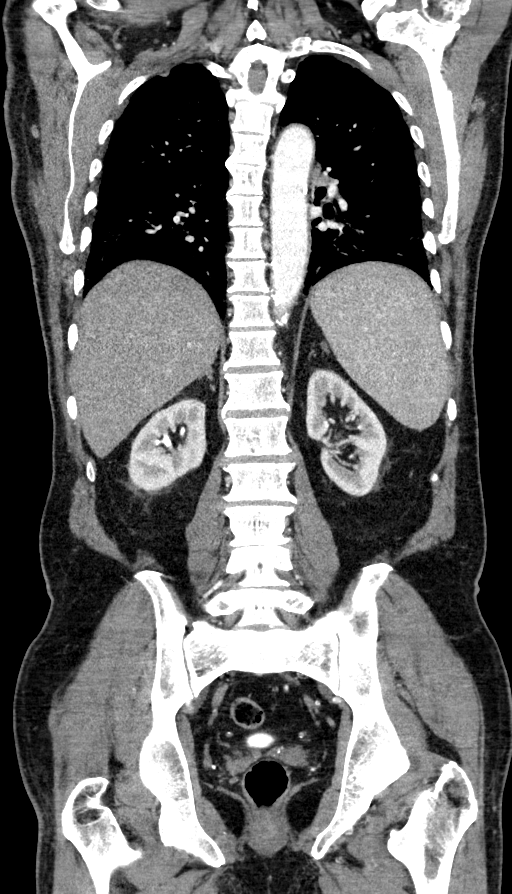

[13 of 36 positions shown; findings below may reference images not displayed]

FINDINGS: CT CHEST FINDINGS

Cardiovascular: Calcified aortic atherosclerosis. Calcified coronary
artery atherosclerosis. Cardiac size appears mildly enlarged now. No
pericardial effusion. Visible central pulmonary arteries appear
patent.

Mediastinum/Nodes: Bilateral axillary lymphadenopathy, not
significantly changed since [REDACTED]. Mediastinal lymph nodes are small
but increased in number, stable since [REDACTED]. Stable similar mildly
increased bilateral hilar lymph nodes.

Lungs/Pleura: Major airways are patent. Lower lung volumes today.
Increased dependent atelectasis in both lungs superimposed on some
centrilobular and paraseptal emphysema. Calcified right upper lobe
granuloma abutting the mediastinum is benign. Tiny additional
calcified granuloma on image 68. No suspicious pulmonary nodule or
opacity.

Musculoskeletal: Thoracic spine detailed separately. Otherwise No
acute osseous abnormality identified. In the chest.

CT ABDOMEN PELVIS FINDINGS

Hepatobiliary: Hepatic steatosis is more apparent. No discrete liver
lesion. Negative gallbladder.

Pancreas: Negative.

Spleen: Splenic volume estimated at 900 mL (normal splenic volume
range 83 - 412 mL). And appears mildly larger since [REDACTED]. No
discrete splenic lesion.

Adrenals/Urinary Tract: Normal adrenal glands. Kidneys, ureters, and
bladder appear negative with some excreted IV contrast present.

Stomach/Bowel: Diverticulosis of the sigmoid colon without active
inflammation. Occasional diverticula in the transverse colon. Mild
redundancy and low-density retained stool throughout the large
bowel. Normal appendix on series 3, image 91. No dilated small
bowel. Negative terminal ileum. Unremarkable stomach and duodenum.
No free air, free fluid.

Vascular/Lymphatic: Extensive Aortoiliac calcified atherosclerosis.
Major arterial structures remain patent. Portal venous system is
patent.

Upper abdominal, porta hepatis lymphadenopathy appears mildly
progressed since [REDACTED] with lymph node short axis size now up to 2
cm (series 3, image 57) versus 17 mm previously. Smaller mostly left
side retroperitoneal lymph nodes also appear mildly increased. No
lymphadenopathy along the pelvic sidewalls. But mild inguinal
lymphadenopathy also appears increased.

Reproductive: Negative.

Other: No pelvic free fluid.

Musculoskeletal: Lumbar spine detailed separately. Otherwise No
acute osseous abnormality identified. In the pelvis.
IMPRESSION: 1. Findings compatible with mild progression of leukemia/lymphoma
since [REDACTED]:
Mildly progressed lymphadenopathy in the abdomen, and Splenomegaly.
Mild, progressed inguinal lymphadenopathy.
Stable bilateral axillary lymphadenopathy, borderline mediastinal
and hilar lymphadenopathy.

2. Mild cardiomegaly now versus accentuated cardiac size from lower
lung volumes. Calcified coronary artery and
Aortic Atherosclerosis ([BK]-[BK]).

3.  Emphysema ([BK]-[BK]).  Mild atelectasis.

4. Hepatic steatosis. Diverticulosis of the colon without active
inflammation.

5. Thoracic and Lumbar Spine detailed by MRI separately.

## 2021-04-02 IMAGING — MR MR THORACIC SPINE WO/W CM
5 of 9 series · 21 of 48 positions shown · IV contrast (gadavist)
Comparison: CT Chest, Abdomen, and Pelvis today are reported
separately.

CLINICAL DATA: 80-year-old male with CLL. Increased fatigue, muscle
weakness, near syncope, shortness of breath, lower extremity
weakness. Query demyelinating disease.

EXAM:
MRI THORACIC WITHOUT AND WITH CONTRAST
TECHNIQUE: Multiplanar and multiecho pulse sequences of the thoracic spine were
obtained without and with intravenous contrast.
CONTRAST:  7mL GADAVIST GADOBUTROL 1 MMOL/ML IV SOLN

[Series 18: T1 · sagittal · 3.3mm · 0.62mm/px · 1 of 7 slices shown (1 of 3)]
[im 1/7]
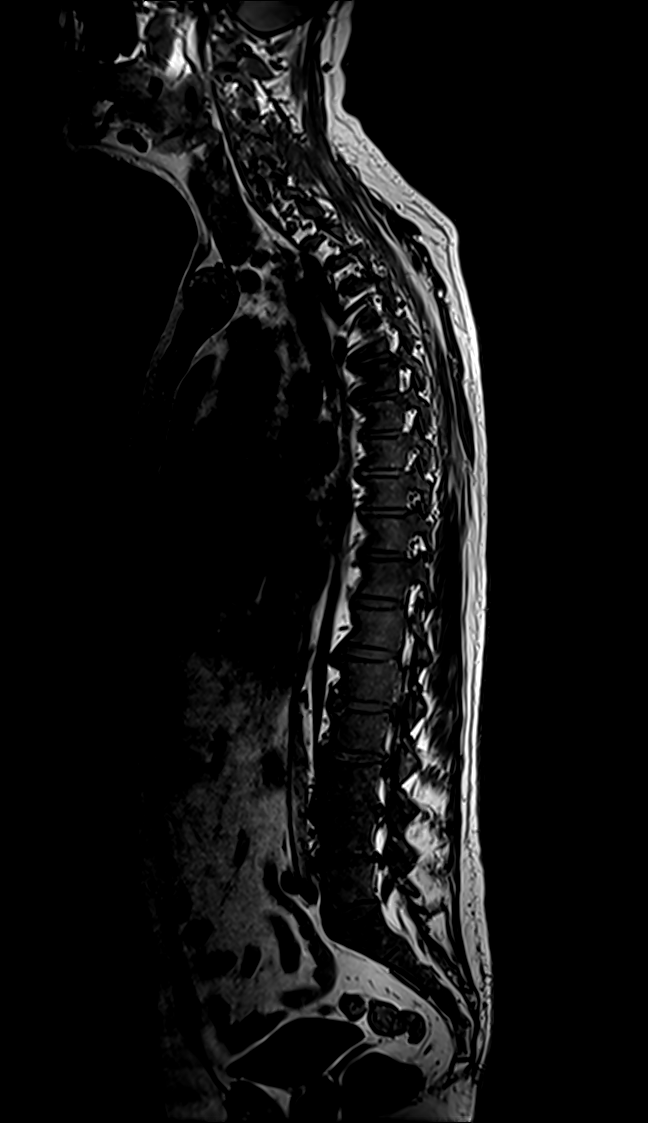

[Series 19: T2 · sagittal · 3.0mm · 0.76mm/px · 3 of 19 slices shown (1 of 2)]
[im 1/19]
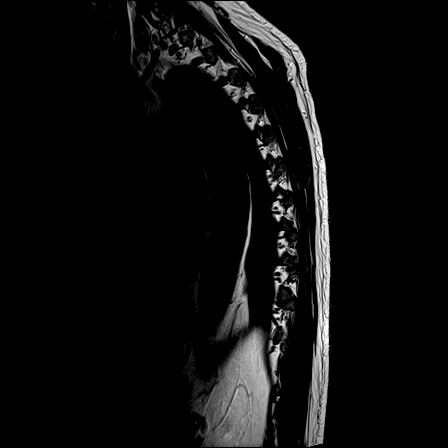
[im 10/19]
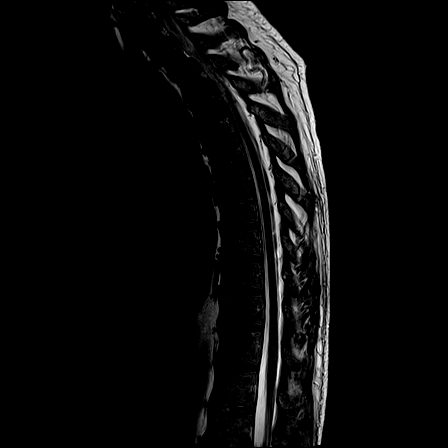
[im 19/19]
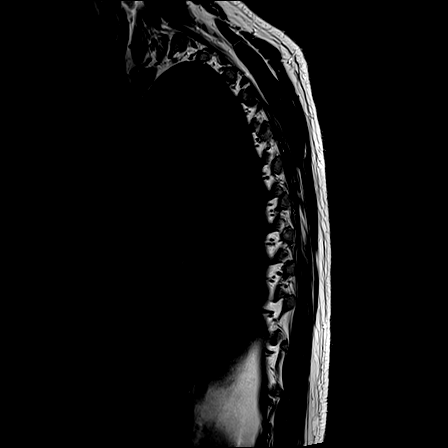

[Series 20: T1 · sagittal · 3.0mm · 0.76mm/px · 4 of 19 slices shown (2 of 3)]
[im 1/19]
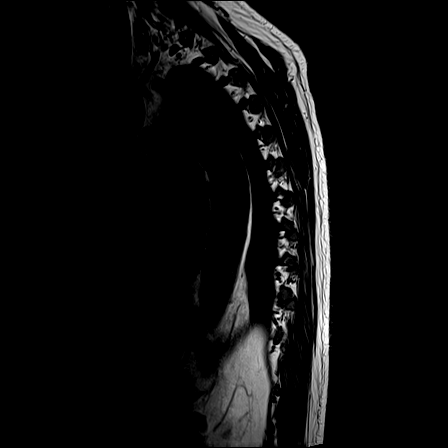
[im 7/19]
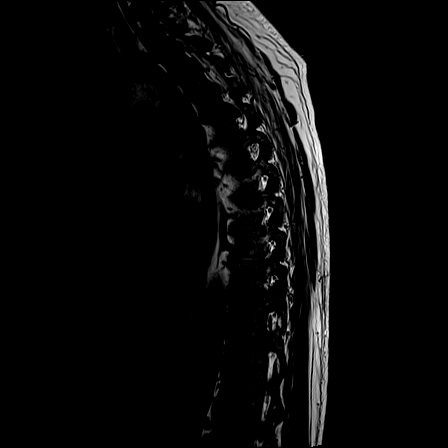
[im 13/19]
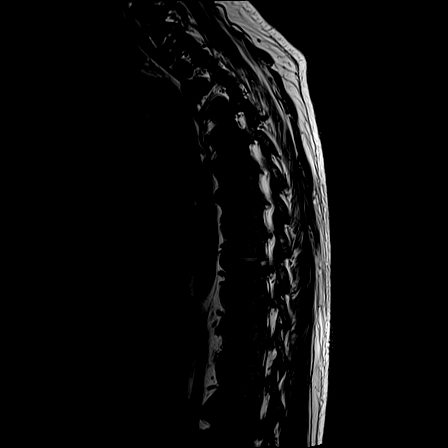
[im 19/19]
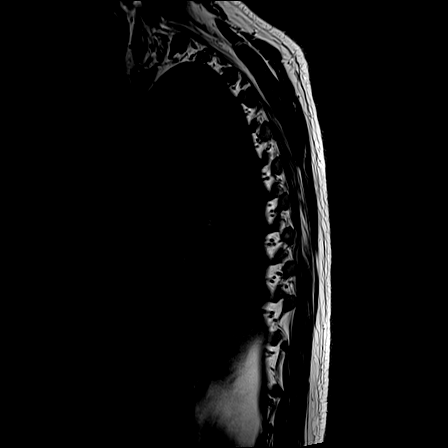

[Series 22: T2 · axial · 5.0mm · 0.59mm/px · z∈[-199,+27]mm · 8 of 39 slices shown (2 of 2)]
[im 1/39]
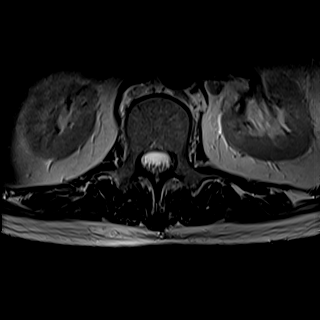
[im 6/39]
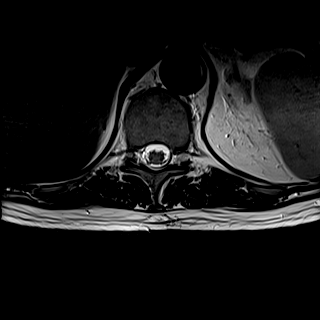
[im 11/39]
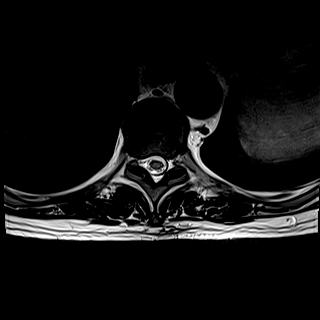
[im 17/39]
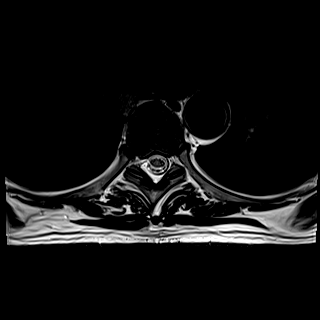
[im 22/39]
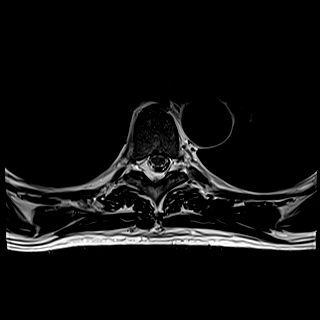
[im 28/39]
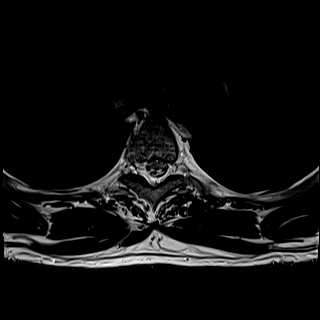
[im 33/39]
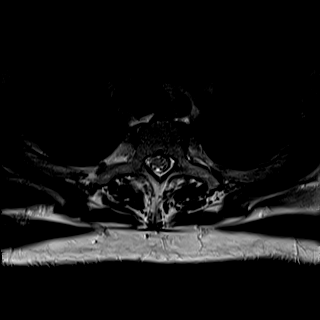
[im 39/39]
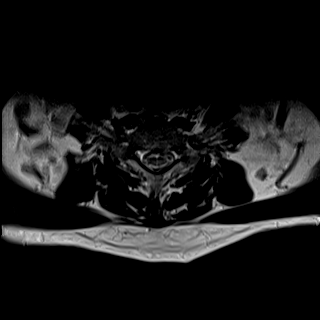

[Series 24: T1 · axial · non-contrast · 5.0mm · 0.31mm/px · z∈[-199,-36]mm · 5 of 39 slices shown (3 of 3)]
[im 1/39]
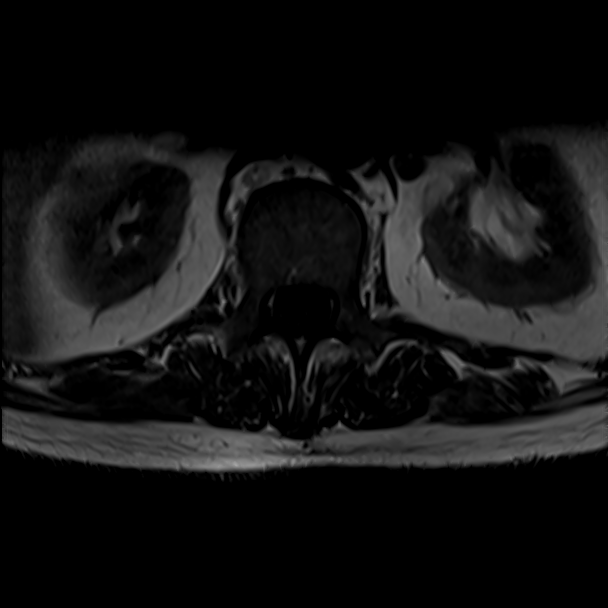
[im 6/39]
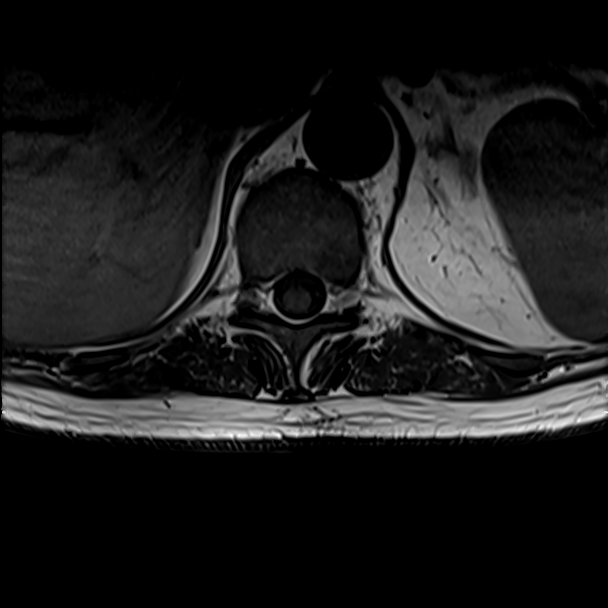
[im 11/39]
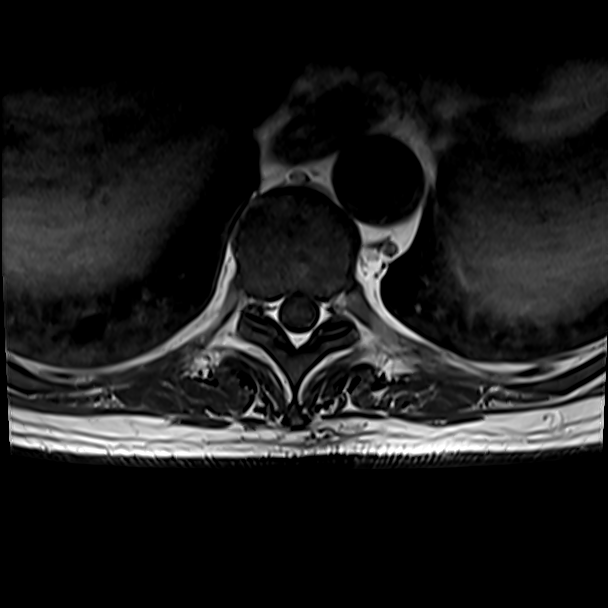
[im 17/39]
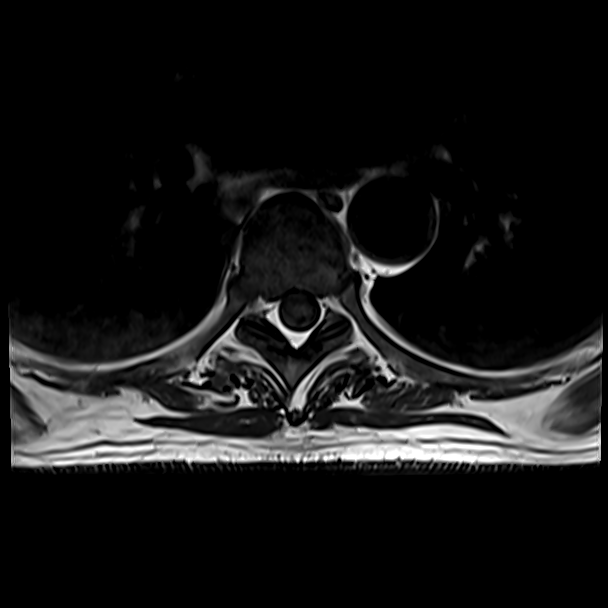
[im 22/39]
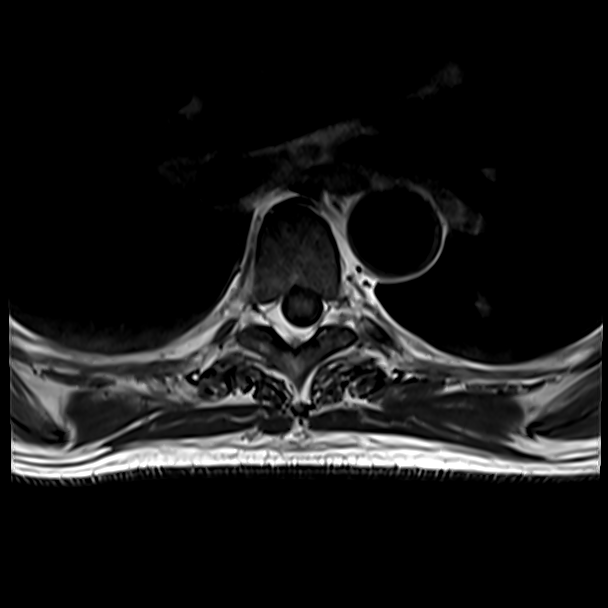

[21 of 48 positions shown; findings below may reference images not displayed]

FINDINGS: Limited cervical spine imaging: Age-appropriate cervical spine
degeneration but diffuse increased T1 marrow signal throughout the
cervical spine.

Thoracic spine segmentation:  Appears to be normal.

Alignment:  Normal for age thoracic kyphosis.

Vertebrae: Diffusely abnormal T1 marrow signal throughout the
visible spine and posterior ribs. Low level generalized increased
STIR signal throughout there is osseous structures. No destructive
or discrete enhancing bone lesion. No vertebral body loss of height.

Cord: Thoracic spinal cord signal is within normal limits
throughout. Conus medullaris is at T12 and appears normal. No
abnormal intradural enhancement or dural thickening. There is mild
generalized thoracic epidural lipomatosis, most pronounced at T4-T5.

Paraspinal and other soft tissues: Chest and abdominal findings are
reported separately today. Small superficial sebaceous cyst left
posterior upper back at the T1 level (series 23, image 3). Thoracic
paraspinal soft tissues remain normal.

Disc levels:

Mild and/or age-appropriate thoracic spine degeneration with no
discrete disc herniation. No significant thoracic spinal stenosis
despite some epidural lipomatosis as stated above. No thoracic
foraminal stenosis.
IMPRESSION: 1. Diffusely abnormal marrow signal in the visible spine and ribs,
indeterminate for infiltration by leukemia/lymphoma versus
widespread red marrow reactivation such as from anemia/chemotherapy.
No destructive bone lesion or compression fracture.

2. Normal thoracic spinal cord. Some thoracic epidural lipomatosis,
but no significant spinal stenosis with underlying mild for age
thoracic spine degeneration.

## 2021-04-02 MED ORDER — GADOBUTROL 1 MMOL/ML IV SOLN
7.0000 mL | Freq: Once | INTRAVENOUS | Status: AC | PRN
  Administered 2021-04-02: 7 mL via INTRAVENOUS

## 2021-04-02 MED ORDER — IOHEXOL 300 MG/ML  SOLN
100.0000 mL | Freq: Once | INTRAMUSCULAR | Status: AC
  Administered 2021-04-02: 100 mL via INTRAVENOUS

## 2021-04-02 MED ORDER — HYDROCODONE-ACETAMINOPHEN 5-325 MG PO TABS: 1.0000 | ORAL_TABLET | Freq: Four times a day (QID) | ORAL | 0 refills | Status: DC | PRN

## 2021-04-02 NOTE — ED Notes (Signed)
Patient discharge instructions and prescriptions reviewed with the patient. The patient verbalized understanding of both. Patient discharged.

## 2021-04-02 NOTE — Discharge Instructions (Addendum)
Take the medications as needed for pain.  Follow-up with Dr. Irene Limbo in the office as we discussed

## 2021-04-02 NOTE — Telephone Encounter (Signed)
Scheduled appts per 7/29 sch msg. Pt aware.

## 2021-04-02 NOTE — ED Provider Notes (Signed)
[facility] EMERGENCY DEPARTMENT Provider Note   CSN: [id number] Arrival date & time: 04/01/21  1853     History Chief Complaint  Patient presents with   Shortness of Breath   Weakness    Tony Banks is a 80 y.o. male.  80 yo M with a chief complaint of shortness of breath on exertion.  Going on for about 4 months now.  Getting progressively worse.  He is to be able to mow his whole lawn and 1 go now has to stop after about every other row.  Also used to ride a stationary bike for about an hour and now can only make it about 5 minutes.  Is also been having some leg pain weakness and cramping.  Has seen his oncologist as well as orthopedics for this.  Has planned to have an MRI performed as well as an ultrasound to evaluate his aorta.  Leg symptoms typically worsen when he lays down to go to bed and then tonight they were so severe he decided to come to the ED for evaluation.  He denies any chest pain or pressure with his shortness of breath.  Denies cough congestion or fever.  Leg pain worse to the thighs sometimes in the calves and then sometimes in his buttocks.  Denies loss of bowel or bladder denies loss of peritoneal sensation.  The history is provided by the patient and the EMS personnel.  Shortness of Breath Severity:  Moderate Onset quality:  Gradual Duration:  4 months Timing:  Constant Progression:  Worsening Chronicity:  New Relieved by:  Nothing Worsened by:  Nothing Ineffective treatments:  None tried Associated symptoms: no abdominal pain, no chest pain, no fever, no headaches, no rash and no vomiting   Weakness Associated symptoms: shortness of breath   Associated symptoms: no abdominal pain, no arthralgias, no chest pain, no diarrhea, no fever, no headaches, no myalgias and no vomiting       Past Medical History:  Diagnosis Date   Aortic atherosclerosis ([place])    CLL (chronic lymphocytic leukemia) ([place])    Constipation    severe    Diabetes mellitus without complication ([place])    Diverticulosis    HLD (hyperlipidemia)    Hypertension    Nephrolithiasis     Patient Active Problem List   Diagnosis Date Noted   Advance directive discussed with patient 04/26/2018   Chalazion left upper eyelid 08/18/2016   Type 2 diabetes mellitus ([place]) 07/13/2015   Hyperlipidemia 04/07/2015   Hearing loss 04/06/2015   Hypertension associated with diabetes ([place]) 10/27/2014   Abdominal pain 04/26/2014   Constipation 04/26/2014   CLL (chronic lymphocytic leukemia) ([place]) 04/26/2014    Past Surgical History:  Procedure Laterality Date   FINGER FRACTURE SURGERY Right    middle finger   SHOULDER ARTHROSCOPY Bilateral    VASECTOMY         Family History  Problem Relation Age of Onset   Heart disease Mother    Stroke Mother    Cancer Father    Stroke Maternal Grandfather    Colon cancer Neg Hx    Pancreatic cancer Neg Hx    Rectal cancer Neg Hx    Stomach cancer Neg Hx     Social History   Tobacco Use   Smoking status: Former    Types: Cigarettes    Quit date: 07/31/1977    Years since quitting: 43.7   Smokeless tobacco: Never   Tobacco comments:  quit 30 yrs ago  Vaping Use   Vaping Use: Never used  Substance Use Topics   Alcohol use: No   Drug use: No    Home Medications Prior to Admission medications   Medication Sig Start Date End Date Taking? Authorizing Provider  ACCU-CHEK AVIVA PLUS test strip USE DAILY OR UP TO 4 TIMES PER DAY AS DIRECTED). USE AS INSTRUCTED 03/10/20  Yes Emeterio Reeve, DO  ACCU-CHEK SOFTCLIX LANCETS lancets Use as instructed 10/26/15  Yes Hommel, Sean, DO  AMBULATORY NON FORMULARY MEDICATION Accu-chek aviva plus test strips and accu-chek softclix lancets, use to check blood sugar up to four times a day. Dx Type 2 diabetes. 08/20/14  Yes Hommel, Sean, DO  atorvastatin (LIPITOR) 10 MG tablet Take 1 tablet (10 mg total) by mouth daily. 03/22/21  Yes Emeterio Reeve, DO  diclofenac  (VOLTAREN) 50 MG EC tablet Take 50 mg by mouth 2 (two) times daily as needed for pain. 01/18/21  Yes [provider]  enalapril (VASOTEC) 2.5 MG tablet TAKE 1 TABLET BY MOUTH EVERY DAY Patient taking differently: Take 2.5 mg by mouth daily. 03/22/21  Yes Emeterio Reeve, DO  HYDROcodone-acetaminophen (NORCO/VICODIN) 5-325 MG tablet Take 1 tablet by mouth every 6 (six) hours as needed. 04/02/21  Yes [name], MD  metFORMIN (GLUCOPHAGE) 500 MG tablet TAKE 2 TABLETS BY MOUTH EVERY DAY WITH BREAKFAST Patient taking differently: Take 1,000 mg by mouth daily with breakfast. 09/29/20  Yes Emeterio Reeve, DO  Multiple Vitamin (MULTIVITAMIN WITH MINERALS) TABS tablet Take 1 tablet daily by mouth.   Yes [provider]  Omega-3 Fatty Acids (FISH OIL PO) Take by mouth.   Yes [provider]  polyethylene glycol (MIRALAX) packet Take 17 g by mouth daily. Patient taking differently: Take 17 g by mouth as needed. 10/26/17  Yes Pyrtle, Lajuan Lines, MD  sildenafil (VIAGRA) 100 MG tablet Take 0.5-1 tablets (50-100 mg total) by mouth daily as needed for erectile dysfunction. 07/02/20  Yes Emeterio Reeve, DO    Allergies    Shellfish allergy  Review of Systems   Review of Systems  Constitutional:  Negative for chills and fever.  HENT:  Negative for congestion and facial swelling.   Eyes:  Negative for discharge and visual disturbance.  Respiratory:  Positive for shortness of breath.   Cardiovascular:  Negative for chest pain and palpitations.  Gastrointestinal:  Negative for abdominal pain, diarrhea and vomiting.  Musculoskeletal:  Negative for arthralgias and myalgias.  Skin:  Negative for color change and rash.  Neurological:  Positive for weakness. Negative for tremors, syncope and headaches.  Psychiatric/Behavioral:  Negative for confusion and dysphoric mood.    Physical Exam Updated Vital Signs BP 134/66   Pulse 77   Temp 98 F (36.7 C) (Oral)   Resp 17   Ht '5\' 8"'$   (1.727 m)   Wt 69.9 kg   SpO2 97%   BMI 23.42 kg/m   Physical Exam Vitals and nursing note reviewed.  Constitutional:      Appearance: He is well-developed.  HENT:     Head: Normocephalic and atraumatic.  Eyes:     Pupils: Pupils are equal, round, and reactive to light.  Neck:     Vascular: No JVD.  Cardiovascular:     Rate and Rhythm: Normal rate and regular rhythm.     Heart sounds: No murmur heard.   No friction rub. No gallop.  Pulmonary:     Effort: No respiratory distress.  Breath sounds: No wheezing.  Abdominal:     General: There is no distension.     Tenderness: There is no abdominal tenderness. There is no guarding or rebound.  Musculoskeletal:        General: Normal range of motion.     Cervical back: Normal range of motion and neck supple.     Comments: Fasciculations mostly to the thighs.  Dopplerable dorsalis pedis pulses bilaterally.  Motor and sensation intact.  Approximately 5 beats of clonus with the right lower extremity.  No appreciable midline spinal tenderness.  Some signs of muscle waisting to the thighs  Skin:    Coloration: Skin is not pale.     Findings: No rash.  Neurological:     Mental Status: He is alert and oriented to person, place, and time.  Psychiatric:        Behavior: Behavior normal.    ED Results / Procedures / Treatments   Labs (all labs ordered are listed, but only abnormal results are displayed) Labs Reviewed  COMPREHENSIVE METABOLIC PANEL - Abnormal; Notable for the following components:      Result Value   Glucose, Bld 154 (*)    All other components within normal limits  CBC WITH DIFFERENTIAL/PLATELET - Abnormal; Notable for the following components:   WBC 121.9 (*)    RBC 3.72 (*)    Hemoglobin 11.7 (*)    HCT 35.9 (*)    Neutro Abs 20.7 (*)    Lymphs Abs 101.2 (*)    Monocytes Absolute 0.0 (*)    All other components within normal limits  OSMOLALITY - Abnormal; Notable for the following components:    Osmolality 302 (*)    All other components within normal limits  MAGNESIUM - Abnormal; Notable for the following components:   Magnesium 2.5 (*)    All other components within normal limits  RESP PANEL BY RT-PCR (FLU A&B, COVID) [id number]  PATHOLOGIST SMEAR REVIEW  TROPONIN I (HIGH SENSITIVITY)  TROPONIN I (HIGH SENSITIVITY)    EKG EKG Interpretation  Date/Time:  Thursday April 01 2021 19:19:33 EDT Ventricular Rate:  92 PR Interval:  164 QRS Duration: 100 QT Interval:  362 QTC Calculation: 447 R Axis:   67 Text Interpretation: Normal sinus rhythm Lateral infarct , age undetermined Abnormal ECG No significant change since last tracing Confirmed by Deno Etienne [phone]) on 04/02/2021 3:13:19 AM  Radiology DG Chest 2 View  Result Date: 04/01/2021 CLINICAL DATA:  Dyspnea with exertion. EXAM: CHEST - 2 VIEW COMPARISON:  None. FINDINGS: The heart size and mediastinal contours are within normal limits. Both lungs are clear. The visualized skeletal structures are unremarkable. IMPRESSION: No active cardiopulmonary disease. Electronically Signed   By: Marijo Conception M.D.   On: 04/01/2021 20:37   MR THORACIC SPINE W WO CONTRAST  Result Date: 04/02/2021 CLINICAL DATA:  80 year old male with CLL. Increased fatigue, muscle weakness, near syncope, shortness of breath, lower extremity weakness. Query demyelinating disease. EXAM: MRI THORACIC WITHOUT AND WITH CONTRAST TECHNIQUE: Multiplanar and multiecho pulse sequences of the thoracic spine were obtained without and with intravenous contrast. CONTRAST:  42m GADAVIST GADOBUTROL 1 MMOL/ML IV SOLN COMPARISON:  CT Chest, Abdomen, and Pelvis today are reported separately. FINDINGS: Limited cervical spine imaging: Age-appropriate cervical spine degeneration but diffuse increased T1 marrow signal throughout the cervical spine. Thoracic spine segmentation:  Appears to be normal. Alignment:  Normal for age thoracic kyphosis. Vertebrae: Diffusely abnormal T1 marrow  signal throughout the visible spine and posterior ribs.  Low level generalized increased STIR signal throughout there is osseous structures. No destructive or discrete enhancing bone lesion. No vertebral body loss of height. Cord: Thoracic spinal cord signal is within normal limits throughout. Conus medullaris is at T12 and appears normal. No abnormal intradural enhancement or dural thickening. There is mild generalized thoracic epidural lipomatosis, most pronounced at T4-T5. Paraspinal and other soft tissues: Chest and abdominal findings are reported separately today. Small superficial sebaceous cyst left posterior upper back at the T1 level (series 23, image 3). Thoracic paraspinal soft tissues remain normal. Disc levels: Mild and/or age-appropriate thoracic spine degeneration with no discrete disc herniation. No significant thoracic spinal stenosis despite some epidural lipomatosis as stated above. No thoracic foraminal stenosis. IMPRESSION: 1. Diffusely abnormal marrow signal in the visible spine and ribs, indeterminate for infiltration by leukemia/lymphoma versus widespread red marrow reactivation such as from anemia/chemotherapy. No destructive bone lesion or compression fracture. 2. Normal thoracic spinal cord. Some thoracic epidural lipomatosis, but no significant spinal stenosis with underlying mild for age thoracic spine degeneration. Electronically Signed   By: Genevie Ann M.D.   On: 04/02/2021 07:27   MR Lumbar Spine W Wo Contrast  Result Date: 04/02/2021 CLINICAL DATA:  80 year old male with CLL. Increased fatigue, muscle weakness, near syncope, shortness of breath, lower extremity weakness. Query demyelinating disease. EXAM: MRI LUMBAR SPINE WITHOUT AND WITH CONTRAST TECHNIQUE: Multiplanar and multiecho pulse sequences of the lumbar spine were obtained without and with intravenous contrast. CONTRAST:  33m GADAVIST GADOBUTROL 1 MMOL/ML IV SOLN COMPARISON:  CT Chest, Abdomen, and Pelvis and thoracic spine  MRI today are reported separately. CT Chest, Abdomen, and Pelvis 11/15/2019. FINDINGS: Segmentation: Normal, concordant with the thoracic spine numbering today. Alignment: Straightening of lumbar lordosis. Stable vertebral height and alignment since March. Vertebrae: Diffusely decreased marrow signal in the visible spine and pelvis, with some areas of endplate sparing related to fatty chronic degenerative endplate marrow signal changes (series 3, image 8). No discrete destructive or enhancing marrow lesion identified. However, there is questionable early extension of abnormal soft tissue into the ventral sacral epidural space at the S1-S2 junction (series 3, image 8). This is not definite, and elsewhere the lumbar epidural space appears normal. Conus medullaris and cauda equina: Conus extends to the T12 level. No lower spinal cord or conus signal abnormality. Cauda equina nerve roots appear normal. No nerve root thickening. No abnormal intradural enhancement (presumed physiologic linear vascular enhancement related to the continuation of the anterior spinal artery on series 6, image 9). No dural thickening or enhancement. Paraspinal and other soft tissues: Abdominal and pelvic viscera are reported separately. Lumbar paraspinal soft tissues remain within normal limits. Disc levels: Chronic lumbar disc and endplate degeneration [id number] L4-L5. Disc bulging and endplate spurring. Mild facet hypertrophy. There is multifactorial mild spinal stenosis at L3-L4, with mild to moderate bilateral L3 neural foraminal stenosis. No other significant stenosis. IMPRESSION: 1. Diffusely abnormal marrow signal throughout the visible spine and pelvis, as seen on the Thoracic MRI. This is indeterminate for infiltration by leukemia/lymphoma versus widespread red marrow reactivation - and there is questionable abnormal extension of soft tissue into the ventral sacral epidural space at the S1-S2. But no destructive bone lesion or  compression fracture. 2. Normal conus medullaris and cauda equina nerve roots, except for mild spinal stenosis and mild to moderate neural foraminal stenosis due to degeneration at L3-L4. 3. See also CT Chest, Abdomen, and Pelvis today reported separately. Electronically Signed   By: HHerminio HeadsD.  On: 04/02/2021 07:36   CT CHEST ABDOMEN PELVIS W CONTRAST  Result Date: 04/02/2021 CLINICAL DATA:  80 year old male with CLL. Increased fatigue, muscle weakness, near syncope, shortness of breath, lower extremity weakness. Query demyelinating disease. EXAM: CT CHEST, ABDOMEN, AND PELVIS WITH CONTRAST TECHNIQUE: Multidetector CT imaging of the chest, abdomen and pelvis was performed following the standard protocol during bolus administration of intravenous contrast. CONTRAST:  131m OMNIPAQUE IOHEXOL 300 MG/ML  SOLN COMPARISON:  Restaging CT Chest, Abdomen, and Pelvis 11/15/2019. Thoracic and lumbar MRI today reported separately. FINDINGS: CT CHEST FINDINGS Cardiovascular: Calcified aortic atherosclerosis. Calcified coronary artery atherosclerosis. Cardiac size appears mildly enlarged now. No pericardial effusion. Visible central pulmonary arteries appear patent. Mediastinum/Nodes: Bilateral axillary lymphadenopathy, not significantly changed since March. Mediastinal lymph nodes are small but increased in number, stable since March. Stable similar mildly increased bilateral hilar lymph nodes. Lungs/Pleura: Major airways are patent. Lower lung volumes today. Increased dependent atelectasis in both lungs superimposed on some centrilobular and paraseptal emphysema. Calcified right upper lobe granuloma abutting the mediastinum is benign. Tiny additional calcified granuloma on image 68. No suspicious pulmonary nodule or opacity. Musculoskeletal: Thoracic spine detailed separately. Otherwise No acute osseous abnormality identified. In the chest. CT ABDOMEN PELVIS FINDINGS Hepatobiliary: Hepatic steatosis is more apparent. No  discrete liver lesion. Negative gallbladder. Pancreas: Negative. Spleen: Splenic volume estimated at 900 mL (normal splenic volume range 83 - 412 mL). And appears mildly larger since March. No discrete splenic lesion. Adrenals/Urinary Tract: Normal adrenal glands. Kidneys, ureters, and bladder appear negative with some excreted IV contrast present. Stomach/Bowel: Diverticulosis of the sigmoid colon without active inflammation. Occasional diverticula in the transverse colon. Mild redundancy and low-density retained stool throughout the large bowel. Normal appendix on series 3, image 91. No dilated small bowel. Negative terminal ileum. Unremarkable stomach and duodenum. No free air, free fluid. Vascular/Lymphatic: Extensive Aortoiliac calcified atherosclerosis. Major arterial structures remain patent. Portal venous system is patent. Upper abdominal, porta hepatis lymphadenopathy appears mildly progressed since March with lymph node short axis size now up to 2 cm (series 3, image 57) versus 17 mm previously. Smaller mostly left side retroperitoneal lymph nodes also appear mildly increased. No lymphadenopathy along the pelvic sidewalls. But mild inguinal lymphadenopathy also appears increased. Reproductive: Negative. Other: No pelvic free fluid. Musculoskeletal: Lumbar spine detailed separately. Otherwise No acute osseous abnormality identified. In the pelvis. IMPRESSION: 1. Findings compatible with mild progression of leukemia/lymphoma since March: Mildly progressed lymphadenopathy in the abdomen, and Splenomegaly. Mild, progressed inguinal lymphadenopathy. Stable bilateral axillary lymphadenopathy, borderline mediastinal and hilar lymphadenopathy. 2. Mild cardiomegaly now versus accentuated cardiac size from lower lung volumes. Calcified coronary artery and Aortic Atherosclerosis (ICD10-I70.0). 3.  Emphysema (ICD10-J43.9).  Mild atelectasis. 4. Hepatic steatosis. Diverticulosis of the colon without active  inflammation. 5. Thoracic and Lumbar Spine detailed by MRI separately. Electronically Signed   By: HGenevie AnnM.D.   On: 04/02/2021 07:20    Procedures Procedures   Medications Ordered in ED Medications  iohexol (OMNIPAQUE) 300 MG/ML solution 100 mL (100 mLs Intravenous Contrast Given 04/02/21 0706)  gadobutrol (GADAVIST) 1 MMOL/ML injection 7 mL (7 mLs Intravenous Contrast Given 04/02/21 [id number]    ED Course  I have reviewed the triage vital signs and the nursing notes.  Pertinent labs & imaging results that were available during my care of the patient were reviewed by me and considered in my medical decision making (see chart for details).    MDM Rules/Calculators/A&P  80 yo M with a chief complaints of shortness of breath on exertion and lower extremity cramping.  Patient has fasciculations on exam to the lower extremities.  Concerning for neurovascular issue.  We will obtain a CT scan to evaluate the vasculature, MRI to evaluate the spine.  Patient has CLL and is not currently having therapy performed.  His white count has continued to trend upward. Will discuss with oncology.   I discussed case with Dr. Lorenso Courier, oncology.  Agreed on imaging studies.  Felt that CLL was unlikely to cause the symptoms and did not feel that the cell counts would be concerning to start treatment.  Signed out to Dr. Tomi Bamberger, please see their note for further details of care in the ED.  The patients results and plan were reviewed and discussed.   Any x-rays performed were independently reviewed by myself.   Differential diagnosis were considered with the presenting HPI.  Medications  iohexol (OMNIPAQUE) 300 MG/ML solution 100 mL (100 mLs Intravenous Contrast Given 04/02/21 0706)  gadobutrol (GADAVIST) 1 MMOL/ML injection 7 mL (7 mLs Intravenous Contrast Given 04/02/21 0624)    Vitals:   04/02/21 0800 04/02/21 0900 04/02/21 0930 04/02/21 1000  BP: 133/60 126/64 134/66   Pulse: 78 81  77   Resp: '20 20 17   '$ Temp:    98 F (36.7 C)  TempSrc:    Oral  SpO2: 98% 98% 97%   Weight:      Height:        Final diagnoses:  Leg pain  Weakness  CLL (chronic lymphocytic leukemia) ([facility])     Final Clinical Impression(s) / ED Diagnoses Final diagnoses:  Leg pain  Weakness  CLL (chronic lymphocytic leukemia) ([facility])    Rx / DC Orders ED Discharge Orders          Ordered    HYDROcodone-acetaminophen (NORCO/VICODIN) 5-325 MG tablet  Every 6 hours PRN        04/02/21 [place], [place], DO 04/03/21 1502

## 2021-04-02 NOTE — ED Provider Notes (Signed)
D/w Dr Marin Olp.  Reviewed findings.  OK for discharge.  Outpt follow up with Dr Irene Limbo.   [name], MD 04/02/21 [phone]

## 2021-04-02 NOTE — ED Notes (Signed)
Patient transported to MRI 

## 2021-04-06 ENCOUNTER — Telehealth: Payer: Self-pay | Admitting: Pharmacist

## 2021-04-06 NOTE — Chronic Care Management (AMB) (Signed)
    Chronic Care Management Pharmacy Assistant   Name: IANCARLO NEUBURGER  MRN: [id number] DOB: [birth year]  Tony Banks is an 80 y.o. year old male who presents for his initial CCM visit with the clinical pharmacist.  Reason for Encounter: Initial CCM Visit  Recent office visits:  02/12/21- Emeterio Reeve, DO- chronic conditions addressed, labs ordered, no medication changes, follow up 6 months  11/02/20- Emeterio Reeve, DO- chronic conditions addressed, labs ordered, no medication changes, follow up 4 months   Recent consult visits:  03/22/21- Toma Copier, MD ( Oncology)- seen for evaluation of chronic lymphocytic leukemia, no medication changes, follow up with PCP and RTC in 4 months  Hospital visits:  Medication Reconciliation was completed by comparing discharge summary, patient's EMR and Pharmacy list, and upon discussion with patient.  Admitted to the hospital on 04/01/2021 due to SOB on exertion. Discharge date was 04/02/2021. Discharged from [facility].    New?Medications Started at [facility] Discharge:?? Hydrocodone- Acetaminophen 5-325 mg q6h prn   All other medications  remain the same after Hospital Discharge:??   Medications: Outpatient Encounter Medications as of 04/06/2021  Medication Sig   ACCU-CHEK AVIVA PLUS test strip USE DAILY OR UP TO 4 TIMES PER DAY AS DIRECTED). USE AS INSTRUCTED   ACCU-CHEK SOFTCLIX LANCETS lancets Use as instructed   AMBULATORY NON FORMULARY MEDICATION Accu-chek aviva plus test strips and accu-chek softclix lancets, use to check blood sugar up to four times a day. Dx Type 2 diabetes.   atorvastatin (LIPITOR) 10 MG tablet Take 1 tablet (10 mg total) by mouth daily.   diclofenac (VOLTAREN) 50 MG EC tablet Take 50 mg by mouth 2 (two) times daily as needed for pain.   enalapril (VASOTEC) 2.5 MG tablet TAKE 1 TABLET BY MOUTH EVERY DAY (Patient taking differently: Take 2.5 mg by mouth daily.)    HYDROcodone-acetaminophen (NORCO/VICODIN) 5-325 MG tablet Take 1 tablet by mouth every 6 (six) hours as needed.   metFORMIN (GLUCOPHAGE) 500 MG tablet TAKE 2 TABLETS BY MOUTH EVERY DAY WITH BREAKFAST (Patient taking differently: Take 1,000 mg by mouth daily with breakfast.)   Multiple Vitamin (MULTIVITAMIN WITH MINERALS) TABS tablet Take 1 tablet daily by mouth.   Omega-3 Fatty Acids (FISH OIL PO) Take by mouth.   polyethylene glycol (MIRALAX) packet Take 17 g by mouth daily. (Patient taking differently: Take 17 g by mouth as needed.)   sildenafil (VIAGRA) 100 MG tablet Take 0.5-1 tablets (50-100 mg total) by mouth daily as needed for erectile dysfunction.   No facility-administered encounter medications on file as of 04/06/2021.   Current Documented Medications atorvastatin 10 MG-90 DS last filled 03/22/21 diclofenac  50 MG EC- 30 DS last filled 01/18/21 enalapril  2.5 MG -90 DS last filled 03/22/21 HYDROcodone-acetaminophen  5-325 MG  metFORMIN  500 MG-90 DS last filled 03/22/21 Multiple Vitamin Omega-3 Fatty Acids  polyethylene glycol packet sildenafil  100 MG   [employer], CMA

## 2021-04-08 ENCOUNTER — Encounter: Payer: Self-pay | Admitting: Osteopathic Medicine

## 2021-04-08 ENCOUNTER — Ambulatory Visit (INDEPENDENT_AMBULATORY_CARE_PROVIDER_SITE_OTHER): Payer: Medicare HMO | Admitting: Osteopathic Medicine

## 2021-04-08 ENCOUNTER — Other Ambulatory Visit: Payer: Self-pay

## 2021-04-08 VITALS — BP 146/55 | HR 80 | Temp 98.4°F | Wt 153.1 lb

## 2021-04-08 DIAGNOSIS — L723 Sebaceous cyst: Secondary | ICD-10-CM

## 2021-04-08 DIAGNOSIS — R937 Abnormal findings on diagnostic imaging of other parts of musculoskeletal system: Secondary | ICD-10-CM | POA: Diagnosis not present

## 2021-04-08 DIAGNOSIS — C9110 Chronic lymphocytic leukemia of B-cell type not having achieved remission: Secondary | ICD-10-CM | POA: Diagnosis not present

## 2021-04-08 DIAGNOSIS — Z125 Encounter for screening for malignant neoplasm of prostate: Secondary | ICD-10-CM

## 2021-04-08 LAB — PSA: PSA: 0.38 ng/mL (ref <=4.00)

## 2021-04-08 NOTE — Progress Notes (Signed)
Tony [facility] is a 80 y.o. male who presents to  [facility]  today, 04/08/21, seeking care for the following:  Skin procedure: 2 sebaceous cysts he used to be able to drain on his own, would like these drained today.  Recent ER visit for weakness, SOB - ongoing about 4 months. Has follow-up in place w/ HemOnc, he reports they requested he get PSA testing in addition to imaging- ER has taken care of imaging and pt is advised to keep appt w/ Onc... MR T/L-spine showed diffusely abnormal signal, indeterminate for infiltration by leukemia/lymphoma versus widespread red marrow reactivation such as from anemia/chemotherapy. No destructive bone lesion or compression fracture.  On CT Chest/Abd/Pelv: Findings compatible with mild progression of leukemia/lymphoma since March:Mildly progressed lymphadenopathy in the abdomen, and Splenomegaly. Mild, progressed inguinal lymphadenopathy. Stable bilateral axillary lymphadenopathy, borderline mediastinal and hilar lymphadenopathy. Mild cardiomegaly. CAD, Aortic Atherosclerosis. Emphysema. Hepatic steatosis.     ASSESSMENT & PLAN with other pertinent findings:  The primary encounter diagnosis was Sebaceous cyst x2: posterior neck, upper back. 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   1. Sebaceous cyst x2: posterior neck, upper back Skin cleaned and anesthetized w/ bupivicaine, small incision w/ sclpel, minimal sebaceous material expressed from lesion on upper L bac, significan sebaceous material expressed from lesion on posterior L neck. Minimal blood loss. Pt tolerated procedure well. Steri strips appplied and wound care dicsussed w/ patient, all questions answered  2. Prostate cancer screening PSA obtained  3. 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 Abnormal MRI scan, bone Keep appt w/  oncology, see results for recent imaging    There are no Patient Instructions on file for this visit.  Orders Placed This Encounter  Procedures   PSA    No orders of the defined types were placed in this encounter.    See below for relevant physical exam findings  See below for recent lab and imaging results reviewed  Medications, allergies, PMH, PSH, SocH, [place] reviewed below    Follow-up instructions: Return in about 6 weeks (around 05/20/2021) for MONITOR A1C, SEE [place] SOONER IF NEEDED! .                                        Exam:  BP (!) 146/55 (BP Location: Left Arm, Patient Position: Sitting, Cuff Size: Normal)   Pulse 80   Temp 98.4 F (36.9 C) (Oral)   Wt 153 lb 1.3 oz (69.4 kg)   BMI 23.28 kg/m  Constitutional: VS see above. General Appearance: alert, well-developed, well-nourished, NAD Neck: No masses, trachea midline.  Respiratory: Normal respiratory effort. Musculoskeletal: Gait normal. Symmetric and independent movement of all extremities Neurological: Normal balance/coordination. No tremor. Skin: warm, dry, intact.  Psychiatric: Normal judgment/insight. Normal mood and affect. Oriented x3.   Current Meds  Medication Sig   ACCU-CHEK AVIVA PLUS test strip USE DAILY OR UP TO 4 TIMES PER DAY AS DIRECTED). USE AS INSTRUCTED   ACCU-CHEK SOFTCLIX LANCETS lancets Use as instructed   AMBULATORY NON FORMULARY MEDICATION Accu-chek aviva plus test strips and accu-chek softclix lancets, use to check blood sugar up to four times a day. Dx Type 2 diabetes.   atorvastatin (LIPITOR) 10 MG tablet Take 1 tablet (10 mg total) by mouth daily.   diclofenac (  VOLTAREN) 50 MG EC tablet Take 50 mg by mouth 2 (two) times daily as needed for pain.   enalapril (VASOTEC) 2.5 MG tablet TAKE 1 TABLET BY MOUTH EVERY DAY (Patient taking differently: Take 2.5 mg by mouth daily.)   HYDROcodone-acetaminophen (NORCO/VICODIN) 5-325 MG tablet Take 1 tablet by  mouth every 6 (six) hours as needed.   metFORMIN (GLUCOPHAGE) 500 MG tablet TAKE 2 TABLETS BY MOUTH EVERY DAY WITH BREAKFAST (Patient taking differently: Take 1,000 mg by mouth daily with breakfast.)   Multiple Vitamin (MULTIVITAMIN WITH MINERALS) TABS tablet Take 1 tablet daily by mouth.   Omega-3 Fatty Acids (FISH OIL PO) Take by mouth.   polyethylene glycol (MIRALAX) packet Take 17 g by mouth daily. (Patient taking differently: Take 17 g by mouth as needed.)   sildenafil (VIAGRA) 100 MG tablet Take 0.5-1 tablets (50-100 mg total) by mouth daily as needed for erectile dysfunction.    Allergies  Allergen Reactions   Shellfish Allergy Other (See Comments)    Leg swelling     Patient Active Problem List   Diagnosis Date Noted   Advance directive discussed with patient 04/26/2018   Chalazion left upper eyelid 08/18/2016   Type 2 diabetes mellitus ([place]) 07/13/2015   Hyperlipidemia 04/07/2015   Hearing loss 04/06/2015   Hypertension associated with diabetes ([place]) 10/27/2014   Abdominal pain 04/26/2014   Constipation 04/26/2014   CLL (chronic lymphocytic leukemia) ([place]) 04/26/2014    Family History  Problem Relation Age of Onset   Heart disease Mother    Stroke Mother    Cancer Father    Stroke Maternal Grandfather    Colon cancer Neg Hx    Pancreatic cancer Neg Hx    Rectal cancer Neg Hx    Stomach cancer Neg Hx     Social History   Tobacco Use  Smoking Status Former   Types: Cigarettes   Quit date: 07/31/1977   Years since quitting: 43.7  Smokeless Tobacco Never  Tobacco Comments   quit 30 yrs ago    Past Surgical History:  Procedure Laterality Date   FINGER FRACTURE SURGERY Right    middle finger   SHOULDER ARTHROSCOPY Bilateral    VASECTOMY      Immunization History  Administered Date(s) Administered   Fluad Quad(high Dose 65+) 07/17/2019   Influenza, High Dose Seasonal PF 06/09/2017, 06/27/2018   Influenza-Unspecified 07/10/2015, 07/06/2016, 06/15/2020    PFIZER(Purple Top)SARS-COV-2 Vaccination 11/30/2019, 12/21/2019, 06/15/2020   Pneumococcal Conjugate-13 04/26/2016   Pneumococcal Polysaccharide-23 04/27/2014    Recent Results (from the past 2160 hour(s))  POCT HgB A1C     Status: Abnormal   Collection Time: 02/12/21  8:20 AM  Result Value Ref Range   Hemoglobin A1C 6.5 (A) 4.0 - 5.6 %   HbA1c POC (<> result, manual entry)     HbA1c, POC (prediabetic range)     HbA1c, POC (controlled diabetic range)    Lactate dehydrogenase     Status: None   Collection Time: 03/22/21  9:50 AM  Result Value Ref Range   LDH 184 98 - 192 U/L    Comment: Performed at [facility], [address], [place], [place] [address]  CMP ([place] only)     Status: Abnormal   Collection Time: 03/22/21  9:50 AM  Result Value Ref Range   Sodium 141 135 - 145 mmol/L   Potassium 4.3 3.5 - 5.1 mmol/L   Chloride 109 98 - 111 mmol/L   CO2 22 22 -  32 mmol/L   Glucose, Bld 140 (H) 70 - 99 mg/dL    Comment: Glucose reference range applies only to samples taken after fasting for at least 8 hours.   BUN 17 8 - 23 mg/dL   Creatinine 1.22 0.61 - 1.24 mg/dL   Calcium 9.4 8.9 - 10.3 mg/dL   Total Protein 7.6 6.5 - 8.1 g/dL   Albumin 4.3 3.5 - 5.0 g/dL   AST 21 15 - 41 U/L   ALT 21 0 - 44 U/L   Alkaline Phosphatase 91 38 - 126 U/L   Total Bilirubin 0.9 0.3 - 1.2 mg/dL   GFR, Estimated >60 >60 mL/min    Comment: (NOTE) Calculated using the CKD-EPI Creatinine Equation (2021)    Anion gap 10 5 - 15    Comment: Performed at [facility], [place] [address], [place], [place] [address]  CBC with Differential/Platelet     Status: Abnormal   Collection Time: 03/22/21  9:50 AM  Result Value Ref Range   WBC 96.4 (HH) 4.0 - 10.5 K/uL    Comment: This critical result has verified and been called to [facility] by Dorian Furnace on 07 18 2022 at 1002, and has been read back.    RBC 3.75 (L) 4.22 - 5.81 MIL/uL   Hemoglobin  11.7 (L) 13.0 - 17.0 g/dL   HCT 35.4 (L) 39.0 - 52.0 %   MCV 94.4 80.0 - 100.0 fL   MCH 31.2 26.0 - 34.0 pg   MCHC 33.1 30.0 - 36.0 g/dL   RDW 14.1 11.5 - 15.5 %   Platelets 137 (L) 150 - 400 K/uL   nRBC 0.0 0.0 - 0.2 %   Neutrophils Relative % 8 %   Neutro Abs 7.9 (H) 1.7 - 7.7 K/uL   Lymphocytes Relative 83 %   Lymphs Abs 79.5 (H) 0.7 - 4.0 K/uL   Monocytes Relative 8 %   Monocytes Absolute 7.9 (H) 0.1 - 1.0 K/uL   Eosinophils Relative 0 %   Eosinophils Absolute 0.4 0.0 - 0.5 K/uL   Basophils Relative 0 %   Basophils Absolute 0.1 0.0 - 0.1 K/uL   WBC Morphology SMUDGE CELLS     Comment: VARIANT LYMPHS PRESENT   Immature Granulocytes 1 %   Abs Immature Granulocytes 0.73 (H) 0.00 - 0.07 K/uL    Comment: Performed at [facility], [place] [address], [place], [place] [address]  Comprehensive metabolic panel     Status: Abnormal   Collection Time: 04/01/21  7:48 PM  Result Value Ref Range   Sodium 138 135 - 145 mmol/L   Potassium 5.1 3.5 - 5.1 mmol/L   Chloride 108 98 - 111 mmol/L   CO2 22 22 - 32 mmol/L   Glucose, Bld 154 (H) 70 - 99 mg/dL    Comment: Glucose reference range applies only to samples taken after fasting for at least 8 hours.   BUN 18 8 - 23 mg/dL   Creatinine, Ser 1.15 0.61 - 1.24 mg/dL   Calcium 9.6 8.9 - 10.3 mg/dL   Total Protein 7.3 6.5 - 8.1 g/dL   Albumin 4.3 3.5 - 5.0 g/dL   AST 28 15 - 41 U/L   ALT 20 0 - 44 U/L   Alkaline Phosphatase 88 38 - 126 U/L   Total Bilirubin 0.7 0.3 - 1.2 mg/dL   GFR, Estimated >60 >60 mL/min    Comment: (NOTE) Calculated using the CKD-EPI Creatinine Equation (2021)  Anion gap 8 5 - 15    Comment: Performed at [facility] [address], [place], [place] [address]  CBC with Differential     Status: Abnormal   Collection Time: 04/01/21  7:48 PM  Result Value Ref Range   WBC 121.9 (HH) 4.0 - 10.5 K/uL    Comment: REPEATED TO VERIFY THIS CRITICAL RESULT HAS VERIFIED AND BEEN CALLED TO  KRISTINA MUNNETT RN BY CAROL PHILLIPS ON 07 28 2022 AT 2033, AND HAS BEEN READ BACK.  THIS CRITICAL RESULT HAS VERIFIED AND BEEN CALLED TO KRISTINA MUNNETT RN BY CAROL PHILLIPS ON 07 28 2022 AT 2034, AND HAS BEEN READ BACK.     RBC 3.72 (L) 4.22 - 5.81 MIL/uL   Hemoglobin 11.7 (L) 13.0 - 17.0 g/dL   HCT 35.9 (L) 39.0 - 52.0 %   MCV 96.5 80.0 - 100.0 fL   MCH 31.5 26.0 - 34.0 pg   MCHC 32.6 30.0 - 36.0 g/dL   RDW 14.3 11.5 - 15.5 %   Platelets 161 150 - 400 K/uL   nRBC 0.0 0.0 - 0.2 %   Neutrophils Relative % 17 %   Neutro Abs 20.7 (H) 1.7 - 7.7 K/uL   Lymphocytes Relative 83 %   Lymphs Abs 101.2 (H) 0.7 - 4.0 K/uL   Monocytes Relative 0 %   Monocytes Absolute 0.0 (L) 0.1 - 1.0 K/uL   Eosinophils Relative 0 %   Eosinophils Absolute 0.0 0.0 - 0.5 K/uL   Basophils Relative 0 %   Basophils Absolute 0.0 0.0 - 0.1 K/uL   WBC Morphology PATH REVIEW ORDERED    RBC Morphology MORPHOLOGY UNREMARKABLE    Smear Review MORPHOLOGY UNREMARKABLE    Abs Immature Granulocytes 0.00 0.00 - 0.07 K/uL    Comment: Performed at [facility], [place] [address] [place], [place] [address]  Troponin I (High Sensitivity)     Status: None   Collection Time: 04/01/21  7:48 PM  Result Value Ref Range   Troponin I (High Sensitivity) 12 <18 ng/L    Comment: (NOTE) Elevated high sensitivity troponin I (hsTnI) values and significant  changes across serial measurements may suggest ACS but many other  chronic and acute conditions are known to elevate hsTnI results.  Refer to the "Links" section for chest pain algorithms and additional  guidance. Performed at [facility], [place] [address] [place], [place] [address]   Pathologist smear review     Status: None   Collection Time: 04/01/21  7:48 PM  Result Value Ref Range   Path Review Consistent with Chronic Lymphocytic Leukemia.     Comment: Reviewed by Marlynn Perking. Melina Copa, M.D. 04/02/2021. Performed at [facility], [place] [address], [place],  [place] [address]   Osmolality     Status: Abnormal   Collection Time: 04/01/21  9:36 PM  Result Value Ref Range   Osmolality 302 (H) 275 - 295 mOsm/kg    Comment: Performed at [facility], [place] [address], [place], [place] [address]  Troponin I (High Sensitivity)     Status: None   Collection Time: 04/01/21  9:36 PM  Result Value Ref Range   Troponin I (High Sensitivity) 12 <18 ng/L    Comment: (NOTE) Elevated high sensitivity troponin I (hsTnI) values and significant  changes across serial measurements may suggest ACS but many other  chronic and acute conditions are known to elevate hsTnI results.  Refer to the "Links" section for  chest pain algorithms and additional  guidance. Performed at [facility], [place] [address], [place], [name] [address]   Magnesium     Status: Abnormal   Collection Time: 04/01/21  9:36 PM  Result Value Ref Range   Magnesium 2.5 (H) 1.7 - 2.4 mg/dL    Comment: Performed at [facility] [address] [place], [place] [address]  Resp Panel by RT-PCR (Flu A&B, Covid) Nasopharyngeal Swab     Status: None   Collection Time: 04/02/21  6:42 AM   Specimen: Nasopharyngeal Swab; Nasopharyngeal(NP) swabs in vial transport medium  Result Value Ref Range   SARS Coronavirus 2 by RT PCR NEGATIVE NEGATIVE    Comment: (NOTE) SARS-CoV-2 target nucleic acids are NOT DETECTED.  The SARS-CoV-2 RNA is generally detectable in upper respiratory specimens during the acute phase of infection. The lowest concentration of SARS-CoV-2 viral copies this assay can detect is 138 copies/mL. A negative result does not preclude SARS-Cov-2 infection and should not be used as the sole basis for treatment or other patient management decisions. A negative result may occur with  improper specimen collection/handling, submission of specimen other than nasopharyngeal swab, presence of viral mutation(s) within the areas targeted by this assay, and inadequate number of  viral copies(<138 copies/mL). A negative result must be combined with clinical observations, patient history, and epidemiological information. The expected result is Negative.  Fact Sheet for Patients:  EntrepreneurPulse.com.au  Fact Sheet for Healthcare Providers:  IncredibleEmployment.be  This test is no t yet approved or cleared by the [place] FDA and  has been authorized for detection and/or diagnosis of SARS-CoV-2 by FDA under an Emergency Use Authorization (EUA). This EUA will remain  in effect (meaning this test can be used) for the duration of the COVID-19 declaration under Section 564(b)(1) of the Act, 21 U.S.C.section 360bbb-3(b)(1), unless the authorization is terminated  or revoked sooner.       Influenza A by PCR NEGATIVE NEGATIVE   Influenza B by PCR NEGATIVE NEGATIVE    Comment: (NOTE) The Xpert Xpress SARS-CoV-2/FLU/RSV plus assay is intended as an aid in the diagnosis of influenza from Nasopharyngeal swab specimens and should not be used as a sole basis for treatment. Nasal washings and aspirates are unacceptable for Xpert Xpress SARS-CoV-2/FLU/RSV testing.  Fact Sheet for Patients: EntrepreneurPulse.com.au  Fact Sheet for Healthcare Providers: IncredibleEmployment.be  This test is not yet approved or cleared by the [place] FDA and has been authorized for detection and/or diagnosis of SARS-CoV-2 by FDA under an Emergency Use Authorization (EUA). This EUA will remain in effect (meaning this test can be used) for the duration of the COVID-19 declaration under Section 564(b)(1) of the Act, 21 U.S.C. section 360bbb-3(b)(1), unless the authorization is terminated or revoked.  Performed at [facility], [place] [address], [place], [place] [address]   PSA     Status: None   Collection Time: 04/08/21 12:00 AM  Result Value Ref Range   PSA 0.38 < OR = 4.00 ng/mL    Comment:  The total PSA value from this assay system is  standardized against the WHO standard. The test  result will be approximately 20% lower when compared  to the equimolar-standardized total PSA (Beckman  Coulter). Comparison of serial PSA results should be  interpreted with this fact in mind. . This test was performed using the Siemens  chemiluminescent method. Values obtained from  different assay methods cannot be used interchangeably. PSA levels, regardless of value,  should not be interpreted as absolute evidence of the presence or absence of disease.     No results found.     All questions at time of visit were answered - patient instructed to contact office with any additional concerns or updates. ER/RTC precautions were reviewed with the patient as applicable.   Please note: manual typing as well as voice recognition software may have been used to produce this document - typos may escape review. Please contact Dr. Sheppard Coil for any needed clarifications.

## 2021-04-12 ENCOUNTER — Other Ambulatory Visit: Payer: Self-pay

## 2021-04-12 ENCOUNTER — Ambulatory Visit (INDEPENDENT_AMBULATORY_CARE_PROVIDER_SITE_OTHER): Payer: Medicare HMO | Admitting: Pharmacist

## 2021-04-12 DIAGNOSIS — E1159 Type 2 diabetes mellitus with other circulatory complications: Secondary | ICD-10-CM

## 2021-04-12 DIAGNOSIS — E119 Type 2 diabetes mellitus without complications: Secondary | ICD-10-CM | POA: Diagnosis not present

## 2021-04-12 DIAGNOSIS — I152 Hypertension secondary to endocrine disorders: Secondary | ICD-10-CM

## 2021-04-12 DIAGNOSIS — E785 Hyperlipidemia, unspecified: Secondary | ICD-10-CM | POA: Diagnosis not present

## 2021-04-12 NOTE — Progress Notes (Signed)
Chronic Care Management Pharmacy Note  04/12/2021 Name:  Tony Banks MRN:  [id number] DOB:  1941-01-14  Summary: addressed HTN, HLD, DM  Recommendations/Changes made from today's visit: Recommended consider increase to enalapril 38m daily in order to target goal <130/80  Plan: f/u with pharmacist in 6 months  Subjective: Tony VANVRANKENis an 80y.o. year old male who is a primary patient of [name] DO.  The CCM team was consulted for assistance with disease management and care coordination needs.    Engaged with patient by telephone for initial visit in response to provider referral for pharmacy case management and/or care coordination services.   Consent to Services:  The patient was given information about Chronic Care Management services, agreed to services, and gave verbal consent prior to initiation of services.  Please see initial visit note for detailed documentation.   Patient Care Team: [name] DO as PCP - General (Osteopathic Medicine) KDarius Bump RThe [facility] Centeras Pharmacist (Pharmacist)  Recent office visits:  02/12/21- [name] DO- chronic conditions addressed, labs ordered, no medication changes, follow up 6 months 11/02/20- [name] DO- chronic conditions addressed, labs ordered, no medication changes, follow up 4 months    Recent consult visits:  03/22/21- [name] MD ( Oncology)- seen for evaluation of chronic lymphocytic leukemia, no medication changes, follow up with PCP and RTC in 4 months   Hospital visits:  Medication Reconciliation was completed by comparing discharge summary, patient's EMR and Pharmacy list, and upon discussion with patient.   Admitted to the hospital on 04/01/2021 due to SOB on exertion. Discharge date was 04/02/2021[facility]     [facility]- Acetaminophen 5-325 mg q6h prn    All  other medications  remain the same after Hospital Discharge:??  Objective:  Lab Results  Component Value Date   CREATININE 1.15 04/01/2021   CREATININE 1.22 03/22/2021   CREATININE 1.17 10/02/2020    Lab Results  Component Value Date   HGBA1C 6.5 (A) 02/12/2021   Last diabetic Eye exam:  Lab Results  Component Value Date/Time   HMDIABEYEEXA No Retinopathy 04/30/2018 12:00 AM    Last diabetic Foot exam: No results found for: HMDIABFOOTEX      Component Value Date/Time   CHOL 114 11/02/2020 0000   TRIG 104 11/02/2020 0000   HDL 31 (L) 11/02/2020 0000   CHOLHDL 3.7 11/02/2020 0000   VLDL 14 04/19/2017 0808   LDLCALC 64 11/02/2020 0000    Hepatic Function Latest Ref Rng & Units 04/01/2021 03/22/2021 10/02/2020  Total Protein 6.5 - 8.1 g/dL 7.3 7.6 7.5  Albumin 3.5 - 5.0 g/dL 4.3 4.3 4.3  AST 15 - 41 U/L _0 ALT 0 - 44 U/L _1 Alk Phosphatase 38 - 126 U/L 88 91 92  Total Bilirubin 0.3 - 1.2 mg/dL 0.7 0.9 0.7    Lab Results  Component Value Date/Time   TSH 2.39 03/24/2020 09:29 AM   TSH 1.42 10/27/2017 11:22 AM   FREET4 1.0 10/27/2017 11:22 AM    CBC Latest Ref Rng & Units 04/01/2021 03/22/2021 10/02/2020  WBC 4.0 - 10.5 K/uL 121.9(HH) 96.4(HH) 66.9(HH)  Hemoglobin 13.0 - 17.0 g/dL 11.7(L) 11.7(L) 12.6(L)  Hematocrit 39.0 - 52.0 % 35.9(L) 35.4(L) 38.8(L)  Platelets 150 - 400 K/uL 161 137(L) 160    Social History   Tobacco Use  Smoking Status Former   Types: Cigarettes  Quit date: 07/31/1977   Years since quitting: 43.7  Smokeless Tobacco Never  Tobacco Comments   quit 30 yrs ago   BP Readings from Last 3 Encounters:  04/08/21 (!) 146/55  04/02/21 134/66  03/22/21 132/68   Pulse Readings from Last 3 Encounters:  04/08/21 80  04/02/21 77  03/22/21 89   Wt Readings from Last 3 Encounters:  04/08/21 153 lb 1.3 oz (69.4 kg)  04/01/21 154 lb (69.9 kg)  03/22/21 156 lb 4.8 oz (70.9 kg)    Assessment: Review of patient past medical history,  allergies, medications, health status, including review of consultants reports, laboratory and other test data, was performed as part of comprehensive evaluation and provision of chronic care management services.   SDOH:  (Social Determinants of Health) assessments and interventions performed:    CCM Care Plan  Allergies  Allergen Reactions   Shellfish Allergy Other (See Comments)    Leg swelling     Medications Reviewed Today     Reviewed by Mertha Finders, CMA (Certified Medical Assistant) on 04/08/21 at 0759  Med List Status: <None>   Medication Order Taking? Sig Documenting Provider Last Dose Status Informant  ACCU-CHEK AVIVA PLUS test strip [id number] Yes USE DAILY OR UP TO 4 TIMES PER DAY AS DIRECTED). USE AS INSTRUCTED Emeterio Reeve, DO Taking Active Self  ACCU-CHEK SOFTCLIX LANCETS lancets [id number] Yes Use as instructed Marcial Pacas, DO Taking Active Self  AMBULATORY NON FORMULARY MEDICATION [id number] Yes Accu-chek aviva plus test strips and accu-chek softclix lancets, use to check blood sugar up to four times a day. Dx Type 2 diabetes. Marcial Pacas, DO Taking Active Self  atorvastatin (LIPITOR) 10 MG tablet [id number] Yes Take 1 tablet (10 mg total) by mouth daily. Emeterio Reeve, DO Taking Active Self  diclofenac (VOLTAREN) 50 MG EC tablet [id number] Yes Take 50 mg by mouth 2 (two) times daily as needed for pain. [provider] Taking Active Self  enalapril (VASOTEC) 2.5 MG tablet [id number] Yes TAKE 1 TABLET BY MOUTH EVERY DAY  Patient taking differently: Take 2.5 mg by mouth daily.   Emeterio Reeve, DO Taking Active Self  HYDROcodone-acetaminophen (NORCO/VICODIN) 5-325 MG tablet [id number] Yes Take 1 tablet by mouth every 6 (six) hours as needed. [name], MD Taking Active   metFORMIN (GLUCOPHAGE) 500 MG tablet [id number] Yes TAKE 2 TABLETS BY MOUTH EVERY DAY WITH BREAKFAST  Patient taking differently: Take 1,000 mg by mouth daily with breakfast.    Emeterio Reeve, DO Taking Active   Multiple Vitamin (MULTIVITAMIN WITH MINERALS) TABS tablet [id number] Yes Take 1 tablet daily by mouth. [provider] Taking Active Self  Omega-3 Fatty Acids (FISH OIL PO) [id number] Yes Take by mouth. [provider] Taking Active Self  polyethylene glycol (MIRALAX) packet [id number] Yes Take 17 g by mouth daily.  Patient taking differently: Take 17 g by mouth as needed.   Jerene Bears, MD Taking Active            Med Note Reather Littler   Fri Apr 02, 2021  3:58 AM)    sildenafil (VIAGRA) 100 MG tablet [id number] Yes Take 0.5-1 tablets (50-100 mg total) by mouth daily as needed for erectile dysfunction. Emeterio Reeve, DO Taking Active Self            Patient Active Problem List   Diagnosis Date Noted   Advance directive discussed with patient 04/26/2018   Chalazion left upper eyelid 08/18/2016   Type 2 diabetes mellitus ([place]) 07/13/2015  Hyperlipidemia 04/07/2015   Hearing loss 04/06/2015   Hypertension associated with diabetes ([place]) 10/27/2014   Abdominal pain 04/26/2014   Constipation 04/26/2014   CLL (chronic lymphocytic leukemia) ([place]) 04/26/2014    Immunization History  Administered Date(s) Administered   Fluad Quad(high Dose 65+) 07/17/2019   Influenza, High Dose Seasonal PF 06/09/2017, 06/27/2018   Influenza-Unspecified 07/10/2015, 07/06/2016, 06/15/2020   PFIZER(Purple Top)SARS-COV-2 Vaccination 11/30/2019, 12/21/2019, 06/15/2020   Pneumococcal Conjugate-13 04/26/2016   Pneumococcal Polysaccharide-23 04/27/2014    Conditions to be addressed/monitored: HTN, HLD, and DMII  There are no care plans that you recently modified to display for this patient.   Medication Assistance: None required.  Patient affirms current coverage meets needs.  Patient's preferred pharmacy is:  [id number] [place]Phone: [phone]:  [phone] Uses pill box? No - set up on dresser in bedroom, working well for him Pt endorses 80% compliance  Follow Up:  Patient agrees to Care Plan and Follow-up.  Plan: Telephone follow up appointment with care management team member scheduled for:  6 months  KDarius Bump

## 2021-04-12 NOTE — Patient Instructions (Signed)
Visit Information   PATIENT GOALS:   Goals Addressed             This Visit's Progress    Medication Management       Patient Goals/Self-Care Activities Over the next 180 days, patient will:  take medications as prescribed  Follow Up Plan: Telephone follow up appointment with care management team member scheduled for:  6 months         Consent to CCM Services: Mr. Merlo was given information about Chronic Care Management services including:  CCM service includes personalized support from designated clinical staff supervised by his physician, including individualized plan of care and coordination with other care providers 24/7 contact phone numbers for assistance for urgent and routine care needs. Service will only be billed when office clinical staff spend 20 minutes or more in a month to coordinate care. Only one practitioner may furnish and bill the service in a calendar month. The patient may stop CCM services at any time (effective at the end of the month) by phone call to the office staff. The patient will be responsible for cost sharing (co-pay) of up to 20% of the service fee (after annual deductible is met).  Patient agreed to services and verbal consent obtained.   The patient verbalized understanding of instructions, educational materials, and care plan provided today and agreed to receive a mailed copy of patient instructions, educational materials, and care plan.   Telephone follow up appointment with care management team member scheduled for: 6 months [place]: Patient Care Plan: Medication Management     Problem Identified: DM, HTN, HLD      Long-Range Goal: Disease Progression Prevention   Start Date: 04/12/2021  This Visit's Progress: On track  Priority: High  Note:   Current Barriers:  None at present  Pharmacist Clinical Goal(s):  Over the next 180 days, patient will maintain control of chronic conditions as evidenced by  lab values, vital signs, and medication fill history  through collaboration with PharmD and provider.   Interventions: 1:1 collaboration with Emeterio Reeve, DO regarding development and update of comprehensive plan of care as evidenced by provider attestation and co-signature Inter-disciplinary care team collaboration (see longitudinal plan of care) Comprehensive medication review performed; medication list updated in electronic medical record  Diabetes:  Controlled; current treatment:metformin 1g daily ; a1c 6.5  Current glucose readings: fasting glucose: 120s,   Denies hypoglycemic/hyperglycemic symptoms  Current meal patterns: breakfast: egg sandwich on Wednesdays, fiber one cereal, Jimmy dean breakfast sandwich ; lunch: sandwich (tuna salad or PB), bag of chips; dinner: pork chops (&baked beans, pinto beans, or mashed potatoes), spaghetti, chili, salad few nights per week; snacks: tries to cut back snacking, describes himself as a "sweets hog" (honey buns, cookies,cake, candy); drinks: water, minimally diet mt dew, coffee in AM  Current exercise: incline table for back but quit that recently, had a bicycle but bothered hips & knees. Walks granddaughters dog, and mowing the yard.   Educated on A1c, fasting, and postprandial BG goals as well as diet/nutrition Recommended continue current regimen,  Hypertension:  Uncontrolled; current treatment:enalapril 2.48m daily;   Current home readings: doesn't check at home, states usually runs SBP 140s  Denies hypotensive/hypertensive symptoms  Recommended consider increase to enalapril 543mdaily in order to target goal <130/80, and  Hyperlipidemia:  Controlled; current treatment:atorvastatin 1040maily, omega 3FA daily; LDL 64  Recommended continue current regimen  Patient Goals/Self-Care Activities Over the next 180  days, patient will:  take medications as prescribed  Follow Up Plan: Telephone follow up appointment with care management team  member scheduled for:  6 months

## 2021-04-13 MED ORDER — ENALAPRIL MALEATE 5 MG PO TABS: 5.0000 mg | ORAL_TABLET | Freq: Every day | ORAL | 1 refills | Status: AC

## 2021-04-13 NOTE — Progress Notes (Signed)
Sent increased Enalapril!

## 2021-04-13 NOTE — Addendum Note (Signed)
Addended by: Maryla Morrow on: 04/13/2021 10:33 AM   Modules accepted: Orders

## 2021-04-16 ENCOUNTER — Telehealth: Payer: Self-pay | Admitting: Hematology

## 2021-04-16 NOTE — Telephone Encounter (Signed)
Rescheduled upcoming appointment due to provider's emergency. Patient is aware of changes. 

## 2021-04-19 ENCOUNTER — Inpatient Hospital Stay: Payer: Medicare HMO

## 2021-04-19 ENCOUNTER — Inpatient Hospital Stay: Payer: Medicare HMO | Admitting: Hematology

## 2021-04-21 ENCOUNTER — Other Ambulatory Visit: Payer: Self-pay

## 2021-04-21 DIAGNOSIS — C9110 Chronic lymphocytic leukemia of B-cell type not having achieved remission: Secondary | ICD-10-CM

## 2021-04-22 ENCOUNTER — Inpatient Hospital Stay: Payer: Medicare HMO | Attending: Hematology

## 2021-04-22 ENCOUNTER — Inpatient Hospital Stay: Payer: Medicare HMO | Admitting: Hematology

## 2021-04-22 ENCOUNTER — Other Ambulatory Visit: Payer: Self-pay

## 2021-04-22 VITALS — BP 139/65 | HR 75 | Temp 98.8°F | Resp 16 | Ht 68.0 in | Wt 152.9 lb

## 2021-04-22 DIAGNOSIS — Z87891 Personal history of nicotine dependence: Secondary | ICD-10-CM | POA: Diagnosis not present

## 2021-04-22 DIAGNOSIS — Z79899 Other long term (current) drug therapy: Secondary | ICD-10-CM | POA: Insufficient documentation

## 2021-04-22 DIAGNOSIS — I700 Atherosclerosis of aorta: Secondary | ICD-10-CM | POA: Diagnosis not present

## 2021-04-22 DIAGNOSIS — E119 Type 2 diabetes mellitus without complications: Secondary | ICD-10-CM | POA: Diagnosis not present

## 2021-04-22 DIAGNOSIS — K5900 Constipation, unspecified: Secondary | ICD-10-CM | POA: Diagnosis not present

## 2021-04-22 DIAGNOSIS — Z87442 Personal history of urinary calculi: Secondary | ICD-10-CM | POA: Diagnosis not present

## 2021-04-22 DIAGNOSIS — C9110 Chronic lymphocytic leukemia of B-cell type not having achieved remission: Secondary | ICD-10-CM

## 2021-04-22 DIAGNOSIS — E785 Hyperlipidemia, unspecified: Secondary | ICD-10-CM | POA: Diagnosis not present

## 2021-04-22 DIAGNOSIS — I10 Essential (primary) hypertension: Secondary | ICD-10-CM | POA: Insufficient documentation

## 2021-04-22 LAB — CBC WITH DIFFERENTIAL (CANCER CENTER ONLY)
Abs Immature Granulocytes: 0 10*3/uL (ref 0.00–0.07)
Basophils Absolute: 0 10*3/uL (ref 0.0–0.1)
Basophils Relative: 0 %
Eosinophils Absolute: 0.9 10*3/uL — ABNORMAL HIGH (ref 0.0–0.5)
Eosinophils Relative: 1 %
HCT: 33.4 % — ABNORMAL LOW (ref 39.0–52.0)
Hemoglobin: 10.7 g/dL — ABNORMAL LOW (ref 13.0–17.0)
Lymphocytes Relative: 85 %
Lymphs Abs: 77.9 10*3/uL — ABNORMAL HIGH (ref 0.7–4.0)
MCH: 31 pg (ref 26.0–34.0)
MCHC: 32 g/dL (ref 30.0–36.0)
MCV: 96.8 fL (ref 80.0–100.0)
Monocytes Absolute: 0.9 10*3/uL (ref 0.1–1.0)
Monocytes Relative: 1 %
Neutro Abs: 11.9 10*3/uL — ABNORMAL HIGH (ref 1.7–7.7)
Neutrophils Relative %: 13 %
Platelet Count: 142 10*3/uL — ABNORMAL LOW (ref 150–400)
RBC: 3.45 MIL/uL — ABNORMAL LOW (ref 4.22–5.81)
RDW: 14.6 % (ref 11.5–15.5)
WBC Count: 91.6 10*3/uL (ref 4.0–10.5)
nRBC: 0 % (ref 0.0–0.2)

## 2021-04-22 LAB — CMP (CANCER CENTER ONLY)
ALT: 18 U/L (ref 0–44)
AST: 19 U/L (ref 15–41)
Albumin: 4.1 g/dL (ref 3.5–5.0)
Alkaline Phosphatase: 86 U/L (ref 38–126)
Anion gap: 9 (ref 5–15)
BUN: 16 mg/dL (ref 8–23)
CO2: 23 mmol/L (ref 22–32)
Calcium: 9.3 mg/dL (ref 8.9–10.3)
Chloride: 110 mmol/L (ref 98–111)
Creatinine: 1.09 mg/dL (ref 0.61–1.24)
GFR, Estimated: >60 mL/min (ref >60)
Glucose, Bld: 121 mg/dL — ABNORMAL HIGH (ref 70–99)
Potassium: 4.2 mmol/L (ref 3.5–5.1)
Sodium: 142 mmol/L (ref 135–145)
Total Bilirubin: 0.6 mg/dL (ref 0.3–1.2)
Total Protein: 6.8 g/dL (ref 6.5–8.1)

## 2021-04-22 LAB — LACTATE DEHYDROGENASE: LDH: 183 U/L (ref 98–192)

## 2021-04-28 NOTE — Progress Notes (Signed)
HEMATOLOGY/ONCOLOGY CLINIC NOTE  Date of Service: 04/22/2021  Patient Care Team: Emeterio Reeve, DO as PCP - General (Osteopathic Medicine) [name], [facility] as Pharmacist (Pharmacist)  CHIEF COMPLAINTS/PURPOSE OF CONSULTATION:  Chronic Lymphocytic Leukemia   HISTORY OF PRESENTING ILLNESS:   Tony Banks is a wonderful 80 y.o. male who has been previously followed by my colleague Dr. 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 He is accompanied today by his wife. The pt reports that he is doing well overall. Verbal consent has been given by the pt for a clinical observer, Mathis Fare, to be present.    The pt was diagnosed with CLL in October 2018, following a Peripheral blood flow cytometry and cytogenetics report. 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 The pt notes that his CLL was picked up off of routine labs and was not prompted by any associated symptoms.   The pt reports that he has not had any symptoms or concerns in the last 3 months he denies any constitutional symptoms or noticing any new lumps or bumps.    Most recent lab results (03/22/18) of CBC w/diff, CMP is as follows: all values are WNL except for WBC at 18 initiating treatment of his CLL k, Lymphs abs at 11.9k, Glucose at 128. LDH 03/22/18 is normal at 160 QIG 03/22/18 is pending  On review of systems, pt reports good energy levels, and denies fevers, chills, night sweats, unexpected weight loss, noticing any new lumps or bumps, abdominal pains, and any other symptoms.   Interval History:   JAHAN FRIEDLANDER has been referred back for earlier follow-up for his CLL after recently being in the emergency room with weakness and leg pain on 04/01/2021.  Patient notes he is having some back pain and weakness in his legs primarily his thighs which have since improved and he is back to mowing his lawn. No fevers no chills no  night sweats.  Eating okay. Had a CT chest abdomen pelvis in the emergency room on 04/02/2021 which showed mild progression of his CLL with mildly progressed lymphadenopathy in his chest abdomen and pelvis and mild progression of his splenomegaly. And has no abdominal pain and no significant new bulky disease.  Labs today show his WBC count is down from 122k to 91.6k.  He has mild anemia with a hemoglobin of 10.7 and platelet count of 142k.  LDH remains within normal limits at 183.  Chemistries are stable.  We again discussed criteria for treatment of her CLL.  He does not have a strong indication to start CLL treatment nor is he keen to do so at this time.  MEDICAL HISTORY:  Past Medical History:  Diagnosis Date   Aortic atherosclerosis (HCC)    CLL (chronic lymphocytic leukemia) (HCC)    Constipation    severe   Diabetes mellitus without complication (HCC)    Diverticulosis    HLD (hyperlipidemia)    Hypertension    Nephrolithiasis     SURGICAL HISTORY: Past Surgical History:  Procedure Laterality Date   FINGER FRACTURE SURGERY Right    middle finger   SHOULDER ARTHROSCOPY Bilateral    VASECTOMY      SOCIAL HISTORY: Social History   Socioeconomic History   Marital status: Married    Spouse name: [place]   Number of children: 3   Years of education: 14   Highest education level: Some college, no degree  Occupational History   Occupation: retired  Tobacco Use   Smoking status: Former    Types: Cigarettes    Quit date: 07/31/1977    Years since quitting: 43.7   Smokeless tobacco: Never   Tobacco comments:    quit 30 yrs ago  Vaping Use   Vaping Use: Never used  Substance and Sexual Activity   Alcohol use: No   Drug use: No   Sexual activity: Never    Birth control/protection: Abstinence  Other Topics Concern   Not on file  Social History Narrative   Drinks 1-2 cups of coffee. Enjoys reading, watching tv and likes to ride his stationary bike.    Social  Determinants of Health   Financial Resource Strain: Low Risk    Difficulty of Paying Living Expenses: Not hard at all  Food Insecurity: No Food Insecurity   Worried About Charity fundraiser in the Last Year: Never true   [place] in the Last Year: Never true  Transportation Needs: No Transportation Needs   Lack of Transportation (Medical): No   Lack of Transportation (Non-Medical): No  Physical Activity: Sufficiently Active   Days of Exercise per Week: 7 days   Minutes of Exercise per Session: 80 min  Stress: No Stress Concern Present   Feeling of Stress : Not at all  Social Connections: Moderately Isolated   Frequency of Communication with Friends and Family: Once a week   Frequency of Social Gatherings with Friends and Family: Once a week   Attends Religious Services: More than 4 times per year   Active Member of [employer] or Organizations: No   Attends Music therapist: Never   Marital Status: Married  Human resources officer Violence: Not At Risk   Fear of Current or Ex-Partner: No   Emotionally Abused: No   Physically Abused: No   Sexually Abused: No    FAMILY HISTORY: Family History  Problem Relation Age of Onset   Heart disease Mother    Stroke Mother    Cancer Father    Stroke Maternal Grandfather    Colon cancer Neg Hx    Pancreatic cancer Neg Hx    Rectal cancer Neg Hx    Stomach cancer Neg Hx     ALLERGIES:  is allergic to shellfish allergy.  MEDICATIONS:  Current Outpatient Medications  Medication Sig Dispense Refill   ACCU-CHEK AVIVA PLUS test strip USE DAILY OR UP TO 4 TIMES PER DAY AS DIRECTED). USE AS INSTRUCTED 200 strip PRN   ACCU-CHEK SOFTCLIX LANCETS lancets Use as instructed 100 each 12   AMBULATORY NON FORMULARY MEDICATION Accu-chek aviva plus test strips and accu-chek softclix lancets, use to check blood sugar up to four times a day. Dx Type 2 diabetes. 100 Units 3   atorvastatin (LIPITOR) 10 MG tablet Take 1 tablet (10 mg total) by  mouth daily. 90 tablet 1   enalapril (VASOTEC) 5 MG tablet Take 1 tablet (5 mg total) by mouth daily. 90 tablet 1   HYDROcodone-acetaminophen (NORCO/VICODIN) 5-325 MG tablet Take 1 tablet by mouth every 6 (six) hours as needed. (Patient not taking: Reported on 04/12/2021) 16 tablet 0   metFORMIN (GLUCOPHAGE) 500 MG tablet TAKE 2 TABLETS BY MOUTH EVERY DAY WITH BREAKFAST (Patient taking differently: Take 1,000 mg by mouth daily with breakfast.) 180 tablet 3   Multiple Vitamin (MULTIVITAMIN WITH MINERALS) TABS tablet Take 1 tablet daily by mouth. (Patient not taking: Reported on 04/12/2021)     Omega-3 Fatty  Acids (FISH OIL PO) Take 1,200 mg by mouth in the morning, at noon, and at bedtime.     polyethylene glycol (MIRALAX) packet Take 17 g by mouth daily. (Patient taking differently: Take 17 g by mouth as needed.) 28 each 0   No current facility-administered medications for this visit.    REVIEW OF SYSTEMS:   10 Point review of Systems was done is negative except as noted above.  PHYSICAL EXAMINATION: ECOG PERFORMANCE STATUS: 0 - Asymptomatic  . Vitals:   04/22/21 1002  BP: 139/65  Pulse: 75  Resp: 16  Temp: 98.8 F (37.1 C)  SpO2: 100%    Filed Weights   04/22/21 1002  Weight: 152 lb 14.4 oz (69.4 kg)   .Body mass index is 23.25 kg/m.   Marland Kitchen GENERAL:alert, in no acute distress and comfortable SKIN: no acute rashes, no significant lesions EYES: conjunctiva are pink and non-injected, sclera anicteric OROPHARYNX: MMM, no exudates, no oropharyngeal erythema or ulceration NECK: supple, no JVD LYMPH:  no palpable lymphadenopathy in the cervical, axillary or inguinal regions LUNGS: clear to auscultation b/l with normal respiratory effort HEART: regular rate & rhythm ABDOMEN:  normoactive bowel sounds , non tender, not distended. Extremity: no pedal edema PSYCH: alert & oriented x 3 with fluent speech NEURO: no focal motor/sensory deficits   LABORATORY DATA:  I have reviewed the  data as listed  . CBC Latest Ref Rng & Units 04/22/2021 04/01/2021 03/22/2021  WBC 4.0 - 10.5 K/uL 91.6(HH) 121.9(HH) 96.4(HH)  Hemoglobin 13.0 - 17.0 g/dL 10.7(L) 11.7(L) 11.7(L)  Hematocrit 39.0 - 52.0 % 33.4(L) 35.9(L) 35.4(L)  Platelets 150 - 400 K/uL 142(L) 161 137(L)   . CBC    Component Value Date/Time   WBC 91.6 (HH) 04/22/2021 0954   WBC 121.9 (HH) 04/01/2021 1948   RBC 3.45 (L) 04/22/2021 0954   HGB 10.7 (L) 04/22/2021 0954   HGB 12.8 (L) 05/29/2017 1141   HCT 33.4 (L) 04/22/2021 0954   HCT 38.7 05/29/2017 1141   PLT 142 (L) 04/22/2021 0954   PLT 151 05/29/2017 1141   MCV 96.8 04/22/2021 0954   MCV 91.3 05/29/2017 1141   MCH 31.0 04/22/2021 0954   MCHC 32.0 04/22/2021 0954   RDW 14.6 04/22/2021 0954   RDW 13.8 05/29/2017 1141   LYMPHSABS 77.9 (H) 04/22/2021 0954   LYMPHSABS 8.8 (H) 05/29/2017 1141   MONOABS 0.9 04/22/2021 0954   MONOABS 0.7 05/29/2017 1141   EOSABS 0.9 (H) 04/22/2021 0954   EOSABS 0.1 05/29/2017 1141   BASOSABS 0.0 04/22/2021 0954   BASOSABS 0.0 05/29/2017 1141    . CMP Latest Ref Rng & Units 04/22/2021 04/01/2021 03/22/2021  Glucose 70 - 99 mg/dL 121(H) 154(H) 140(H)  BUN 8 - 23 mg/dL _0 Creatinine 0.61 - 1.24 mg/dL 1.09 1.15 1.22  Sodium 135 - 145 mmol/L 142 138 141  Potassium 3.5 - 5.1 mmol/L 4.2 5.1 4.3  Chloride 98 - 111 mmol/L 110 108 109  CO2 22 - 32 mmol/L _1 Calcium 8.9 - 10.3 mg/dL 9.3 9.6 9.4  Total Protein 6.5 - 8.1 g/dL 6.8 7.3 7.6  Total Bilirubin 0.3 - 1.2 mg/dL 0.6 0.7 0.9  Alkaline Phos 38 - 126 U/L 86 88 91  AST 15 - 41 U/L _2 ALT 0 - 44 U/L _3 06/08/17 FISH CLL Prognostic Panel:    06/08/17 Cytogenetics:   05/29/17 Peripheral blood flow cytometry:    RADIOGRAPHIC STUDIES:  I have personally reviewed the radiological images as listed and agreed with the findings in the report. DG Chest 2 View  Result Date: 04/01/2021 CLINICAL DATA:  Dyspnea with exertion. EXAM: CHEST - 2 VIEW  COMPARISON:  None. FINDINGS: The heart size and mediastinal contours are within normal limits. Both lungs are clear. The visualized skeletal structures are unremarkable. IMPRESSION: No active cardiopulmonary disease. Electronically Signed   By: Marijo Conception M.D.   On: 04/01/2021 20:37   MR THORACIC SPINE W WO CONTRAST  Result Date: 04/02/2021 CLINICAL DATA:  80 year old male with CLL. Increased fatigue, muscle weakness, near syncope, shortness of breath, lower extremity weakness. Query demyelinating disease. EXAM: MRI THORACIC WITHOUT AND WITH CONTRAST TECHNIQUE: Multiplanar and multiecho pulse sequences of the thoracic spine were obtained without and with intravenous contrast. CONTRAST:  74m GADAVIST GADOBUTROL 1 MMOL/ML IV SOLN COMPARISON:  CT Chest, Abdomen, and Pelvis today are reported separately. FINDINGS: Limited cervical spine imaging: Age-appropriate cervical spine degeneration but diffuse increased T1 marrow signal throughout the cervical spine. Thoracic spine segmentation:  Appears to be normal. Alignment:  Normal for age thoracic kyphosis. Vertebrae: Diffusely abnormal T1 marrow signal throughout the visible spine and posterior ribs. Low level generalized increased STIR signal throughout there is osseous structures. No destructive or discrete enhancing bone lesion. No vertebral body loss of height. Cord: Thoracic spinal cord signal is within normal limits throughout. Conus medullaris is at T12 and appears normal. No abnormal intradural enhancement or dural thickening. There is mild generalized thoracic epidural lipomatosis, most pronounced at T4-T5. Paraspinal and other soft tissues: Chest and abdominal findings are reported separately today. Small superficial sebaceous cyst left posterior upper back at the T1 level (series 23, image 3). Thoracic paraspinal soft tissues remain normal. Disc levels: Mild and/or age-appropriate thoracic spine degeneration with no discrete disc herniation. No  significant thoracic spinal stenosis despite some epidural lipomatosis as stated above. No thoracic foraminal stenosis. IMPRESSION: 1. Diffusely abnormal marrow signal in the visible spine and ribs, indeterminate for infiltration by leukemia/lymphoma versus widespread red marrow reactivation such as from anemia/chemotherapy. No destructive bone lesion or compression fracture. 2. Normal thoracic spinal cord. Some thoracic epidural lipomatosis, but no significant spinal stenosis with underlying mild for age thoracic spine degeneration. Electronically Signed   By: HGenevie AnnM.D.   On: 04/02/2021 07:27   MR Lumbar Spine W Wo Contrast  Result Date: 04/02/2021 CLINICAL DATA:  80year old male with CLL. Increased fatigue, muscle weakness, near syncope, shortness of breath, lower extremity weakness. Query demyelinating disease. EXAM: MRI LUMBAR SPINE WITHOUT AND WITH CONTRAST TECHNIQUE: Multiplanar and multiecho pulse sequences of the lumbar spine were obtained without and with intravenous contrast. CONTRAST:  736mGADAVIST GADOBUTROL 1 MMOL/ML IV SOLN COMPARISON:  CT Chest, Abdomen, and Pelvis and thoracic spine MRI today are reported separately. CT Chest, Abdomen, and Pelvis 11/15/2019. FINDINGS: Segmentation: Normal, concordant with the thoracic spine numbering today. Alignment: Straightening of lumbar lordosis. Stable vertebral height and alignment since March. Vertebrae: Diffusely decreased marrow signal in the visible spine and pelvis, with some areas of endplate sparing related to fatty chronic degenerative endplate marrow signal changes (series 3, image 8). No discrete destructive or enhancing marrow lesion identified. However, there is questionable early extension of abnormal soft tissue into the ventral sacral epidural space at the S1-S2 junction (series 3, image 8). This is not definite, and elsewhere the lumbar epidural space appears normal. Conus medullaris and cauda equina: Conus extends to the T12 level. No  lower spinal  cord or conus signal abnormality. Cauda equina nerve roots appear normal. No nerve root thickening. No abnormal intradural enhancement (presumed physiologic linear vascular enhancement related to the continuation of the anterior spinal artery on series 6, image 9). No dural thickening or enhancement. Paraspinal and other soft tissues: Abdominal and pelvic viscera are reported separately. Lumbar paraspinal soft tissues remain within normal limits. Disc levels: Chronic lumbar disc and endplate degeneration K8-L2 through L4-L5. Disc bulging and endplate spurring. Mild facet hypertrophy. There is multifactorial mild spinal stenosis at L3-L4, with mild to moderate bilateral L3 neural foraminal stenosis. No other significant stenosis. IMPRESSION: 1. Diffusely abnormal marrow signal throughout the visible spine and pelvis, as seen on the Thoracic MRI. This is indeterminate for infiltration by leukemia/lymphoma versus widespread red marrow reactivation - and there is questionable abnormal extension of soft tissue into the ventral sacral epidural space at the S1-S2. But no destructive bone lesion or compression fracture. 2. Normal conus medullaris and cauda equina nerve roots, except for mild spinal stenosis and mild to moderate neural foraminal stenosis due to degeneration at L3-L4. 3. See also CT Chest, Abdomen, and Pelvis today reported separately. Electronically Signed   By: Genevie Ann M.D.   On: 04/02/2021 07:36   CT CHEST ABDOMEN PELVIS W CONTRAST  Result Date: 04/02/2021 CLINICAL DATA:  80 year old male with CLL. Increased fatigue, muscle weakness, near syncope, shortness of breath, lower extremity weakness. Query demyelinating disease. EXAM: CT CHEST, ABDOMEN, AND PELVIS WITH CONTRAST TECHNIQUE: Multidetector CT imaging of the chest, abdomen and pelvis was performed following the standard protocol during bolus administration of intravenous contrast. CONTRAST:  119m OMNIPAQUE IOHEXOL 300 MG/ML  SOLN  COMPARISON:  Restaging CT Chest, Abdomen, and Pelvis 11/15/2019. Thoracic and lumbar MRI today reported separately. FINDINGS: CT CHEST FINDINGS Cardiovascular: Calcified aortic atherosclerosis. Calcified coronary artery atherosclerosis. Cardiac size appears mildly enlarged now. No pericardial effusion. Visible central pulmonary arteries appear patent. Mediastinum/Nodes: Bilateral axillary lymphadenopathy, not significantly changed since March. Mediastinal lymph nodes are small but increased in number, stable since March. Stable similar mildly increased bilateral hilar lymph nodes. Lungs/Pleura: Major airways are patent. Lower lung volumes today. Increased dependent atelectasis in both lungs superimposed on some centrilobular and paraseptal emphysema. Calcified right upper lobe granuloma abutting the mediastinum is benign. Tiny additional calcified granuloma on image 68. No suspicious pulmonary nodule or opacity. Musculoskeletal: Thoracic spine detailed separately. Otherwise No acute osseous abnormality identified. In the chest. CT ABDOMEN PELVIS FINDINGS Hepatobiliary: Hepatic steatosis is more apparent. No discrete liver lesion. Negative gallbladder. Pancreas: Negative. Spleen: Splenic volume estimated at 900 mL (normal splenic volume range 83 - 412 mL). And appears mildly larger since March. No discrete splenic lesion. Adrenals/Urinary Tract: Normal adrenal glands. Kidneys, ureters, and bladder appear negative with some excreted IV contrast present. Stomach/Bowel: Diverticulosis of the sigmoid colon without active inflammation. Occasional diverticula in the transverse colon. Mild redundancy and low-density retained stool throughout the large bowel. Normal appendix on series 3, image 91. No dilated small bowel. Negative terminal ileum. Unremarkable stomach and duodenum. No free air, free fluid. Vascular/Lymphatic: Extensive Aortoiliac calcified atherosclerosis. Major arterial structures remain patent. Portal venous  system is patent. Upper abdominal, porta hepatis lymphadenopathy appears mildly progressed since March with lymph node short axis size now up to 2 cm (series 3, image 57) versus 17 mm previously. Smaller mostly left side retroperitoneal lymph nodes also appear mildly increased. No lymphadenopathy along the pelvic sidewalls. But mild inguinal lymphadenopathy also appears increased. Reproductive: Negative. Other: No pelvic free fluid.  Musculoskeletal: Lumbar spine detailed separately. Otherwise No acute osseous abnormality identified. In the pelvis. IMPRESSION: 1. Findings compatible with mild progression of leukemia/lymphoma since March: Mildly progressed lymphadenopathy in the abdomen, and Splenomegaly. Mild, progressed inguinal lymphadenopathy. Stable bilateral axillary lymphadenopathy, borderline mediastinal and hilar lymphadenopathy. 2. Mild cardiomegaly now versus accentuated cardiac size from lower lung volumes. Calcified coronary artery and Aortic Atherosclerosis (ICD10-I70.0). 3.  Emphysema (ICD10-J43.9).  Mild atelectasis. 4. Hepatic steatosis. Diverticulosis of the colon without active inflammation. 5. Thoracic and Lumbar Spine detailed by MRI separately. Electronically Signed   By: Genevie Ann M.D.   On: 04/02/2021 07:20    ASSESSMENT & PLAN:   80 y.o. male with  1. Chronic Lymphocytic Leukemia Rai 1  06/08/17 CLL FISH Prognostic panel negative for del17p, del11q, del13q, indicating intermediate risk 11/15/2019 CT C/A/P ([id number]) revealed gradual lymph node increase compared to 2018 CT. No major lymphoma progression. Mildly enlarged prostate.    PLAN: -Discussed pt labwork today, 04/22/2021; WBC improved, Plt and Hgb stable, chemistries normal.  -Discussed treatment CT chest abdomen pelvis done in the emergency room-minimal progression of his lymphadenopathy and splenomegaly.  No bulky disease. -Discussed that we shall monitor him closely but that he does not have any compelling reasons to start  CLL treatment at this time in the absence of constitutional symptoms significant cytopenias or bulky disease or symptomatic splenomegaly. -Advised pt that his current symptoms sounds more like spinal stenosis and radiculopathy than generalized fatigue. Recommended pt follow up with his PCP for further evaluation. -He did get MRI thoracic and lumbar -he should review this with his primary care physician and consider orthopedic referral. - would also recommend he follow-up with PCP to consider US Arterial with ABI to rule out arterial insuffiencey.  -No lab radiographic or clinical evidence of significant CLL progression at this time to warrant immediately starting CLL treatment at this time. -Recommended pt call PCP ASAP regarding his new urinary and leg symptoms.   FOLLOW UP: Return to clinic with Dr. Irene Limbo as per schedule appointments on 07/19/2021 F/u with PCP to evaluate lower extremity and prostate symptoms     The total time spent in the appointment was 20 minutes and more than 50% was on counseling and direct patient cares.  All of the patient's questions were answered with apparent satisfaction. The patient knows to call the clinic with any problems, questions or concerns.    Sullivan Lone MD [place] AAHIVMS [facility] Hematology/Oncology Physician [facility]  (Office):       [phone] (Work cell):  [phone] (Fax):           [phone]

## 2021-04-30 DIAGNOSIS — H5203 Hypermetropia, bilateral: Secondary | ICD-10-CM | POA: Diagnosis not present

## 2021-04-30 DIAGNOSIS — E119 Type 2 diabetes mellitus without complications: Secondary | ICD-10-CM | POA: Diagnosis not present

## 2021-04-30 DIAGNOSIS — H43813 Vitreous degeneration, bilateral: Secondary | ICD-10-CM | POA: Diagnosis not present

## 2021-04-30 DIAGNOSIS — H52203 Unspecified astigmatism, bilateral: Secondary | ICD-10-CM | POA: Diagnosis not present

## 2021-05-20 ENCOUNTER — Ambulatory Visit: Payer: Medicare HMO | Admitting: Osteopathic Medicine

## 2021-06-07 DIAGNOSIS — I10 Essential (primary) hypertension: Secondary | ICD-10-CM | POA: Diagnosis not present

## 2021-06-07 DIAGNOSIS — E782 Mixed hyperlipidemia: Secondary | ICD-10-CM | POA: Diagnosis not present

## 2021-06-07 DIAGNOSIS — E1165 Type 2 diabetes mellitus with hyperglycemia: Secondary | ICD-10-CM | POA: Diagnosis not present

## 2021-06-15 DIAGNOSIS — I10 Essential (primary) hypertension: Secondary | ICD-10-CM | POA: Diagnosis not present

## 2021-06-15 DIAGNOSIS — E782 Mixed hyperlipidemia: Secondary | ICD-10-CM | POA: Diagnosis not present

## 2021-06-15 DIAGNOSIS — E1165 Type 2 diabetes mellitus with hyperglycemia: Secondary | ICD-10-CM | POA: Diagnosis not present

## 2021-07-01 ENCOUNTER — Ambulatory Visit: Payer: Medicare HMO | Admitting: Family Medicine

## 2021-07-16 ENCOUNTER — Other Ambulatory Visit: Payer: Self-pay

## 2021-07-16 DIAGNOSIS — C9110 Chronic lymphocytic leukemia of B-cell type not having achieved remission: Secondary | ICD-10-CM

## 2021-07-19 ENCOUNTER — Inpatient Hospital Stay: Payer: Medicare HMO | Attending: Hematology

## 2021-07-19 ENCOUNTER — Other Ambulatory Visit: Payer: Self-pay

## 2021-07-19 ENCOUNTER — Inpatient Hospital Stay: Payer: Medicare HMO | Admitting: Hematology

## 2021-07-19 VITALS — BP 137/63 | HR 84 | Temp 97.3°F | Resp 18 | Wt 152.4 lb

## 2021-07-19 DIAGNOSIS — E785 Hyperlipidemia, unspecified: Secondary | ICD-10-CM | POA: Diagnosis not present

## 2021-07-19 DIAGNOSIS — C9110 Chronic lymphocytic leukemia of B-cell type not having achieved remission: Secondary | ICD-10-CM

## 2021-07-19 DIAGNOSIS — I10 Essential (primary) hypertension: Secondary | ICD-10-CM | POA: Diagnosis not present

## 2021-07-19 DIAGNOSIS — E119 Type 2 diabetes mellitus without complications: Secondary | ICD-10-CM | POA: Diagnosis not present

## 2021-07-19 DIAGNOSIS — Z87442 Personal history of urinary calculi: Secondary | ICD-10-CM | POA: Insufficient documentation

## 2021-07-19 DIAGNOSIS — I700 Atherosclerosis of aorta: Secondary | ICD-10-CM | POA: Insufficient documentation

## 2021-07-19 DIAGNOSIS — K5900 Constipation, unspecified: Secondary | ICD-10-CM | POA: Insufficient documentation

## 2021-07-19 DIAGNOSIS — L989 Disorder of the skin and subcutaneous tissue, unspecified: Secondary | ICD-10-CM

## 2021-07-19 LAB — CBC WITH DIFFERENTIAL (CANCER CENTER ONLY)
Abs Immature Granulocytes: 0.55 10*3/uL — ABNORMAL HIGH (ref 0.00–0.07)
Basophils Absolute: 0.1 10*3/uL (ref 0.0–0.1)
Basophils Relative: 0 %
Eosinophils Absolute: 0.3 10*3/uL (ref 0.0–0.5)
Eosinophils Relative: 0 %
HCT: 33.9 % — ABNORMAL LOW (ref 39.0–52.0)
Hemoglobin: 10.9 g/dL — ABNORMAL LOW (ref 13.0–17.0)
Immature Granulocytes: 1 %
Lymphocytes Relative: 87 %
Lymphs Abs: 96.2 10*3/uL — ABNORMAL HIGH (ref 0.7–4.0)
MCH: 31.6 pg (ref 26.0–34.0)
MCHC: 32.2 g/dL (ref 30.0–36.0)
MCV: 98.3 fL (ref 80.0–100.0)
Monocytes Absolute: 6.6 10*3/uL — ABNORMAL HIGH (ref 0.1–1.0)
Monocytes Relative: 6 %
Neutro Abs: 6.5 10*3/uL (ref 1.7–7.7)
Neutrophils Relative %: 6 %
Platelet Count: 138 10*3/uL — ABNORMAL LOW (ref 150–400)
RBC: 3.45 MIL/uL — ABNORMAL LOW (ref 4.22–5.81)
RDW: 14.6 % (ref 11.5–15.5)
Smear Review: NORMAL
WBC Count: 110.3 10*3/uL (ref 4.0–10.5)
nRBC: 0 % (ref 0.0–0.2)

## 2021-07-19 LAB — CMP (CANCER CENTER ONLY)
ALT: 18 U/L (ref 0–44)
AST: 22 U/L (ref 15–41)
Albumin: 4.2 g/dL (ref 3.5–5.0)
Alkaline Phosphatase: 96 U/L (ref 38–126)
Anion gap: 10 (ref 5–15)
BUN: 21 mg/dL (ref 8–23)
CO2: 24 mmol/L (ref 22–32)
Calcium: 9.5 mg/dL (ref 8.9–10.3)
Chloride: 109 mmol/L (ref 98–111)
Creatinine: 1.18 mg/dL (ref 0.61–1.24)
GFR, Estimated: >60 mL/min (ref >60)
Glucose, Bld: 132 mg/dL — ABNORMAL HIGH (ref 70–99)
Potassium: 4.3 mmol/L (ref 3.5–5.1)
Sodium: 143 mmol/L (ref 135–145)
Total Bilirubin: 0.7 mg/dL (ref 0.3–1.2)
Total Protein: 7.1 g/dL (ref 6.5–8.1)

## 2021-07-19 LAB — LACTATE DEHYDROGENASE: LDH: 156 U/L (ref 98–192)

## 2021-07-21 ENCOUNTER — Other Ambulatory Visit: Payer: Self-pay

## 2021-07-21 ENCOUNTER — Telehealth: Payer: Self-pay | Admitting: Dermatology

## 2021-07-21 DIAGNOSIS — C9110 Chronic lymphocytic leukemia of B-cell type not having achieved remission: Secondary | ICD-10-CM

## 2021-07-21 NOTE — Telephone Encounter (Signed)
Please contact Dr Grier Mitts office and see if they can get him in elsewhere before May

## 2021-07-21 NOTE — Progress Notes (Signed)
l °

## 2021-07-21 NOTE — Telephone Encounter (Signed)
Notes documented and referral routed back to referring office. 

## 2021-07-27 NOTE — Progress Notes (Signed)
HEMATOLOGY/ONCOLOGY CLINIC NOTE  Date of Service: .07/19/2021   Patient Care Team: Tony Mccreedy, MD as PCP - General (Internal Medicine) Tony Banks, [facility] as Pharmacist (Pharmacist)  CHIEF COMPLAINTS/PURPOSE OF CONSULTATION:  Chronic Lymphocytic Leukemia   HISTORY OF PRESENTING ILLNESS:   Tony Banks is a wonderful 80 y.o. male who has been previously followed by my colleague Dr. 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 He is accompanied today by his wife. The pt reports that he is doing well overall. Verbal consent has been given by the pt for a clinical observer, Mathis Fare, to be present.    The pt was diagnosed with CLL in October 2018, following a Peripheral blood flow cytometry and cytogenetics report. 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 The pt notes that his CLL was picked up off of routine labs and was not prompted by any associated symptoms.   The pt reports that he has not had any symptoms or concerns in the last 3 months he denies any constitutional symptoms or noticing any new lumps or bumps.    Most recent lab results (03/22/18) of CBC w/diff, CMP is as follows: all values are WNL except for WBC at 18 initiating treatment of his CLL k, Lymphs abs at 11.9k, Glucose at 128. LDH 03/22/18 is normal at 160 QIG 03/22/18 is pending  On review of systems, pt reports good energy levels, and denies fevers, chills, night sweats, unexpected weight loss, noticing any new lumps or bumps, abdominal pains, and any other symptoms.   Interval History:   Tony Banks is here for follow-up of his CLL after his last clinic visit with [place] on 04/22/2021.  Patient notes that he has been doing well and has no acute new concerns.  Has had some chronic back pain issues but these are reasonably controlled. No fevers no chills no night sweats no unexpected weight loss. Patient reports he  has been eating well. No new or progressively enlarging lumps noted.  Labs today show his WBC count is 110k with 96.2k lymphocytes, mild anemia with a hemoglobin of 10.9 which is unchanged, and platelet count of 138k.   LDH remains within normal limits at 156.   Chemistries are stable.   MEDICAL HISTORY:  Past Medical History:  Diagnosis Date   Aortic atherosclerosis (HCC)    CLL (chronic lymphocytic leukemia) (HCC)    Constipation    severe   Diabetes mellitus without complication (HCC)    Diverticulosis    HLD (hyperlipidemia)    Hypertension    Nephrolithiasis     SURGICAL HISTORY: Past Surgical History:  Procedure Laterality Date   FINGER FRACTURE SURGERY Right    middle finger   SHOULDER ARTHROSCOPY Bilateral    VASECTOMY      SOCIAL HISTORY: Social History   Socioeconomic History   Marital status: Married    Spouse name: [place]   Number of children: 3   Years of education: 14   Highest education level: Some college, no degree  Occupational History   Occupation: retired  Tobacco Use   Smoking status: Former    Types: Cigarettes    Quit date: 07/31/1977    Years since quitting: 44.0   Smokeless tobacco: Never   Tobacco comments:    quit 30 yrs ago  Vaping Use   Vaping Use: Never used  Substance and Sexual Activity   Alcohol use: No  Drug use: No   Sexual activity: Never    Birth control/protection: Abstinence  Other Topics Concern   Not on file  Social History Narrative   Drinks 1-2 cups of coffee. Enjoys reading, watching tv and likes to ride his stationary bike.    Social Determinants of Health   Financial Resource Strain: Low Risk    Difficulty of Paying Living Expenses: Not hard at all  Food Insecurity: No Food Insecurity   Worried About Charity fundraiser in the Last Year: Never true   [place] in the Last Year: Never true  Transportation Needs: No Transportation Needs   Lack of Transportation (Medical): No   Lack of  Transportation (Non-Medical): No  Physical Activity: Sufficiently Active   Days of Exercise per Week: 7 days   Minutes of Exercise per Session: 80 min  Stress: No Stress Concern Present   Feeling of Stress : Not at all  Social Connections: Moderately Isolated   Frequency of Communication with Friends and Family: Once a week   Frequency of Social Gatherings with Friends and Family: Once a week   Attends Religious Services: More than 4 times per year   Active Member of [employer] or Organizations: No   Attends Music therapist: Never   Marital Status: Married  Human resources officer Violence: Not At Risk   Fear of Current or Ex-Partner: No   Emotionally Abused: No   Physically Abused: No   Sexually Abused: No    FAMILY HISTORY: Family History  Problem Relation Age of Onset   Heart disease Mother    Stroke Mother    Cancer Father    Stroke Maternal Grandfather    Colon cancer Neg Hx    Pancreatic cancer Neg Hx    Rectal cancer Neg Hx    Stomach cancer Neg Hx     ALLERGIES:  is allergic to shellfish allergy.  MEDICATIONS:  Current Outpatient Medications  Medication Sig Dispense Refill   ACCU-CHEK AVIVA PLUS test strip USE DAILY OR UP TO 4 TIMES PER DAY AS DIRECTED). USE AS INSTRUCTED 200 strip PRN   ACCU-CHEK SOFTCLIX LANCETS lancets Use as instructed 100 each 12   AMBULATORY NON FORMULARY MEDICATION Accu-chek aviva plus test strips and accu-chek softclix lancets, use to check blood sugar up to four times a day. Dx Type 2 diabetes. 100 Units 3   atorvastatin (LIPITOR) 10 MG tablet Take 1 tablet (10 mg total) by mouth daily. 90 tablet 1   enalapril (VASOTEC) 5 MG tablet Take 1 tablet (5 mg total) by mouth daily. 90 tablet 1   HYDROcodone-acetaminophen (NORCO/VICODIN) 5-325 MG tablet Take 1 tablet by mouth every 6 (six) hours as needed. (Patient not taking: Reported on 04/12/2021) 16 tablet 0   metFORMIN (GLUCOPHAGE) 500 MG tablet TAKE 2 TABLETS BY MOUTH EVERY DAY WITH BREAKFAST  (Patient taking differently: Take 1,000 mg by mouth daily with breakfast.) 180 tablet 3   Multiple Vitamin (MULTIVITAMIN WITH MINERALS) TABS tablet Take 1 tablet daily by mouth. (Patient not taking: Reported on 04/12/2021)     Omega-3 Fatty Acids (FISH OIL PO) Take 1,200 mg by mouth in the morning, at noon, and at bedtime.     polyethylene glycol (MIRALAX) packet Take 17 g by mouth daily. (Patient taking differently: Take 17 g by mouth as needed.) 28 each 0   No current facility-administered medications for this visit.    REVIEW OF SYSTEMS:   .10 Point review of Systems was done is  negative except as noted above.   PHYSICAL EXAMINATION: ECOG PERFORMANCE STATUS: 0 - Asymptomatic  . Vitals:   07/19/21 1410  BP: 137/63  Pulse: 84  Resp: 18  Temp: (!) 97.3 F (36.3 C)  SpO2: 99%    Filed Weights   07/19/21 1410  Weight: 152 lb 6.4 oz (69.1 kg)   .Body mass index is 23.17 kg/m.   Marland Kitchen GENERAL:alert, in no acute distress and comfortable SKIN: no acute rashes, no significant lesions EYES: conjunctiva are pink and non-injected, sclera anicteric OROPHARYNX: MMM, no exudates, no oropharyngeal erythema or ulceration NECK: supple, no JVD LYMPH:  no palpable lymphadenopathy in the cervical, axillary or inguinal regions LUNGS: clear to auscultation b/l with normal respiratory effort HEART: regular rate & rhythm ABDOMEN:  normoactive bowel sounds , non tender, not distended. Extremity: no pedal edema PSYCH: alert & oriented x 3 with fluent speech NEURO: no focal motor/sensory deficits    LABORATORY DATA:  I have reviewed the data as listed  . CBC Latest Ref Rng & Units 07/19/2021 04/22/2021 04/01/2021  WBC 4.0 - 10.5 K/uL 110.3(HH) 91.6(HH) 121.9(HH)  Hemoglobin 13.0 - 17.0 g/dL 10.9(L) 10.7(L) 11.7(L)  Hematocrit 39.0 - 52.0 % 33.9(L) 33.4(L) 35.9(L)  Platelets 150 - 400 K/uL 138(L) 142(L) 161   . CBC    Component Value Date/Time   WBC 110.3 (HH) 07/19/2021 1348   WBC 121.9  (HH) 04/01/2021 1948   RBC 3.45 (L) 07/19/2021 1348   HGB 10.9 (L) 07/19/2021 1348   HGB 12.8 (L) 05/29/2017 1141   HCT 33.9 (L) 07/19/2021 1348   HCT 38.7 05/29/2017 1141   PLT 138 (L) 07/19/2021 1348   PLT 151 05/29/2017 1141   MCV 98.3 07/19/2021 1348   MCV 91.3 05/29/2017 1141   MCH 31.6 07/19/2021 1348   MCHC 32.2 07/19/2021 1348   RDW 14.6 07/19/2021 1348   RDW 13.8 05/29/2017 1141   LYMPHSABS 96.2 (H) 07/19/2021 1348   LYMPHSABS 8.8 (H) 05/29/2017 1141   MONOABS 6.6 (H) 07/19/2021 1348   MONOABS 0.7 05/29/2017 1141   EOSABS 0.3 07/19/2021 1348   EOSABS 0.1 05/29/2017 1141   BASOSABS 0.1 07/19/2021 1348   BASOSABS 0.0 05/29/2017 1141    . CMP Latest Ref Rng & Units 07/19/2021 04/22/2021 04/01/2021  Glucose 70 - 99 mg/dL 132(H) 121(H) 154(H)  BUN 8 - 23 mg/dL $Remove'21 16 18  'WjCHyAX$ Creatinine 0.61 - 1.24 mg/dL 1.18 1.09 1.15  Sodium 135 - 145 mmol/L 143 142 138  Potassium 3.5 - 5.1 mmol/L 4.3 4.2 5.1  Chloride 98 - 111 mmol/L 109 110 108  CO2 22 - 32 mmol/L $RemoveB'24 23 22  'HLajqBQC$ Calcium 8.9 - 10.3 mg/dL 9.5 9.3 9.6  Total Protein 6.5 - 8.1 g/dL 7.1 6.8 7.3  Total Bilirubin 0.3 - 1.2 mg/dL 0.7 0.6 0.7  Alkaline Phos 38 - 126 U/L 96 86 88  AST 15 - 41 U/L $Remo'22 19 28  'HtrqX$ ALT 0 - 44 U/L $Remo'18 18 20   'qzQTX$ 06/08/17 FISH CLL Prognostic Panel:    06/08/17 Cytogenetics:   05/29/17 Peripheral blood flow cytometry:    RADIOGRAPHIC STUDIES: I have personally reviewed the radiological images as listed and agreed with the findings in the report. No results found.  ASSESSMENT & PLAN:   80 y.o. male with  1. Chronic Lymphocytic Leukemia Rai 1  06/08/17 CLL FISH Prognostic panel negative for del17p, del11q, del13q, indicating intermediate risk 11/15/2019 CT C/A/P ([id number]) revealed gradual lymph node increase compared to 2018 CT. No major  lymphoma progression. Mildly enlarged prostate.    PLAN: -Discussed pt labwork today,  Labs today show his WBC count is 110k with 96.2k lymphocytes, mild anemia with  a hemoglobin of 10.9 which is unchanged, and platelet count of 138k.   LDH remains within normal limits at 156.   Chemistries are stable. -Discussed that we shall monitor him closely but that he does not have any compelling reasons to start CLL treatment at this time in the absence of constitutional symptoms significant cytopenias or bulky disease or symptomatic splenomegaly. --No lab or clinical evidence of significant CLL progression at this time to warrant immediately starting CLL treatment at this time.  2.  Crusty somewhat ulcerated lesion on his forehead rule out squamous cell carcinoma -Referral to [facility] dermatology  FOLLOW UP: -Referral to [facility] dermatology for evaluation for skin lesions on the forehead concerning for squamous cell carcinoma in a patient with CLL -Return to Clinic with Dr. Irene Limbo with Labs in 4 months    . The total time spent in the appointment was 25 minutes and more than 50% was on counseling and direct patient cares.   All of the patient's questions were answered with apparent satisfaction. The patient knows to call the clinic with any problems, questions or concerns.    Sullivan Lone MD [place] AAHIVMS [facility] Hematology/Oncology Physician [facility]

## 2021-08-03 DIAGNOSIS — I10 Essential (primary) hypertension: Secondary | ICD-10-CM | POA: Diagnosis not present

## 2021-08-03 DIAGNOSIS — E1165 Type 2 diabetes mellitus with hyperglycemia: Secondary | ICD-10-CM | POA: Diagnosis not present

## 2021-08-03 DIAGNOSIS — E782 Mixed hyperlipidemia: Secondary | ICD-10-CM | POA: Diagnosis not present

## 2021-08-12 DIAGNOSIS — Z789 Other specified health status: Secondary | ICD-10-CM | POA: Diagnosis not present

## 2021-08-12 DIAGNOSIS — E782 Mixed hyperlipidemia: Secondary | ICD-10-CM | POA: Diagnosis not present

## 2021-08-12 DIAGNOSIS — I10 Essential (primary) hypertension: Secondary | ICD-10-CM | POA: Diagnosis not present

## 2021-08-12 DIAGNOSIS — E1165 Type 2 diabetes mellitus with hyperglycemia: Secondary | ICD-10-CM | POA: Diagnosis not present

## 2021-08-16 ENCOUNTER — Ambulatory Visit: Payer: Medicare HMO | Admitting: Osteopathic Medicine

## 2021-08-25 DIAGNOSIS — D485 Neoplasm of uncertain behavior of skin: Secondary | ICD-10-CM | POA: Diagnosis not present

## 2021-08-25 DIAGNOSIS — L570 Actinic keratosis: Secondary | ICD-10-CM | POA: Diagnosis not present

## 2021-08-25 DIAGNOSIS — C4442 Squamous cell carcinoma of skin of scalp and neck: Secondary | ICD-10-CM | POA: Diagnosis not present

## 2021-09-29 DIAGNOSIS — D485 Neoplasm of uncertain behavior of skin: Secondary | ICD-10-CM | POA: Diagnosis not present

## 2021-09-29 DIAGNOSIS — C44329 Squamous cell carcinoma of skin of other parts of face: Secondary | ICD-10-CM | POA: Diagnosis not present

## 2021-10-11 ENCOUNTER — Telehealth: Payer: Medicare HMO

## 2021-10-22 DIAGNOSIS — L989 Disorder of the skin and subcutaneous tissue, unspecified: Secondary | ICD-10-CM | POA: Diagnosis not present

## 2021-10-22 DIAGNOSIS — C44329 Squamous cell carcinoma of skin of other parts of face: Secondary | ICD-10-CM | POA: Diagnosis not present

## 2021-10-22 DIAGNOSIS — C4492 Squamous cell carcinoma of skin, unspecified: Secondary | ICD-10-CM | POA: Diagnosis not present

## 2021-10-22 DIAGNOSIS — D485 Neoplasm of uncertain behavior of skin: Secondary | ICD-10-CM | POA: Diagnosis not present

## 2021-10-22 DIAGNOSIS — Z481 Encounter for planned postprocedural wound closure: Secondary | ICD-10-CM | POA: Diagnosis not present

## 2021-11-05 DIAGNOSIS — B079 Viral wart, unspecified: Secondary | ICD-10-CM | POA: Diagnosis not present

## 2021-11-05 DIAGNOSIS — D485 Neoplasm of uncertain behavior of skin: Secondary | ICD-10-CM | POA: Diagnosis not present

## 2021-11-05 DIAGNOSIS — C44229 Squamous cell carcinoma of skin of left ear and external auricular canal: Secondary | ICD-10-CM | POA: Diagnosis not present

## 2021-11-08 NOTE — Progress Notes (Signed)
Histology and Location of Primary Skin Cancer:  ? ? ?Tony Banks presented with the following signs/symptoms: (from Dr. Hubbard Hartshorn note) ? ? ?Past/Anticipated interventions by patient's surgeon/dermatologist for current problematic lesion, if any:  ?10/22/2021 ?--Dr. [name] ?Mohs surgery  ? ?Past skin cancers, if any: ? ? 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 ?SAFETY ISSUES: ?Prior radiation? No ?Pacemaker/ICD? No ?Possible current pregnancy? N/A ?Is the patient on methotrexate? No ? ?Current Complaints / other details: Under care of medical oncologist Dr. Irene Limbo for CLL management. Scheduled for lab and F/U with Dr. Irene Limbo next week  ? ?

## 2021-11-08 NOTE — Progress Notes (Signed)
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 Cancer of skin of scalp  C44.40     3. 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 Irene Limbo)  CHIEF COMPLAINT: Here to discuss management of skin cancer  HISTORY OF PRESENT ILLNESS::Tony Banks is a 81 y.o. male who presents today for consideration of radiation therapy in management of his newly diagnosed skin cancer. The patient has been followed for quite some time by Dr. Irene Limbo for his history of CLL. During a follow-up visit with Dr. Irene Limbo on 07/19/21, the patient was incidentally found to have a crusty somewhat ulcerated lesion on his forehead. Given the concerning appearance of the lesion, Dr. Irene Limbo referred the patient to [facility] dermatology for further evaluation.   Accordingly, the patient was referred to Dr. Winifred Olive who performed a biopsy of the left frontal scalp lesion on 08/25/21 which revealed squamous cell carcinoma  Subsequently, the patient underwent Mohs procedure of the left-frontal scalp lesion on 10/22/21 which revealed invasive squamous cell carcinoma with PNI (largest nerve diameter measuring up to 0.3 mm).   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 No Pacemaker/ICD? No Possible current pregnancy? N/A Is the patient on methotrexate? No  Current Complaints / other details: Under care of medical oncologist Dr. Irene Limbo  for CLL management. Scheduled for lab and F/U with Dr. Irene Limbo next week   PREVIOUS RADIATION THERAPY: No  PAST MEDICAL HISTORY:  has a past medical history of Aortic atherosclerosis ([place]), CLL (chronic lymphocytic leukemia) ([place]), Constipation, Diabetes mellitus without complication ([place]), Diverticulosis, HLD (hyperlipidemia), Hypertension, and Nephrolithiasis.    PAST SURGICAL HISTORY: Past Surgical History:  Procedure Laterality Date   FINGER FRACTURE SURGERY Right    middle finger   SHOULDER ARTHROSCOPY Bilateral    VASECTOMY      FAMILY HISTORY: family history includes Cancer in his father; Heart disease in his mother; Stroke in his maternal grandfather and mother.  SOCIAL HISTORY:  reports that he quit smoking about 44 years ago. His smoking use included cigarettes. He has never used smokeless tobacco. He reports that he does not drink alcohol and does not use drugs.  ALLERGIES: Shellfish allergy  MEDICATIONS:  Current Outpatient Medications  Medication Sig Dispense Refill   atorvastatin (LIPITOR) 10 MG tablet Take 1 tablet (10 mg total) by mouth daily. 90 tablet 1   enalapril (VASOTEC) 5 MG tablet Take 1 tablet (5 mg total) by mouth daily. 90 tablet 1   metFORMIN (GLUCOPHAGE) 500 MG tablet TAKE 2 TABLETS BY MOUTH EVERY DAY WITH BREAKFAST (Patient taking differently: Take 1,000 mg by mouth daily with breakfast.) 180 tablet 3   Multiple Vitamin (MULTIVITAMIN WITH MINERALS) TABS tablet Take 1 tablet by mouth daily.     Omega-3 Fatty Acids (FISH OIL PO) Take 1,200 mg by mouth in the morning, at noon, and at bedtime.  ACCU-CHEK AVIVA PLUS test strip USE DAILY OR UP TO 4 TIMES PER DAY AS DIRECTED). USE AS INSTRUCTED 200 strip PRN   ACCU-CHEK SOFTCLIX LANCETS lancets Use as instructed 100 each 12   AMBULATORY NON FORMULARY MEDICATION Accu-chek aviva plus test strips and accu-chek softclix lancets, use to check blood sugar up to four times a day. Dx Type 2 diabetes. 100 Units 3    HYDROcodone-acetaminophen (NORCO/VICODIN) 5-325 MG tablet Take 1 tablet by mouth every 6 (six) hours as needed. (Patient not taking: Reported on 04/12/2021) 16 tablet 0   polyethylene glycol (MIRALAX) packet Take 17 g by mouth daily. (Patient taking differently: Take 17 g by mouth as needed.) 28 each 0   No current facility-administered medications for this encounter.    REVIEW OF SYSTEMS:  Notable for that above.   PHYSICAL EXAM:  height is '5\' 8"'$  (1.727 m) and weight is 152 lb 6 oz (69.1 kg). His temporal temperature is 97.3 F (36.3 C) (abnormal). His blood pressure is 145/61 (abnormal) and his pulse is 82. His respiration is 18 and oxygen saturation is 99%.   General: Alert and oriented, in no acute distress  HEENT: scalp surgical site is healing satisfactorily Skin: evidence of sun exposure diffusely to skin with healing site over frontal scalp from mohs Neck: Neck is supple, no palpable cervical , periparotid, or supraclavicular lymphadenopathy. Psychiatric: Judgment and insight are intact. Affect is appropriate.   ECOG = 1  0 - Asymptomatic (Fully active, able to carry on all predisease activities without restriction)  1 - Symptomatic but completely ambulatory (Restricted in physically strenuous activity but ambulatory and able to carry out work of a light or sedentary nature. For example, light housework, office work)  2 - Symptomatic, <50% in bed during the day (Ambulatory and capable of all self care but unable to carry out any work activities. Up and about more than 50% of waking hours)  3 - Symptomatic, >50% in bed, but not bedbound (Capable of only limited self-care, confined to bed or chair 50% or more of waking hours)  4 - Bedbound (Completely disabled. Cannot carry on any self-care. Totally confined to bed or chair)  5 - Death   Eustace Pen MM, Creech RH, Tormey DC, et al. [id number]). "Toxicity and response criteria of the [facility]". [place] Oncol. 5  (6): 649-55   LABORATORY DATA:  Lab Results  Component Value Date   WBC 110.3 (HH) 07/19/2021   HGB 10.9 (L) 07/19/2021   HCT 33.9 (L) 07/19/2021   MCV 98.3 07/19/2021   PLT 138 (L) 07/19/2021   CMP     Component Value Date/Time   NA 143 07/19/2021 1348   NA 142 05/29/2017 1141   K 4.3 07/19/2021 1348   K 4.2 05/29/2017 1141   CL 109 07/19/2021 1348   CO2 24 07/19/2021 1348   CO2 26 05/29/2017 1141   GLUCOSE 132 (H) 07/19/2021 1348   GLUCOSE 106 05/29/2017 1141   BUN 21 07/19/2021 1348   BUN 12.0 05/29/2017 1141   CREATININE 1.18 07/19/2021 1348   CREATININE 1.06 03/24/2020 0929   CREATININE 0.9 05/29/2017 1141   CALCIUM 9.5 07/19/2021 1348   CALCIUM 9.4 05/29/2017 1141   PROT 7.1 07/19/2021 1348   PROT 7.3 05/29/2017 1141   ALBUMIN 4.2 07/19/2021 1348   ALBUMIN 4.3 05/29/2017 1141   AST 22 07/19/2021 1348   AST 19 05/29/2017 1141   ALT 18 07/19/2021 1348   ALT 17 05/29/2017  1141   ALKPHOS 96 07/19/2021 1348   ALKPHOS 70 05/29/2017 1141   BILITOT 0.7 07/19/2021 1348   BILITOT 0.99 05/29/2017 1141   GFRNONAA >60 07/19/2021 1348   GFRNONAA 67 03/24/2020 0929   GFRAA >60 06/04/2020 0847   GFRAA 78 03/24/2020 0929        RADIOGRAPHY: No results found.    IMPRESSION/PLAN: Today, I talked to the patient about the findings and work-up thus far.  We discussed the patient's diagnosis of skin cancer of scalp with significant PNI and general treatment for this, highlighting the role of adjuvant radiotherapy in the management.  We discussed the available radiation techniques, and focused on the details of logistics and delivery.     We discussed the risks, benefits, and side effects of 4 weeks of hypofractionated electron radiotherapy. Side effects may include but not necessarily be limited to: skin irritation, fatigue, hair loss, rare skull/soft tissue or brain injury. No guarantees of treatment were given. A consent form was signed and placed in the patient's medical  record. The patient was encouraged to ask questions that I answered to the best of my ability.    Will proceed with RT planning in approximately 1 week, and start RT around the end of March to allow more healing.   On date of service, in total, I spent 50 minutes on this encounter. Patient was seen in person.   __________________________________________   [name], MD  This document serves as a record of services personally performed by [name], MD. It was created on her behalf by Roney Mans, a trained medical scribe. The creation of this record is based on the scribe's personal observations and the provider's statements to them. This document has been checked and approved by the attending provider.

## 2021-11-08 NOTE — Progress Notes (Signed)
Oncology Nurse Navigator Documentation  ? ?Placed introductory call to new referral patient Tony Banks.  ?Introduced myself as the H&N oncology nurse navigator that works with Dr. Isidore Moos to whom he has been referred by Dr. Winifred Olive. He confirmed understanding of referral. ?Briefly explained my role as his navigator, provided my contact information.  ?Confirmed understanding of upcoming appts and [name] location, explained arrival and registration process. ?I encouraged him to call with questions/concerns as he moves forward with appts and procedures.   ?He verbalized understanding of information provided, expressed appreciation for my call. ? ? ?Navigator Initial Assessment ?Employment Status: he is retired ?Currently on FMLA / STD:no ?Living Situation: he lives with his wife.  ?Support System: wife, family ?PCP: [name] ?PCD: na ?Financial Concerns: na ?Transportation Needs: no ?Sensory Deficits: na ?Language Barriers/Interpreter Needed:  no ?Ambulation Needs: no ?DME Used in Home: no ?Psychosocial Needs:  no ?Concerns/Needs Understanding Cancer:  addressed/answered by navigator to best of ability ?Self-Expressed Needs: no ? ? ?Harlow Asa RN, BSN, OCN ?Head & Neck Oncology Nurse Navigator ?[place] [facility] ?Phone # [phone]  ?Fax # [phone]  ?  ?

## 2021-11-09 ENCOUNTER — Ambulatory Visit
Admission: RE | Admit: 2021-11-09 | Discharge: 2021-11-09 | Disposition: A | Discharge status: Home / Self Care | Payer: Medicare HMO | Source: Ambulatory Visit | Attending: Radiation Oncology | Admitting: Radiation Oncology

## 2021-11-09 ENCOUNTER — Encounter: Payer: Self-pay | Admitting: Radiation Oncology

## 2021-11-09 ENCOUNTER — Other Ambulatory Visit: Payer: Self-pay

## 2021-11-09 VITALS — BP 145/61 | HR 82 | Temp 97.3°F | Resp 18 | Ht 68.0 in | Wt 152.4 lb

## 2021-11-09 DIAGNOSIS — Z7984 Long term (current) use of oral hypoglycemic drugs: Secondary | ICD-10-CM | POA: Insufficient documentation

## 2021-11-09 DIAGNOSIS — C9111 Chronic lymphocytic leukemia of B-cell type in remission: Secondary | ICD-10-CM | POA: Diagnosis not present

## 2021-11-09 DIAGNOSIS — Z809 Family history of malignant neoplasm, unspecified: Secondary | ICD-10-CM | POA: Insufficient documentation

## 2021-11-09 DIAGNOSIS — Z923 Personal history of irradiation: Secondary | ICD-10-CM | POA: Insufficient documentation

## 2021-11-09 DIAGNOSIS — C4442 Squamous cell carcinoma of skin of scalp and neck: Secondary | ICD-10-CM | POA: Diagnosis not present

## 2021-11-09 DIAGNOSIS — E785 Hyperlipidemia, unspecified: Secondary | ICD-10-CM | POA: Diagnosis not present

## 2021-11-09 DIAGNOSIS — Z87442 Personal history of urinary calculi: Secondary | ICD-10-CM | POA: Diagnosis not present

## 2021-11-09 DIAGNOSIS — Z87891 Personal history of nicotine dependence: Secondary | ICD-10-CM | POA: Diagnosis not present

## 2021-11-09 DIAGNOSIS — K5900 Constipation, unspecified: Secondary | ICD-10-CM | POA: Insufficient documentation

## 2021-11-09 DIAGNOSIS — Z79899 Other long term (current) drug therapy: Secondary | ICD-10-CM | POA: Diagnosis not present

## 2021-11-09 DIAGNOSIS — E119 Type 2 diabetes mellitus without complications: Secondary | ICD-10-CM | POA: Insufficient documentation

## 2021-11-09 DIAGNOSIS — I10 Essential (primary) hypertension: Secondary | ICD-10-CM | POA: Insufficient documentation

## 2021-11-09 DIAGNOSIS — C4440 Unspecified malignant neoplasm of skin of scalp and neck: Secondary | ICD-10-CM

## 2021-11-09 DIAGNOSIS — I700 Atherosclerosis of aorta: Secondary | ICD-10-CM | POA: Insufficient documentation

## 2021-11-09 NOTE — Progress Notes (Signed)
Oncology Nurse Navigator Documentation  ? ?Met with patient during initial consult with Dr. Isidore Moos.  He was accompanied by his wife.  ?Further introduced myself as his/their Navigator, explained my role as a member of the Care Team. ?They verbalized understanding of information provided. ?I encouraged them to call with questions/concerns moving forward. ? ?Tony Asa, RN, BSN, OCN ?Head & Neck Oncology Nurse Navigator ?[place] [facility] [place] ?[phone]  ?

## 2021-11-15 ENCOUNTER — Inpatient Hospital Stay: Payer: Medicare HMO | Admitting: Hematology

## 2021-11-15 ENCOUNTER — Inpatient Hospital Stay: Payer: Medicare HMO | Attending: Hematology

## 2021-11-15 ENCOUNTER — Other Ambulatory Visit: Payer: Self-pay

## 2021-11-15 VITALS — BP 140/60 | HR 79 | Temp 97.5°F | Resp 17 | Wt 153.5 lb

## 2021-11-15 DIAGNOSIS — I10 Essential (primary) hypertension: Secondary | ICD-10-CM | POA: Insufficient documentation

## 2021-11-15 DIAGNOSIS — C9110 Chronic lymphocytic leukemia of B-cell type not having achieved remission: Secondary | ICD-10-CM | POA: Diagnosis not present

## 2021-11-15 DIAGNOSIS — E119 Type 2 diabetes mellitus without complications: Secondary | ICD-10-CM | POA: Diagnosis not present

## 2021-11-15 DIAGNOSIS — D649 Anemia, unspecified: Secondary | ICD-10-CM | POA: Insufficient documentation

## 2021-11-15 DIAGNOSIS — Z79899 Other long term (current) drug therapy: Secondary | ICD-10-CM | POA: Insufficient documentation

## 2021-11-15 DIAGNOSIS — N400 Enlarged prostate without lower urinary tract symptoms: Secondary | ICD-10-CM | POA: Insufficient documentation

## 2021-11-15 DIAGNOSIS — E785 Hyperlipidemia, unspecified: Secondary | ICD-10-CM | POA: Diagnosis not present

## 2021-11-15 DIAGNOSIS — I700 Atherosclerosis of aorta: Secondary | ICD-10-CM | POA: Insufficient documentation

## 2021-11-15 LAB — CBC WITH DIFFERENTIAL/PLATELET
Abs Immature Granulocytes: 1.03 10*3/uL — ABNORMAL HIGH (ref 0.00–0.07)
Basophils Absolute: 0.1 10*3/uL (ref 0.0–0.1)
Basophils Relative: 0 %
Eosinophils Absolute: 0.4 10*3/uL (ref 0.0–0.5)
Eosinophils Relative: 0 %
HCT: 30.2 % — ABNORMAL LOW (ref 39.0–52.0)
Hemoglobin: 9.3 g/dL — ABNORMAL LOW (ref 13.0–17.0)
Immature Granulocytes: 1 %
Lymphocytes Relative: 86 %
Lymphs Abs: 120.7 10*3/uL — ABNORMAL HIGH (ref 0.7–4.0)
MCH: 30.8 pg (ref 26.0–34.0)
MCHC: 30.8 g/dL (ref 30.0–36.0)
MCV: 100 fL (ref 80.0–100.0)
Monocytes Absolute: 10.6 10*3/uL — ABNORMAL HIGH (ref 0.1–1.0)
Monocytes Relative: 8 %
Neutro Abs: 6.4 10*3/uL (ref 1.7–7.7)
Neutrophils Relative %: 5 %
Platelets: 147 10*3/uL — ABNORMAL LOW (ref 150–400)
RBC: 3.02 MIL/uL — ABNORMAL LOW (ref 4.22–5.81)
RDW: 15 % (ref 11.5–15.5)
Smear Review: NORMAL
WBC: 139.2 10*3/uL (ref 4.0–10.5)
nRBC: 0 % (ref 0.0–0.2)

## 2021-11-15 LAB — CMP (CANCER CENTER ONLY)
ALT: 13 U/L (ref 0–44)
AST: 17 U/L (ref 15–41)
Albumin: 4.5 g/dL (ref 3.5–5.0)
Alkaline Phosphatase: 107 U/L (ref 38–126)
Anion gap: 8 (ref 5–15)
BUN: 21 mg/dL (ref 8–23)
CO2: 26 mmol/L (ref 22–32)
Calcium: 9.5 mg/dL (ref 8.9–10.3)
Chloride: 111 mmol/L (ref 98–111)
Creatinine: 1.05 mg/dL (ref 0.61–1.24)
GFR, Estimated: >60 mL/min (ref >60)
Glucose, Bld: 140 mg/dL — ABNORMAL HIGH (ref 70–99)
Potassium: 3.9 mmol/L (ref 3.5–5.1)
Sodium: 145 mmol/L (ref 135–145)
Total Bilirubin: 0.5 mg/dL (ref 0.3–1.2)
Total Protein: 6.7 g/dL (ref 6.5–8.1)

## 2021-11-15 LAB — LACTATE DEHYDROGENASE: LDH: 157 U/L (ref 98–192)

## 2021-11-17 ENCOUNTER — Telehealth: Payer: Self-pay | Admitting: Hematology

## 2021-11-17 ENCOUNTER — Other Ambulatory Visit: Payer: Self-pay

## 2021-11-17 ENCOUNTER — Ambulatory Visit
Admission: RE | Admit: 2021-11-17 | Discharge: 2021-11-17 | Disposition: A | Discharge status: Home / Self Care | Payer: Medicare HMO | Source: Ambulatory Visit | Attending: Radiation Oncology | Admitting: Radiation Oncology

## 2021-11-17 ENCOUNTER — Ambulatory Visit: Payer: Medicare HMO | Admitting: Radiation Oncology

## 2021-11-17 DIAGNOSIS — Z510 Encounter for antineoplastic radiation therapy: Secondary | ICD-10-CM | POA: Diagnosis not present

## 2021-11-17 DIAGNOSIS — C4442 Squamous cell carcinoma of skin of scalp and neck: Secondary | ICD-10-CM | POA: Insufficient documentation

## 2021-11-17 NOTE — Telephone Encounter (Signed)
Left message with follow-up appointment per 3/13 los. ?

## 2021-11-21 NOTE — Progress Notes (Signed)
? ? ?HEMATOLOGY/ONCOLOGY CLINIC NOTE ? ?Date of Service: .11/15/2021 ? ? ?Patient Care Team: ?[name], MD as PCP - General (Internal Medicine) ?[name], [facility] as Pharmacist (Pharmacist) ? ?CHIEF COMPLAINTS/PURPOSE OF CONSULTATION:  ?Follow-up for continued evaluation and management of CLL. ? ?HISTORY OF PRESENTING ILLNESS:  ?Please see previous note for details of initial presentation ? ?Interval History:  ? ?Tony Banks is here for continued evaluation and management of his chronic lymphocytic leukemia. 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 ? ?Patient noted to have some developing anemia with a hemoglobin of 9.3 down from 10.9.  We discussed that it appears that at least some of this anemia is related to blood loss with his skin surgeries. ? ?Patient notes no fevers no chills no night sweats.  No unexpected weight loss. ?No new lumps or bumps. ?No new fatigue.  No new leg swelling. ? ?Lab results discussed in detail with the patient. ? ? ?MEDICAL HISTORY:  ?Past Medical History:  ?Diagnosis Date  ? Aortic atherosclerosis ([place])   ? CLL (chronic lymphocytic leukemia) ([place])   ? Constipation   ? severe  ? Diabetes mellitus without complication ([place])   ? Diverticulosis   ? HLD (hyperlipidemia)   ? Hypertension   ? Nephrolithiasis   ? ? ?SURGICAL HISTORY: ?Past Surgical History:  ?Procedure Laterality Date  ? FINGER FRACTURE SURGERY Right   ? middle finger  ? SHOULDER ARTHROSCOPY Bilateral   ? VASECTOMY    ? ? ?SOCIAL HISTORY: ?Social History  ? ?Socioeconomic History  ? Marital status: Married  ?  Spouse name: [name]  ? Number of children: 3  ? Years of education: 86  ? Highest education level: Some college, no degree  ?Occupational History  ? Occupation: retired  ?Tobacco Use  ? Smoking status: Former  ?  Types: Cigarettes  ?   Quit date: 07/31/1977  ?  Years since quitting: 44.3  ? Smokeless tobacco: Never  ? Tobacco comments:  ?  quit 30 yrs ago  ?Vaping Use  ? Vaping Use: Never used  ?Substance and Sexual Activity  ? Alcohol use: No  ? Drug use: No  ? Sexual activity: Not Currently  ?  Birth control/protection: Abstinence  ?Other Topics Concern  ? Not on file  ?Social History Narrative  ? Drinks 1-2 cups of coffee. Enjoys reading, watching tv and likes to ride his stationary bike.   ? ?Social Determinants of Health  ? ?Financial Resource Strain: Not on file  ?Food Insecurity: Not on file  ?Transportation Needs: Not on file  ?Physical Activity: Not on file  ?Stress: Not on file  ?Social Connections: Not on file  ?Intimate Partner Violence: Not on file  ? ? ?FAMILY HISTORY: ?Family History  ?Problem Relation Age of Onset  ? Heart disease Mother   ? Stroke Mother   ? Cancer Father   ? Stroke Maternal Grandfather   ? Colon cancer Neg Hx   ? Pancreatic cancer Neg Hx   ? Rectal cancer Neg Hx   ? Stomach cancer Neg Hx   ? ? ?ALLERGIES:  has no active allergies. ? ?MEDICATIONS:  ?Current Outpatient Medications  ?Medication Sig Dispense Refill  ? ACCU-CHEK AVIVA PLUS test strip USE DAILY OR UP TO 4 TIMES PER DAY AS DIRECTED). USE AS INSTRUCTED 200 strip PRN  ?  ACCU-CHEK SOFTCLIX LANCETS lancets Use as instructed 100 each 12  ? AMBULATORY NON FORMULARY MEDICATION Accu-chek aviva plus test strips and accu-chek softclix lancets, use to check blood sugar up to four times a day. Dx Type 2 diabetes. 100 Units 3  ? atorvastatin (LIPITOR) 10 MG tablet Take 1 tablet (10 mg total) by mouth daily. 90 tablet 1  ? enalapril (VASOTEC) 5 MG tablet Take 1 tablet (5 mg total) by mouth daily. 90 tablet 1  ? metFORMIN (GLUCOPHAGE) 500 MG tablet TAKE 2 TABLETS BY MOUTH EVERY DAY WITH BREAKFAST (Patient taking differently: Take 1,000 mg by mouth daily with breakfast.) 180 tablet 3  ? Multiple Vitamin (MULTIVITAMIN WITH MINERALS) TABS tablet Take 1 tablet by mouth  daily.    ? Omega-3 Fatty Acids (FISH OIL PO) Take 1,200 mg by mouth in the morning, at noon, and at bedtime.    ? polyethylene glycol (MIRALAX) packet Take 17 g by mouth daily. 28 each 0  ? HYDROcodone-acetaminophen (NORCO/VICODIN) 5-325 MG tablet Take 1 tablet by mouth every 6 (six) hours as needed. (Patient not taking: Reported on 04/12/2021) 16 tablet 0  ? ?No current facility-administered medications for this visit.  ? ? ?REVIEW OF SYSTEMS:   ?10 Point review of Systems was done is negative except as noted above. ? ?PHYSICAL EXAMINATION: ?ECOG PERFORMANCE STATUS: 0 - Asymptomatic ? ?. ?Vitals:  ? 11/15/21 1409  ?BP: 140/60  ?Pulse: 79  ?Resp: 17  ?Temp: (!) 97.5 ?F (36.4 ?C)  ?SpO2: 100%  ? ? ?Filed Weights  ? 11/15/21 1409  ?Weight: 153 lb 8 oz (69.6 kg)  ? ?.Body mass index is 23.34 kg/m?Marland Kitchen  ?NAD ?GENERAL:alert, in no acute distress and comfortable ?SKIN: no acute rashes, no significant lesions ?EYES: conjunctiva are pink and non-injected, sclera anicteric ?OROPHARYNX: MMM, no exudates, no oropharyngeal erythema or ulceration ?NECK: supple, no JVD ?LYMPH:  no palpable lymphadenopathy in the cervical, axillary or inguinal regions ?LUNGS: clear to auscultation b/l with normal respiratory effort ?HEART: regular rate & rhythm ?ABDOMEN:  normoactive bowel sounds , non tender, not distended. ?Extremity: no pedal edema ?PSYCH: alert & oriented x 3 with fluent speech ?NEURO: no focal motor/sensory deficits ? ? ? ?LABORATORY DATA:  ?I have reviewed the data as listed ? ?. ?CBC Latest Ref Rng & Units 11/15/2021 07/19/2021 04/22/2021  ?WBC 4.0 - 10.5 K/uL 139.2(HH) 110.3(HH) 91.6(HH)  ?Hemoglobin 13.0 - 17.0 g/dL 9.3(L) 10.9(L) 10.7(L)  ?Hematocrit 39.0 - 52.0 % 30.2(L) 33.9(L) 33.4(L)  ?Platelets 150 - 400 K/uL 147(L) 138(L) 142(L)  ? ?. ?CBC ?   ?Component Value Date/Time  ? WBC 139.2 (HH) 11/15/2021 1345  ? RBC 3.02 (L) 11/15/2021 1345  ? HGB 9.3 (L) 11/15/2021 1345  ? HGB 10.9 (L) 07/19/2021 1348  ? HGB 12.8 (L)  05/29/2017 1141  ? HCT 30.2 (L) 11/15/2021 1345  ? HCT 38.7 05/29/2017 1141  ? PLT 147 (L) 11/15/2021 1345  ? PLT 138 (L) 07/19/2021 1348  ? PLT 151 05/29/2017 1141  ? MCV 100.0 11/15/2021 1345  ? MCV 91.3 05/29/2017 1141  ? MCH 30.8 11/15/2021 1345  ? MCHC 30.8 11/15/2021 1345  ? RDW 15.0 11/15/2021 1345  ? RDW 13.8 05/29/2017 1141  ? LYMPHSABS 120.7 (H) 11/15/2021 1345  ? LYMPHSABS 8.8 (H) 05/29/2017 1141  ? MONOABS 10.6 (H) 11/15/2021 1345  ? MONOABS 0.7 05/29/2017 1141  ? EOSABS 0.4 11/15/2021 1345  ? EOSABS 0.1 05/29/2017 1141  ? BASOSABS 0.1 11/15/2021 1345  ? BASOSABS 0.0 05/29/2017  1141  ? ? ?. ?CMP Latest Ref Rng & Units 11/15/2021 07/19/2021 04/22/2021  ?Glucose 70 - 99 mg/dL 140(H) 132(H) 121(H)  ?BUN 8 - 23 mg/dL $Remove'21 21 16  'FkrtxjY$ ?Creatinine 0.61 - 1.24 mg/dL 1.05 1.18 1.09  ?Sodium 135 - 145 mmol/L 145 143 142  ?Potassium 3.5 - 5.1 mmol/L 3.9 4.3 4.2  ?Chloride 98 - 111 mmol/L 111 109 110  ?CO2 22 - 32 mmol/L $RemoveB'26 24 23  'KHOCNZIr$ ?Calcium 8.9 - 10.3 mg/dL 9.5 9.5 9.3  ?Total Protein 6.5 - 8.1 g/dL 6.7 7.1 6.8  ?Total Bilirubin 0.3 - 1.2 mg/dL 0.5 0.7 0.6  ?Alkaline Phos 38 - 126 U/L 107 96 86  ?AST 15 - 41 U/L $Remo'17 22 19  'QWfsZ$ ?ALT 0 - 44 U/L $Remo'13 18 18  'WjulF$ ? ?06/08/17 FISH CLL Prognostic Panel: ? ? ? ?06/08/17 Cytogenetics: ? ? ?05/29/17 Peripheral blood flow cytometry: ? ? ? ?RADIOGRAPHIC STUDIES: ?I have personally reviewed the radiological images as listed and agreed with the findings in the report. ?No results found. ? ?ASSESSMENT & PLAN:  ? ?81 y.o. male with ? ?1. Chronic Lymphocytic Leukemia ?Rai 1  ?06/08/17 CLL FISH Prognostic panel negative for del17p, del11q, del13q, indicating intermediate risk ?11/15/2019 CT C/A/P ([id number]) revealed gradual lymph node increase compared to 2018 CT. No major lymphoma progression. Mildly enlarged prostate.   ? ?PLAN: ?-Discussed lab results in detail ?CBC shows elevation of WBC counts to 139.2k, hemoglobin down to 9.3, This is significantly due to blood loss with squamous cell skin cancers  that were surgically removed. ?Anemia is less likely related to the CLL but it is possible.  No symptoms at this time. ?-No new clinical symptoms suggestive of CLL progression. ?-Labs show possible CLL progression.

## 2021-11-23 DIAGNOSIS — C4442 Squamous cell carcinoma of skin of scalp and neck: Secondary | ICD-10-CM | POA: Diagnosis not present

## 2021-11-23 DIAGNOSIS — Z510 Encounter for antineoplastic radiation therapy: Secondary | ICD-10-CM | POA: Diagnosis not present

## 2021-11-29 ENCOUNTER — Ambulatory Visit
Admission: RE | Admit: 2021-11-29 | Discharge: 2021-11-29 | Disposition: A | Discharge status: Home / Self Care | Payer: Medicare HMO | Source: Ambulatory Visit | Attending: Radiation Oncology | Admitting: Radiation Oncology

## 2021-11-29 ENCOUNTER — Ambulatory Visit: Payer: Medicare HMO | Admitting: Radiation Oncology

## 2021-11-29 ENCOUNTER — Other Ambulatory Visit: Payer: Self-pay

## 2021-11-29 DIAGNOSIS — C4442 Squamous cell carcinoma of skin of scalp and neck: Secondary | ICD-10-CM | POA: Diagnosis not present

## 2021-11-29 DIAGNOSIS — Z510 Encounter for antineoplastic radiation therapy: Secondary | ICD-10-CM | POA: Diagnosis not present

## 2021-11-29 MED ORDER — SONAFINE EX EMUL
1.0000 "application " | Freq: Two times a day (BID) | CUTANEOUS | Status: DC
  Administered 2021-11-29: 1 via TOPICAL

## 2021-11-29 NOTE — Progress Notes (Signed)
Pt here for patient teaching.   ? ?Pt given Radiation and You booklet, skin care instructions, and Sonafine.   ? ?Reviewed areas of pertinence such as fatigue, hair loss, and skin changes .  ? ?Pt able to give teach back of to pat skin, use unscented/gentle soap, and drink plenty of water,apply Sonafine bid, avoid applying anything to skin within 4 hours of treatment, and to use an electric razor if they must shave.  ? ?Pt demonstrated understanding and verbalizes understanding of information given and will contact nursing with any questions or concerns.   ? ?[email or web address]/index ?  ? ? ? ? ? ? ?

## 2021-11-29 NOTE — Progress Notes (Signed)
Oncology Nurse Navigator Documentation  ? ?To provide support, encouragement and care continuity, met with Tony Banks for his initial RT.  He was accompanied by his wife. ?Tony Banks completed treatment without difficulty, denied questions/concerns. ?I encouraged them to call me with questions/concerns as tmts proceed. ? ? ?Harlow Asa RN, BSN, OCN ?Head & Neck Oncology Nurse Navigator ?[place] at [facility] ?Phone # [phone]  ?Fax # [phone]   ?

## 2021-11-30 ENCOUNTER — Ambulatory Visit
Admission: RE | Admit: 2021-11-30 | Discharge: 2021-11-30 | Disposition: A | Discharge status: Home / Self Care | Payer: Medicare HMO | Source: Ambulatory Visit | Attending: Radiation Oncology | Admitting: Radiation Oncology

## 2021-11-30 ENCOUNTER — Other Ambulatory Visit: Payer: Self-pay

## 2021-11-30 DIAGNOSIS — C4442 Squamous cell carcinoma of skin of scalp and neck: Secondary | ICD-10-CM | POA: Diagnosis not present

## 2021-11-30 DIAGNOSIS — Z510 Encounter for antineoplastic radiation therapy: Secondary | ICD-10-CM | POA: Diagnosis not present

## 2021-12-01 ENCOUNTER — Ambulatory Visit
Admission: RE | Admit: 2021-12-01 | Discharge: 2021-12-01 | Disposition: A | Discharge status: Home / Self Care | Payer: Medicare HMO | Source: Ambulatory Visit | Attending: Radiation Oncology | Admitting: Radiation Oncology

## 2021-12-01 DIAGNOSIS — C4442 Squamous cell carcinoma of skin of scalp and neck: Secondary | ICD-10-CM | POA: Diagnosis not present

## 2021-12-01 DIAGNOSIS — Z510 Encounter for antineoplastic radiation therapy: Secondary | ICD-10-CM | POA: Diagnosis not present

## 2021-12-02 ENCOUNTER — Other Ambulatory Visit: Payer: Self-pay

## 2021-12-02 ENCOUNTER — Ambulatory Visit
Admission: RE | Admit: 2021-12-02 | Discharge: 2021-12-02 | Disposition: A | Discharge status: Home / Self Care | Payer: Medicare HMO | Source: Ambulatory Visit | Attending: Radiation Oncology | Admitting: Radiation Oncology

## 2021-12-02 DIAGNOSIS — C4442 Squamous cell carcinoma of skin of scalp and neck: Secondary | ICD-10-CM | POA: Diagnosis not present

## 2021-12-02 DIAGNOSIS — D0439 Carcinoma in situ of skin of other parts of face: Secondary | ICD-10-CM | POA: Diagnosis not present

## 2021-12-02 DIAGNOSIS — Z510 Encounter for antineoplastic radiation therapy: Secondary | ICD-10-CM | POA: Diagnosis not present

## 2021-12-03 ENCOUNTER — Ambulatory Visit
Admission: RE | Admit: 2021-12-03 | Discharge: 2021-12-03 | Disposition: A | Discharge status: Home / Self Care | Payer: Medicare HMO | Source: Ambulatory Visit | Attending: Radiation Oncology | Admitting: Radiation Oncology

## 2021-12-03 DIAGNOSIS — C4442 Squamous cell carcinoma of skin of scalp and neck: Secondary | ICD-10-CM | POA: Diagnosis not present

## 2021-12-03 DIAGNOSIS — Z510 Encounter for antineoplastic radiation therapy: Secondary | ICD-10-CM | POA: Diagnosis not present

## 2021-12-06 ENCOUNTER — Ambulatory Visit
Admission: RE | Admit: 2021-12-06 | Discharge: 2021-12-06 | Disposition: A | Discharge status: Home / Self Care | Payer: Medicare HMO | Source: Ambulatory Visit | Attending: Radiation Oncology | Admitting: Radiation Oncology

## 2021-12-06 ENCOUNTER — Other Ambulatory Visit: Payer: Self-pay

## 2021-12-06 DIAGNOSIS — Z510 Encounter for antineoplastic radiation therapy: Secondary | ICD-10-CM | POA: Diagnosis not present

## 2021-12-06 DIAGNOSIS — C4442 Squamous cell carcinoma of skin of scalp and neck: Secondary | ICD-10-CM | POA: Insufficient documentation

## 2021-12-07 ENCOUNTER — Other Ambulatory Visit: Payer: Self-pay

## 2021-12-07 ENCOUNTER — Ambulatory Visit
Admission: RE | Admit: 2021-12-07 | Discharge: 2021-12-07 | Disposition: A | Discharge status: Home / Self Care | Payer: Medicare HMO | Source: Ambulatory Visit | Attending: Radiation Oncology | Admitting: Radiation Oncology

## 2021-12-07 DIAGNOSIS — Z510 Encounter for antineoplastic radiation therapy: Secondary | ICD-10-CM | POA: Diagnosis not present

## 2021-12-07 DIAGNOSIS — C4442 Squamous cell carcinoma of skin of scalp and neck: Secondary | ICD-10-CM | POA: Diagnosis not present

## 2021-12-08 ENCOUNTER — Ambulatory Visit
Admission: RE | Admit: 2021-12-08 | Discharge: 2021-12-08 | Disposition: A | Discharge status: Home / Self Care | Payer: Medicare HMO | Source: Ambulatory Visit | Attending: Radiation Oncology | Admitting: Radiation Oncology

## 2021-12-08 DIAGNOSIS — C4442 Squamous cell carcinoma of skin of scalp and neck: Secondary | ICD-10-CM | POA: Diagnosis not present

## 2021-12-08 DIAGNOSIS — Z510 Encounter for antineoplastic radiation therapy: Secondary | ICD-10-CM | POA: Diagnosis not present

## 2021-12-09 ENCOUNTER — Other Ambulatory Visit: Payer: Self-pay

## 2021-12-09 ENCOUNTER — Ambulatory Visit
Admission: RE | Admit: 2021-12-09 | Discharge: 2021-12-09 | Disposition: A | Discharge status: Home / Self Care | Payer: Medicare HMO | Source: Ambulatory Visit | Attending: Radiation Oncology | Admitting: Radiation Oncology

## 2021-12-09 DIAGNOSIS — Z510 Encounter for antineoplastic radiation therapy: Secondary | ICD-10-CM | POA: Diagnosis not present

## 2021-12-09 DIAGNOSIS — C4442 Squamous cell carcinoma of skin of scalp and neck: Secondary | ICD-10-CM | POA: Diagnosis not present

## 2021-12-10 ENCOUNTER — Ambulatory Visit
Admission: RE | Admit: 2021-12-10 | Discharge: 2021-12-10 | Disposition: A | Discharge status: Home / Self Care | Payer: Medicare HMO | Source: Ambulatory Visit | Attending: Radiation Oncology | Admitting: Radiation Oncology

## 2021-12-10 DIAGNOSIS — Z510 Encounter for antineoplastic radiation therapy: Secondary | ICD-10-CM | POA: Diagnosis not present

## 2021-12-10 DIAGNOSIS — C4442 Squamous cell carcinoma of skin of scalp and neck: Secondary | ICD-10-CM | POA: Diagnosis not present

## 2021-12-13 ENCOUNTER — Ambulatory Visit
Admission: RE | Admit: 2021-12-13 | Discharge: 2021-12-13 | Disposition: A | Discharge status: Home / Self Care | Payer: Medicare HMO | Source: Ambulatory Visit | Attending: Radiation Oncology | Admitting: Radiation Oncology

## 2021-12-13 ENCOUNTER — Other Ambulatory Visit: Payer: Self-pay

## 2021-12-13 DIAGNOSIS — Z125 Encounter for screening for malignant neoplasm of prostate: Secondary | ICD-10-CM | POA: Diagnosis not present

## 2021-12-13 DIAGNOSIS — I10 Essential (primary) hypertension: Secondary | ICD-10-CM | POA: Diagnosis not present

## 2021-12-13 DIAGNOSIS — Z0000 Encounter for general adult medical examination without abnormal findings: Secondary | ICD-10-CM | POA: Diagnosis not present

## 2021-12-13 DIAGNOSIS — E782 Mixed hyperlipidemia: Secondary | ICD-10-CM | POA: Diagnosis not present

## 2021-12-13 DIAGNOSIS — E1165 Type 2 diabetes mellitus with hyperglycemia: Secondary | ICD-10-CM | POA: Diagnosis not present

## 2021-12-13 DIAGNOSIS — J439 Emphysema, unspecified: Secondary | ICD-10-CM | POA: Diagnosis not present

## 2021-12-13 DIAGNOSIS — C4442 Squamous cell carcinoma of skin of scalp and neck: Secondary | ICD-10-CM | POA: Diagnosis not present

## 2021-12-13 DIAGNOSIS — Z510 Encounter for antineoplastic radiation therapy: Secondary | ICD-10-CM | POA: Diagnosis not present

## 2021-12-14 ENCOUNTER — Ambulatory Visit
Admission: RE | Admit: 2021-12-14 | Discharge: 2021-12-14 | Disposition: A | Discharge status: Home / Self Care | Payer: Medicare HMO | Source: Ambulatory Visit | Attending: Radiation Oncology | Admitting: Radiation Oncology

## 2021-12-14 DIAGNOSIS — Z510 Encounter for antineoplastic radiation therapy: Secondary | ICD-10-CM | POA: Diagnosis not present

## 2021-12-14 DIAGNOSIS — C4442 Squamous cell carcinoma of skin of scalp and neck: Secondary | ICD-10-CM | POA: Diagnosis not present

## 2021-12-15 ENCOUNTER — Other Ambulatory Visit: Payer: Self-pay

## 2021-12-15 ENCOUNTER — Ambulatory Visit
Admission: RE | Admit: 2021-12-15 | Discharge: 2021-12-15 | Disposition: A | Discharge status: Home / Self Care | Payer: Medicare HMO | Source: Ambulatory Visit | Attending: Radiation Oncology | Admitting: Radiation Oncology

## 2021-12-15 DIAGNOSIS — C4442 Squamous cell carcinoma of skin of scalp and neck: Secondary | ICD-10-CM | POA: Diagnosis not present

## 2021-12-15 DIAGNOSIS — Z510 Encounter for antineoplastic radiation therapy: Secondary | ICD-10-CM | POA: Diagnosis not present

## 2021-12-16 ENCOUNTER — Ambulatory Visit
Admission: RE | Admit: 2021-12-16 | Discharge: 2021-12-16 | Disposition: A | Discharge status: Home / Self Care | Payer: Medicare HMO | Source: Ambulatory Visit | Attending: Radiation Oncology | Admitting: Radiation Oncology

## 2021-12-16 DIAGNOSIS — Z510 Encounter for antineoplastic radiation therapy: Secondary | ICD-10-CM | POA: Diagnosis not present

## 2021-12-16 DIAGNOSIS — C4442 Squamous cell carcinoma of skin of scalp and neck: Secondary | ICD-10-CM | POA: Diagnosis not present

## 2021-12-17 ENCOUNTER — Ambulatory Visit
Admission: RE | Admit: 2021-12-17 | Discharge: 2021-12-17 | Disposition: A | Discharge status: Home / Self Care | Payer: Medicare HMO | Source: Ambulatory Visit | Attending: Radiation Oncology | Admitting: Radiation Oncology

## 2021-12-17 ENCOUNTER — Other Ambulatory Visit: Payer: Self-pay

## 2021-12-17 DIAGNOSIS — Z510 Encounter for antineoplastic radiation therapy: Secondary | ICD-10-CM | POA: Diagnosis not present

## 2021-12-17 DIAGNOSIS — C4442 Squamous cell carcinoma of skin of scalp and neck: Secondary | ICD-10-CM | POA: Diagnosis not present

## 2021-12-20 ENCOUNTER — Ambulatory Visit
Admission: RE | Admit: 2021-12-20 | Discharge: 2021-12-20 | Disposition: A | Discharge status: Home / Self Care | Payer: Medicare HMO | Source: Ambulatory Visit | Attending: Radiation Oncology | Admitting: Radiation Oncology

## 2021-12-20 ENCOUNTER — Other Ambulatory Visit: Payer: Self-pay

## 2021-12-20 DIAGNOSIS — C4442 Squamous cell carcinoma of skin of scalp and neck: Secondary | ICD-10-CM | POA: Diagnosis not present

## 2021-12-20 DIAGNOSIS — Z510 Encounter for antineoplastic radiation therapy: Secondary | ICD-10-CM | POA: Diagnosis not present

## 2021-12-21 ENCOUNTER — Ambulatory Visit
Admission: RE | Admit: 2021-12-21 | Discharge: 2021-12-21 | Disposition: A | Discharge status: Home / Self Care | Payer: Medicare HMO | Source: Ambulatory Visit | Attending: Radiation Oncology | Admitting: Radiation Oncology

## 2021-12-21 ENCOUNTER — Other Ambulatory Visit: Payer: Self-pay

## 2021-12-21 DIAGNOSIS — C4442 Squamous cell carcinoma of skin of scalp and neck: Secondary | ICD-10-CM | POA: Diagnosis not present

## 2021-12-21 DIAGNOSIS — Z510 Encounter for antineoplastic radiation therapy: Secondary | ICD-10-CM | POA: Diagnosis not present

## 2021-12-21 LAB — RAD ONC ARIA SESSION SUMMARY
Course Elapsed Days: 22
Plan Fractions Treated to Date: 17
Plan Prescribed Dose Per Fraction: 2.5 Gy
Plan Total Fractions Prescribed: 20
Plan Total Prescribed Dose: 50 Gy
Reference Point Dosage Given to Date: 42.5 Gy
Reference Point Session Dosage Given: 2.5 Gy
Session Number: 17

## 2021-12-22 ENCOUNTER — Other Ambulatory Visit: Payer: Self-pay

## 2021-12-22 ENCOUNTER — Ambulatory Visit
Admission: RE | Admit: 2021-12-22 | Discharge: 2021-12-22 | Disposition: A | Discharge status: Home / Self Care | Payer: Medicare HMO | Source: Ambulatory Visit | Attending: Radiation Oncology | Admitting: Radiation Oncology

## 2021-12-22 DIAGNOSIS — Z510 Encounter for antineoplastic radiation therapy: Secondary | ICD-10-CM | POA: Diagnosis not present

## 2021-12-22 DIAGNOSIS — C4442 Squamous cell carcinoma of skin of scalp and neck: Secondary | ICD-10-CM | POA: Diagnosis not present

## 2021-12-22 LAB — RAD ONC ARIA SESSION SUMMARY
Course Elapsed Days: 23
Plan Fractions Treated to Date: 18
Plan Prescribed Dose Per Fraction: 2.5 Gy
Plan Total Fractions Prescribed: 20
Plan Total Prescribed Dose: 50 Gy
Reference Point Dosage Given to Date: 45 Gy
Reference Point Session Dosage Given: 2.5 Gy
Session Number: 18

## 2021-12-23 ENCOUNTER — Ambulatory Visit
Admission: RE | Admit: 2021-12-23 | Discharge: 2021-12-23 | Disposition: A | Discharge status: Home / Self Care | Payer: Medicare HMO | Source: Ambulatory Visit | Attending: Radiation Oncology | Admitting: Radiation Oncology

## 2021-12-23 ENCOUNTER — Other Ambulatory Visit: Payer: Self-pay

## 2021-12-23 DIAGNOSIS — Z510 Encounter for antineoplastic radiation therapy: Secondary | ICD-10-CM | POA: Diagnosis not present

## 2021-12-23 DIAGNOSIS — C4442 Squamous cell carcinoma of skin of scalp and neck: Secondary | ICD-10-CM | POA: Diagnosis not present

## 2021-12-23 LAB — RAD ONC ARIA SESSION SUMMARY
Course Elapsed Days: 24
Plan Fractions Treated to Date: 19
Plan Prescribed Dose Per Fraction: 2.5 Gy
Plan Total Fractions Prescribed: 20
Plan Total Prescribed Dose: 50 Gy
Reference Point Dosage Given to Date: 47.5 Gy
Reference Point Session Dosage Given: 2.5 Gy
Session Number: 19

## 2021-12-24 ENCOUNTER — Other Ambulatory Visit: Payer: Self-pay

## 2021-12-24 ENCOUNTER — Ambulatory Visit
Admission: RE | Admit: 2021-12-24 | Discharge: 2021-12-24 | Disposition: A | Discharge status: Home / Self Care | Payer: Medicare HMO | Source: Ambulatory Visit | Attending: Radiation Oncology | Admitting: Radiation Oncology

## 2021-12-24 ENCOUNTER — Encounter: Payer: Self-pay | Admitting: Radiation Oncology

## 2021-12-24 DIAGNOSIS — C4442 Squamous cell carcinoma of skin of scalp and neck: Secondary | ICD-10-CM | POA: Diagnosis not present

## 2021-12-24 DIAGNOSIS — Z510 Encounter for antineoplastic radiation therapy: Secondary | ICD-10-CM | POA: Diagnosis not present

## 2021-12-24 LAB — RAD ONC ARIA SESSION SUMMARY
Course Elapsed Days: 25
Plan Fractions Treated to Date: 20
Plan Prescribed Dose Per Fraction: 2.5 Gy
Plan Total Fractions Prescribed: 20
Plan Total Prescribed Dose: 50 Gy
Reference Point Dosage Given to Date: 50 Gy
Reference Point Session Dosage Given: 2.5 Gy
Session Number: 20

## 2021-12-24 NOTE — Progress Notes (Signed)
Oncology Nurse Navigator Documentation  ? ?Mr. Tabor completed radiation treatment today without difficulty. He knows to call me if he has any needs before his follow up with Dr. Isidore Moos.  ? ? ?Harlow Asa RN, BSN, OCN ?Head & Neck Oncology Nurse Navigator ?[place] at [facility] ?Phone # [phone]  ?Fax # [phone]   ?

## 2021-12-27 DIAGNOSIS — H4312 Vitreous hemorrhage, left eye: Secondary | ICD-10-CM | POA: Diagnosis not present

## 2021-12-28 DIAGNOSIS — H43813 Vitreous degeneration, bilateral: Secondary | ICD-10-CM | POA: Diagnosis not present

## 2021-12-28 DIAGNOSIS — H35363 Drusen (degenerative) of macula, bilateral: Secondary | ICD-10-CM | POA: Diagnosis not present

## 2021-12-28 DIAGNOSIS — H35033 Hypertensive retinopathy, bilateral: Secondary | ICD-10-CM | POA: Diagnosis not present

## 2021-12-28 DIAGNOSIS — E113313 Type 2 diabetes mellitus with moderate nonproliferative diabetic retinopathy with macular edema, bilateral: Secondary | ICD-10-CM | POA: Diagnosis not present

## 2021-12-30 DIAGNOSIS — Z481 Encounter for planned postprocedural wound closure: Secondary | ICD-10-CM | POA: Diagnosis not present

## 2021-12-30 DIAGNOSIS — D049 Carcinoma in situ of skin, unspecified: Secondary | ICD-10-CM | POA: Diagnosis not present

## 2021-12-30 DIAGNOSIS — D0422 Carcinoma in situ of skin of left ear and external auricular canal: Secondary | ICD-10-CM | POA: Diagnosis not present

## 2022-01-28 ENCOUNTER — Ambulatory Visit: Payer: Self-pay | Admitting: Radiation Oncology

## 2022-02-10 DIAGNOSIS — I10 Essential (primary) hypertension: Secondary | ICD-10-CM | POA: Diagnosis not present

## 2022-02-10 DIAGNOSIS — E1165 Type 2 diabetes mellitus with hyperglycemia: Secondary | ICD-10-CM | POA: Diagnosis not present

## 2022-02-10 DIAGNOSIS — Z0001 Encounter for general adult medical examination with abnormal findings: Secondary | ICD-10-CM | POA: Diagnosis not present

## 2022-02-10 DIAGNOSIS — E782 Mixed hyperlipidemia: Secondary | ICD-10-CM | POA: Diagnosis not present

## 2022-02-11 NOTE — Progress Notes (Signed)
                                                                                                                                                             Patient Name: Tony Banks MRN: [id number] DOB: Nov 02, 1940 Referring Physician: [name] Date of Service: 12/24/2021 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   Plan: The patient will follow-up with radiation oncology in 12mo.  -----------------------------------  SEppie Gibson MD

## 2022-02-14 ENCOUNTER — Other Ambulatory Visit: Payer: Self-pay

## 2022-02-14 DIAGNOSIS — C9110 Chronic lymphocytic leukemia of B-cell type not having achieved remission: Secondary | ICD-10-CM

## 2022-02-15 ENCOUNTER — Other Ambulatory Visit: Payer: Self-pay

## 2022-02-15 ENCOUNTER — Inpatient Hospital Stay: Payer: Medicare HMO | Attending: Hematology | Admitting: Hematology

## 2022-02-15 ENCOUNTER — Inpatient Hospital Stay: Payer: Medicare HMO

## 2022-02-15 ENCOUNTER — Other Ambulatory Visit: Payer: Self-pay | Admitting: Lab

## 2022-02-15 VITALS — BP 125/59 | HR 77 | Temp 98.1°F | Resp 18 | Wt 149.4 lb

## 2022-02-15 DIAGNOSIS — C9110 Chronic lymphocytic leukemia of B-cell type not having achieved remission: Secondary | ICD-10-CM | POA: Insufficient documentation

## 2022-02-15 DIAGNOSIS — D649 Anemia, unspecified: Secondary | ICD-10-CM | POA: Insufficient documentation

## 2022-02-15 DIAGNOSIS — N400 Enlarged prostate without lower urinary tract symptoms: Secondary | ICD-10-CM | POA: Insufficient documentation

## 2022-02-15 DIAGNOSIS — Z87891 Personal history of nicotine dependence: Secondary | ICD-10-CM | POA: Insufficient documentation

## 2022-02-15 LAB — CBC WITH DIFFERENTIAL (CANCER CENTER ONLY)
Abs Immature Granulocytes: 0.86 10*3/uL — ABNORMAL HIGH (ref 0.00–0.07)
Basophils Absolute: 0.1 10*3/uL (ref 0.0–0.1)
Basophils Relative: 0 %
Eosinophils Absolute: 0.4 10*3/uL (ref 0.0–0.5)
Eosinophils Relative: 0 %
HCT: 31.3 % — ABNORMAL LOW (ref 39.0–52.0)
Hemoglobin: 10 g/dL — ABNORMAL LOW (ref 13.0–17.0)
Immature Granulocytes: 1 %
Lymphocytes Relative: 90 %
Lymphs Abs: 143.2 10*3/uL — ABNORMAL HIGH (ref 0.7–4.0)
MCH: 31.3 pg (ref 26.0–34.0)
MCHC: 31.9 g/dL (ref 30.0–36.0)
MCV: 97.8 fL (ref 80.0–100.0)
Monocytes Absolute: 7.6 10*3/uL — ABNORMAL HIGH (ref 0.1–1.0)
Monocytes Relative: 5 %
Neutro Abs: 6.2 10*3/uL (ref 1.7–7.7)
Neutrophils Relative %: 4 %
Platelet Count: 146 10*3/uL — ABNORMAL LOW (ref 150–400)
RBC: 3.2 MIL/uL — ABNORMAL LOW (ref 4.22–5.81)
RDW: 15.9 % — ABNORMAL HIGH (ref 11.5–15.5)
Smear Review: NORMAL
WBC Count: 158.4 10*3/uL (ref 4.0–10.5)
nRBC: 0 % (ref 0.0–0.2)

## 2022-02-15 LAB — CMP (CANCER CENTER ONLY)
ALT: 15 U/L (ref 0–44)
AST: 21 U/L (ref 15–41)
Albumin: 4.7 g/dL (ref 3.5–5.0)
Alkaline Phosphatase: 85 U/L (ref 38–126)
Anion gap: 8 (ref 5–15)
BUN: 19 mg/dL (ref 8–23)
CO2: 26 mmol/L (ref 22–32)
Calcium: 9.8 mg/dL (ref 8.9–10.3)
Chloride: 107 mmol/L (ref 98–111)
Creatinine: 1.21 mg/dL (ref 0.61–1.24)
GFR, Estimated: >60 mL/min (ref >60)
Glucose, Bld: 101 mg/dL — ABNORMAL HIGH (ref 70–99)
Potassium: 4.3 mmol/L (ref 3.5–5.1)
Sodium: 141 mmol/L (ref 135–145)
Total Bilirubin: 0.7 mg/dL (ref 0.3–1.2)
Total Protein: 7.5 g/dL (ref 6.5–8.1)

## 2022-02-15 LAB — LACTATE DEHYDROGENASE: LDH: 175 U/L (ref 98–192)

## 2022-02-21 NOTE — Progress Notes (Signed)
HEMATOLOGY/ONCOLOGY CLINIC NOTE  Date of Service: .02/15/2022   Patient Care Team: Tony Mccreedy, MD as PCP - General (Internal Medicine) Tony Banks, [facility] as Pharmacist (Pharmacist) [name], [name], RN as Oncology Nurse Navigator [name], MD as Consulting Physician (Radiation Oncology) [name], MD as Consulting Physician (Hematology) [name], MD as Referring Physician (Dermatology)  CHIEF COMPLAINTS/PURPOSE OF CONSULTATION:  Follow-up for continued evaluation and management of CLL  HISTORY OF PRESENTING ILLNESS:  Please see previous note for details of initial presentation  Interval History:   Patient is here for continued evaluation and management of his CLL.Tony Banks  He notes no acute new symptoms since his last clinic visit.  He has completed radiation therapy to squamous cell carcinoma lesion on his scalp.  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 Patient notes no fevers no chills no night sweats. No new lumps or bumps. No new fatigue or bleeding issues or infections. Labs done today were reviewed in detail with him.  MEDICAL HISTORY:  Past Medical History:  Diagnosis Date   Aortic atherosclerosis ([facility])    CLL (chronic lymphocytic leukemia) ([facility])    Constipation    severe   Diabetes mellitus without complication ([facility])    Diverticulosis    HLD (hyperlipidemia)    Hypertension    Nephrolithiasis     SURGICAL HISTORY: Past Surgical History:  Procedure Laterality Date   FINGER FRACTURE SURGERY Right    middle finger   SHOULDER ARTHROSCOPY Bilateral    VASECTOMY      SOCIAL HISTORY: Social History   Socioeconomic History   Marital status: Married    Spouse name: Congress   Number of children: 3   Years of education: 14   Highest education level: Some college, no degree  Occupational History   Occupation: retired  Tobacco Use   Smoking status:  Former    Types: Cigarettes    Quit date: 07/31/1977    Years since quitting: 44.5   Smokeless tobacco: Never   Tobacco comments:    quit 30 yrs ago  Vaping Use   Vaping Use: Never used  Substance and Sexual Activity   Alcohol use: No   Drug use: No   Sexual activity: Not Currently    Birth control/protection: Abstinence  Other Topics Concern   Not on file  Social History Narrative   Drinks 1-2 cups of coffee. Enjoys reading, watching tv and likes to ride his stationary bike.    Social Determinants of Health   Financial Resource Strain: Low Risk  (10/12/2020)   Overall Financial Resource Strain (CARDIA)    Difficulty of Paying Living Expenses: Not hard at all  Food Insecurity: No Food Insecurity (10/12/2020)   Hunger Vital Sign    Worried About Running Out of Food in the Last Year: Never true    Ran Out of Food in the Last Year: Never true  Transportation Needs: No Transportation Needs (10/12/2020)   PRAPARE - Hydrologist (Medical): No    Lack of Transportation (Non-Medical): No  Physical Activity: Sufficiently Active (10/12/2020)   Exercise Vital Sign    Days of Exercise per Week: 7 days    Minutes of Exercise per Session: 80 min  Stress: No Stress Concern Present (10/12/2020)   [place]    Feeling of Stress : Not at all  Social Connections: Moderately Isolated (  10/12/2020)   Social Connection and Isolation Panel [NHANES]    Frequency of Communication with Friends and Family: Once a week    Frequency of Social Gatherings with Friends and Family: Once a week    Attends Religious Services: More than 4 times per year    Active Member of [employer] or Organizations: No    Attends Archivist Meetings: Never    Marital Status: Married  Human resources officer Violence: Not At Risk (10/12/2020)   Humiliation, Afraid, Rape, and Kick questionnaire    Fear of Current or Ex-Partner: No     Emotionally Abused: No    Physically Abused: No    Sexually Abused: No    FAMILY HISTORY: Family History  Problem Relation Age of Onset   Heart disease Mother    Stroke Mother    Cancer Father    Stroke Maternal Grandfather    Colon cancer Neg Hx    Pancreatic cancer Neg Hx    Rectal cancer Neg Hx    Stomach cancer Neg Hx     ALLERGIES:  has no active allergies.  MEDICATIONS:  Current Outpatient Medications  Medication Sig Dispense Refill   ACCU-CHEK AVIVA PLUS test strip USE DAILY OR UP TO 4 TIMES PER DAY AS DIRECTED). USE AS INSTRUCTED 200 strip PRN   ACCU-CHEK SOFTCLIX LANCETS lancets Use as instructed 100 each 12   AMBULATORY NON FORMULARY MEDICATION Accu-chek aviva plus test strips and accu-chek softclix lancets, use to check blood sugar up to four times a day. Dx Type 2 diabetes. 100 Units 3   atorvastatin (LIPITOR) 10 MG tablet Take 1 tablet (10 mg total) by mouth daily. 90 tablet 1   enalapril (VASOTEC) 5 MG tablet Take 1 tablet (5 mg total) by mouth daily. 90 tablet 1   HYDROcodone-acetaminophen (NORCO/VICODIN) 5-325 MG tablet Take 1 tablet by mouth every 6 (six) hours as needed. (Patient not taking: Reported on 04/12/2021) 16 tablet 0   metFORMIN (GLUCOPHAGE) 500 MG tablet TAKE 2 TABLETS BY MOUTH EVERY DAY WITH BREAKFAST (Patient taking differently: Take 1,000 mg by mouth daily with breakfast.) 180 tablet 3   Multiple Vitamin (MULTIVITAMIN WITH MINERALS) TABS tablet Take 1 tablet by mouth daily.     Omega-3 Fatty Acids (FISH OIL PO) Take 1,200 mg by mouth in the morning, at noon, and at bedtime.     polyethylene glycol (MIRALAX) packet Take 17 g by mouth daily. 28 each 0   No current facility-administered medications for this visit.    REVIEW OF SYSTEMS:   10 Point review of Systems was done is negative except as noted above.  PHYSICAL EXAMINATION: ECOG PERFORMANCE STATUS: 0 - Asymptomatic  . Vitals:   02/15/22 1318  BP: (!) 125/59  Pulse: 77  Resp: 18  Temp:  98.1 F (36.7 C)  SpO2: 98%    Filed Weights   02/15/22 1318  Weight: 149 lb 6.4 oz (67.8 kg)   .Body mass index is 22.72 kg/m.  NAD GENERAL:alert, in no acute distress and comfortable SKIN: no acute rashes, no significant lesions EYES: conjunctiva are pink and non-injected, sclera anicteric OROPHARYNX: MMM, no exudates, no oropharyngeal erythema or ulceration NECK: supple, no JVD LYMPH:  no palpable lymphadenopathy in the cervical, axillary or inguinal regions LUNGS: clear to auscultation b/l with normal respiratory effort HEART: regular rate & rhythm ABDOMEN:  normoactive bowel sounds , non tender, not distended. Extremity: no pedal edema PSYCH: alert & oriented x 3 with fluent speech NEURO: no focal motor/sensory  deficits     LABORATORY DATA:  I have reviewed the data as listed  .    Latest Ref Rng & Units 02/15/2022    1:00 PM 11/15/2021    1:45 PM 07/19/2021    1:48 PM  CBC  WBC 4.0 - 10.5 K/uL 158.4  139.2  110.3   Hemoglobin 13.0 - 17.0 g/dL 10.0  9.3  10.9   Hematocrit 39.0 - 52.0 % 31.3  30.2  33.9   Platelets 150 - 400 K/uL 146  147  138    . CBC    Component Value Date/Time   WBC 158.4 (HH) 02/15/2022 1300   WBC 139.2 (HH) 11/15/2021 1345   RBC 3.20 (L) 02/15/2022 1300   HGB 10.0 (L) 02/15/2022 1300   HGB 12.8 (L) 05/29/2017 1141   HCT 31.3 (L) 02/15/2022 1300   HCT 38.7 05/29/2017 1141   PLT 146 (L) 02/15/2022 1300   PLT 151 05/29/2017 1141   MCV 97.8 02/15/2022 1300   MCV 91.3 05/29/2017 1141   MCH 31.3 02/15/2022 1300   MCHC 31.9 02/15/2022 1300   RDW 15.9 (H) 02/15/2022 1300   RDW 13.8 05/29/2017 1141   LYMPHSABS 143.2 (H) 02/15/2022 1300   LYMPHSABS 8.8 (H) 05/29/2017 1141   MONOABS 7.6 (H) 02/15/2022 1300   MONOABS 0.7 05/29/2017 1141   EOSABS 0.4 02/15/2022 1300   EOSABS 0.1 05/29/2017 1141   BASOSABS 0.1 02/15/2022 1300   BASOSABS 0.0 05/29/2017 1141    .    Latest Ref Rng & Units 02/15/2022    1:00 PM 11/15/2021    1:45 PM  07/19/2021    1:48 PM  CMP  Glucose 70 - 99 mg/dL 101  140  132   BUN 8 - 23 mg/dL $Remove'19  21  21   'TfEOXCd$ Creatinine 0.61 - 1.24 mg/dL 1.21  1.05  1.18   Sodium 135 - 145 mmol/L 141  145  143   Potassium 3.5 - 5.1 mmol/L 4.3  3.9  4.3   Chloride 98 - 111 mmol/L 107  111  109   CO2 22 - 32 mmol/L $RemoveB'26  26  24   'FFiFjOlk$ Calcium 8.9 - 10.3 mg/dL 9.8  9.5  9.5   Total Protein 6.5 - 8.1 g/dL 7.5  6.7  7.1   Total Bilirubin 0.3 - 1.2 mg/dL 0.7  0.5  0.7   Alkaline Phos 38 - 126 U/L 85  107  96   AST 15 - 41 U/L $Remo'21  17  22   'XdQhv$ ALT 0 - 44 U/L $Remo'15  13  18    'JQMLH$ 06/08/17 FISH CLL Prognostic Panel:    06/08/17 Cytogenetics:   05/29/17 Peripheral blood flow cytometry:    RADIOGRAPHIC STUDIES: I have personally reviewed the radiological images as listed and agreed with the findings in the report. No results found.  ASSESSMENT & PLAN:   81 y.o. male with  1. Chronic Lymphocytic Leukemia Rai 1  06/08/17 CLL FISH Prognostic panel negative for del17p, del11q, del13q, indicating intermediate risk 11/15/2019 CT C/A/P ([id number]) revealed gradual lymph node increase compared to 2018 CT. No major lymphoma progression. Mildly enlarged prostate.    PLAN: -Labs done today were discussed in detail with the patient. CBC shows gradually progressing WBC count.  Mild stable anemia with hemoglobin of 10 platelets of 146k CMP stable Patient has no significant lab or clinical evidence of CLL progression at this time to suggest need for initiating treatment.  2.  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 -Continue to monitor and manage per drains were dermatology  FOLLOW UP: Return to clinic with Dr. Irene Limbo with labs in 3 months  .The total time spent in the appointment was 20 minutes* .  All of the patient's questions were answered with apparent satisfaction. The patient knows to call the clinic with any problems, questions or concerns.   Sullivan Lone MD MS AAHIVMS [facility]  [facility] Hematology/Oncology Physician [facility]  .*Total Encounter Time as defined by the Centers for Medicare and Medicaid Services includes, in addition to the face-to-face time of a patient visit (documented in the note above) non-face-to-face time: obtaining and reviewing outside history, ordering and reviewing medications, tests or procedures, care coordination (communications with other health care professionals or caregivers) and documentation in the medical record.

## 2022-03-14 DIAGNOSIS — D0439 Carcinoma in situ of skin of other parts of face: Secondary | ICD-10-CM | POA: Diagnosis not present

## 2022-05-03 DIAGNOSIS — H52203 Unspecified astigmatism, bilateral: Secondary | ICD-10-CM | POA: Diagnosis not present

## 2022-05-03 DIAGNOSIS — E119 Type 2 diabetes mellitus without complications: Secondary | ICD-10-CM | POA: Diagnosis not present

## 2022-05-03 DIAGNOSIS — H5203 Hypermetropia, bilateral: Secondary | ICD-10-CM | POA: Diagnosis not present

## 2022-05-03 DIAGNOSIS — H353111 Nonexudative age-related macular degeneration, right eye, early dry stage: Secondary | ICD-10-CM | POA: Diagnosis not present

## 2022-05-17 ENCOUNTER — Other Ambulatory Visit: Payer: Self-pay

## 2022-05-17 DIAGNOSIS — C9110 Chronic lymphocytic leukemia of B-cell type not having achieved remission: Secondary | ICD-10-CM

## 2022-05-18 ENCOUNTER — Inpatient Hospital Stay: Payer: Medicare HMO | Attending: Hematology | Admitting: Hematology

## 2022-05-18 ENCOUNTER — Inpatient Hospital Stay: Payer: Medicare HMO

## 2022-05-18 ENCOUNTER — Other Ambulatory Visit: Payer: Self-pay

## 2022-05-18 VITALS — BP 123/58 | HR 87 | Temp 98.1°F | Resp 15 | Wt 150.9 lb

## 2022-05-18 DIAGNOSIS — Z809 Family history of malignant neoplasm, unspecified: Secondary | ICD-10-CM | POA: Diagnosis not present

## 2022-05-18 DIAGNOSIS — K5900 Constipation, unspecified: Secondary | ICD-10-CM | POA: Diagnosis not present

## 2022-05-18 DIAGNOSIS — E119 Type 2 diabetes mellitus without complications: Secondary | ICD-10-CM | POA: Insufficient documentation

## 2022-05-18 DIAGNOSIS — E785 Hyperlipidemia, unspecified: Secondary | ICD-10-CM | POA: Diagnosis not present

## 2022-05-18 DIAGNOSIS — Z85828 Personal history of other malignant neoplasm of skin: Secondary | ICD-10-CM | POA: Insufficient documentation

## 2022-05-18 DIAGNOSIS — N400 Enlarged prostate without lower urinary tract symptoms: Secondary | ICD-10-CM | POA: Diagnosis not present

## 2022-05-18 DIAGNOSIS — D649 Anemia, unspecified: Secondary | ICD-10-CM | POA: Insufficient documentation

## 2022-05-18 DIAGNOSIS — C9111 Chronic lymphocytic leukemia of B-cell type in remission: Secondary | ICD-10-CM | POA: Diagnosis not present

## 2022-05-18 DIAGNOSIS — C9110 Chronic lymphocytic leukemia of B-cell type not having achieved remission: Secondary | ICD-10-CM | POA: Diagnosis not present

## 2022-05-18 DIAGNOSIS — I10 Essential (primary) hypertension: Secondary | ICD-10-CM | POA: Insufficient documentation

## 2022-05-18 DIAGNOSIS — Z87442 Personal history of urinary calculi: Secondary | ICD-10-CM | POA: Insufficient documentation

## 2022-05-18 DIAGNOSIS — Z79899 Other long term (current) drug therapy: Secondary | ICD-10-CM | POA: Insufficient documentation

## 2022-05-18 LAB — CBC WITH DIFFERENTIAL (CANCER CENTER ONLY)
Abs Immature Granulocytes: 0.86 10*3/uL — ABNORMAL HIGH (ref 0.00–0.07)
Basophils Absolute: 0.1 10*3/uL (ref 0.0–0.1)
Basophils Relative: 0 %
Eosinophils Absolute: 0.3 10*3/uL (ref 0.0–0.5)
Eosinophils Relative: 0 %
HCT: 29.3 % — ABNORMAL LOW (ref 39.0–52.0)
Hemoglobin: 9.4 g/dL — ABNORMAL LOW (ref 13.0–17.0)
Immature Granulocytes: 1 %
Lymphocytes Relative: 87 %
Lymphs Abs: 142.4 10*3/uL — ABNORMAL HIGH (ref 0.7–4.0)
MCH: 32 pg (ref 26.0–34.0)
MCHC: 32.1 g/dL (ref 30.0–36.0)
MCV: 99.7 fL (ref 80.0–100.0)
Monocytes Absolute: 13.3 10*3/uL — ABNORMAL HIGH (ref 0.1–1.0)
Monocytes Relative: 8 %
Neutro Abs: 7 10*3/uL (ref 1.7–7.7)
Neutrophils Relative %: 4 %
Platelet Count: 129 10*3/uL — ABNORMAL LOW (ref 150–400)
RBC: 2.94 MIL/uL — ABNORMAL LOW (ref 4.22–5.81)
RDW: 15.3 % (ref 11.5–15.5)
Smear Review: NORMAL
WBC Count: 164 10*3/uL (ref 4.0–10.5)
nRBC: 0 % (ref 0.0–0.2)

## 2022-05-18 LAB — CMP (CANCER CENTER ONLY)
ALT: 18 U/L (ref 0–44)
AST: 27 U/L (ref 15–41)
Albumin: 4.5 g/dL (ref 3.5–5.0)
Alkaline Phosphatase: 86 U/L (ref 38–126)
Anion gap: 8 (ref 5–15)
BUN: 27 mg/dL — ABNORMAL HIGH (ref 8–23)
CO2: 22 mmol/L (ref 22–32)
Calcium: 9.4 mg/dL (ref 8.9–10.3)
Chloride: 112 mmol/L — ABNORMAL HIGH (ref 98–111)
Creatinine: 1.37 mg/dL — ABNORMAL HIGH (ref 0.61–1.24)
GFR, Estimated: 52 mL/min — ABNORMAL LOW (ref >60)
Glucose, Bld: 100 mg/dL — ABNORMAL HIGH (ref 70–99)
Potassium: 4.5 mmol/L (ref 3.5–5.1)
Sodium: 142 mmol/L (ref 135–145)
Total Bilirubin: 0.7 mg/dL (ref 0.3–1.2)
Total Protein: 7.2 g/dL (ref 6.5–8.1)

## 2022-05-18 LAB — LACTATE DEHYDROGENASE: LDH: 162 U/L (ref 98–192)

## 2022-05-18 MED ORDER — SLOW FE 142 (45 FE) MG PO TBCR: 1.0000 | EXTENDED_RELEASE_TABLET | Freq: Every day | ORAL | Status: AC

## 2022-05-24 NOTE — Progress Notes (Signed)
HEMATOLOGY/ONCOLOGY CLINIC NOTE  Date of Service: 05/18/2022   Patient Care Team: Jackie Plum, MD as PCP - General (Internal Medicine) Gabriel Carina, [facility] as Pharmacist (Pharmacist) [name], Lise Auer, RN as Oncology Nurse Navigator Lonie Peak, MD as Consulting Physician (Radiation Oncology) Johney Maine, MD as Consulting Physician (Hematology) Glennis Brink, MD as Referring Physician (Dermatology)  CHIEF COMPLAINTS/PURPOSE OF CONSULTATION:  Follow-up for continued evaluation and management of CLL  HISTORY OF PRESENTING ILLNESS:  Please see previous note for details of initial presentation  INTERVAL HISTORY:  Tony Banks is a 81 y.o. 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 He reports He is doing well with no new symptoms or concerns.  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  No fever, chills, night sweats. No new fatigue. No new bleeding issues. No new infection issues. No other new or acute focal symptoms.  Labs done today were reviewed in detail with him. We discussed that he does have significant anemia with a hemoglobin of 9.4 and his WBC counts are gradually increasing to 164k.  He notes he feels well and wants to hold off on any treatment considerations at this time.  MEDICAL HISTORY:  Past Medical History:  Diagnosis Date   Aortic atherosclerosis (HCC)    CLL (chronic lymphocytic leukemia) (HCC)    Constipation    severe   Diabetes mellitus without complication (HCC)    Diverticulosis    HLD (hyperlipidemia)    Hypertension    Nephrolithiasis     SURGICAL HISTORY: Past Surgical History:  Procedure Laterality Date   FINGER FRACTURE SURGERY Right    middle finger   SHOULDER ARTHROSCOPY Bilateral    VASECTOMY      SOCIAL HISTORY: Social History   Socioeconomic History   Marital status: Married    Spouse name: [name]    Number of children: 3   Years of education: 14   Highest education level: Some college, no degree  Occupational History   Occupation: retired  Tobacco Use   Smoking status: Former    Types: Cigarettes    Quit date: 07/31/1977    Years since quitting: 44.8   Smokeless tobacco: Never   Tobacco comments:    quit 30 yrs ago  Vaping Use   Vaping Use: Never used  Substance and Sexual Activity   Alcohol use: No   Drug use: No   Sexual activity: Not Currently    Birth control/protection: Abstinence  Other Topics Concern   Not on file  Social History Narrative   Drinks 1-2 cups of coffee. Enjoys reading, watching tv and likes to ride his stationary bike.    Social Determinants of Health   Financial Resource Strain: Low Risk  (10/12/2020)   Overall Financial Resource Strain (CARDIA)    Difficulty of Paying Living Expenses: Not hard at all  Food Insecurity: No Food Insecurity (10/12/2020)   Hunger Vital Sign    Worried About Running Out of Food in the Last Year: Never true    Ran Out of Food in the Last Year: Never true  Transportation Needs: No Transportation Needs (10/12/2020)   PRAPARE - Administrator, Civil Service (Medical): No    Lack of Transportation (Non-Medical): No  Physical Activity: Sufficiently Active (10/12/2020)   Exercise Vital Sign    Days of Exercise per Week: 7 days    Minutes of Exercise per Session:  80 min  Stress: No Stress Concern Present (10/12/2020)   [place]    Feeling of Stress : Not at all  Social Connections: Moderately Isolated (10/12/2020)   Social Connection and Isolation Panel [NHANES]    Frequency of Communication with Friends and Family: Once a week    Frequency of Social Gatherings with Friends and Family: Once a week    Attends Religious Services: More than 4 times per year    Active Member of [employer] or Organizations: No    Attends Archivist Meetings: Never     Marital Status: Married  Human resources officer Violence: Not At Risk (10/12/2020)   Humiliation, Afraid, Rape, and Kick questionnaire    Fear of Current or Ex-Partner: No    Emotionally Abused: No    Physically Abused: No    Sexually Abused: No    FAMILY HISTORY: Family History  Problem Relation Age of Onset   Heart disease Mother    Stroke Mother    Cancer Father    Stroke Maternal Grandfather    Colon cancer Neg Hx    Pancreatic cancer Neg Hx    Rectal cancer Neg Hx    Stomach cancer Neg Hx     ALLERGIES:  has no active allergies.  MEDICATIONS:  Current Outpatient Medications  Medication Sig Dispense Refill   Ferrous Sulfate (SLOW FE) 142 (45 Fe) MG TBCR Take 1 tablet by mouth daily. 30 tablet    ACCU-CHEK AVIVA PLUS test strip USE DAILY OR UP TO 4 TIMES PER DAY AS DIRECTED). USE AS INSTRUCTED 200 strip PRN   ACCU-CHEK SOFTCLIX LANCETS lancets Use as instructed 100 each 12   AMBULATORY NON FORMULARY MEDICATION Accu-chek aviva plus test strips and accu-chek softclix lancets, use to check blood sugar up to four times a day. Dx Type 2 diabetes. 100 Units 3   atorvastatin (LIPITOR) 10 MG tablet Take 1 tablet (10 mg total) by mouth daily. 90 tablet 1   enalapril (VASOTEC) 5 MG tablet Take 1 tablet (5 mg total) by mouth daily. 90 tablet 1   metFORMIN (GLUCOPHAGE) 500 MG tablet TAKE 2 TABLETS BY MOUTH EVERY DAY WITH BREAKFAST (Patient taking differently: Take 1,000 mg by mouth daily with breakfast.) 180 tablet 3   Multiple Vitamin (MULTIVITAMIN WITH MINERALS) TABS tablet Take 1 tablet by mouth daily.     polyethylene glycol (MIRALAX) packet Take 17 g by mouth daily. 28 each 0   No current facility-administered medications for this visit.    REVIEW OF SYSTEMS:   10 Point review of Systems was done is negative except as noted above.  PHYSICAL EXAMINATION: ECOG PERFORMANCE STATUS: 0 - Asymptomatic  . Vitals:   05/18/22 1409  BP: (!) 123/58  Pulse: 87  Resp: 15  Temp: 98.1 F  (36.7 C)  SpO2: 96%    Filed Weights   05/18/22 1409  Weight: 150 lb 14.4 oz (68.4 kg)   .Body mass index is 22.94 kg/m.  NAD GENERAL:alert, in no acute distress and comfortable SKIN: no acute rashes, no significant lesions EYES: conjunctiva are pink and non-injected, sclera anicteric NECK: supple, no JVD LYMPH:  no palpable lymphadenopathy in the cervical, axillary or inguinal regions LUNGS: clear to auscultation b/l with normal respiratory effort HEART: regular rate & rhythm ABDOMEN:  normoactive bowel sounds , non tender, not distended.  No significant palpable splenomegaly. Extremity: no pedal edema PSYCH: alert & oriented x 3 with fluent speech NEURO: no focal  motor/sensory deficits  LABORATORY DATA:  I have reviewed the data as listed  .    Latest Ref Rng & Units 05/18/2022    1:47 PM 02/15/2022    1:00 PM 11/15/2021    1:45 PM  CBC  WBC 4.0 - 10.5 K/uL 164.0  158.4  139.2   Hemoglobin 13.0 - 17.0 g/dL 9.4  10.0  9.3   Hematocrit 39.0 - 52.0 % 29.3  31.3  30.2   Platelets 150 - 400 K/uL 129  146  147    . CBC    Component Value Date/Time   WBC 164.0 (HH) 05/18/2022 1347   WBC 139.2 (HH) 11/15/2021 1345   RBC 2.94 (L) 05/18/2022 1347   HGB 9.4 (L) 05/18/2022 1347   HGB 12.8 (L) 05/29/2017 1141   HCT 29.3 (L) 05/18/2022 1347   HCT 38.7 05/29/2017 1141   PLT 129 (L) 05/18/2022 1347   PLT 151 05/29/2017 1141   MCV 99.7 05/18/2022 1347   MCV 91.3 05/29/2017 1141   MCH 32.0 05/18/2022 1347   MCHC 32.1 05/18/2022 1347   RDW 15.3 05/18/2022 1347   RDW 13.8 05/29/2017 1141   LYMPHSABS 142.4 (H) 05/18/2022 1347   LYMPHSABS 8.8 (H) 05/29/2017 1141   MONOABS 13.3 (H) 05/18/2022 1347   MONOABS 0.7 05/29/2017 1141   EOSABS 0.3 05/18/2022 1347   EOSABS 0.1 05/29/2017 1141   BASOSABS 0.1 05/18/2022 1347   BASOSABS 0.0 05/29/2017 1141    .    Latest Ref Rng & Units 05/18/2022    1:47 PM 02/15/2022    1:00 PM 11/15/2021    1:45 PM  CMP  Glucose 70 - 99 mg/dL  100  101  140   BUN 8 - 23 mg/dL $Remove'27  19  21   'thqkWLd$ Creatinine 0.61 - 1.24 mg/dL 1.37  1.21  1.05   Sodium 135 - 145 mmol/L 142  141  145   Potassium 3.5 - 5.1 mmol/L 4.5  4.3  3.9   Chloride 98 - 111 mmol/L 112  107  111   CO2 22 - 32 mmol/L $RemoveB'22  26  26   'sTvhEkrX$ Calcium 8.9 - 10.3 mg/dL 9.4  9.8  9.5   Total Protein 6.5 - 8.1 g/dL 7.2  7.5  6.7   Total Bilirubin 0.3 - 1.2 mg/dL 0.7  0.7  0.5   Alkaline Phos 38 - 126 U/L 86  85  107   AST 15 - 41 U/L $Remo'27  21  17   'dzjSq$ ALT 0 - 44 U/L $Remo'18  15  13    'HHtMT$ 06/08/17 FISH CLL Prognostic Panel:    06/08/17 Cytogenetics:   05/29/17 Peripheral blood flow cytometry:    RADIOGRAPHIC STUDIES: I have personally reviewed the radiological images as listed and agreed with the findings in the report. No results found.  ASSESSMENT & PLAN:   81 y.o. male with  1. Chronic Lymphocytic Leukemia Rai 1  06/08/17 CLL FISH Prognostic panel negative for del17p, del11q, del13q, indicating intermediate risk 11/15/2019 CT C/A/P ([id number]) revealed gradual lymph node increase compared to 2018 CT. No major lymphoma progression. Mildly enlarged prostate.    PLAN: -Labs done today were discussed in detail with the patient. CBC shows gradually progressing WBC count.  Mild stable anemia with hemoglobin of 9.4 platelets of 129k CMP stable LDH is 162 Patient has no significant lab or clinical evidence of CLL progression at this time to suggest need for initiating treatment. Patient continues to remain keen to follow a  conservative approach.  2.  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 -Continue to monitor and manage per drains were dermatology  FOLLOW UP: RTC with Dr Irene Limbo with labs in 3 months  .The total time spent in the appointment was 20 minutes* .  All of the patient's questions were answered with apparent satisfaction. The patient knows to call the clinic with any problems, questions or concerns.   Sullivan Lone MD MS AAHIVMS [facility]  [facility] Hematology/Oncology Physician [facility]  .*Total Encounter Time as defined by the Centers for Medicare and Medicaid Services includes, in addition to the face-to-face time of a patient visit (documented in the note above) non-face-to-face time: obtaining and reviewing outside history, ordering and reviewing medications, tests or procedures, care coordination (communications with other health care professionals or caregivers) and documentation in the medical record.  I, Melene Muller, am acting as scribe for Dr. Sullivan Lone, MD.  .I have reviewed the above documentation for accuracy and completeness, and I agree with the above. [name] MD

## 2022-06-15 DIAGNOSIS — I10 Essential (primary) hypertension: Secondary | ICD-10-CM | POA: Diagnosis not present

## 2022-06-15 DIAGNOSIS — E1165 Type 2 diabetes mellitus with hyperglycemia: Secondary | ICD-10-CM | POA: Diagnosis not present

## 2022-06-15 DIAGNOSIS — E782 Mixed hyperlipidemia: Secondary | ICD-10-CM | POA: Diagnosis not present

## 2022-06-29 DIAGNOSIS — I739 Peripheral vascular disease, unspecified: Secondary | ICD-10-CM | POA: Diagnosis not present

## 2022-06-29 DIAGNOSIS — E1165 Type 2 diabetes mellitus with hyperglycemia: Secondary | ICD-10-CM | POA: Diagnosis not present

## 2022-06-29 DIAGNOSIS — I10 Essential (primary) hypertension: Secondary | ICD-10-CM | POA: Diagnosis not present

## 2022-06-29 DIAGNOSIS — E782 Mixed hyperlipidemia: Secondary | ICD-10-CM | POA: Diagnosis not present

## 2022-07-12 DIAGNOSIS — E1165 Type 2 diabetes mellitus with hyperglycemia: Secondary | ICD-10-CM | POA: Diagnosis not present

## 2022-07-12 DIAGNOSIS — E782 Mixed hyperlipidemia: Secondary | ICD-10-CM | POA: Diagnosis not present

## 2022-07-12 DIAGNOSIS — I739 Peripheral vascular disease, unspecified: Secondary | ICD-10-CM | POA: Diagnosis not present

## 2022-07-12 DIAGNOSIS — I10 Essential (primary) hypertension: Secondary | ICD-10-CM | POA: Diagnosis not present

## 2022-07-20 DIAGNOSIS — R6889 Other general symptoms and signs: Secondary | ICD-10-CM | POA: Diagnosis not present

## 2022-07-20 DIAGNOSIS — H43813 Vitreous degeneration, bilateral: Secondary | ICD-10-CM | POA: Diagnosis not present

## 2022-08-05 ENCOUNTER — Other Ambulatory Visit: Payer: Self-pay | Admitting: *Deleted

## 2022-08-05 DIAGNOSIS — M79606 Pain in leg, unspecified: Secondary | ICD-10-CM

## 2022-08-08 ENCOUNTER — Ambulatory Visit (HOSPITAL_COMMUNITY)
Admission: RE | Admit: 2022-08-08 | Discharge: 2022-08-08 | Disposition: A | Discharge status: Home / Self Care | Payer: Medicare HMO | Source: Ambulatory Visit | Attending: Vascular Surgery | Admitting: Vascular Surgery

## 2022-08-08 DIAGNOSIS — M79606 Pain in leg, unspecified: Secondary | ICD-10-CM | POA: Diagnosis not present

## 2022-08-08 NOTE — Progress Notes (Unsigned)
[facility] [place]  ASSESSMENT / PLAN: Tony Banks is a 81 y.o. male with bilateral lower extremity discomfort.  I think his symptoms are most easily explained by radiculopathy, as his pain is relieved not by rest, but by changing position.  He does have mild peripheral arterial disease by clinical exam and noninvasive testing.  Complete cessation from all tobacco products. Blood glucose control with goal A1c < 7%. Blood pressure control with goal blood pressure < 140/90 mmHg. Lipid reduction therapy with goal LDL-C <100 mg/dL (<70 if symptomatic from PAD).  Aspirin '81mg'$  PO QD.  Atorvastatin 40-'80mg'$  PO QD (or other "high intensity" statin therapy).  Will refer him back to primary care for workup of possible spinal stenosis.  I will see him again in 6 months to review things and repeating ABI.   CHIEF COMPLAINT: Lower extremity pain with walking or standing  HISTORY OF PRESENT ILLNESS: Tony Banks is a 81 y.o. male referred to clinic for evaluation of possible claudication type symptoms.  The patient reports he has significant discomfort in his lower extremities rating from his hips down his legs when he tries to mow his yard, walks for a prolonged period of time, or stands for long period of time.  He notes that the symptoms do not resolve with simply stopping walking, but he needs to sit down for them to get better.  He describes the pain as located around his hips and radiating down his thighs.  He does not notice the pain getting worse as he goes uphill or upstairs.  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  Past Medical History:  Diagnosis Date   Aortic atherosclerosis (HCC)    CLL (chronic lymphocytic leukemia) (HCC)    Constipation    severe   Diabetes mellitus without complication (HCC)    Diverticulosis    HLD (hyperlipidemia)    Hypertension    Nephrolithiasis     Past Surgical History:   Procedure Laterality Date   FINGER FRACTURE SURGERY Right    middle finger   SHOULDER ARTHROSCOPY Bilateral    VASECTOMY      Family History  Problem Relation Age of Onset   Heart disease Mother    Stroke Mother    Cancer Father    Stroke Maternal Grandfather    Colon cancer Neg Hx    Pancreatic cancer Neg Hx    Rectal cancer Neg Hx    Stomach cancer Neg Hx     Social History   Socioeconomic History   Marital status: Married    Spouse name: Tony Banks   Number of children: 3   Years of education: 14   Highest education level: Some college, no degree  Occupational History   Occupation: retired  Tobacco Use   Smoking status: Former    Types: Cigarettes    Quit date: 07/31/1977    Years since quitting: 45.0   Smokeless tobacco: Never   Tobacco comments:    quit 30 yrs ago  Vaping Use   Vaping Use: Never used  Substance and Sexual Activity   Alcohol use: No   Drug use: No   Sexual activity: Not Currently    Birth control/protection: Surgical  Other Topics Concern   Not on file  Social History Narrative   Drinks 1-2 cups of coffee. Enjoys reading, watching tv and likes to ride his stationary bike.    Social Determinants of  Health   Financial Resource Strain: Low Risk  (10/12/2020)   Overall Financial Resource Strain (CARDIA)    Difficulty of Paying Living Expenses: Not hard at all  Food Insecurity: No Food Insecurity (10/12/2020)   Hunger Vital Sign    Worried About Running Out of Food in the Last Year: Never true    Ran Out of Food in the Last Year: Never true  Transportation Needs: No Transportation Needs (10/12/2020)   PRAPARE - Hydrologist (Medical): No    Lack of Transportation (Non-Medical): No  Physical Activity: Sufficiently Active (10/12/2020)   Exercise Vital Sign    Days of Exercise per Week: 7 days    Minutes of Exercise per Session: 80 min  Stress: No Stress Concern Present (10/12/2020)   Tony Banks    Feeling of Stress : Not at all  Social Connections: Moderately Isolated (10/12/2020)   Social Connection and Isolation Panel [NHANES]    Frequency of Communication with Friends and Family: Once a week    Frequency of Social Gatherings with Friends and Family: Once a week    Attends Religious Services: More than 4 times per year    Active Member of [employer] or Organizations: No    Attends Archivist Meetings: Never    Marital Status: Married  Human resources officer Violence: Not At Risk (10/12/2020)   Humiliation, Afraid, Rape, and Kick questionnaire    Fear of Current or Ex-Partner: No    Emotionally Abused: No    Physically Abused: No    Sexually Abused: No    No Known Allergies  Current Outpatient Medications  Medication Sig Dispense Refill   ACCU-CHEK AVIVA PLUS test strip USE DAILY OR UP TO 4 TIMES PER DAY AS DIRECTED). USE AS INSTRUCTED 200 strip PRN   ACCU-CHEK SOFTCLIX LANCETS lancets Use as instructed 100 each 12   AMBULATORY NON FORMULARY MEDICATION Accu-chek aviva plus test strips and accu-chek softclix lancets, use to check blood sugar up to four times a day. Dx Type 2 diabetes. 100 Units 3   atorvastatin (LIPITOR) 10 MG tablet Take 1 tablet (10 mg total) by mouth daily. 90 tablet 1   enalapril (VASOTEC) 5 MG tablet Take 1 tablet (5 mg total) by mouth daily. 90 tablet 1   Ferrous Sulfate (SLOW FE) 142 (45 Fe) MG TBCR Take 1 tablet by mouth daily. 30 tablet    magnesium 30 MG tablet Take 30 mg by mouth 2 (two) times daily.     metFORMIN (GLUCOPHAGE) 500 MG tablet TAKE 2 TABLETS BY MOUTH EVERY DAY WITH BREAKFAST (Patient taking differently: Take 1,000 mg by mouth daily with breakfast.) 180 tablet 3   Multiple Vitamin (MULTIVITAMIN WITH MINERALS) TABS tablet Take 1 tablet by mouth daily.     polyethylene glycol (MIRALAX) packet Take 17 g by mouth daily. 28 each 0   No current facility-administered medications for this visit.     PHYSICAL EXAM Vitals:   08/09/22 0828  BP: (!) 151/65  Pulse: 77  Resp: 20  Temp: 98.7 F (37.1 C)  SpO2: 100%  Weight: 148 lb (67.1 kg)  Height: '5\' 8"'$  (1.727 m)    Elderly man in no acute distress Regular rate and rhythm Unlabored breathing No palpable pedal pulses No ulcers about his feet   PERTINENT LABORATORY AND RADIOLOGIC DATA  Most recent CBC    Latest Ref Rng & Units 05/18/2022    1:47 PM 02/15/2022  1:00 PM 11/15/2021    1:45 PM  CBC  WBC 4.0 - 10.5 K/uL 164.0  158.4  139.2   Hemoglobin 13.0 - 17.0 g/dL 9.4  10.0  9.3   Hematocrit 39.0 - 52.0 % 29.3  31.3  30.2   Platelets 150 - 400 K/uL 129  146  147      Most recent CMP    Latest Ref Rng & Units 05/18/2022    1:47 PM 02/15/2022    1:00 PM 11/15/2021    1:45 PM  CMP  Glucose 70 - 99 mg/dL 100  101  140   BUN 8 - 23 mg/dL '27  19  21   '$ Creatinine 0.61 - 1.24 mg/dL 1.37  1.21  1.05   Sodium 135 - 145 mmol/L 142  141  145   Potassium 3.5 - 5.1 mmol/L 4.5  4.3  3.9   Chloride 98 - 111 mmol/L 112  107  111   CO2 22 - 32 mmol/L '22  26  26   '$ Calcium 8.9 - 10.3 mg/dL 9.4  9.8  9.5   Total Protein 6.5 - 8.1 g/dL 7.2  7.5  6.7   Total Bilirubin 0.3 - 1.2 mg/dL 0.7  0.7  0.5   Alkaline Phos 38 - 126 U/L 86  85  107   AST 15 - 41 U/L '27  21  17   '$ ALT 0 - 44 U/L '18  15  13     '$ Renal function CrCl cannot be calculated (Patient's most recent lab result is older than the maximum 21 days allowed.).  Hemoglobin A1C (%)  Date Value  02/12/2021 6.5 (A)   HbA1c, POC (prediabetic range) (%)  Date Value  03/24/2020 5.6 (A)   HbA1c, POC (controlled diabetic range) (%)  Date Value  03/24/2020 5.6   HbA1c POC (<> result, manual entry) (%)  Date Value  03/24/2020 5.6   Hgb A1c MFr Bld (% of total Hgb)  Date Value  03/24/2020 6.9 (H)    LDL Cholesterol (Calc)  Date Value Ref Range Status  11/02/2020 64 mg/dL (calc) Final    Comment:    Reference range: <100 . Desirable range <100 mg/dL for primary  prevention;   <70 mg/dL for patients with CHD or diabetic patients  with > or = 2 CHD risk factors. Marland Kitchen LDL-C is now calculated using the Martin-Hopkins  calculation, which is a validated novel method providing  better accuracy than the Friedewald equation in the  estimation of LDL-C.  Cresenciano Genre et al. Annamaria Helling. 7622;633(35): 2061-2068  (http://education.[employer].com/faq/FAQ164)       +-------+-----------+-----------+------------+------------+  ABI/TBIToday's ABIToday's TBIPrevious ABIPrevious TBI  +-------+-----------+-----------+------------+------------+  Right 0.76       0.53                                 +-------+-----------+-----------+------------+------------+  Left  0.84       0.67                                 +-------+-----------+-----------+------------+------------+    Yevonne Aline. Stanford Breed, MD [name] Phone Number: [phone] 08/09/2022 12:20 PM   Total time spent on preparing this encounter including chart review, data review, collecting history, examining the patient, coordinating care for this new patient, 45 minutes.  Portions of this report may have  been transcribed using voice recognition software.  Every effort has been made to ensure accuracy; however, inadvertent computerized transcription errors may still be present.

## 2022-08-09 ENCOUNTER — Ambulatory Visit: Payer: Medicare HMO | Admitting: Vascular Surgery

## 2022-08-09 ENCOUNTER — Encounter: Payer: Self-pay | Admitting: Vascular Surgery

## 2022-08-09 VITALS — BP 151/65 | HR 77 | Temp 98.7°F | Resp 20 | Ht 68.0 in | Wt 148.0 lb

## 2022-08-09 DIAGNOSIS — I739 Peripheral vascular disease, unspecified: Secondary | ICD-10-CM

## 2022-08-15 ENCOUNTER — Other Ambulatory Visit: Payer: Self-pay

## 2022-08-15 DIAGNOSIS — I739 Peripheral vascular disease, unspecified: Secondary | ICD-10-CM

## 2022-08-16 ENCOUNTER — Other Ambulatory Visit: Payer: Self-pay

## 2022-08-16 DIAGNOSIS — C9110 Chronic lymphocytic leukemia of B-cell type not having achieved remission: Secondary | ICD-10-CM

## 2022-08-17 ENCOUNTER — Inpatient Hospital Stay (HOSPITAL_BASED_OUTPATIENT_CLINIC_OR_DEPARTMENT_OTHER): Payer: Medicare HMO | Admitting: Hematology

## 2022-08-17 ENCOUNTER — Inpatient Hospital Stay: Payer: Medicare HMO | Attending: Hematology

## 2022-08-17 VITALS — BP 109/55 | HR 73 | Temp 97.9°F | Resp 18 | Ht 68.0 in | Wt 147.5 lb

## 2022-08-17 DIAGNOSIS — E785 Hyperlipidemia, unspecified: Secondary | ICD-10-CM | POA: Diagnosis not present

## 2022-08-17 DIAGNOSIS — I739 Peripheral vascular disease, unspecified: Secondary | ICD-10-CM | POA: Insufficient documentation

## 2022-08-17 DIAGNOSIS — C9110 Chronic lymphocytic leukemia of B-cell type not having achieved remission: Secondary | ICD-10-CM

## 2022-08-17 DIAGNOSIS — I10 Essential (primary) hypertension: Secondary | ICD-10-CM | POA: Diagnosis not present

## 2022-08-17 DIAGNOSIS — I700 Atherosclerosis of aorta: Secondary | ICD-10-CM | POA: Diagnosis not present

## 2022-08-17 DIAGNOSIS — D649 Anemia, unspecified: Secondary | ICD-10-CM | POA: Diagnosis not present

## 2022-08-17 DIAGNOSIS — K5900 Constipation, unspecified: Secondary | ICD-10-CM | POA: Diagnosis not present

## 2022-08-17 DIAGNOSIS — Z87442 Personal history of urinary calculi: Secondary | ICD-10-CM | POA: Insufficient documentation

## 2022-08-17 DIAGNOSIS — E119 Type 2 diabetes mellitus without complications: Secondary | ICD-10-CM | POA: Insufficient documentation

## 2022-08-17 LAB — CMP (CANCER CENTER ONLY)
ALT: 15 U/L (ref 0–44)
AST: 19 U/L (ref 15–41)
Albumin: 4.7 g/dL (ref 3.5–5.0)
Alkaline Phosphatase: 98 U/L (ref 38–126)
Anion gap: 5 (ref 5–15)
BUN: 29 mg/dL — ABNORMAL HIGH (ref 8–23)
CO2: 28 mmol/L (ref 22–32)
Calcium: 9.8 mg/dL (ref 8.9–10.3)
Chloride: 108 mmol/L (ref 98–111)
Creatinine: 1.22 mg/dL (ref 0.61–1.24)
GFR, Estimated: 60 mL/min — ABNORMAL LOW (ref >60)
Glucose, Bld: 119 mg/dL — ABNORMAL HIGH (ref 70–99)
Potassium: 5.1 mmol/L (ref 3.5–5.1)
Sodium: 141 mmol/L (ref 135–145)
Total Bilirubin: 0.6 mg/dL (ref 0.3–1.2)
Total Protein: 7 g/dL (ref 6.5–8.1)

## 2022-08-17 LAB — CBC WITH DIFFERENTIAL (CANCER CENTER ONLY)
Abs Immature Granulocytes: 0.75 10*3/uL — ABNORMAL HIGH (ref 0.00–0.07)
Basophils Absolute: 0.1 10*3/uL (ref 0.0–0.1)
Basophils Relative: 0 %
Eosinophils Absolute: 0.3 10*3/uL (ref 0.0–0.5)
Eosinophils Relative: 0 %
HCT: 30.1 % — ABNORMAL LOW (ref 39.0–52.0)
Hemoglobin: 9.4 g/dL — ABNORMAL LOW (ref 13.0–17.0)
Immature Granulocytes: 0 %
Lymphocytes Relative: 92 %
Lymphs Abs: 199.2 10*3/uL — ABNORMAL HIGH (ref 0.7–4.0)
MCH: 31.8 pg (ref 26.0–34.0)
MCHC: 31.2 g/dL (ref 30.0–36.0)
MCV: 101.7 fL — ABNORMAL HIGH (ref 80.0–100.0)
Monocytes Absolute: 10.2 10*3/uL — ABNORMAL HIGH (ref 0.1–1.0)
Monocytes Relative: 5 %
Neutro Abs: 5.3 10*3/uL (ref 1.7–7.7)
Neutrophils Relative %: 3 %
Platelet Count: 137 10*3/uL — ABNORMAL LOW (ref 150–400)
RBC: 2.96 MIL/uL — ABNORMAL LOW (ref 4.22–5.81)
RDW: 15.4 % (ref 11.5–15.5)
Smear Review: NORMAL
WBC Count: 215.8 10*3/uL (ref 4.0–10.5)
nRBC: 0 % (ref 0.0–0.2)

## 2022-08-17 LAB — LACTATE DEHYDROGENASE: LDH: 161 U/L (ref 98–192)

## 2022-08-17 NOTE — Progress Notes (Signed)
CRITICAL VALUE STICKER  CRITICAL VALUE: WBC 215.8  RECEIVER (on-site recipient of call): Velna Ochs RN  DATE & TIME NOTIFIED: 08/17/22 @ 1144  MESSENGER (representative from lab): Heather  MD NOTIFIED: Dr Irene Limbo  TIME OF NOTIFICATION: 1117  RESPONSE:  MD aware

## 2022-08-17 NOTE — Progress Notes (Signed)
HEMATOLOGY/ONCOLOGY CLINIC NOTE  Date of Service: 08/17/22   Patient Care Team: [name], MD as PCP - General (Internal Medicine) [name], [facility] as Pharmacist (Pharmacist) [name], [name], RN as Oncology Nurse Navigator [name], MD as Consulting Physician (Radiation Oncology) [name], MD as Consulting Physician (Hematology) [name], MD as Referring Physician (Dermatology)  CHIEF COMPLAINTS/PURPOSE OF CONSULTATION:  Follow-up for continued evaluation and management of CLL  HISTORY OF PRESENTING ILLNESS:  Please see previous note for details of initial presentation  INTERVAL HISTORY:  Tony Banks is a 81 y.o. 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   Patient was last seen by me on 05/18/2022 and patient was doing well overall.   Patient reports he is doing well without any new medical concerns during this visit. He reports he has PAD, but it is not severe. He complains of consistent bilateral leg pain and cramps, which he saw his PCP for it and has been referred to spine specialist.   He reports he fell around 1 week ago and hurt his left knee, not severe. He is staying physically active since our last visit. Patient notes he was having back pain, but somehow it is better after his recent fall.   He denies fever, chills, night sweats, unexpected weight loss, low energy, abnormal bleeding, abnormal bowl moment, infection issues, abdominal pain, back pain, new lumps/bumps, or leg swelling.  Patient has received the influenza vaccine, COVID-19 Booster, Pneumonia vaccine. Denies RSV vaccine and Shingrex vaccine.   He is regularly taking iron supplements twice a day and multi-vitamin.   Patient follows-up with his dermatologist every 6 months.    MEDICAL HISTORY:  Past Medical History:  Diagnosis Date   Aortic atherosclerosis (HCC)    CLL (chronic lymphocytic leukemia) (HCC)    Constipation    severe    Diabetes mellitus without complication (HCC)    Diverticulosis    HLD (hyperlipidemia)    Hypertension    Nephrolithiasis     SURGICAL HISTORY: Past Surgical History:  Procedure Laterality Date   FINGER FRACTURE SURGERY Right    middle finger   SHOULDER ARTHROSCOPY Bilateral    VASECTOMY      SOCIAL HISTORY: Social History   Socioeconomic History   Marital status: Married    Spouse name: [place]   Number of children: 3   Years of education: 14   Highest education level: Some college, no degree  Occupational History   Occupation: retired  Tobacco Use   Smoking status: Former    Types: Cigarettes    Quit date: 07/31/1977    Years since quitting: 45.0   Smokeless tobacco: Never   Tobacco comments:    quit 30 yrs ago  Vaping Use   Vaping Use: Never used  Substance and Sexual Activity   Alcohol use: No   Drug use: No   Sexual activity: Not Currently    Birth control/protection: Surgical  Other Topics Concern   Not on file  Social History Narrative   Drinks 1-2 cups of coffee. Enjoys reading, watching tv and likes to ride his stationary bike.    Social Determinants of Health   Financial Resource Strain: Low Risk  (10/12/2020)   Overall Financial Resource Strain (CARDIA)    Difficulty of Paying Living Expenses: Not hard at all  Food Insecurity: No Food Insecurity (10/12/2020)   Hunger Vital Sign    Worried About Running Out of Food in the Last  Year: Never true    [place] in the Last Year: Never true  Transportation Needs: No Transportation Needs (10/12/2020)   PRAPARE - Hydrologist (Medical): No    Lack of Transportation (Non-Medical): No  Physical Activity: Sufficiently Active (10/12/2020)   Exercise Vital Sign    Days of Exercise per Week: 7 days    Minutes of Exercise per Session: 80 min  Stress: No Stress Concern Present (10/12/2020)   [place]    Feeling of  Stress : Not at all  Social Connections: Moderately Isolated (10/12/2020)   Social Connection and Isolation Panel [NHANES]    Frequency of Communication with Friends and Family: Once a week    Frequency of Social Gatherings with Friends and Family: Once a week    Attends Religious Services: More than 4 times per year    Active Member of [employer] or Organizations: No    Attends Archivist Meetings: Never    Marital Status: Married  Human resources officer Violence: Not At Risk (10/12/2020)   Humiliation, Afraid, Rape, and Kick questionnaire    Fear of Current or Ex-Partner: No    Emotionally Abused: No    Physically Abused: No    Sexually Abused: No    FAMILY HISTORY: Family History  Problem Relation Age of Onset   Heart disease Mother    Stroke Mother    Cancer Father    Stroke Maternal Grandfather    Colon cancer Neg Hx    Pancreatic cancer Neg Hx    Rectal cancer Neg Hx    Stomach cancer Neg Hx     ALLERGIES:  has No Known Allergies.  MEDICATIONS:  Current Outpatient Medications  Medication Sig Dispense Refill   ACCU-CHEK AVIVA PLUS test strip USE DAILY OR UP TO 4 TIMES PER DAY AS DIRECTED). USE AS INSTRUCTED 200 strip PRN   ACCU-CHEK SOFTCLIX LANCETS lancets Use as instructed 100 each 12   AMBULATORY NON FORMULARY MEDICATION Accu-chek aviva plus test strips and accu-chek softclix lancets, use to check blood sugar up to four times a day. Dx Type 2 diabetes. 100 Units 3   atorvastatin (LIPITOR) 10 MG tablet Take 1 tablet (10 mg total) by mouth daily. 90 tablet 1   enalapril (VASOTEC) 5 MG tablet Take 1 tablet (5 mg total) by mouth daily. 90 tablet 1   Ferrous Sulfate (SLOW FE) 142 (45 Fe) MG TBCR Take 1 tablet by mouth daily. 30 tablet    magnesium 30 MG tablet Take 30 mg by mouth 2 (two) times daily.     metFORMIN (GLUCOPHAGE) 500 MG tablet TAKE 2 TABLETS BY MOUTH EVERY DAY WITH BREAKFAST (Patient taking differently: Take 1,000 mg by mouth daily with breakfast.) 180 tablet 3    Multiple Vitamin (MULTIVITAMIN WITH MINERALS) TABS tablet Take 1 tablet by mouth daily.     polyethylene glycol (MIRALAX) packet Take 17 g by mouth daily. 28 each 0   No current facility-administered medications for this visit.    REVIEW OF SYSTEMS:   10 Point review of Systems was done is negative except as noted above.  PHYSICAL EXAMINATION: ECOG PERFORMANCE STATUS: 0 - Asymptomatic  . Vitals:   08/17/22 1210  BP: (!) 109/55  Pulse: 73  Resp: 18  Temp: 97.9 F (36.6 C)  SpO2: 100%   Filed Weights   08/17/22 1210  Weight: 147 lb 8 oz (66.9 kg)    .Body  mass index is 22.43 kg/m.  NAD GENERAL:alert, in no acute distress and comfortable SKIN: no acute rashes, no significant lesions EYES: conjunctiva are pink and non-injected, sclera anicteric NECK: supple, no JVD LYMPH:  no palpable lymphadenopathy in the cervical, axillary or inguinal regions LUNGS: clear to auscultation b/l with normal respiratory effort HEART: regular rate & rhythm ABDOMEN:  normoactive bowel sounds , non tender, not distended.  No significant palpable splenomegaly. Extremity: no pedal edema PSYCH: alert & oriented x 3 with fluent speech NEURO: no focal motor/sensory deficits  LABORATORY DATA:  I have reviewed the data as listed  .    Latest Ref Rng & Units 08/17/2022   11:28 AM 05/18/2022    1:47 PM 02/15/2022    1:00 PM  CBC  WBC 4.0 - 10.5 K/uL 215.8  164.0  158.4   Hemoglobin 13.0 - 17.0 g/dL 9.4  9.4  10.0   Hematocrit 39.0 - 52.0 % 30.1  29.3  31.3   Platelets 150 - 400 K/uL 137  129  146    . CBC    Component Value Date/Time   WBC 164.0 (HH) 05/18/2022 1347   WBC 139.2 (HH) 11/15/2021 1345   RBC 2.94 (L) 05/18/2022 1347   HGB 9.4 (L) 05/18/2022 1347   HGB 12.8 (L) 05/29/2017 1141   HCT 29.3 (L) 05/18/2022 1347   HCT 38.7 05/29/2017 1141   PLT 129 (L) 05/18/2022 1347   PLT 151 05/29/2017 1141   MCV 99.7 05/18/2022 1347   MCV 91.3 05/29/2017 1141   MCH 32.0 05/18/2022  1347   MCHC 32.1 05/18/2022 1347   RDW 15.3 05/18/2022 1347   RDW 13.8 05/29/2017 1141   LYMPHSABS 142.4 (H) 05/18/2022 1347   LYMPHSABS 8.8 (H) 05/29/2017 1141   MONOABS 13.3 (H) 05/18/2022 1347   MONOABS 0.7 05/29/2017 1141   EOSABS 0.3 05/18/2022 1347   EOSABS 0.1 05/29/2017 1141   BASOSABS 0.1 05/18/2022 1347   BASOSABS 0.0 05/29/2017 1141    .    Latest Ref Rng & Units 08/17/2022   11:28 AM 05/18/2022    1:47 PM 02/15/2022    1:00 PM  CMP  Glucose 70 - 99 mg/dL 119  100  101   BUN 8 - 23 mg/dL _0 Creatinine 0.61 - 1.24 mg/dL 1.22  1.37  1.21   Sodium 135 - 145 mmol/L 141  142  141   Potassium 3.5 - 5.1 mmol/L 5.1  4.5  4.3   Chloride 98 - 111 mmol/L 108  112  107   CO2 22 - 32 mmol/L _1 Calcium 8.9 - 10.3 mg/dL 9.8  9.4  9.8   Total Protein 6.5 - 8.1 g/dL 7.0  7.2  7.5   Total Bilirubin 0.3 - 1.2 mg/dL 0.6  0.7  0.7   Alkaline Phos 38 - 126 U/L 98  86  85   AST 15 - 41 U/L _2 ALT 0 - 44 U/L _3 06/08/17 FISH CLL Prognostic Panel:    06/08/17 Cytogenetics:   05/29/17 Peripheral blood flow cytometry:    RADIOGRAPHIC STUDIES: I have personally reviewed the radiological images as listed and agreed with the findings in the report. VAS [place] ABI WITH/WO TBI  Result Date: 08/08/2022  LOWER EXTREMITY DOPPLER STUDY Patient Name:  Tony Banks  Date of Exam:   08/08/2022 Medical Rec #:  [id number]     Accession #:    [id number] Date of Birth: May 19, 1941     Patient Gender: M Patient Age:   54 years Exam Location:  [facility] Procedure:      VAS [place] ABI WITH/WO TBI Referring Phys: --------------------------------------------------------------------------------  Indications: Claudication with 25 yards of ambulation. High Risk Factors: Diabetes, current smoker.  Performing Technologist: Ronal Fear RVS, RCS  Examination Guidelines: A complete evaluation includes at minimum, Doppler waveform signals and systolic blood pressure  reading at the level of bilateral brachial, anterior tibial, and posterior tibial arteries, when vessel segments are accessible. Bilateral testing is considered an integral part of a complete examination. Photoelectric Plethysmograph (PPG) waveforms and toe systolic pressure readings are included as required and additional duplex testing as needed. Limited examinations for reoccurring indications may be performed as noted.  ABI Findings: +---------+------------------+-----+--------+--------+ Right    Rt Pressure (mmHg)IndexWaveformComment  +---------+------------------+-----+--------+--------+ Brachial 145                                     +---------+------------------+-----+--------+--------+ PTA                             absent           +---------+------------------+-----+--------+--------+ DP       113               0.76 biphasic         +---------+------------------+-----+--------+--------+ Great Toe79                0.53                  +---------+------------------+-----+--------+--------+ +---------+------------------+-----+--------+-------+ Left     Lt Pressure (mmHg)IndexWaveformComment +---------+------------------+-----+--------+-------+ Brachial 148                                    +---------+------------------+-----+--------+-------+ PTA                             absent          +---------+------------------+-----+--------+-------+ DP       124               0.84 biphasic        +---------+------------------+-----+--------+-------+ Great Toe99                0.67                 +---------+------------------+-----+--------+-------+ +-------+-----------+-----------+------------+------------+ ABI/TBIToday's ABIToday's TBIPrevious ABIPrevious TBI +-------+-----------+-----------+------------+------------+ Right  0.76       0.53                                +-------+-----------+-----------+------------+------------+ Left    0.84       0.67                                +-------+-----------+-----------+------------+------------+   Summary: Right: Resting right ankle-brachial index 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 The right toe-brachial index is abnormal. Left: Resting left ankle-brachial index indicates mild left lower extremity arterial disease. The left toe-brachial index is abnormal. *See table(s) above for measurements and  observations.  Electronically signed by Harold Barban MD on 08/08/2022 at 7:44:54 PM.    Final     ASSESSMENT & PLAN:   81 y.o. male with  1. Chronic Lymphocytic Leukemia Rai 1  06/08/17 CLL FISH Prognostic panel negative for del17p, del11q, del13q, indicating intermediate risk 11/15/2019 CT C/A/P ([id number]) revealed gradual lymph node increase compared to 2018 CT. No major lymphoma progression. Mildly enlarged prostate.    2.  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 -Continue to monitor and manage per dermatology  PLAN: -Discussed lab results from today with the patient. CBC shows elevated WBC of 215.8 K, decreased hemoglobin of 9.4, and platelets of 137. CMP shows elevated blood glucose to 119K. -discussed the need to start treatment for CLL due to his lab results. Discussed the two treatment options: 1. CLL oral medication. 2. Medication plus antibody infusion. -we discussed progression of CLL and that his anemia is likely related to CLL. We discussed that we might need to start considering treatment options. -patient notes he is feeling well and would like to monitor and hold off on treatment consideration at this time. -Recommend RSV vaccine and Shingrex vaccine.  -Cut down iron supplement from twice a day to once a day.  -CT scan before the next visit.   FOLLOW-UP: RTC with Reniah Cottingham with labs in 3 months CT chest/abd/pelvis in 11 week  The total time spent in the appointment was 25 minutes* .  All of the patient's  questions were answered with apparent satisfaction. The patient knows to call the clinic with any problems, questions or concerns.   Sullivan Lone MD MS AAHIVMS [facility] Hematology/Oncology Physician [facility]  .*Total Encounter Time as defined by the Centers for Medicare and Medicaid Services includes, in addition to the face-to-face time of a patient visit (documented in the note above) non-face-to-face time: obtaining and reviewing outside history, ordering and reviewing medications, tests or procedures, care coordination (communications with other health care professionals or caregivers) and documentation in the medical record.   I, Cleda Mccreedy, am acting as a Education administrator for Sullivan Lone, MD. .I have reviewed the above documentation for accuracy and completeness, and I agree with the above. [name] MD

## 2022-09-14 DIAGNOSIS — M5442 Lumbago with sciatica, left side: Secondary | ICD-10-CM | POA: Diagnosis not present

## 2022-09-14 DIAGNOSIS — Z131 Encounter for screening for diabetes mellitus: Secondary | ICD-10-CM | POA: Diagnosis not present

## 2022-09-14 DIAGNOSIS — E1165 Type 2 diabetes mellitus with hyperglycemia: Secondary | ICD-10-CM | POA: Diagnosis not present

## 2022-09-14 DIAGNOSIS — I10 Essential (primary) hypertension: Secondary | ICD-10-CM | POA: Diagnosis not present

## 2022-09-14 DIAGNOSIS — R7309 Other abnormal glucose: Secondary | ICD-10-CM | POA: Diagnosis not present

## 2022-09-14 DIAGNOSIS — Z1329 Encounter for screening for other suspected endocrine disorder: Secondary | ICD-10-CM | POA: Diagnosis not present

## 2022-09-14 DIAGNOSIS — Z0001 Encounter for general adult medical examination with abnormal findings: Secondary | ICD-10-CM | POA: Diagnosis not present

## 2022-09-14 DIAGNOSIS — R5383 Other fatigue: Secondary | ICD-10-CM | POA: Diagnosis not present

## 2022-09-14 DIAGNOSIS — M5441 Lumbago with sciatica, right side: Secondary | ICD-10-CM | POA: Diagnosis not present

## 2022-09-14 DIAGNOSIS — E139 Other specified diabetes mellitus without complications: Secondary | ICD-10-CM | POA: Diagnosis not present

## 2022-09-14 DIAGNOSIS — Z136 Encounter for screening for cardiovascular disorders: Secondary | ICD-10-CM | POA: Diagnosis not present

## 2022-09-14 DIAGNOSIS — Z125 Encounter for screening for malignant neoplasm of prostate: Secondary | ICD-10-CM | POA: Diagnosis not present

## 2022-09-16 DIAGNOSIS — C9190 Lymphoid leukemia, unspecified not having achieved remission: Secondary | ICD-10-CM | POA: Diagnosis not present

## 2022-09-16 DIAGNOSIS — C9110 Chronic lymphocytic leukemia of B-cell type not having achieved remission: Secondary | ICD-10-CM | POA: Diagnosis not present

## 2022-09-16 DIAGNOSIS — E875 Hyperkalemia: Secondary | ICD-10-CM | POA: Diagnosis not present

## 2022-09-16 DIAGNOSIS — E119 Type 2 diabetes mellitus without complications: Secondary | ICD-10-CM | POA: Diagnosis not present

## 2022-09-16 DIAGNOSIS — J439 Emphysema, unspecified: Secondary | ICD-10-CM | POA: Diagnosis not present

## 2022-10-12 DIAGNOSIS — E782 Mixed hyperlipidemia: Secondary | ICD-10-CM | POA: Diagnosis not present

## 2022-10-12 DIAGNOSIS — I10 Essential (primary) hypertension: Secondary | ICD-10-CM | POA: Diagnosis not present

## 2022-10-12 DIAGNOSIS — E1152 Type 2 diabetes mellitus with diabetic peripheral angiopathy with gangrene: Secondary | ICD-10-CM | POA: Diagnosis not present

## 2022-11-02 ENCOUNTER — Encounter (HOSPITAL_COMMUNITY): Payer: Self-pay

## 2022-11-02 ENCOUNTER — Ambulatory Visit (HOSPITAL_COMMUNITY)
Admission: RE | Admit: 2022-11-02 | Discharge: 2022-11-02 | Disposition: A | Discharge status: Home / Self Care | Payer: Medicare HMO | Source: Ambulatory Visit | Attending: Hematology | Admitting: Hematology

## 2022-11-02 DIAGNOSIS — D649 Anemia, unspecified: Secondary | ICD-10-CM | POA: Diagnosis not present

## 2022-11-02 DIAGNOSIS — C4492 Squamous cell carcinoma of skin, unspecified: Secondary | ICD-10-CM | POA: Diagnosis not present

## 2022-11-02 DIAGNOSIS — C9110 Chronic lymphocytic leukemia of B-cell type not having achieved remission: Secondary | ICD-10-CM | POA: Diagnosis not present

## 2022-11-02 DIAGNOSIS — N200 Calculus of kidney: Secondary | ICD-10-CM | POA: Diagnosis not present

## 2022-11-02 DIAGNOSIS — J432 Centrilobular emphysema: Secondary | ICD-10-CM | POA: Diagnosis not present

## 2022-11-02 LAB — POCT I-STAT CREATININE: Creatinine, Ser: 1.4 mg/dL — ABNORMAL HIGH (ref 0.61–1.24)

## 2022-11-02 MED ORDER — IOHEXOL 300 MG/ML  SOLN
100.0000 mL | Freq: Once | INTRAMUSCULAR | Status: AC | PRN
  Administered 2022-11-02: 100 mL via INTRAVENOUS

## 2022-11-02 MED ORDER — SODIUM CHLORIDE (PF) 0.9 % IJ SOLN
INTRAMUSCULAR | Status: AC
  Filled 2022-11-02: qty 50

## 2022-11-15 ENCOUNTER — Other Ambulatory Visit: Payer: Self-pay

## 2022-11-15 DIAGNOSIS — C9110 Chronic lymphocytic leukemia of B-cell type not having achieved remission: Secondary | ICD-10-CM

## 2022-11-16 ENCOUNTER — Inpatient Hospital Stay: Payer: Medicare HMO | Attending: Hematology

## 2022-11-16 ENCOUNTER — Inpatient Hospital Stay (HOSPITAL_BASED_OUTPATIENT_CLINIC_OR_DEPARTMENT_OTHER): Payer: Medicare HMO | Admitting: Hematology

## 2022-11-16 VITALS — BP 114/52 | HR 94 | Temp 97.9°F | Resp 18 | Wt 144.1 lb

## 2022-11-16 DIAGNOSIS — C9110 Chronic lymphocytic leukemia of B-cell type not having achieved remission: Secondary | ICD-10-CM | POA: Diagnosis not present

## 2022-11-16 DIAGNOSIS — D649 Anemia, unspecified: Secondary | ICD-10-CM | POA: Diagnosis not present

## 2022-11-16 LAB — CBC WITH DIFFERENTIAL (CANCER CENTER ONLY)
Abs Immature Granulocytes: 0.79 10*3/uL — ABNORMAL HIGH (ref 0.00–0.07)
Basophils Absolute: 0.1 10*3/uL (ref 0.0–0.1)
Basophils Relative: 0 %
Eosinophils Absolute: 0.3 10*3/uL (ref 0.0–0.5)
Eosinophils Relative: 0 %
HCT: 29.7 % — ABNORMAL LOW (ref 39.0–52.0)
Hemoglobin: 9.2 g/dL — ABNORMAL LOW (ref 13.0–17.0)
Immature Granulocytes: 0 %
Lymphocytes Relative: 89 %
Lymphs Abs: 186.5 10*3/uL — ABNORMAL HIGH (ref 0.7–4.0)
MCH: 31.4 pg (ref 26.0–34.0)
MCHC: 31 g/dL (ref 30.0–36.0)
MCV: 101.4 fL — ABNORMAL HIGH (ref 80.0–100.0)
Monocytes Absolute: 16.3 10*3/uL — ABNORMAL HIGH (ref 0.1–1.0)
Monocytes Relative: 8 %
Neutro Abs: 6.1 10*3/uL (ref 1.7–7.7)
Neutrophils Relative %: 3 %
Platelet Count: 137 10*3/uL — ABNORMAL LOW (ref 150–400)
RBC: 2.93 MIL/uL — ABNORMAL LOW (ref 4.22–5.81)
RDW: 15.3 % (ref 11.5–15.5)
Smear Review: NORMAL
WBC Count: 208.9 10*3/uL (ref 4.0–10.5)
nRBC: 0 % (ref 0.0–0.2)

## 2022-11-16 LAB — CMP (CANCER CENTER ONLY)
ALT: 14 U/L (ref 0–44)
AST: 23 U/L (ref 15–41)
Albumin: 4.7 g/dL (ref 3.5–5.0)
Alkaline Phosphatase: 98 U/L (ref 38–126)
Anion gap: 10 (ref 5–15)
BUN: 31 mg/dL — ABNORMAL HIGH (ref 8–23)
CO2: 21 mmol/L — ABNORMAL LOW (ref 22–32)
Calcium: 9.5 mg/dL (ref 8.9–10.3)
Chloride: 108 mmol/L (ref 98–111)
Creatinine: 1.39 mg/dL — ABNORMAL HIGH (ref 0.61–1.24)
GFR, Estimated: 51 mL/min — ABNORMAL LOW (ref >60)
Glucose, Bld: 144 mg/dL — ABNORMAL HIGH (ref 70–99)
Potassium: 4.6 mmol/L (ref 3.5–5.1)
Sodium: 139 mmol/L (ref 135–145)
Total Bilirubin: 0.6 mg/dL (ref 0.3–1.2)
Total Protein: 7.1 g/dL (ref 6.5–8.1)

## 2022-11-16 LAB — LACTATE DEHYDROGENASE: LDH: 162 U/L (ref 98–192)

## 2022-11-16 NOTE — Progress Notes (Signed)
HEMATOLOGY/ONCOLOGY CLINIC NOTE  Date of Service: 11/16/22   Patient Care Team: [name], MD as PCP - General (Internal Medicine) [name], [facility] as Pharmacist (Pharmacist) [name], [name], RN as Oncology Nurse Navigator [name], MD as Consulting Physician (Radiation Oncology) [name], MD as Consulting Physician (Hematology) [name], MD as Referring Physician (Dermatology)  CHIEF COMPLAINTS/PURPOSE OF CONSULTATION:  Follow-up for continued evaluation and management of CLL  HISTORY OF PRESENTING ILLNESS:  Please see previous note for details of initial presentation  INTERVAL HISTORY:   Tony Banks is a 82 y.o. 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 Patient was last seen by me on 08/17/2022 and complained of consistent bilateral leg pain as well as a fall which caused mild pain to his left knee.  Today, he reports that he has recently started 10 MG Jardiance once daily. Since being on this medication, he reports some mild urinary frequency and that his glucose levels have improved. His glucose levels at home range in the low 80s-mid 90s while fasting. He reports that he is not consuming as much food and has had some weight loss, which he attributes to [place].   In regards to his diet, he does limit his sugar intake. He reports that he has recently increased his activity with warmer temperatures. He denies any fever, chills, night sweat, new lumps/bumps, fatigue, or abdominal pain. He does occasionally take Miralax to control his bowel habits.  MEDICAL HISTORY:  Past Medical History:  Diagnosis Date   Aortic atherosclerosis (HCC)    CLL (chronic lymphocytic leukemia) (HCC)    Constipation    severe   Diabetes mellitus without complication (HCC)    Diverticulosis    HLD (hyperlipidemia)    Hypertension    Nephrolithiasis     SURGICAL HISTORY: Past Surgical History:  Procedure Laterality Date    FINGER FRACTURE SURGERY Right    middle finger   SHOULDER ARTHROSCOPY Bilateral    VASECTOMY      SOCIAL HISTORY: Social History   Socioeconomic History   Marital status: Married    Spouse name: [place]   Number of children: 3   Years of education: 14   Highest education level: Some college, no degree  Occupational History   Occupation: retired  Tobacco Use   Smoking status: Former    Types: Cigarettes    Quit date: 07/31/1977    Years since quitting: 45.3   Smokeless tobacco: Never   Tobacco comments:    quit 30 yrs ago  Vaping Use   Vaping Use: Never used  Substance and Sexual Activity   Alcohol use: No   Drug use: No   Sexual activity: Not Currently    Birth control/protection: Surgical  Other Topics Concern   Not on file  Social History Narrative   Drinks 1-2 cups of coffee. Enjoys reading, watching tv and likes to ride his stationary bike.    Social Determinants of Health   Financial Resource Strain: Low Risk  (10/12/2020)   Overall Financial Resource Strain (CARDIA)    Difficulty of Paying Living Expenses: Not hard at all  Food Insecurity: No Food Insecurity (10/12/2020)   Hunger Vital Sign    Worried About Running Out of Food in the Last Year: Never true    Ran Out of Food in the Last Year: Never true  Transportation Needs: No Transportation Needs (10/12/2020)   PRAPARE - Hydrologist (Medical):  No    Lack of Transportation (Non-Medical): No  Physical Activity: Sufficiently Active (10/12/2020)   Exercise Vital Sign    Days of Exercise per Week: 7 days    Minutes of Exercise per Session: 80 min  Stress: No Stress Concern Present (10/12/2020)   [place]    Feeling of Stress : Not at all  Social Connections: Moderately Isolated (10/12/2020)   Social Connection and Isolation Panel [NHANES]    Frequency of Communication with Friends and Family: Once a week    Frequency of  Social Gatherings with Friends and Family: Once a week    Attends Religious Services: More than 4 times per year    Active Member of [employer] or Organizations: No    Attends Archivist Meetings: Never    Marital Status: Married  Human resources officer Violence: Not At Risk (10/12/2020)   Humiliation, Afraid, Rape, and Kick questionnaire    Fear of Current or Ex-Partner: No    Emotionally Abused: No    Physically Abused: No    Sexually Abused: No    FAMILY HISTORY: Family History  Problem Relation Age of Onset   Heart disease Mother    Stroke Mother    Cancer Father    Stroke Maternal Grandfather    Colon cancer Neg Hx    Pancreatic cancer Neg Hx    Rectal cancer Neg Hx    Stomach cancer Neg Hx     ALLERGIES:  has No Known Allergies.  MEDICATIONS:  Current Outpatient Medications  Medication Sig Dispense Refill   ACCU-CHEK AVIVA PLUS test strip USE DAILY OR UP TO 4 TIMES PER DAY AS DIRECTED). USE AS INSTRUCTED 200 strip PRN   ACCU-CHEK SOFTCLIX LANCETS lancets Use as instructed 100 each 12   AMBULATORY NON FORMULARY MEDICATION Accu-chek aviva plus test strips and accu-chek softclix lancets, use to check blood sugar up to four times a day. Dx Type 2 diabetes. 100 Units 3   atorvastatin (LIPITOR) 10 MG tablet Take 1 tablet (10 mg total) by mouth daily. 90 tablet 1   enalapril (VASOTEC) 5 MG tablet Take 1 tablet (5 mg total) by mouth daily. 90 tablet 1   Ferrous Sulfate (SLOW FE) 142 (45 Fe) MG TBCR Take 1 tablet by mouth daily. 30 tablet    magnesium 30 MG tablet Take 30 mg by mouth 2 (two) times daily.     metFORMIN (GLUCOPHAGE) 500 MG tablet TAKE 2 TABLETS BY MOUTH EVERY DAY WITH BREAKFAST (Patient taking differently: Take 1,000 mg by mouth daily with breakfast.) 180 tablet 3   Multiple Vitamin (MULTIVITAMIN WITH MINERALS) TABS tablet Take 1 tablet by mouth daily.     polyethylene glycol (MIRALAX) packet Take 17 g by mouth daily. 28 each 0   No current facility-administered  medications for this visit.    REVIEW OF SYSTEMS:    10 Point review of Systems was done is negative except as noted above.   PHYSICAL EXAMINATION: ECOG PERFORMANCE STATUS: 0 - Asymptomatic  . Vitals:   11/16/22 1240  BP: (!) 114/52  Pulse: 94  Resp: 18  Temp: 97.9 F (36.6 C)  SpO2: 99%    Filed Weights   11/16/22 1240  Weight: 144 lb 1.6 oz (65.4 kg)  .Body mass index is 21.91 kg/m.   GENERAL:alert, in no acute distress and comfortable SKIN: no acute rashes, no significant lesions EYES: conjunctiva are pink and non-injected, sclera anicteric OROPHARYNX: MMM, no exudates, no  oropharyngeal erythema or ulceration NECK: supple, no JVD LYMPH:  no palpable lymphadenopathy in the cervical, axillary or inguinal regions LUNGS: clear to auscultation b/l with normal respiratory effort HEART: regular rate & rhythm ABDOMEN:  normoactive bowel sounds , non tender, not distended. Extremity: no pedal edema PSYCH: alert & oriented x 3 with fluent speech NEURO: no focal motor/sensory deficits   LABORATORY DATA:  I have reviewed the data as listed  .    Latest Ref Rng & Units 11/16/2022   11:45 AM 08/17/2022   11:28 AM 05/18/2022    1:47 PM  CBC  WBC 4.0 - 10.5 K/uL 208.9  215.8  164.0   Hemoglobin 13.0 - 17.0 g/dL 9.2  9.4  9.4   Hematocrit 39.0 - 52.0 % 29.7  30.1  29.3   Platelets 150 - 400 K/uL 137  137  129    . CBC    Component Value Date/Time   WBC 208.9 (HH) 11/16/2022 1145   WBC 139.2 (HH) 11/15/2021 1345   RBC 2.93 (L) 11/16/2022 1145   HGB 9.2 (L) 11/16/2022 1145   HGB 12.8 (L) 05/29/2017 1141   HCT 29.7 (L) 11/16/2022 1145   HCT 38.7 05/29/2017 1141   PLT 137 (L) 11/16/2022 1145   PLT 151 05/29/2017 1141   MCV 101.4 (H) 11/16/2022 1145   MCV 91.3 05/29/2017 1141   MCH 31.4 11/16/2022 1145   MCHC 31.0 11/16/2022 1145   RDW 15.3 11/16/2022 1145   RDW 13.8 05/29/2017 1141   LYMPHSABS 186.5 (H) 11/16/2022 1145   LYMPHSABS 8.8 (H) 05/29/2017 1141    MONOABS 16.3 (H) 11/16/2022 1145   MONOABS 0.7 05/29/2017 1141   EOSABS 0.3 11/16/2022 1145   EOSABS 0.1 05/29/2017 1141   BASOSABS 0.1 11/16/2022 1145   BASOSABS 0.0 05/29/2017 1141    .    Latest Ref Rng & Units 11/16/2022   11:45 AM 11/02/2022    3:12 PM 08/17/2022   11:28 AM  CMP  Glucose 70 - 99 mg/dL 144   119   BUN 8 - 23 mg/dL 31   29   Creatinine 0.61 - 1.24 mg/dL 1.39  1.40  1.22   Sodium 135 - 145 mmol/L 139   141   Potassium 3.5 - 5.1 mmol/L 4.6   5.1   Chloride 98 - 111 mmol/L 108   108   CO2 22 - 32 mmol/L 21   28   Calcium 8.9 - 10.3 mg/dL 9.5   9.8   Total Protein 6.5 - 8.1 g/dL 7.1   7.0   Total Bilirubin 0.3 - 1.2 mg/dL 0.6   0.6   Alkaline Phos 38 - 126 U/L 98   98   AST 15 - 41 U/L 23   19   ALT 0 - 44 U/L 14   15    06/08/17 FISH CLL Prognostic Panel:    06/08/17 Cytogenetics:   05/29/17 Peripheral blood flow cytometry:    RADIOGRAPHIC STUDIES: I have personally reviewed the radiological images as listed and agreed with the findings in the report. CT CHEST ABDOMEN PELVIS W CONTRAST  Result Date: 11/03/2022 CLINICAL DATA:  Chronic lymphocytic leukemia. Pretreatment baseline. Diabetes. 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 * Tracking Code: BO * EXAM: CT CHEST, ABDOMEN, AND PELVIS WITH CONTRAST TECHNIQUE: Multidetector CT imaging of the chest, abdomen and pelvis was performed following the standard protocol during bolus administration of intravenous contrast. RADIATION DOSE REDUCTION: This exam  was performed according to the departmental dose-optimization program which includes automated exposure control, adjustment of the mA and/or kV according to patient size and/or use of iterative reconstruction technique. CONTRAST:  184mL OMNIPAQUE IOHEXOL 300 MG/ML  SOLN COMPARISON:  04/02/2021 FINDINGS: CT CHEST FINDINGS Cardiovascular: Aortic atherosclerosis. Mild cardiomegaly, without pericardial effusion. Left main and  3 vessel coronary artery calcification. No central pulmonary embolism, on this non-dedicated study. Mediastinum/Nodes: Bilateral supraclavicular/low jugular mild adenopathy. Example right sided node of 9 mm on 02/02 versus 8 mm on the prior (when remeasured). Bilateral axillary adenopathy with an index left axillary node measuring 3.2 x 1.9 cm on 16/2 versus similar on the prior (when remeasured). Mediastinal adenopathy with an index subcarinal node or conglomerate of nodes measuring 1.5 cm on 33/2 versus 1.4 cm on the prior exam (when remeasured). Bilateral infrahilar adenopathy with an index left-sided node measuring 9 mm on 35/2 versus similar on the prior (when remeasured). Tiny hiatal hernia. Lungs/Pleura: No pleural fluid.  Moderate centrilobular emphysema. Scattered calcified granulomas. Musculoskeletal: No acute osseous abnormality. CT ABDOMEN PELVIS FINDINGS Hepatobiliary: Hepatomegaly at 20.7 cm craniocaudal. No focal liver lesion or biliary abnormality. Pancreas: Normal, without mass or ductal dilatation. Spleen: Splenomegaly at 14.4 cm by 15.4 x 10.1 cm (volume = 1170 cm^3). Compare 12.3 cm and 900 cc on the prior. Adrenals/Urinary Tract: Normal right adrenal gland. Mild left adrenal thickening. Punctate lower pole right renal collecting system calculus. Upper pole left renal cyst or minimally complex cyst of 8 mm . In the absence of clinically indicated signs/symptoms require(s) no independent follow-up. No hydronephrosis. Normal urinary bladder. Stomach/Bowel: Normal remainder of the stomach. Extensive colonic diverticulosis. Normal terminal ileum and appendix. Normal small bowel. Vascular/Lymphatic: Separate origins of the common hepatic and splenic arteries. Advanced aortic and branch vessel atherosclerosis. Abdominopelvic adenopathy. Portacaval node measures 2.5 cm on 62/2 versus 2.2 cm on the prior exam when remeasured. More anterior porta hepatis node measures 2.1 cm on 62/2 versus 2.0 cm on the  prior exam. Index celiac axis node measures 1.2 cm on 64/2 versus similar. Bilateral inguinal adenopathy. Index left inguinal node measures 1.5 cm on 1/10/2 and is unchanged. Reproductive: Normal prostate. Other: No significant free fluid.  No free intraperitoneal air. Musculoskeletal: Degenerative changes of the right greater than left hips are mild. Lumbar spondylosis. IMPRESSION: 1. Since 04/02/2021, minimal progression of leukemia/lymphoma. Although the majority of nodes are similar in size, splenomegaly is increased and minimal enlargement of mediastinal and abdominal nodes as detailed above. 2. Right nephrolithiasis 3. Hepatomegaly 4. Coronary artery atherosclerosis. Aortic Atherosclerosis (ICD10-I70.0). Electronically Signed   By: Abigail Miyamoto M.D.   On: 11/03/2022 09:18    ASSESSMENT & PLAN:   82 y.o. male with  1. Chronic Lymphocytic Leukemia Rai 1  06/08/17 CLL FISH Prognostic panel negative for del17p, del11q, del13q, indicating intermediate risk 11/15/2019 CT C/A/P ([id number]) revealed gradual lymph node increase compared to 2018 CT. No major lymphoma progression. Mildly enlarged prostate.    2.  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 -Continue to monitor and manage per dermatology  PLAN:  -Discussed lab results on 11/16/2022 with patient. CBC showed WBC of 208.9K, hemoglobin of 9.2, and platelets of 137K. -mild anemia  -anemia may be related to CLL -Recent CT imaging of abdm/pelvis, 11/02/2022 revealed stable lymph nodes and larger spleen, 15.5 cm in size. -larger spleen can contribute to anemia or low platelet counts -informed patient that if spleen reaches at least 20cm in size, this may cause  discomfort or loss of appetite -informed patient that an enlarged spleen does meet criteria to initiate treatment for CLL at this time -recommend pt to avoid any physical trauma to the abdominal area. If spleen is too large, there is an increased risk of  rupture or bleeding -patient would like to hold off on receiving any treatment at this time -will continue to monitor disease with labs every 3 months -discussed option of daily Acalabrutinib tablet or receiving IV treatments in addition to Acalabrutinib to control CLL, which patient would not like to try at this time -advised patient to drink plenty of water while on Jardiance to avoid urinary infections and dehydration  FOLLOW-UP: RTC with Colene [place] with labs in 4 months  The total time spent in the appointment was 30 minutes* .  All of the patient's questions were answered with apparent satisfaction. The patient knows to call the clinic with any problems, questions or concerns.   Sullivan Lone MD MS AAHIVMS [facility] Hematology/Oncology Physician [facility]  .*Total Encounter Time as defined by the Centers for Medicare and Medicaid Services includes, in addition to the face-to-face time of a patient visit (documented in the note above) non-face-to-face time: obtaining and reviewing outside history, ordering and reviewing medications, tests or procedures, care coordination (communications with other health care professionals or caregivers) and documentation in the medical record.    I,Mitra Faeizi,acting as a Education administrator for Sullivan Lone, MD.,have documented all relevant documentation on the behalf of Sullivan Lone, MD,as directed by  Sullivan Lone, MD while in the presence of Sullivan Lone, MD.  I have reviewed the above documentation for accuracy and completeness, and I agree with the above. [name] MD

## 2022-11-16 NOTE — Progress Notes (Signed)
CRITICAL VALUE STICKER  CRITICAL VALUE:WBC: 208.9  DATE & TIME NOTIFIED: 12:05  MD NOTIFIED: Irene Limbo  TIME OF NOTIFICATION:12:10

## 2022-12-07 DIAGNOSIS — I10 Essential (primary) hypertension: Secondary | ICD-10-CM | POA: Diagnosis not present

## 2022-12-07 DIAGNOSIS — E1152 Type 2 diabetes mellitus with diabetic peripheral angiopathy with gangrene: Secondary | ICD-10-CM | POA: Diagnosis not present

## 2022-12-07 DIAGNOSIS — E782 Mixed hyperlipidemia: Secondary | ICD-10-CM | POA: Diagnosis not present

## 2023-02-15 DIAGNOSIS — E1152 Type 2 diabetes mellitus with diabetic peripheral angiopathy with gangrene: Secondary | ICD-10-CM | POA: Diagnosis not present

## 2023-02-15 DIAGNOSIS — I10 Essential (primary) hypertension: Secondary | ICD-10-CM | POA: Diagnosis not present

## 2023-02-15 DIAGNOSIS — E782 Mixed hyperlipidemia: Secondary | ICD-10-CM | POA: Diagnosis not present

## 2023-02-15 DIAGNOSIS — Z0001 Encounter for general adult medical examination with abnormal findings: Secondary | ICD-10-CM | POA: Diagnosis not present

## 2023-03-21 ENCOUNTER — Other Ambulatory Visit: Payer: Self-pay

## 2023-03-21 DIAGNOSIS — C9110 Chronic lymphocytic leukemia of B-cell type not having achieved remission: Secondary | ICD-10-CM

## 2023-03-22 ENCOUNTER — Inpatient Hospital Stay: Payer: Medicare HMO | Admitting: Hematology

## 2023-03-22 ENCOUNTER — Telehealth: Payer: Self-pay

## 2023-03-22 ENCOUNTER — Inpatient Hospital Stay: Payer: Medicare HMO | Attending: Hematology

## 2023-03-22 ENCOUNTER — Other Ambulatory Visit: Payer: Self-pay

## 2023-03-22 VITALS — BP 114/56 | HR 98 | Temp 97.7°F | Resp 18 | Wt 141.2 lb

## 2023-03-22 DIAGNOSIS — C4442 Squamous cell carcinoma of skin of scalp and neck: Secondary | ICD-10-CM | POA: Diagnosis not present

## 2023-03-22 DIAGNOSIS — D649 Anemia, unspecified: Secondary | ICD-10-CM

## 2023-03-22 DIAGNOSIS — Z87891 Personal history of nicotine dependence: Secondary | ICD-10-CM | POA: Insufficient documentation

## 2023-03-22 DIAGNOSIS — C9110 Chronic lymphocytic leukemia of B-cell type not having achieved remission: Secondary | ICD-10-CM | POA: Diagnosis not present

## 2023-03-22 DIAGNOSIS — N400 Enlarged prostate without lower urinary tract symptoms: Secondary | ICD-10-CM | POA: Diagnosis not present

## 2023-03-22 LAB — CBC WITH DIFFERENTIAL (CANCER CENTER ONLY)
Abs Immature Granulocytes: 0.85 10*3/uL — ABNORMAL HIGH (ref 0.00–0.07)
Basophils Absolute: 0.1 10*3/uL (ref 0.0–0.1)
Basophils Relative: 0 %
Eosinophils Absolute: 0.4 10*3/uL (ref 0.0–0.5)
Eosinophils Relative: 0 %
HCT: 27.3 % — ABNORMAL LOW (ref 39.0–52.0)
Hemoglobin: 8.6 g/dL — ABNORMAL LOW (ref 13.0–17.0)
Immature Granulocytes: 0 %
Lymphocytes Relative: 91 %
Lymphs Abs: 174.6 10*3/uL — ABNORMAL HIGH (ref 0.7–4.0)
MCH: 31.4 pg (ref 26.0–34.0)
MCHC: 31.5 g/dL (ref 30.0–36.0)
MCV: 99.6 fL (ref 80.0–100.0)
Monocytes Absolute: 11.6 10*3/uL — ABNORMAL HIGH (ref 0.1–1.0)
Monocytes Relative: 6 %
Neutro Abs: 5.9 10*3/uL (ref 1.7–7.7)
Neutrophils Relative %: 3 %
Platelet Count: 131 10*3/uL — ABNORMAL LOW (ref 150–400)
RBC: 2.74 MIL/uL — ABNORMAL LOW (ref 4.22–5.81)
RDW: 16.3 % — ABNORMAL HIGH (ref 11.5–15.5)
Smear Review: NORMAL
WBC Count: 193.4 10*3/uL (ref 4.0–10.5)
nRBC: 0 % (ref 0.0–0.2)

## 2023-03-22 LAB — CMP (CANCER CENTER ONLY)
ALT: 12 U/L (ref 0–44)
AST: 18 U/L (ref 15–41)
Albumin: 4.4 g/dL (ref 3.5–5.0)
Alkaline Phosphatase: 101 U/L (ref 38–126)
Anion gap: 7 (ref 5–15)
BUN: 21 mg/dL (ref 8–23)
CO2: 23 mmol/L (ref 22–32)
Calcium: 9.2 mg/dL (ref 8.9–10.3)
Chloride: 112 mmol/L — ABNORMAL HIGH (ref 98–111)
Creatinine: 1.19 mg/dL (ref 0.61–1.24)
GFR, Estimated: >60 mL/min (ref >60)
Glucose, Bld: 84 mg/dL (ref 70–99)
Potassium: 4 mmol/L (ref 3.5–5.1)
Sodium: 142 mmol/L (ref 135–145)
Total Bilirubin: 0.7 mg/dL (ref 0.3–1.2)
Total Protein: 6.9 g/dL (ref 6.5–8.1)

## 2023-03-22 LAB — LACTATE DEHYDROGENASE: LDH: 147 U/L (ref 98–192)

## 2023-03-22 NOTE — Telephone Encounter (Signed)
CRITICAL VALUE STICKER  CRITICAL VALUE: White count 193.4  RECEIVER (on-site recipient of call): Sharlette Dense CMA  DATE & TIME NOTIFIED: 03/22/2023 1220  MESSENGER (representative from lab): Heather in Lab  MD NOTIFIED: Dr. Candise Che  TIME OF NOTIFICATION: 1220  RESPONSE: Made nurse aware

## 2023-03-22 NOTE — Progress Notes (Signed)
HEMATOLOGY/ONCOLOGY CLINIC NOTE  Date of Service: 03/22/23   Patient Care Team: Jackie Plum, MD as PCP - General (Internal Medicine) Gabriel Carina, [facility] as Pharmacist (Pharmacist) [name], Lise Auer, RN as Oncology Nurse Navigator Lonie Peak, MD as Consulting Physician (Radiation Oncology) Johney Maine, MD as Consulting Physician (Hematology) Glennis Brink, MD as Referring Physician (Dermatology)  CHIEF COMPLAINTS/PURPOSE OF CONSULTATION:  Follow-up for continued evaluation and management of CLL  HISTORY OF PRESENTING ILLNESS:  Please see previous note for details of initial presentation  INTERVAL HISTORY:   Tony Banks is a 82 y.o. 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 Patient was last seen by me on 11/16/2022 and complained of mild urinary frequency, but was otherwise doing well overall with no new medical concerns.   Today, he reports that he has been doing well overall since his last visit. He denies any change in energy levels, unexplained fevers, chills, night sweats, lumps/bumps, SOB, chest pain, abdominal pain, change in bowel/urination habits, or leg swelling.   He does report bleeding/bruising after engaging in yard work in the mornings. Patient denies any lightheadedness/dizziness  He regularly takes a multivitamin.  He reports that he has been bitten 3x by a stray cat.  He denies any abdominal pain, leg swelling  Patient continues to take Metformin and Jardiance for DM management.   MEDICAL HISTORY:  Past Medical History:  Diagnosis Date   Aortic atherosclerosis (HCC)    CLL (chronic lymphocytic leukemia) (HCC)    Constipation    severe   Diabetes mellitus without complication (HCC)    Diverticulosis    HLD (hyperlipidemia)    Hypertension    Nephrolithiasis     SURGICAL HISTORY: Past Surgical History:  Procedure Laterality Date   FINGER FRACTURE SURGERY Right    middle finger   SHOULDER  ARTHROSCOPY Bilateral    VASECTOMY      SOCIAL HISTORY: Social History   Socioeconomic History   Marital status: Married    Spouse name: [name]   Number of children: 3   Years of education: 14   Highest education level: Some college, no degree  Occupational History   Occupation: retired  Tobacco Use   Smoking status: Former    Current packs/day: 0.00    Types: Cigarettes    Quit date: 07/31/1977    Years since quitting: 45.6   Smokeless tobacco: Never   Tobacco comments:    quit 30 yrs ago  Vaping Use   Vaping status: Never Used  Substance and Sexual Activity   Alcohol use: No   Drug use: No   Sexual activity: Not Currently    Birth control/protection: Surgical  Other Topics Concern   Not on file  Social History Narrative   Drinks 1-2 cups of coffee. Enjoys reading, watching tv and likes to ride his stationary bike.    Social Determinants of Health   Financial Resource Strain: Low Risk  (10/12/2020)   Overall Financial Resource Strain (CARDIA)    Difficulty of Paying Living Expenses: Not hard at all  Food Insecurity: No Food Insecurity (10/12/2020)   Hunger Vital Sign    Worried About Running Out of Food in the Last Year: Never true    Ran Out of Food in the Last Year: Never true  Transportation Needs: No Transportation Needs (10/12/2020)   PRAPARE - Administrator, Civil Service (Medical): No    Lack of Transportation (Non-Medical): No  Physical Activity: Sufficiently  Active (10/12/2020)   Exercise Vital Sign    Days of Exercise per Week: 7 days    Minutes of Exercise per Session: 80 min  Stress: No Stress Concern Present (10/12/2020)   [employer] of Occupational Health - Occupational Stress Questionnaire    Feeling of Stress : Not at all  Social Connections: Moderately Isolated (10/12/2020)   Social Connection and Isolation Panel [NHANES]    Frequency of Communication with Friends and Family: Once a week    Frequency of Social Gatherings with Friends  and Family: Once a week    Attends Religious Services: More than 4 times per year    Active Member of [employer] or Organizations: No    Attends Banker Meetings: Never    Marital Status: Married  Catering manager Violence: Not At Risk (10/12/2020)   Humiliation, Afraid, Rape, and Kick questionnaire    Fear of Current or Ex-Partner: No    Emotionally Abused: No    Physically Abused: No    Sexually Abused: No    FAMILY HISTORY: Family History  Problem Relation Age of Onset   Heart disease Mother    Stroke Mother    Cancer Father    Stroke Maternal Grandfather    Colon cancer Neg Hx    Pancreatic cancer Neg Hx    Rectal cancer Neg Hx    Stomach cancer Neg Hx     ALLERGIES:  has No Known Allergies.  MEDICATIONS:  Current Outpatient Medications  Medication Sig Dispense Refill   ACCU-CHEK AVIVA PLUS test strip USE DAILY OR UP TO 4 TIMES PER DAY AS DIRECTED). USE AS INSTRUCTED 200 strip PRN   ACCU-CHEK SOFTCLIX LANCETS lancets Use as instructed 100 each 12   AMBULATORY NON FORMULARY MEDICATION Accu-chek aviva plus test strips and accu-chek softclix lancets, use to check blood sugar up to four times a day. Dx Type 2 diabetes. 100 Units 3   atorvastatin (LIPITOR) 10 MG tablet Take 1 tablet (10 mg total) by mouth daily. 90 tablet 1   enalapril (VASOTEC) 5 MG tablet Take 1 tablet (5 mg total) by mouth daily. 90 tablet 1   Ferrous Sulfate (SLOW FE) 142 (45 Fe) MG TBCR Take 1 tablet by mouth daily. 30 tablet    magnesium 30 MG tablet Take 30 mg by mouth 2 (two) times daily.     metFORMIN (GLUCOPHAGE) 500 MG tablet TAKE 2 TABLETS BY MOUTH EVERY DAY WITH BREAKFAST (Patient taking differently: Take 1,000 mg by mouth daily with breakfast.) 180 tablet 3   Multiple Vitamin (MULTIVITAMIN WITH MINERALS) TABS tablet Take 1 tablet by mouth daily.     polyethylene glycol (MIRALAX) packet Take 17 g by mouth daily. 28 each 0   No current facility-administered medications for this visit.     REVIEW OF SYSTEMS:    10 Point review of Systems was done is negative except as noted above.   PHYSICAL EXAMINATION: ECOG PERFORMANCE STATUS: 0 - Asymptomatic  . Vitals:   03/22/23 1232  BP: (!) 114/56  Pulse: 98  Resp: 18  Temp: 97.7 F (36.5 C)  SpO2: 99%   Filed Weights   03/22/23 1232  Weight: 141 lb 3.2 oz (64 kg)   .Body mass index is 21.47 kg/m.  GENERAL:alert, in no acute distress and comfortable SKIN: no acute rashes, no significant lesions EYES: conjunctiva are pink and non-injected, sclera anicteric OROPHARYNX: MMM, no exudates, no oropharyngeal erythema or ulceration NECK: supple, no JVD LYMPH:  no palpable lymphadenopathy in  the cervical, axillary or inguinal regions LUNGS: clear to auscultation b/l with normal respiratory effort HEART: regular rate & rhythm ABDOMEN:  normoactive bowel sounds , non tender, not distended. Extremity: no pedal edema PSYCH: alert & oriented x 3 with fluent speech NEURO: no focal motor/sensory deficits   LABORATORY DATA:  I have reviewed the data as listed  .    Latest Ref Rng & Units 03/22/2023   11:58 AM 11/16/2022   11:45 AM 08/17/2022   11:28 AM  CBC  WBC 4.0 - 10.5 K/uL 193.4  208.9  215.8   Hemoglobin 13.0 - 17.0 g/dL 8.6  9.2  9.4   Hematocrit 39.0 - 52.0 % 27.3  29.7  30.1   Platelets 150 - 400 K/uL 131  137  137    . CBC    Component Value Date/Time   WBC 193.4 (HH) 03/22/2023 1158   WBC 139.2 (HH) 11/15/2021 1345   RBC 2.74 (L) 03/22/2023 1158   HGB 8.6 (L) 03/22/2023 1158   HGB 12.8 (L) 05/29/2017 1141   HCT 27.3 (L) 03/22/2023 1158   HCT 38.7 05/29/2017 1141   PLT 131 (L) 03/22/2023 1158   PLT 151 05/29/2017 1141   MCV 99.6 03/22/2023 1158   MCV 91.3 05/29/2017 1141   MCH 31.4 03/22/2023 1158   MCHC 31.5 03/22/2023 1158   RDW 16.3 (H) 03/22/2023 1158   RDW 13.8 05/29/2017 1141   LYMPHSABS 174.6 (H) 03/22/2023 1158   LYMPHSABS 8.8 (H) 05/29/2017 1141   MONOABS 11.6 (H) 03/22/2023 1158    MONOABS 0.7 05/29/2017 1141   EOSABS 0.4 03/22/2023 1158   EOSABS 0.1 05/29/2017 1141   BASOSABS 0.1 03/22/2023 1158   BASOSABS 0.0 05/29/2017 1141    .    Latest Ref Rng & Units 03/22/2023   11:58 AM 11/16/2022   11:45 AM 11/02/2022    3:12 PM  CMP  Glucose 70 - 99 mg/dL 84  604    BUN 8 - 23 mg/dL 21  31    Creatinine 5.40 - 1.24 mg/dL 9.81  1.91  4.78   Sodium 135 - 145 mmol/L 142  139    Potassium 3.5 - 5.1 mmol/L 4.0  4.6    Chloride 98 - 111 mmol/L 112  108    CO2 22 - 32 mmol/L 23  21    Calcium 8.9 - 10.3 mg/dL 9.2  9.5    Total Protein 6.5 - 8.1 g/dL 6.9  7.1    Total Bilirubin 0.3 - 1.2 mg/dL 0.7  0.6    Alkaline Phos 38 - 126 U/L 101  98    AST 15 - 41 U/L 18  23    ALT 0 - 44 U/L 12  14    06/08/17 FISH CLL Prognostic Panel:    06/08/17 Cytogenetics:   05/29/17 Peripheral blood flow cytometry:    RADIOGRAPHIC STUDIES: I have personally reviewed the radiological images as listed and agreed with the findings in the report. No results found.  ASSESSMENT & PLAN:   82 y.o. male with  1. Chronic Lymphocytic Leukemia Rai 1  06/08/17 CLL FISH Prognostic panel negative for del17p, del11q, del13q, indicating intermediate risk 11/15/2019 CT C/A/P ([id number]) revealed gradual lymph node increase compared to 2018 CT. No major lymphoma progression. Mildly enlarged prostate.    2.  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 -Continue to monitor and manage per dermatology  PLAN:  -Discussed lab results on 03/22/2023 in detail with patient.  CBC showed WBC improved to 193.4K, hemoglobin decreased to 8.6, and platelets slightly low and stable 131K. -patient is anemic at this time -informed patient that if hemoglobin levels decrease below 7.5, he may endorse symptoms such as lightheadedness, dizziness, or fatigue -CMP shows improvement -informed patient that bleeding/bruising may be age-related or may be attributed to scratching from yard  work -Patient does meet criteria to initiate treatment for CLL at this time due to anemia. Also discussed option to continue to monitor -informed patient that if his hemoglobin levels worsen, he may experience weakness or require blood transfusions  -discussed details of potential treatment such as long-term Acalabrutinib twice daily or fixed duration treatment involving Venetoclax along with IV Gazyva antibody.  -reasonable to proceed with Acalabrutinib as there is no IV involved. Discussed potential side effects such as bruising or 5% risk of atrial fibrillation. -informed patient that treatment may involve short term Prednisone, though this would generally be avoided as patient does endorse DM and steroids may elevate blood sugars -advised patient to let me know if he endorses any lightheadedness/dizziness or fatigue -recommend a diet with plenty of fresh fruits and vegetables -continue multivitamin -answered all of patient's questions in detail -will continue to monitor with labs in 2-3 months  FOLLOW-UP: RTC with Dr Candise Che with labs in 10 weeks  The total time spent in the appointment was 21 minutes* .  All of the patient's questions were answered with apparent satisfaction. The patient knows to call the clinic with any problems, questions or concerns.   Wyvonnia Lora MD MS AAHIVMS [facility] Hematology/Oncology Physician [facility]  .*Total Encounter Time as defined by the Centers for Medicare and Medicaid Services includes, in addition to the face-to-face time of a patient visit (documented in the note above) non-face-to-face time: obtaining and reviewing outside history, ordering and reviewing medications, tests or procedures, care coordination (communications with other health care professionals or caregivers) and documentation in the medical record.    I,Mitra Faeizi,acting as a Neurosurgeon for Wyvonnia Lora, MD.,have documented all relevant documentation on the behalf of Wyvonnia Lora, MD,as directed by  Wyvonnia Lora, MD while in the presence of Wyvonnia Lora, MD.  .I have reviewed the above documentation for accuracy and completeness, and I agree with the above. Johney Maine MD

## 2023-04-19 DIAGNOSIS — M7052 Other bursitis of knee, left knee: Secondary | ICD-10-CM | POA: Diagnosis not present

## 2023-05-10 DIAGNOSIS — E1152 Type 2 diabetes mellitus with diabetic peripheral angiopathy with gangrene: Secondary | ICD-10-CM | POA: Diagnosis not present

## 2023-05-10 DIAGNOSIS — J439 Emphysema, unspecified: Secondary | ICD-10-CM | POA: Diagnosis not present

## 2023-05-10 DIAGNOSIS — E1151 Type 2 diabetes mellitus with diabetic peripheral angiopathy without gangrene: Secondary | ICD-10-CM | POA: Diagnosis not present

## 2023-05-10 DIAGNOSIS — I10 Essential (primary) hypertension: Secondary | ICD-10-CM | POA: Diagnosis not present

## 2023-05-10 DIAGNOSIS — E782 Mixed hyperlipidemia: Secondary | ICD-10-CM | POA: Diagnosis not present

## 2023-06-06 ENCOUNTER — Other Ambulatory Visit: Payer: Self-pay

## 2023-06-06 DIAGNOSIS — C9110 Chronic lymphocytic leukemia of B-cell type not having achieved remission: Secondary | ICD-10-CM

## 2023-06-07 ENCOUNTER — Telehealth: Payer: Self-pay | Admitting: *Deleted

## 2023-06-07 ENCOUNTER — Telehealth: Payer: Self-pay | Admitting: Pharmacy Technician

## 2023-06-07 ENCOUNTER — Telehealth: Payer: Self-pay

## 2023-06-07 ENCOUNTER — Other Ambulatory Visit: Payer: Self-pay | Admitting: Pharmacy Technician

## 2023-06-07 ENCOUNTER — Inpatient Hospital Stay: Payer: Medicare HMO | Attending: Hematology

## 2023-06-07 ENCOUNTER — Inpatient Hospital Stay: Payer: Medicare HMO | Admitting: Hematology

## 2023-06-07 ENCOUNTER — Other Ambulatory Visit (HOSPITAL_COMMUNITY): Payer: Self-pay

## 2023-06-07 VITALS — BP 129/48 | HR 66 | Temp 97.7°F | Resp 18 | Wt 138.1 lb

## 2023-06-07 DIAGNOSIS — R61 Generalized hyperhidrosis: Secondary | ICD-10-CM | POA: Diagnosis not present

## 2023-06-07 DIAGNOSIS — R7989 Other specified abnormal findings of blood chemistry: Secondary | ICD-10-CM | POA: Insufficient documentation

## 2023-06-07 DIAGNOSIS — C9110 Chronic lymphocytic leukemia of B-cell type not having achieved remission: Secondary | ICD-10-CM | POA: Insufficient documentation

## 2023-06-07 DIAGNOSIS — Z87891 Personal history of nicotine dependence: Secondary | ICD-10-CM | POA: Diagnosis not present

## 2023-06-07 DIAGNOSIS — R944 Abnormal results of kidney function studies: Secondary | ICD-10-CM | POA: Insufficient documentation

## 2023-06-07 DIAGNOSIS — R252 Cramp and spasm: Secondary | ICD-10-CM | POA: Diagnosis not present

## 2023-06-07 DIAGNOSIS — E860 Dehydration: Secondary | ICD-10-CM | POA: Insufficient documentation

## 2023-06-07 DIAGNOSIS — N400 Enlarged prostate without lower urinary tract symptoms: Secondary | ICD-10-CM | POA: Diagnosis not present

## 2023-06-07 DIAGNOSIS — D649 Anemia, unspecified: Secondary | ICD-10-CM | POA: Insufficient documentation

## 2023-06-07 LAB — CBC WITH DIFFERENTIAL (CANCER CENTER ONLY)
Abs Immature Granulocytes: 0.91 10*3/uL — ABNORMAL HIGH (ref 0.00–0.07)
Basophils Absolute: 0.1 10*3/uL (ref 0.0–0.1)
Basophils Relative: 0 %
Eosinophils Absolute: 0.3 10*3/uL (ref 0.0–0.5)
Eosinophils Relative: 0 %
HCT: 29.1 % — ABNORMAL LOW (ref 39.0–52.0)
Hemoglobin: 8.6 g/dL — ABNORMAL LOW (ref 13.0–17.0)
Immature Granulocytes: 0 %
Lymphocytes Relative: 91 %
Lymphs Abs: 200.4 10*3/uL — ABNORMAL HIGH (ref 0.7–4.0)
MCH: 30.4 pg (ref 26.0–34.0)
MCHC: 29.6 g/dL — ABNORMAL LOW (ref 30.0–36.0)
MCV: 102.8 fL — ABNORMAL HIGH (ref 80.0–100.0)
Monocytes Absolute: 14.4 10*3/uL — ABNORMAL HIGH (ref 0.1–1.0)
Monocytes Relative: 7 %
Neutro Abs: 5 10*3/uL (ref 1.7–7.7)
Neutrophils Relative %: 2 %
Platelet Count: 130 10*3/uL — ABNORMAL LOW (ref 150–400)
RBC: 2.83 MIL/uL — ABNORMAL LOW (ref 4.22–5.81)
RDW: 16.4 % — ABNORMAL HIGH (ref 11.5–15.5)
Smear Review: NORMAL
WBC Count: 221.1 10*3/uL (ref 4.0–10.5)
nRBC: 0 % (ref 0.0–0.2)

## 2023-06-07 LAB — CMP (CANCER CENTER ONLY)
ALT: 13 U/L (ref 0–44)
AST: 21 U/L (ref 15–41)
Albumin: 4.6 g/dL (ref 3.5–5.0)
Alkaline Phosphatase: 99 U/L (ref 38–126)
Anion gap: 7 (ref 5–15)
BUN: 24 mg/dL — ABNORMAL HIGH (ref 8–23)
CO2: 25 mmol/L (ref 22–32)
Calcium: 9.7 mg/dL (ref 8.9–10.3)
Chloride: 109 mmol/L (ref 98–111)
Creatinine: 1.28 mg/dL — ABNORMAL HIGH (ref 0.61–1.24)
GFR, Estimated: 56 mL/min — ABNORMAL LOW (ref >60)
Glucose, Bld: 97 mg/dL (ref 70–99)
Potassium: 4.5 mmol/L (ref 3.5–5.1)
Sodium: 141 mmol/L (ref 135–145)
Total Bilirubin: 0.8 mg/dL (ref 0.3–1.2)
Total Protein: 7.1 g/dL (ref 6.5–8.1)

## 2023-06-07 LAB — LACTATE DEHYDROGENASE: LDH: 165 U/L (ref 98–192)

## 2023-06-07 MED ORDER — ACALABRUTINIB MALEATE 100 MG PO TABS
100.0000 mg | ORAL_TABLET | Freq: Two times a day (BID) | ORAL | 1 refills | Status: DC
  Filled 2023-06-09: qty 60, 30d supply, fill #0
  Filled 2023-07-03 – 2023-07-26 (×2): qty 60, 30d supply, fill #1

## 2023-06-07 MED ORDER — ACALABRUTINIB 100 MG PO CAPS: 100.0000 mg | ORAL_CAPSULE | Freq: Two times a day (BID) | ORAL | 1 refills | Status: DC

## 2023-06-07 NOTE — Telephone Encounter (Signed)
CRITICAL VALUE STICKER  CRITICAL VALUE: WBC 221.1  RECEIVER (on-site recipient of call):Sandi K, RN  DATE & TIME NOTIFIED: 06/07/23; 1017  MESSENGER (representative from lab):Melvia Heaps  MD NOTIFIED: Dr. Margorie John T RN  TIME OF NOTIFICATION:1027  RESPONSE: Information acknowledged

## 2023-06-07 NOTE — Progress Notes (Signed)
HEMATOLOGY/ONCOLOGY CLINIC NOTE  Date of Service: 06/07/23   Patient Care Team: Jackie Plum, MD as PCP - General (Internal Medicine) Gabriel Carina, [facility] as Pharmacist (Pharmacist) [name], Lise Auer, RN as Oncology Nurse Navigator Lonie Peak, MD as Consulting Physician (Radiation Oncology) Johney Maine, MD as Consulting Physician (Hematology) Glennis Brink, MD as Referring Physician (Dermatology)  CHIEF COMPLAINTS/PURPOSE OF CONSULTATION:  Follow-up for continued evaluation and management of CLL  HISTORY OF PRESENTING ILLNESS:  Please see previous note for details of initial presentation  INTERVAL HISTORY:   Tony Banks is a 82 y.o. 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 Patient was last seen by me on 03/22/2023 and reported bleeding/bruising attributed to yard work and having 3 bites from a stray cat.  Today, he reports that his symptoms have been overall stable over the last few months. Patient reports that when moving the lawn, he does need to stop and rest after mowing 3-4 rows of grass. This has been stable for a while, though he notes his symptoms may have improved recently.    He sometimes endorses night sweats, but not on a daily basis. Patient denies any unexplained fevers, chills, new lumps/bumps, change in breathing habits, abdominal pain/distention, diarrhea, change in urination, or change in leg swelling.   Patient has lost 6 pounds since March 2024, and currently weighs 138 pounds. He reports that he was recently started on Jardiance 10 MG and continues to be on Metformin. No other new medications.   He takes fish oil 1200 MG 3X day which improves joint pain. Patient continues to stay well hydrated.  He reports that he has always bruised easily and denies any back pain. Patient complains of cramps in his bilateral lower extremities limiting his activity. He does use a treadmill for at least 10 minutes regularly.   He  is not on any diuretics at this time. Patient reports that he regularly consumes sweets daily, which have caused blood sugar issues. He reports that his A1C was recently in the 6s. Patient has not checked his blood glucose levels at home in the last few days.   Patient has a skin lesion in his left knee and notes filing it down with a finger nail file. The lesion is not growing and has been stable for years.  He reports having a circulation issue in his lower body and has a history of aortic atherosclerosis.   MEDICAL HISTORY:  Past Medical History:  Diagnosis Date   Aortic atherosclerosis (HCC)    CLL (chronic lymphocytic leukemia) (HCC)    Constipation    severe   Diabetes mellitus without complication (HCC)    Diverticulosis    HLD (hyperlipidemia)    Hypertension    Nephrolithiasis     SURGICAL HISTORY: Past Surgical History:  Procedure Laterality Date   FINGER FRACTURE SURGERY Right    middle finger   SHOULDER ARTHROSCOPY Bilateral    VASECTOMY      SOCIAL HISTORY: Social History   Socioeconomic History   Marital status: Married    Spouse name: [name]   Number of children: 3   Years of education: 14   Highest education level: Some college, no degree  Occupational History   Occupation: retired  Tobacco Use   Smoking status: Former    Current packs/day: 0.00    Types: Cigarettes    Quit date: 07/31/1977    Years since quitting: 45.8   Smokeless tobacco: Never  Tobacco comments:    quit 30 yrs ago  Vaping Use   Vaping status: Never Used  Substance and Sexual Activity   Alcohol use: No   Drug use: No   Sexual activity: Not Currently    Birth control/protection: Surgical  Other Topics Concern   Not on file  Social History Narrative   Drinks 1-2 cups of coffee. Enjoys reading, watching tv and likes to ride his stationary bike.    Social Determinants of Health   Financial Resource Strain: Low Risk  (10/12/2020)   Overall Financial Resource Strain (CARDIA)     Difficulty of Paying Living Expenses: Not hard at all  Food Insecurity: No Food Insecurity (10/12/2020)   Hunger Vital Sign    Worried About Running Out of Food in the Last Year: Never true    Ran Out of Food in the Last Year: Never true  Transportation Needs: No Transportation Needs (10/12/2020)   PRAPARE - Administrator, Civil Service (Medical): No    Lack of Transportation (Non-Medical): No  Physical Activity: Sufficiently Active (10/12/2020)   Exercise Vital Sign    Days of Exercise per Week: 7 days    Minutes of Exercise per Session: 80 min  Stress: No Stress Concern Present (10/12/2020)   [employer] of Occupational Health - Occupational Stress Questionnaire    Feeling of Stress : Not at all  Social Connections: Moderately Isolated (10/12/2020)   Social Connection and Isolation Panel [NHANES]    Frequency of Communication with Friends and Family: Once a week    Frequency of Social Gatherings with Friends and Family: Once a week    Attends Religious Services: More than 4 times per year    Active Member of [employer] or Organizations: No    Attends Banker Meetings: Never    Marital Status: Married  Catering manager Violence: Not At Risk (10/12/2020)   Humiliation, Afraid, Rape, and Kick questionnaire    Fear of Current or Ex-Partner: No    Emotionally Abused: No    Physically Abused: No    Sexually Abused: No    FAMILY HISTORY: Family History  Problem Relation Age of Onset   Heart disease Mother    Stroke Mother    Cancer Father    Stroke Maternal Grandfather    Colon cancer Neg Hx    Pancreatic cancer Neg Hx    Rectal cancer Neg Hx    Stomach cancer Neg Hx     ALLERGIES:  has No Known Allergies.  MEDICATIONS:  Current Outpatient Medications  Medication Sig Dispense Refill   ACCU-CHEK AVIVA PLUS test strip USE DAILY OR UP TO 4 TIMES PER DAY AS DIRECTED). USE AS INSTRUCTED 200 strip PRN   ACCU-CHEK SOFTCLIX LANCETS lancets Use as instructed 100  each 12   AMBULATORY NON FORMULARY MEDICATION Accu-chek aviva plus test strips and accu-chek softclix lancets, use to check blood sugar up to four times a day. Dx Type 2 diabetes. 100 Units 3   atorvastatin (LIPITOR) 10 MG tablet Take 1 tablet (10 mg total) by mouth daily. 90 tablet 1   enalapril (VASOTEC) 5 MG tablet Take 1 tablet (5 mg total) by mouth daily. 90 tablet 1   Ferrous Sulfate (SLOW FE) 142 (45 Fe) MG TBCR Take 1 tablet by mouth daily. 30 tablet    magnesium 30 MG tablet Take 30 mg by mouth 2 (two) times daily.     metFORMIN (GLUCOPHAGE) 500 MG tablet TAKE 2 TABLETS BY MOUTH  EVERY DAY WITH BREAKFAST (Patient taking differently: Take 1,000 mg by mouth daily with breakfast.) 180 tablet 3   Multiple Vitamin (MULTIVITAMIN WITH MINERALS) TABS tablet Take 1 tablet by mouth daily.     polyethylene glycol (MIRALAX) packet Take 17 g by mouth daily. 28 each 0   No current facility-administered medications for this visit.    REVIEW OF SYSTEMS:    10 Point review of Systems was done is negative except as noted above.   PHYSICAL EXAMINATION: ECOG PERFORMANCE STATUS: 0 - Asymptomatic  . Vitals:   06/07/23 1005  BP: (!) 129/48  Pulse: 66  Resp: 18  Temp: 97.7 F (36.5 C)  SpO2: 99%    Filed Weights   06/07/23 1005  Weight: 138 lb 1.6 oz (62.6 kg)    .Body mass index is 21 kg/m.    GENERAL:alert, in no acute distress and comfortable SKIN: no acute rashes, no significant lesions EYES: conjunctiva are pink and non-injected, sclera anicteric OROPHARYNX: MMM, no exudates, no oropharyngeal erythema or ulceration NECK: supple, no JVD LYMPH:  no palpable lymphadenopathy in the cervical, axillary or inguinal regions LUNGS: clear to auscultation b/l with normal respiratory effort HEART: regular rate & rhythm ABDOMEN:  normoactive bowel sounds , non tender, not distended. Extremity: no pedal edema PSYCH: alert & oriented x 3 with fluent speech NEURO: no focal motor/sensory  deficits   LABORATORY DATA:  I have reviewed the data as listed  .    Latest Ref Rng & Units 06/07/2023    9:56 AM 03/22/2023   11:58 AM 11/16/2022   11:45 AM  CBC  WBC 4.0 - 10.5 K/uL 221.1  193.4  208.9   Hemoglobin 13.0 - 17.0 g/dL 8.6  8.6  9.2   Hematocrit 39.0 - 52.0 % 29.1  27.3  29.7   Platelets 150 - 400 K/uL 130  131  137    . CBC    Component Value Date/Time   WBC 221.1 (HH) 06/07/2023 0956   WBC 139.2 (HH) 11/15/2021 1345   RBC 2.83 (L) 06/07/2023 0956   HGB 8.6 (L) 06/07/2023 0956   HGB 12.8 (L) 05/29/2017 1141   HCT 29.1 (L) 06/07/2023 0956   HCT 38.7 05/29/2017 1141   PLT 130 (L) 06/07/2023 0956   PLT 151 05/29/2017 1141   MCV 102.8 (H) 06/07/2023 0956   MCV 91.3 05/29/2017 1141   MCH 30.4 06/07/2023 0956   MCHC 29.6 (L) 06/07/2023 0956   RDW 16.4 (H) 06/07/2023 0956   RDW 13.8 05/29/2017 1141   LYMPHSABS 200.4 (H) 06/07/2023 0956   LYMPHSABS 8.8 (H) 05/29/2017 1141   MONOABS 14.4 (H) 06/07/2023 0956   MONOABS 0.7 05/29/2017 1141   EOSABS 0.3 06/07/2023 0956   EOSABS 0.1 05/29/2017 1141   BASOSABS 0.1 06/07/2023 0956   BASOSABS 0.0 05/29/2017 1141    .    Latest Ref Rng & Units 06/07/2023    9:56 AM 03/22/2023   11:58 AM 11/16/2022   11:45 AM  CMP  Glucose 70 - 99 mg/dL 97  84  657   BUN 8 - 23 mg/dL 24  21  31    Creatinine 0.61 - 1.24 mg/dL 8.46  9.62  9.52   Sodium 135 - 145 mmol/L 141  142  139   Potassium 3.5 - 5.1 mmol/L 4.5  4.0  4.6   Chloride 98 - 111 mmol/L 109  112  108   CO2 22 - 32 mmol/L 25  23  21  Calcium 8.9 - 10.3 mg/dL 9.7  9.2  9.5   Total Protein 6.5 - 8.1 g/dL 7.1  6.9  7.1   Total Bilirubin 0.3 - 1.2 mg/dL 0.8  0.7  0.6   Alkaline Phos 38 - 126 U/L 99  101  98   AST 15 - 41 U/L 21  18  23    ALT 0 - 44 U/L 13  12  14    06/08/17 FISH CLL Prognostic Panel:    06/08/17 Cytogenetics:   05/29/17 Peripheral blood flow cytometry:    RADIOGRAPHIC STUDIES: I have personally reviewed the radiological images as listed and  agreed with the findings in the report. No results found.  ASSESSMENT & PLAN:   82 y.o. male with  1. Chronic Lymphocytic Leukemia Rai 1  06/08/17 CLL FISH Prognostic panel negative for del17p, del11q, del13q, indicating intermediate risk 11/15/2019 CT C/A/P ([id number]) revealed gradual lymph node increase compared to 2018 CT. No major lymphoma progression. Mildly enlarged prostate.    2.  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 -Continue to monitor and manage per dermatology  PLAN:  -Discussed lab results on 06/07/23 in detail with patient. CBC showed WBC of 221.1K, hemoglobin of 8.6, and platelets of 130K. -patient continues to be anemic and hgb is gradually decreasing. Hemoglobin was 9.2 g/dL 6 months ago and is currently 8.6 g/dL -WBC continues to fluctuate, and has most recently increased from 193.4K in July to 221.1K currently -platelets are slightly low with no significant change at this time -CMP shows minimal dehydration, otherwise stable -his last CT chest/abdm/pelvis scan was 6 months ago -recommend patient to regularly consume fresh fruits and vegetables for immune system support -if patient would like to continue consuming sweets in moderation, would recommend to consume sweets during the early part of the day prior to activity -educated patient that Jardiance may cause weight loss -okay to take 1200 MG of fish oil 1-2x a day instead of 3x daily to try to improve bruising -Patient does meet criteria to initiate treatment for CLL at this time due to anemia. Also discussed option to continue to monitor his condition.  -discussed details of Acalabrutinib treatment. Discussed that Acalabrutinib may increase bruising and has a low risk of abnormal heart rhythm. There may also be a role for a medication to prevent gout to take for a few months with treatment -patient is hesitant about the idea of initiating treatment due to financial  considerations -He reports that if he is not financially limited, he would be open to the idea of initiating treatment -will connect with pharmacist to determine any financial considerations regarding Acalabrutinib  -recommend patient to be seen by dermatologist to evaluate left knee lesion. This could be related to a different inflammatory skin condition, though there is also concern that it may be related to a slow-growing type of skin cancer. There may be consideration of a biopsy for further evaluation.  -educated patient that CLL may increase the risk of skin cancers -advised patient to stay UTD with her age-appropriate vaccines, including flu, RSV and COVID-19 booster -answered all of patient's questions in detail  FOLLOW-UP: RTC with Dr Candise Che in 3 weeks with labs  The total time spent in the appointment was 30 minutes* .  All of the patient's questions were answered with apparent satisfaction. The patient knows to call the clinic with any problems, questions or concerns.   Wyvonnia Lora MD MS AAHIVMS [facility] Hematology/Oncology Physician [facility]  .*  Total Encounter Time as defined by the Centers for Medicare and Medicaid Services includes, in addition to the face-to-face time of a patient visit (documented in the note above) non-face-to-face time: obtaining and reviewing outside history, ordering and reviewing medications, tests or procedures, care coordination (communications with other health care professionals or caregivers) and documentation in the medical record.    I,Mitra Faeizi,acting as a Neurosurgeon for Wyvonnia Lora, MD.,have documented all relevant documentation on the behalf of Wyvonnia Lora, MD,as directed by  Wyvonnia Lora, MD while in the presence of Wyvonnia Lora, MD.  .I have reviewed the above documentation for accuracy and completeness, and I agree with the above. Johney Maine MD

## 2023-06-07 NOTE — Telephone Encounter (Signed)
Oral Oncology Patient Advocate Encounter   Was successful in securing patient a $ 4,500 grant from [employer] ([employer]) to provide copayment coverage for his Calquence.  This will keep the out of pocket expense at $0.    The billing information is as follows and has been shared with [facility].   Member ID: [id number] Group ID: [id number] RxBin: [id number] Dates of Eligibility: 06/07/23 through 06/06/24  Fund:  CLL  Jinger Neighbors, CPhT-Adv Oncology Pharmacy Patient Advocate [facility] Direct Number: [phone]  Fax: [phone]

## 2023-06-07 NOTE — Telephone Encounter (Signed)
Oral Oncology Patient Advocate Encounter  After completing a benefits investigation, prior authorization for Calquence is not required at this time through [facility].  Patient's copay is $1,921.38.     Jinger Neighbors, CPhT-Adv Oncology Pharmacy Patient Advocate [facility] Direct Number: [phone]  Fax: [phone]

## 2023-06-07 NOTE — Telephone Encounter (Signed)
Oral Oncology Pharmacist Encounter  Received new prescription for Calquence (acalabrutinib) for the treatment of CLL, planned duration until disease progression or unacceptable toxicity  CBC w/ differential, CMP, and LDH from 06/07/2023 assessed, no interventions needed at this time. Medication and dosage have been assessed for safety and appropriateness.  Current medication list in Epic reviewed, no clinically significant DDIs with Calquence identified.  Evaluated chart and no patient barriers to medication adherence noted.   Prescription has been e-scribed to the [facility] for benefits analysis and approval.  Oral Oncology Clinic will continue to follow for insurance authorization, copayment issues, initial counseling and start date.  Nicole Kindred, PharmD PGY1 Pharmacy Resident 06/07/2023 11:58 AM Oral Oncology Clinic [phone]

## 2023-06-09 ENCOUNTER — Other Ambulatory Visit: Payer: Self-pay

## 2023-06-09 ENCOUNTER — Other Ambulatory Visit: Payer: Self-pay | Admitting: Hematology

## 2023-06-09 MED ORDER — ONDANSETRON HCL 8 MG PO TABS: 8.0000 mg | ORAL_TABLET | Freq: Three times a day (TID) | ORAL | 0 refills | Status: DC | PRN

## 2023-06-09 MED ORDER — ALLOPURINOL 100 MG PO TABS: 100.0000 mg | ORAL_TABLET | Freq: Two times a day (BID) | ORAL | 0 refills | Status: DC

## 2023-06-09 NOTE — Progress Notes (Signed)
Oral Chemotherapy Pharmacist Encounter  Patient was counseled under telephone encounter from 06/07/23. Patient counseled on 06/09/23.  Lenord Carbo, PharmD, BCPS, BCOP Hematology/Oncology Clinical Pharmacist [facility] Oral Chemotherapy Navigation Clinics [phone] 06/09/2023 10:11 AM

## 2023-06-09 NOTE — Telephone Encounter (Addendum)
Oral Chemotherapy Pharmacist Encounter  Patient Education I spoke with patient for overview of new oral chemotherapy medication: Calquence (acalabrutinib) for the treatment of CLL, planned duration until disease progression or unacceptable toxicity.   Counseled patient on administration, dosing, side effects, monitoring, drug-food interactions, safe handling, storage, and disposal.  Patient will take 1 tablet (100mg ) by mouth once daily for 30 days, then if tolerated, will increase to 1 tablet (100mg ) twice daily.  Start date: 06/13/23  Side effects include but not limited to: headache, diarrhea, muscle or joint pain, fatigue, infection risk, decreased blood counts, increased bruising.   Headache: Can take Tylenol or drink some caffeine (coffee or soda). Diarrhea: Can take loperamide (up to 8 tablets per day). Patient is aware to call the office if he has 4 or more loose stools above his baseline in a day.  Reviewed with patient importance of keeping a medication schedule and plan for any missed doses.  After discussion with patient, no patient barriers to medication adherence identified.   Patient voiced understanding and appreciation. All questions answered. Medication handout to be provided in mail.  Provided patient with Oral Chemotherapy Navigation Clinic phone number. Patient knows to call the office with questions or concerns.  Nicole Kindred, PharmD PGY1 Pharmacy Resident 06/09/2023 10:08 AM

## 2023-06-09 NOTE — Progress Notes (Signed)
Specialty Pharmacy Initial Fill Coordination Note  Tony Banks is a 82 y.o. male contacted today regarding refills of specialty medication(s) Acalabrutinib Maleate .  Patient requested Delivery  on 06/12/23  to verified address [address]  [place] [address]   Medication will be filled on 06/09/23.   Patient is aware of $0 copayment.

## 2023-06-19 ENCOUNTER — Other Ambulatory Visit: Payer: Self-pay

## 2023-06-19 DIAGNOSIS — C9110 Chronic lymphocytic leukemia of B-cell type not having achieved remission: Secondary | ICD-10-CM

## 2023-06-20 ENCOUNTER — Encounter: Payer: Self-pay | Admitting: Medical Oncology

## 2023-06-20 ENCOUNTER — Inpatient Hospital Stay: Payer: Medicare HMO

## 2023-06-20 DIAGNOSIS — R61 Generalized hyperhidrosis: Secondary | ICD-10-CM | POA: Diagnosis not present

## 2023-06-20 DIAGNOSIS — R252 Cramp and spasm: Secondary | ICD-10-CM | POA: Diagnosis not present

## 2023-06-20 DIAGNOSIS — D649 Anemia, unspecified: Secondary | ICD-10-CM | POA: Diagnosis not present

## 2023-06-20 DIAGNOSIS — R7989 Other specified abnormal findings of blood chemistry: Secondary | ICD-10-CM | POA: Diagnosis not present

## 2023-06-20 DIAGNOSIS — Z87891 Personal history of nicotine dependence: Secondary | ICD-10-CM | POA: Diagnosis not present

## 2023-06-20 DIAGNOSIS — C9110 Chronic lymphocytic leukemia of B-cell type not having achieved remission: Secondary | ICD-10-CM | POA: Diagnosis not present

## 2023-06-20 DIAGNOSIS — E860 Dehydration: Secondary | ICD-10-CM | POA: Diagnosis not present

## 2023-06-20 DIAGNOSIS — N400 Enlarged prostate without lower urinary tract symptoms: Secondary | ICD-10-CM | POA: Diagnosis not present

## 2023-06-20 DIAGNOSIS — R944 Abnormal results of kidney function studies: Secondary | ICD-10-CM | POA: Diagnosis not present

## 2023-06-20 LAB — CMP (CANCER CENTER ONLY)
ALT: 23 U/L (ref 0–44)
AST: 27 U/L (ref 15–41)
Albumin: 5 g/dL (ref 3.5–5.0)
Alkaline Phosphatase: 113 U/L (ref 38–126)
Anion gap: 13 (ref 5–15)
BUN: 32 mg/dL — ABNORMAL HIGH (ref 8–23)
CO2: 20 mmol/L — ABNORMAL LOW (ref 22–32)
Calcium: 9.9 mg/dL (ref 8.9–10.3)
Chloride: 105 mmol/L (ref 98–111)
Creatinine: 1.67 mg/dL — ABNORMAL HIGH (ref 0.61–1.24)
GFR, Estimated: 41 mL/min — ABNORMAL LOW (ref >60)
Glucose, Bld: 129 mg/dL — ABNORMAL HIGH (ref 70–99)
Potassium: 4.7 mmol/L (ref 3.5–5.1)
Sodium: 138 mmol/L (ref 135–145)
Total Bilirubin: 1.1 mg/dL (ref 0.3–1.2)
Total Protein: 7.8 g/dL (ref 6.5–8.1)

## 2023-06-20 LAB — LACTATE DEHYDROGENASE: LDH: 180 U/L (ref 98–192)

## 2023-06-20 LAB — CBC WITH DIFFERENTIAL (CANCER CENTER ONLY)
Abs Immature Granulocytes: 0.67 10*3/uL — ABNORMAL HIGH (ref 0.00–0.07)
Basophils Absolute: 0.1 10*3/uL (ref 0.0–0.1)
Basophils Relative: 0 %
Eosinophils Absolute: 0.3 10*3/uL (ref 0.0–0.5)
Eosinophils Relative: 0 %
HCT: 32.7 % — ABNORMAL LOW (ref 39.0–52.0)
Hemoglobin: 8.3 g/dL — ABNORMAL LOW (ref 13.0–17.0)
Immature Granulocytes: 0 %
Lymphocytes Relative: 95 %
Lymphs Abs: 325.8 10*3/uL — ABNORMAL HIGH (ref 0.7–4.0)
MCH: 27.5 pg (ref 26.0–34.0)
MCHC: 25.4 g/dL — ABNORMAL LOW (ref 30.0–36.0)
MCV: 108.3 fL — ABNORMAL HIGH (ref 80.0–100.0)
Monocytes Absolute: 13.7 10*3/uL — ABNORMAL HIGH (ref 0.1–1.0)
Monocytes Relative: 4 %
Neutro Abs: 4.6 10*3/uL (ref 1.7–7.7)
Neutrophils Relative %: 1 %
Platelet Count: 108 10*3/uL — ABNORMAL LOW (ref 150–400)
RBC: 3.02 MIL/uL — ABNORMAL LOW (ref 4.22–5.81)
WBC Count: 345 10*3/uL (ref 4.0–10.5)
nRBC: 0 % (ref 0.0–0.2)

## 2023-06-20 NOTE — Progress Notes (Signed)
CRITICAL VALUE STICKER 06/19/2023  CRITICAL VALUE:WBC=345 k ( Lab only appt)  RECEIVER -D. Alvester Morin, RN  DATE & TIME NOTIFIED: 06/20/2023 @0814   MESSENGER  lab:-Marsha Sharl Ma  MD NOTIFIED: Candise Che  TIME OF NOTIFICATION:06/20/2023   RESPONSE:  Pt has CLL. WBC increased since  10/02 when it was 221.1  Next appt 10/22

## 2023-06-27 ENCOUNTER — Other Ambulatory Visit: Payer: Self-pay

## 2023-06-27 ENCOUNTER — Inpatient Hospital Stay: Payer: Medicare HMO

## 2023-06-27 ENCOUNTER — Inpatient Hospital Stay: Payer: Medicare HMO | Admitting: Hematology

## 2023-06-27 ENCOUNTER — Other Ambulatory Visit: Payer: Medicare HMO

## 2023-06-27 ENCOUNTER — Other Ambulatory Visit (HOSPITAL_COMMUNITY): Payer: Self-pay

## 2023-06-27 VITALS — BP 122/48 | HR 78 | Temp 97.5°F | Resp 16 | Wt 133.4 lb

## 2023-06-27 DIAGNOSIS — C9110 Chronic lymphocytic leukemia of B-cell type not having achieved remission: Secondary | ICD-10-CM

## 2023-06-27 DIAGNOSIS — E860 Dehydration: Secondary | ICD-10-CM | POA: Diagnosis not present

## 2023-06-27 DIAGNOSIS — Z87891 Personal history of nicotine dependence: Secondary | ICD-10-CM | POA: Diagnosis not present

## 2023-06-27 DIAGNOSIS — R252 Cramp and spasm: Secondary | ICD-10-CM | POA: Diagnosis not present

## 2023-06-27 DIAGNOSIS — D649 Anemia, unspecified: Secondary | ICD-10-CM | POA: Diagnosis not present

## 2023-06-27 DIAGNOSIS — N400 Enlarged prostate without lower urinary tract symptoms: Secondary | ICD-10-CM | POA: Diagnosis not present

## 2023-06-27 DIAGNOSIS — R61 Generalized hyperhidrosis: Secondary | ICD-10-CM | POA: Diagnosis not present

## 2023-06-27 DIAGNOSIS — R7989 Other specified abnormal findings of blood chemistry: Secondary | ICD-10-CM | POA: Diagnosis not present

## 2023-06-27 DIAGNOSIS — R944 Abnormal results of kidney function studies: Secondary | ICD-10-CM | POA: Diagnosis not present

## 2023-06-27 LAB — CBC WITH DIFFERENTIAL (CANCER CENTER ONLY)
Abs Immature Granulocytes: 0.55 10*3/uL — ABNORMAL HIGH (ref 0.00–0.07)
Basophils Absolute: 0.1 10*3/uL (ref 0.0–0.1)
Basophils Relative: 0 %
Eosinophils Absolute: 0.4 10*3/uL (ref 0.0–0.5)
Eosinophils Relative: 0 %
HCT: 27.2 % — ABNORMAL LOW (ref 39.0–52.0)
Hemoglobin: 7.3 g/dL — ABNORMAL LOW (ref 13.0–17.0)
Immature Granulocytes: 0 %
Lymphocytes Relative: 95 %
Lymphs Abs: 264.9 10*3/uL — ABNORMAL HIGH (ref 0.7–4.0)
MCH: 28.5 pg (ref 26.0–34.0)
MCHC: 26.8 g/dL — ABNORMAL LOW (ref 30.0–36.0)
MCV: 106.3 fL — ABNORMAL HIGH (ref 80.0–100.0)
Monocytes Absolute: 7.4 10*3/uL — ABNORMAL HIGH (ref 0.1–1.0)
Monocytes Relative: 3 %
Neutro Abs: 4.3 10*3/uL (ref 1.7–7.7)
Neutrophils Relative %: 2 %
Platelet Count: 138 10*3/uL — ABNORMAL LOW (ref 150–400)
RBC: 2.56 MIL/uL — ABNORMAL LOW (ref 4.22–5.81)
Smear Review: NORMAL
WBC Count: 277.6 10*3/uL (ref 4.0–10.5)
nRBC: 0 % (ref 0.0–0.2)

## 2023-06-27 LAB — CMP (CANCER CENTER ONLY)
ALT: 29 U/L (ref 0–44)
AST: 30 U/L (ref 15–41)
Albumin: 4.5 g/dL (ref 3.5–5.0)
Alkaline Phosphatase: 96 U/L (ref 38–126)
Anion gap: 8 (ref 5–15)
BUN: 33 mg/dL — ABNORMAL HIGH (ref 8–23)
CO2: 23 mmol/L (ref 22–32)
Calcium: 9.7 mg/dL (ref 8.9–10.3)
Chloride: 109 mmol/L (ref 98–111)
Creatinine: 1.28 mg/dL — ABNORMAL HIGH (ref 0.61–1.24)
GFR, Estimated: 56 mL/min — ABNORMAL LOW (ref >60)
Glucose, Bld: 98 mg/dL (ref 70–99)
Potassium: 4.5 mmol/L (ref 3.5–5.1)
Sodium: 140 mmol/L (ref 135–145)
Total Bilirubin: 0.7 mg/dL (ref 0.3–1.2)
Total Protein: 7 g/dL (ref 6.5–8.1)

## 2023-06-27 LAB — ABO/RH: ABO/RH(D): O POS

## 2023-06-27 LAB — URIC ACID: Uric Acid, Serum: 4.6 mg/dL (ref 3.7–8.6)

## 2023-06-27 LAB — PHOSPHORUS: Phosphorus: 4.1 mg/dL (ref 2.5–4.6)

## 2023-06-27 LAB — PREPARE RBC (CROSSMATCH)

## 2023-06-27 NOTE — Progress Notes (Signed)
HEMATOLOGY/ONCOLOGY CLINIC NOTE  Date of Service: 06/27/23   Patient Care Team: Jackie Plum, MD as PCP - General (Internal Medicine) Gabriel Carina, [facility] as Pharmacist (Pharmacist) [name], Lise Auer, RN as Oncology Nurse Navigator Lonie Peak, MD as Consulting Physician (Radiation Oncology) Johney Maine, MD as Consulting Physician (Hematology) Glennis Brink, MD as Referring Physician (Dermatology)  CHIEF COMPLAINTS/PURPOSE OF CONSULTATION:  Follow-up for continued evaluation and management of CLL  HISTORY OF PRESENTING ILLNESS:  Please see previous note for details of initial presentation  INTERVAL HISTORY:   Tony Banks is a 82 y.o. 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   Patient was last seen by me on 06/07/2023 and he complained of occasional night sweats, bilateral lower extremities cramps, and easy bruising.   Patient is accompanied by his wife during this visit. His blood pressure during this visit shows low diastolic pressure of 48, but normal systolic pressure of 122. He complains of increased fatigue for the past couple of weeks.   He regularly takes Calquence 100 mg as prescribed and has been tolerating it well. He denies mouth sores, nausea, diarrhea, or other toxicities. Patient took 2 Calquence tablets the first day and then for the past two weeks he takes Calquence 100mg  once a day.   Patient notes that his energy levels have been the same since our last visit. He also complains of leg soreness, which is not new.   Patient currently takes Vasotec 5 mg.   MEDICAL HISTORY:  Past Medical History:  Diagnosis Date   Aortic atherosclerosis (HCC)    CLL (chronic lymphocytic leukemia) (HCC)    Constipation    severe   Diabetes mellitus without complication (HCC)    Diverticulosis    HLD (hyperlipidemia)    Hypertension    Nephrolithiasis     SURGICAL HISTORY: Past Surgical History:  Procedure Laterality Date    FINGER FRACTURE SURGERY Right    middle finger   SHOULDER ARTHROSCOPY Bilateral    VASECTOMY      SOCIAL HISTORY: Social History   Socioeconomic History   Marital status: Married    Spouse name: Tony Banks   Number of children: 3   Years of education: 14   Highest education level: Some college, no degree  Occupational History   Occupation: retired  Tobacco Use   Smoking status: Former    Current packs/day: 0.00    Types: Cigarettes    Quit date: 07/31/1977    Years since quitting: 45.9   Smokeless tobacco: Never   Tobacco comments:    quit 30 yrs ago  Vaping Use   Vaping status: Never Used  Substance and Sexual Activity   Alcohol use: No   Drug use: No   Sexual activity: Not Currently    Birth control/protection: Surgical  Other Topics Concern   Not on file  Social History Narrative   Drinks 1-2 cups of coffee. Enjoys reading, watching tv and likes to ride his stationary bike.    Social Determinants of Health   Financial Resource Strain: Low Risk  (10/12/2020)   Overall Financial Resource Strain (CARDIA)    Difficulty of Paying Living Expenses: Not hard at all  Food Insecurity: No Food Insecurity (10/12/2020)   Hunger Vital Sign    Worried About Running Out of Food in the Last Year: Never true    Ran Out of Food in the Last Year: Never true  Transportation Needs: No Transportation Needs (10/12/2020)  PRAPARE - Administrator, Civil Service (Medical): No    Lack of Transportation (Non-Medical): No  Physical Activity: Sufficiently Active (10/12/2020)   Exercise Vital Sign    Days of Exercise per Week: 7 days    Minutes of Exercise per Session: 80 min  Stress: No Stress Concern Present (10/12/2020)   [employer] of Occupational Health - Occupational Stress Questionnaire    Feeling of Stress : Not at all  Social Connections: Moderately Isolated (10/12/2020)   Social Connection and Isolation Panel [NHANES]    Frequency of Communication with Friends and  Family: Once a week    Frequency of Social Gatherings with Friends and Family: Once a week    Attends Religious Services: More than 4 times per year    Active Member of [employer] or Organizations: No    Attends Banker Meetings: Never    Marital Status: Married  Catering manager Violence: Not At Risk (10/12/2020)   Humiliation, Afraid, Rape, and Kick questionnaire    Fear of Current or Ex-Partner: No    Emotionally Abused: No    Physically Abused: No    Sexually Abused: No    FAMILY HISTORY: Family History  Problem Relation Age of Onset   Heart disease Mother    Stroke Mother    Cancer Father    Stroke Maternal Grandfather    Colon cancer Neg Hx    Pancreatic cancer Neg Hx    Rectal cancer Neg Hx    Stomach cancer Neg Hx     ALLERGIES:  has No Known Allergies.  MEDICATIONS:  Current Outpatient Medications  Medication Sig Dispense Refill   acalabrutinib maleate (CALQUENCE) 100 MG tablet Take 1 tablet (100 mg total) by mouth 2 (two) times daily. 60 tablet 1   ACCU-CHEK AVIVA PLUS test strip USE DAILY OR UP TO 4 TIMES PER DAY AS DIRECTED). USE AS INSTRUCTED 200 strip PRN   ACCU-CHEK SOFTCLIX LANCETS lancets Use as instructed 100 each 12   allopurinol (ZYLOPRIM) 100 MG tablet Take 1 tablet (100 mg total) by mouth 2 (two) times daily. 60 tablet 0   AMBULATORY NON FORMULARY MEDICATION Accu-chek aviva plus test strips and accu-chek softclix lancets, use to check blood sugar up to four times a day. Dx Type 2 diabetes. 100 Units 3   atorvastatin (LIPITOR) 10 MG tablet Take 1 tablet (10 mg total) by mouth daily. 90 tablet 1   enalapril (VASOTEC) 5 MG tablet Take 1 tablet (5 mg total) by mouth daily. 90 tablet 1   Ferrous Sulfate (SLOW FE) 142 (45 Fe) MG TBCR Take 1 tablet by mouth daily. 30 tablet    magnesium 30 MG tablet Take 30 mg by mouth 2 (two) times daily.     metFORMIN (GLUCOPHAGE) 500 MG tablet TAKE 2 TABLETS BY MOUTH EVERY DAY WITH BREAKFAST (Patient taking  differently: Take 1,000 mg by mouth daily with breakfast.) 180 tablet 3   ondansetron (ZOFRAN) 8 MG tablet Take 1 tablet (8 mg total) by mouth every 8 (eight) hours as needed for nausea or vomiting. 20 tablet 0   polyethylene glycol (MIRALAX) packet Take 17 g by mouth daily. 28 each 0   No current facility-administered medications for this visit.    REVIEW OF SYSTEMS:    10 Point review of Systems was done is negative except as noted above.   PHYSICAL EXAMINATION: ECOG PERFORMANCE STATUS: 0 - Asymptomatic  . Vitals:   06/27/23 1115  BP: (!) 122/48  Pulse:  78  Resp: 16  Temp: (!) 97.5 F (36.4 C)  SpO2: 100%   Filed Weights   06/27/23 1115  Weight: 133 lb 6.4 oz (60.5 kg)   .Body mass index is 20.28 kg/m.    GENERAL:alert, in no acute distress and comfortable SKIN: no acute rashes, no significant lesions EYES: conjunctiva are pink and non-injected, sclera anicteric OROPHARYNX: MMM, no exudates, no oropharyngeal erythema or ulceration NECK: supple, no JVD LYMPH:  no palpable lymphadenopathy in the cervical, axillary or inguinal regions LUNGS: clear to auscultation b/l with normal respiratory effort HEART: regular rate & rhythm ABDOMEN:  normoactive bowel sounds , non tender, not distended. Extremity: no pedal edema PSYCH: alert & oriented x 3 with fluent speech NEURO: no focal motor/sensory deficits   LABORATORY DATA:  I have reviewed the data as listed  .    Latest Ref Rng & Units 06/27/2023   12:04 PM 06/20/2023    7:53 AM 06/07/2023    9:56 AM  CBC  WBC 4.0 - 10.5 K/uL 277.6  345.0  221.1   Hemoglobin 13.0 - 17.0 g/dL 7.3  8.3  8.6   Hematocrit 39.0 - 52.0 % 27.2  32.7  29.1   Platelets 150 - 400 K/uL 138  108  130    . CBC    Component Value Date/Time   WBC 277.6 (HH) 06/27/2023 1204   WBC 139.2 (HH) 11/15/2021 1345   RBC 2.56 (L) 06/27/2023 1204   HGB 7.3 (L) 06/27/2023 1204   HGB 12.8 (L) 05/29/2017 1141   HCT 27.2 (L) 06/27/2023 1204   HCT  38.7 05/29/2017 1141   PLT 138 (L) 06/27/2023 1204   PLT 151 05/29/2017 1141   MCV 106.3 (H) 06/27/2023 1204   MCV 91.3 05/29/2017 1141   MCH 28.5 06/27/2023 1204   MCHC 26.8 (L) 06/27/2023 1204   RDW Not Measured 06/27/2023 1204   RDW 13.8 05/29/2017 1141   LYMPHSABS 264.9 (H) 06/27/2023 1204   LYMPHSABS 8.8 (H) 05/29/2017 1141   MONOABS 7.4 (H) 06/27/2023 1204   MONOABS 0.7 05/29/2017 1141   EOSABS 0.4 06/27/2023 1204   EOSABS 0.1 05/29/2017 1141   BASOSABS 0.1 06/27/2023 1204   BASOSABS 0.0 05/29/2017 1141    .    Latest Ref Rng & Units 06/27/2023   12:04 PM 06/20/2023    7:53 AM 06/07/2023    9:56 AM  CMP  Glucose 70 - 99 mg/dL 98  161  97   BUN 8 - 23 mg/dL 33  32  24   Creatinine 0.61 - 1.24 mg/dL 0.96  0.45  4.09   Sodium 135 - 145 mmol/L 140  138  141   Potassium 3.5 - 5.1 mmol/L 4.5  4.7  4.5   Chloride 98 - 111 mmol/L 109  105  109   CO2 22 - 32 mmol/L 23  20  25    Calcium 8.9 - 10.3 mg/dL 9.7  9.9  9.7   Total Protein 6.5 - 8.1 g/dL 7.0  7.8  7.1   Total Bilirubin 0.3 - 1.2 mg/dL 0.7  1.1  0.8   Alkaline Phos 38 - 126 U/L 96  113  99   AST 15 - 41 U/L 30  27  21    ALT 0 - 44 U/L 29  23  13    06/08/17 FISH CLL Prognostic Panel:    06/08/17 Cytogenetics:   05/29/17 Peripheral blood flow cytometry:    RADIOGRAPHIC STUDIES: I have personally reviewed the radiological  images as listed and agreed with the findings in the report. No results found.  ASSESSMENT & PLAN:   82 y.o. male with  1. Chronic Lymphocytic Leukemia Rai 1  06/08/17 CLL FISH Prognostic panel negative for del17p, del11q, del13q, indicating intermediate risk 11/15/2019 CT C/A/P ([id number]) revealed gradual lymph node increase compared to 2018 CT. No major lymphoma progression. Mildly enlarged prostate.    2.  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 -Continue to monitor and manage per dermatology  PLAN: Discussed lab results from 06/20/2023  in detail  with the patient. CBC shows elevated WBC from 221 K to 345 K, patient is anemic with hemoglobin of 8.3 g/dL and hematocrit of 84.6%, and low platelets of 108 K. CMP shows elevated Bun at 32 and elevated creatinine of 1.67.  -Discussed with the patient that 06/20/2023 labs shows elevated WBC due to re-distribution of lymphocytes, which is the reason of lab after this visit.  -Patient will get lab-workup after this visit. Pt agrees.   -continue vitamin-D supplement.  -Recommend to stay well-hydrated.  -Continue Calquence 100 mg once day. -Discussed with the patient that his fatigue could be due to low diastolic pressure, anemia, or Calquence.   -Recommend to follow-up with PCP regarding low blood pressure.  -Recommend to check blood pressure at home.  -Discussed the option of blood transfusion if patient stays anemic with hemoglobin less than 8.0.  -If CMP shows elevated kidney numbers, we will hold Vasotec for a short period.  -Answered all of patient's questions,  -Repeat labs today and then repeat labs in 2 weeks.  FOLLOW-UP: Labs today Phone visit with Dr Candise Che in 2 weeks with labs the day before phone visit   The total time spent in the appointment was 30 minutes* .  All of the patient's questions were answered with apparent satisfaction. The patient knows to call the clinic with any problems, questions or concerns.   Wyvonnia Lora MD MS AAHIVMS [facility] Hematology/Oncology Physician [facility]  .*Total Encounter Time as defined by the Centers for Medicare and Medicaid Services includes, in addition to the face-to-face time of a patient visit (documented in the note above) non-face-to-face time: obtaining and reviewing outside history, ordering and reviewing medications, tests or procedures, care coordination (communications with other health care professionals or caregivers) and documentation in the medical record.   I,Param Shah,acting as a Neurosurgeon for Wyvonnia Lora,  MD.,have documented all relevant documentation on the behalf of Wyvonnia Lora, MD,as directed by  Wyvonnia Lora, MD while in the presence of Wyvonnia Lora, MD.  Rica Mote  .    Latest Ref Rng & Units 06/27/2023   12:04 PM 06/20/2023    7:53 AM 06/07/2023    9:56 AM  CBC  WBC 4.0 - 10.5 K/uL 277.6  345.0  221.1   Hemoglobin 13.0 - 17.0 g/dL 7.3  8.3  8.6   Hematocrit 39.0 - 52.0 % 27.2  32.7  29.1   Platelets 150 - 400 K/uL 138  108  130     - transfuse 1 unit of PRBC -WBC count starting to improve Creatinine back to baseline @ 1.28  .I have reviewed the above documentation for accuracy and completeness, and I agree with the above. Johney Maine MD

## 2023-06-28 ENCOUNTER — Inpatient Hospital Stay: Payer: Medicare HMO

## 2023-06-28 ENCOUNTER — Other Ambulatory Visit: Payer: Self-pay

## 2023-06-28 DIAGNOSIS — R61 Generalized hyperhidrosis: Secondary | ICD-10-CM | POA: Diagnosis not present

## 2023-06-28 DIAGNOSIS — C9110 Chronic lymphocytic leukemia of B-cell type not having achieved remission: Secondary | ICD-10-CM | POA: Diagnosis not present

## 2023-06-28 DIAGNOSIS — R252 Cramp and spasm: Secondary | ICD-10-CM | POA: Diagnosis not present

## 2023-06-28 DIAGNOSIS — R7989 Other specified abnormal findings of blood chemistry: Secondary | ICD-10-CM | POA: Diagnosis not present

## 2023-06-28 DIAGNOSIS — N400 Enlarged prostate without lower urinary tract symptoms: Secondary | ICD-10-CM | POA: Diagnosis not present

## 2023-06-28 DIAGNOSIS — R944 Abnormal results of kidney function studies: Secondary | ICD-10-CM | POA: Diagnosis not present

## 2023-06-28 DIAGNOSIS — E860 Dehydration: Secondary | ICD-10-CM | POA: Diagnosis not present

## 2023-06-28 DIAGNOSIS — D649 Anemia, unspecified: Secondary | ICD-10-CM | POA: Diagnosis not present

## 2023-06-28 DIAGNOSIS — Z87891 Personal history of nicotine dependence: Secondary | ICD-10-CM | POA: Diagnosis not present

## 2023-06-28 MED ORDER — ACETAMINOPHEN 325 MG PO TABS
650.0000 mg | ORAL_TABLET | Freq: Once | ORAL | Status: AC
  Administered 2023-06-28: 650 mg via ORAL
  Filled 2023-06-28: qty 2

## 2023-06-28 MED ORDER — SODIUM CHLORIDE 0.9% IV SOLUTION
250.0000 mL | INTRAVENOUS | Status: DC
  Administered 2023-06-28: 100 mL via INTRAVENOUS

## 2023-06-28 MED ORDER — METHYLPREDNISOLONE SODIUM SUCC 40 MG IJ SOLR
40.0000 mg | Freq: Once | INTRAMUSCULAR | Status: AC
  Administered 2023-06-28: 40 mg via INTRAVENOUS
  Filled 2023-06-28: qty 1

## 2023-06-28 NOTE — Patient Instructions (Signed)

## 2023-06-29 LAB — TYPE AND SCREEN
ABO/RH(D): O POS
Antibody Screen: NEGATIVE
Unit division: 0

## 2023-06-29 LAB — BPAM RBC
Blood Product Expiration Date: 202411212359
ISSUE DATE / TIME: 202410230825
Unit Type and Rh: 5100

## 2023-07-03 ENCOUNTER — Other Ambulatory Visit: Payer: Self-pay

## 2023-07-03 ENCOUNTER — Other Ambulatory Visit (HOSPITAL_COMMUNITY): Payer: Self-pay

## 2023-07-03 NOTE — Progress Notes (Signed)
Specialty Pharmacy Ongoing Clinical Assessment Note  Tony Banks is a 82 y.o. male who is being followed by the specialty pharmacy service for RxSp Oncology   Patient's specialty medication(s) reviewed today: Acalabrutinib Maleate  Patient states per provider recommendation he is only taking 1 tablet daily until provider informs him to increase to prescribed 2 tablets daily.   Missed doses in the last 4 weeks: 0   Patient/Caregiver did not have any additional questions or concerns.   Therapeutic benefit summary: Unable to assess   Adverse events/side effects summary: No adverse events/side effects   Patient's therapy is appropriate to: Continue    Goals Addressed             This Visit's Progress    Slow Disease Progression       Patient is unable to be assessed as therapy was recently initiated. Patient will maintain adherence          Follow up:  3 months  Otto Herb Specialty Pharmacist

## 2023-07-12 DIAGNOSIS — E782 Mixed hyperlipidemia: Secondary | ICD-10-CM | POA: Diagnosis not present

## 2023-07-12 DIAGNOSIS — I10 Essential (primary) hypertension: Secondary | ICD-10-CM | POA: Diagnosis not present

## 2023-07-12 DIAGNOSIS — E1152 Type 2 diabetes mellitus with diabetic peripheral angiopathy with gangrene: Secondary | ICD-10-CM | POA: Diagnosis not present

## 2023-07-13 ENCOUNTER — Inpatient Hospital Stay: Payer: Medicare HMO | Attending: Hematology

## 2023-07-13 ENCOUNTER — Other Ambulatory Visit: Payer: Self-pay

## 2023-07-13 DIAGNOSIS — Z87891 Personal history of nicotine dependence: Secondary | ICD-10-CM | POA: Insufficient documentation

## 2023-07-13 DIAGNOSIS — Z809 Family history of malignant neoplasm, unspecified: Secondary | ICD-10-CM | POA: Insufficient documentation

## 2023-07-13 DIAGNOSIS — I10 Essential (primary) hypertension: Secondary | ICD-10-CM | POA: Diagnosis not present

## 2023-07-13 DIAGNOSIS — C9110 Chronic lymphocytic leukemia of B-cell type not having achieved remission: Secondary | ICD-10-CM | POA: Diagnosis not present

## 2023-07-13 DIAGNOSIS — R5383 Other fatigue: Secondary | ICD-10-CM | POA: Insufficient documentation

## 2023-07-13 DIAGNOSIS — Z7984 Long term (current) use of oral hypoglycemic drugs: Secondary | ICD-10-CM | POA: Diagnosis not present

## 2023-07-13 DIAGNOSIS — Z79899 Other long term (current) drug therapy: Secondary | ICD-10-CM | POA: Diagnosis not present

## 2023-07-13 DIAGNOSIS — I700 Atherosclerosis of aorta: Secondary | ICD-10-CM | POA: Insufficient documentation

## 2023-07-13 DIAGNOSIS — K5900 Constipation, unspecified: Secondary | ICD-10-CM | POA: Insufficient documentation

## 2023-07-13 DIAGNOSIS — E119 Type 2 diabetes mellitus without complications: Secondary | ICD-10-CM | POA: Insufficient documentation

## 2023-07-13 DIAGNOSIS — E785 Hyperlipidemia, unspecified: Secondary | ICD-10-CM | POA: Diagnosis not present

## 2023-07-13 LAB — CMP (CANCER CENTER ONLY)
ALT: 15 U/L (ref 0–44)
AST: 16 U/L (ref 15–41)
Albumin: 4.1 g/dL (ref 3.5–5.0)
Alkaline Phosphatase: 81 U/L (ref 38–126)
Anion gap: 7 (ref 5–15)
BUN: 27 mg/dL — ABNORMAL HIGH (ref 8–23)
CO2: 23 mmol/L (ref 22–32)
Calcium: 9.1 mg/dL (ref 8.9–10.3)
Chloride: 111 mmol/L (ref 98–111)
Creatinine: 1.22 mg/dL (ref 0.61–1.24)
GFR, Estimated: 60 mL/min — ABNORMAL LOW (ref >60)
Glucose, Bld: 153 mg/dL — ABNORMAL HIGH (ref 70–99)
Potassium: 4.2 mmol/L (ref 3.5–5.1)
Sodium: 141 mmol/L (ref 135–145)
Total Bilirubin: 0.5 mg/dL (ref <1.2)
Total Protein: 6.3 g/dL — ABNORMAL LOW (ref 6.5–8.1)

## 2023-07-13 LAB — CBC WITH DIFFERENTIAL (CANCER CENTER ONLY)
Abs Immature Granulocytes: 0.47 10*3/uL — ABNORMAL HIGH (ref 0.00–0.07)
Basophils Absolute: 0.1 10*3/uL (ref 0.0–0.1)
Basophils Relative: 0 %
Eosinophils Absolute: 0.3 10*3/uL (ref 0.0–0.5)
Eosinophils Relative: 0 %
HCT: 28.3 % — ABNORMAL LOW (ref 39.0–52.0)
Hemoglobin: 7.6 g/dL — ABNORMAL LOW (ref 13.0–17.0)
Immature Granulocytes: 0 %
Lymphocytes Relative: 95 %
Lymphs Abs: 196.9 10*3/uL — ABNORMAL HIGH (ref 0.7–4.0)
MCH: 28.4 pg (ref 26.0–34.0)
MCHC: 26.9 g/dL — ABNORMAL LOW (ref 30.0–36.0)
MCV: 105.6 fL — ABNORMAL HIGH (ref 80.0–100.0)
Monocytes Absolute: 3.3 10*3/uL — ABNORMAL HIGH (ref 0.1–1.0)
Monocytes Relative: 2 %
Neutro Abs: 5.8 10*3/uL (ref 1.7–7.7)
Neutrophils Relative %: 3 %
Platelet Count: 106 10*3/uL — ABNORMAL LOW (ref 150–400)
RBC: 2.68 MIL/uL — ABNORMAL LOW (ref 4.22–5.81)
RDW: 18 % — ABNORMAL HIGH (ref 11.5–15.5)
Smear Review: NORMAL
WBC Count: 206.7 10*3/uL (ref 4.0–10.5)
nRBC: 0 % (ref 0.0–0.2)

## 2023-07-13 LAB — PREPARE RBC (CROSSMATCH)

## 2023-07-13 LAB — SAMPLE TO BLOOD BANK

## 2023-07-15 ENCOUNTER — Inpatient Hospital Stay: Payer: Medicare HMO

## 2023-07-15 DIAGNOSIS — C9110 Chronic lymphocytic leukemia of B-cell type not having achieved remission: Secondary | ICD-10-CM | POA: Diagnosis not present

## 2023-07-15 DIAGNOSIS — E119 Type 2 diabetes mellitus without complications: Secondary | ICD-10-CM | POA: Diagnosis not present

## 2023-07-15 DIAGNOSIS — Z87891 Personal history of nicotine dependence: Secondary | ICD-10-CM | POA: Diagnosis not present

## 2023-07-15 DIAGNOSIS — I700 Atherosclerosis of aorta: Secondary | ICD-10-CM | POA: Diagnosis not present

## 2023-07-15 DIAGNOSIS — R5383 Other fatigue: Secondary | ICD-10-CM | POA: Diagnosis not present

## 2023-07-15 DIAGNOSIS — I10 Essential (primary) hypertension: Secondary | ICD-10-CM | POA: Diagnosis not present

## 2023-07-15 DIAGNOSIS — E785 Hyperlipidemia, unspecified: Secondary | ICD-10-CM | POA: Diagnosis not present

## 2023-07-15 DIAGNOSIS — Z79899 Other long term (current) drug therapy: Secondary | ICD-10-CM | POA: Diagnosis not present

## 2023-07-15 DIAGNOSIS — K5900 Constipation, unspecified: Secondary | ICD-10-CM | POA: Diagnosis not present

## 2023-07-15 MED ORDER — ACETAMINOPHEN 325 MG PO TABS
650.0000 mg | ORAL_TABLET | Freq: Once | ORAL | Status: AC
  Administered 2023-07-15: 650 mg via ORAL
  Filled 2023-07-15: qty 2

## 2023-07-15 MED ORDER — METHYLPREDNISOLONE SODIUM SUCC 40 MG IJ SOLR
40.0000 mg | Freq: Once | INTRAMUSCULAR | Status: AC
  Administered 2023-07-15: 40 mg via INTRAVENOUS
  Filled 2023-07-15: qty 1

## 2023-07-15 MED ORDER — SODIUM CHLORIDE 0.9% IV SOLUTION
250.0000 mL | INTRAVENOUS | Status: DC
  Administered 2023-07-15: 100 mL via INTRAVENOUS

## 2023-07-16 LAB — TYPE AND SCREEN
ABO/RH(D): O POS
Antibody Screen: NEGATIVE
Unit division: 0

## 2023-07-16 LAB — BPAM RBC
Blood Product Expiration Date: 202411302359
ISSUE DATE / TIME: 202411091101
Unit Type and Rh: 5100

## 2023-07-17 ENCOUNTER — Inpatient Hospital Stay (HOSPITAL_BASED_OUTPATIENT_CLINIC_OR_DEPARTMENT_OTHER): Payer: Medicare HMO | Admitting: Hematology

## 2023-07-17 DIAGNOSIS — Z79899 Other long term (current) drug therapy: Secondary | ICD-10-CM | POA: Diagnosis not present

## 2023-07-17 DIAGNOSIS — C9110 Chronic lymphocytic leukemia of B-cell type not having achieved remission: Secondary | ICD-10-CM

## 2023-07-17 DIAGNOSIS — E119 Type 2 diabetes mellitus without complications: Secondary | ICD-10-CM | POA: Diagnosis not present

## 2023-07-17 DIAGNOSIS — R5383 Other fatigue: Secondary | ICD-10-CM | POA: Diagnosis not present

## 2023-07-17 DIAGNOSIS — Z87891 Personal history of nicotine dependence: Secondary | ICD-10-CM | POA: Diagnosis not present

## 2023-07-17 DIAGNOSIS — I10 Essential (primary) hypertension: Secondary | ICD-10-CM | POA: Diagnosis not present

## 2023-07-17 DIAGNOSIS — K5900 Constipation, unspecified: Secondary | ICD-10-CM | POA: Diagnosis not present

## 2023-07-17 DIAGNOSIS — E785 Hyperlipidemia, unspecified: Secondary | ICD-10-CM | POA: Diagnosis not present

## 2023-07-17 DIAGNOSIS — I700 Atherosclerosis of aorta: Secondary | ICD-10-CM | POA: Diagnosis not present

## 2023-07-17 NOTE — Progress Notes (Signed)
HEMATOLOGY/ONCOLOGY CLINIC NOTE  Date of Service: 07/17/23   Patient Care Team: Jackie Plum, MD as PCP - General (Internal Medicine) Gabriel Carina, [facility] as Pharmacist (Pharmacist) [name], Lise Auer, RN as Oncology Nurse Navigator Lonie Peak, MD as Consulting Physician (Radiation Oncology) Johney Maine, MD as Consulting Physician (Hematology) Glennis Brink, MD as Referring Physician (Dermatology)  CHIEF COMPLAINTS/PURPOSE OF CONSULTATION:  Follow-up for continued evaluation and management of CLL  HISTORY OF PRESENTING ILLNESS:  Please see previous note for details of initial presentation  INTERVAL HISTORY:   Tony Banks is a 82 y.o. 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   .I connected with Kendryck D Stoffel on 07/17/2023 at  3:30 PM EST by telephone visit and verified that I am speaking with the correct person using two identifiers.   Patient was last seen by me on 06/27/2023 and he complained of increased fatigue, but was doing well overall.   Patient notes he has been doing well overall since our last visit. He denies any new infection issues, fever, chills, night sweats, unexpected weight loss, chest pain, abdominal pain, back pain, or leg swelling.  He regularly takes Calquence 100 mg once daily and has been tolerating it well without any new or severe toxicities. He denies dizziness or lightheaded.   He does complain of mild fatigue or bruising. He notes he has been eating well and has been staying well-hydrated.   Discussed lab results from 07/13/2023 in detail with the patient.   I discussed the limitations, risks, security and privacy concerns of performing an evaluation and management service by telemedicine and the availability of in-person appointments. I also discussed with the patient that there may be a patient responsible charge related to this service. The patient expressed understanding and agreed to proceed.    Other persons participating in the visit and their role in the encounter: None   Patient's location: Home  Provider's location: [facility]   Chief Complaint: CLL     MEDICAL HISTORY:  Past Medical History:  Diagnosis Date   Aortic atherosclerosis (HCC)    CLL (chronic lymphocytic leukemia) (HCC)    Constipation    severe   Diabetes mellitus without complication (HCC)    Diverticulosis    HLD (hyperlipidemia)    Hypertension    Nephrolithiasis     SURGICAL HISTORY: Past Surgical History:  Procedure Laterality Date   FINGER FRACTURE SURGERY Right    middle finger   SHOULDER ARTHROSCOPY Bilateral    VASECTOMY      SOCIAL HISTORY: Social History   Socioeconomic History   Marital status: Married    Spouse name: [name]   Number of children: 3   Years of education: 14   Highest education level: Some college, no degree  Occupational History   Occupation: retired  Tobacco Use   Smoking status: Former    Current packs/day: 0.00    Types: Cigarettes    Quit date: 07/31/1977    Years since quitting: 45.9   Smokeless tobacco: Never   Tobacco comments:    quit 30 yrs ago  Vaping Use   Vaping status: Never Used  Substance and Sexual Activity   Alcohol use: No   Drug use: No   Sexual activity: Not Currently    Birth control/protection: Surgical  Other Topics Concern   Not on file  Social History Narrative   Drinks 1-2 cups of coffee. Enjoys reading, watching tv and likes to ride his  stationary bike.    Social Determinants of Health   Financial Resource Strain: Low Risk  (10/12/2020)   Overall Financial Resource Strain (CARDIA)    Difficulty of Paying Living Expenses: Not hard at all  Food Insecurity: No Food Insecurity (10/12/2020)   Hunger Vital Sign    Worried About Running Out of Food in the Last Year: Never true    Ran Out of Food in the Last Year: Never true  Transportation Needs: No Transportation Needs (10/12/2020)   PRAPARE - Scientist, research (physical sciences) (Medical): No    Lack of Transportation (Non-Medical): No  Physical Activity: Sufficiently Active (10/12/2020)   Exercise Vital Sign    Days of Exercise per Week: 7 days    Minutes of Exercise per Session: 80 min  Stress: No Stress Concern Present (10/12/2020)   [employer] of Occupational Health - Occupational Stress Questionnaire    Feeling of Stress : Not at all  Social Connections: Moderately Isolated (10/12/2020)   Social Connection and Isolation Panel [NHANES]    Frequency of Communication with Friends and Family: Once a week    Frequency of Social Gatherings with Friends and Family: Once a week    Attends Religious Services: More than 4 times per year    Active Member of [employer] or Organizations: No    Attends Banker Meetings: Never    Marital Status: Married  Catering manager Violence: Not At Risk (10/12/2020)   Humiliation, Afraid, Rape, and Kick questionnaire    Fear of Current or Ex-Partner: No    Emotionally Abused: No    Physically Abused: No    Sexually Abused: No    FAMILY HISTORY: Family History  Problem Relation Age of Onset   Heart disease Mother    Stroke Mother    Cancer Father    Stroke Maternal Grandfather    Colon cancer Neg Hx    Pancreatic cancer Neg Hx    Rectal cancer Neg Hx    Stomach cancer Neg Hx     ALLERGIES:  has No Known Allergies.  MEDICATIONS:  Current Outpatient Medications  Medication Sig Dispense Refill   acalabrutinib maleate (CALQUENCE) 100 MG tablet Take 1 tablet (100 mg total) by mouth 2 (two) times daily. 60 tablet 1   ACCU-CHEK AVIVA PLUS test strip USE DAILY OR UP TO 4 TIMES PER DAY AS DIRECTED). USE AS INSTRUCTED 200 strip PRN   ACCU-CHEK SOFTCLIX LANCETS lancets Use as instructed 100 each 12   allopurinol (ZYLOPRIM) 100 MG tablet Take 1 tablet (100 mg total) by mouth 2 (two) times daily. 60 tablet 0   AMBULATORY NON FORMULARY MEDICATION Accu-chek aviva plus test strips and accu-chek softclix  lancets, use to check blood sugar up to four times a day. Dx Type 2 diabetes. 100 Units 3   atorvastatin (LIPITOR) 10 MG tablet Take 1 tablet (10 mg total) by mouth daily. 90 tablet 1   enalapril (VASOTEC) 5 MG tablet Take 1 tablet (5 mg total) by mouth daily. 90 tablet 1   Ferrous Sulfate (SLOW FE) 142 (45 Fe) MG TBCR Take 1 tablet by mouth daily. 30 tablet    magnesium 30 MG tablet Take 30 mg by mouth 2 (two) times daily.     metFORMIN (GLUCOPHAGE) 500 MG tablet TAKE 2 TABLETS BY MOUTH EVERY DAY WITH BREAKFAST (Patient taking differently: Take 1,000 mg by mouth daily with breakfast.) 180 tablet 3   ondansetron (ZOFRAN) 8 MG tablet Take 1 tablet (8  mg total) by mouth every 8 (eight) hours as needed for nausea or vomiting. 20 tablet 0   polyethylene glycol (MIRALAX) packet Take 17 g by mouth daily. 28 each 0   No current facility-administered medications for this visit.    REVIEW OF SYSTEMS:    10 Point review of Systems was done is negative except as noted above.   PHYSICAL EXAMINATION:telemdicine visit  LABORATORY DATA:  I have reviewed the data as listed  .    Latest Ref Rng & Units 07/13/2023    2:25 PM 06/27/2023   12:04 PM 06/20/2023    7:53 AM  CBC  WBC 4.0 - 10.5 K/uL 206.7  277.6  345.0   Hemoglobin 13.0 - 17.0 g/dL 7.6  7.3  8.3   Hematocrit 39.0 - 52.0 % 28.3  27.2  32.7   Platelets 150 - 400 K/uL 106  138  108    . CBC    Component Value Date/Time   WBC 206.7 (HH) 07/13/2023 1425   WBC 139.2 (HH) 11/15/2021 1345   RBC 2.68 (L) 07/13/2023 1425   HGB 7.6 (L) 07/13/2023 1425   HGB 12.8 (L) 05/29/2017 1141   HCT 28.3 (L) 07/13/2023 1425   HCT 38.7 05/29/2017 1141   PLT 106 (L) 07/13/2023 1425   PLT 151 05/29/2017 1141   MCV 105.6 (H) 07/13/2023 1425   MCV 91.3 05/29/2017 1141   MCH 28.4 07/13/2023 1425   MCHC 26.9 (L) 07/13/2023 1425   RDW 18.0 (H) 07/13/2023 1425   RDW 13.8 05/29/2017 1141   LYMPHSABS 196.9 (H) 07/13/2023 1425   LYMPHSABS 8.8 (H)  05/29/2017 1141   MONOABS 3.3 (H) 07/13/2023 1425   MONOABS 0.7 05/29/2017 1141   EOSABS 0.3 07/13/2023 1425   EOSABS 0.1 05/29/2017 1141   BASOSABS 0.1 07/13/2023 1425   BASOSABS 0.0 05/29/2017 1141    .    Latest Ref Rng & Units 07/13/2023    2:25 PM 06/27/2023   12:04 PM 06/20/2023    7:53 AM  CMP  Glucose 70 - 99 mg/dL 161  98  096   BUN 8 - 23 mg/dL 27  33  32   Creatinine 0.61 - 1.24 mg/dL 0.45  4.09  8.11   Sodium 135 - 145 mmol/L 141  140  138   Potassium 3.5 - 5.1 mmol/L 4.2  4.5  4.7   Chloride 98 - 111 mmol/L 111  109  105   CO2 22 - 32 mmol/L 23  23  20    Calcium 8.9 - 10.3 mg/dL 9.1  9.7  9.9   Total Protein 6.5 - 8.1 g/dL 6.3  7.0  7.8   Total Bilirubin <1.2 mg/dL 0.5  0.7  1.1   Alkaline Phos 38 - 126 U/L 81  96  113   AST 15 - 41 U/L 16  30  27    ALT 0 - 44 U/L 15  29  23    06/08/17 FISH CLL Prognostic Panel:    06/08/17 Cytogenetics:   05/29/17 Peripheral blood flow cytometry:    RADIOGRAPHIC STUDIES: I have personally reviewed the radiological images as listed and agreed with the findings in the report. No results found.  ASSESSMENT & PLAN:   82 y.o. male with  1. Chronic Lymphocytic Leukemia Rai 1  06/08/17 CLL FISH Prognostic panel negative for del17p, del11q, del13q, indicating intermediate risk 11/15/2019 CT C/A/P ([id number]) revealed gradual lymph node increase compared to 2018 CT. No major lymphoma progression. Mildly enlarged prostate.  2.  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 -Continue to monitor and manage per dermatology  PLAN: -Discussed lab results from 07/13/2023 in detail with the patient . CBC shows elevated but improved WBC at 206.7 K, low hemoglobin of 7.6 g/dL with low hematocrit of 28.3%, and low platelets of 106 K. CMP shows elevated BUN of 27 and slightly decreased total protein level at 6.3.  -Continue Calquence 100 mg once daily.  -continue vitamin-D supplement.  -Recommend to stay  well-hydrated.  -Answered all of patient's questions,  -Repeat labs in 3 weeks with visit.   FOLLOW-UP: RTC with Dr. Candise Che with labs in 3 weeks.   The total time spent in the appointment was 20 minutes* .  All of the patient's questions were answered with apparent satisfaction. The patient knows to call the clinic with any problems, questions or concerns.   Wyvonnia Lora MD MS AAHIVMS [facility] Hematology/Oncology Physician [facility]  .*Total Encounter Time as defined by the Centers for Medicare and Medicaid Services includes, in addition to the face-to-face time of a patient visit (documented in the note above) non-face-to-face time: obtaining and reviewing outside history, ordering and reviewing medications, tests or procedures, care coordination (communications with other health care professionals or caregivers) and documentation in the medical record.   I,Param Shah,acting as a Neurosurgeon for Wyvonnia Lora, MD.,have documented all relevant documentation on the behalf of Wyvonnia Lora, MD,as directed by  Wyvonnia Lora, MD while in the presence of Wyvonnia Lora, MD.  .I have reviewed the above documentation for accuracy and completeness, and I agree with the above. Johney Maine MD

## 2023-07-20 ENCOUNTER — Telehealth: Payer: Self-pay | Admitting: Hematology

## 2023-07-20 NOTE — Telephone Encounter (Signed)
Left patient a message in regards to scheduled appointment times/dates; left callback if needed for rescheduling

## 2023-07-26 ENCOUNTER — Other Ambulatory Visit: Payer: Self-pay

## 2023-07-26 ENCOUNTER — Other Ambulatory Visit (HOSPITAL_COMMUNITY): Payer: Self-pay

## 2023-07-26 NOTE — Progress Notes (Signed)
Specialty Pharmacy Refill Coordination Note  Tony Banks is a 82 y.o. male contacted today regarding refills of specialty medication(s) Acalabrutinib Maleate   Patient requested Delivery   Delivery date: 08/02/23   Verified address: [address] [place], [place] [address]   Medication will be filled on 08/01/2023.   Patient taking 100 mg every day per MD instructions but Rx SIG is for 100 mg BID. MD may change to BID following 12/06 appointment.

## 2023-07-31 ENCOUNTER — Other Ambulatory Visit: Payer: Self-pay

## 2023-07-31 ENCOUNTER — Other Ambulatory Visit (HOSPITAL_COMMUNITY): Payer: Self-pay

## 2023-07-31 DIAGNOSIS — C9110 Chronic lymphocytic leukemia of B-cell type not having achieved remission: Secondary | ICD-10-CM

## 2023-07-31 MED ORDER — ALLOPURINOL 100 MG PO TABS
100.0000 mg | ORAL_TABLET | Freq: Two times a day (BID) | ORAL | 0 refills | Status: DC
  Filled 2023-07-31 (×2): qty 60, 30d supply, fill #0

## 2023-08-01 ENCOUNTER — Other Ambulatory Visit: Payer: Self-pay

## 2023-08-01 ENCOUNTER — Other Ambulatory Visit (HOSPITAL_COMMUNITY): Payer: Self-pay

## 2023-08-10 ENCOUNTER — Other Ambulatory Visit: Payer: Self-pay

## 2023-08-10 DIAGNOSIS — C9110 Chronic lymphocytic leukemia of B-cell type not having achieved remission: Secondary | ICD-10-CM

## 2023-08-11 ENCOUNTER — Inpatient Hospital Stay: Payer: Medicare HMO | Admitting: Hematology

## 2023-08-11 ENCOUNTER — Inpatient Hospital Stay: Payer: Medicare HMO | Attending: Hematology

## 2023-08-11 VITALS — BP 126/51 | HR 76 | Temp 98.1°F | Resp 18 | Wt 135.5 lb

## 2023-08-11 DIAGNOSIS — R252 Cramp and spasm: Secondary | ICD-10-CM | POA: Diagnosis not present

## 2023-08-11 DIAGNOSIS — D649 Anemia, unspecified: Secondary | ICD-10-CM | POA: Diagnosis not present

## 2023-08-11 DIAGNOSIS — C9110 Chronic lymphocytic leukemia of B-cell type not having achieved remission: Secondary | ICD-10-CM | POA: Diagnosis not present

## 2023-08-11 DIAGNOSIS — Z87891 Personal history of nicotine dependence: Secondary | ICD-10-CM | POA: Diagnosis not present

## 2023-08-11 DIAGNOSIS — N400 Enlarged prostate without lower urinary tract symptoms: Secondary | ICD-10-CM | POA: Diagnosis not present

## 2023-08-11 LAB — CBC WITH DIFFERENTIAL (CANCER CENTER ONLY)
Abs Immature Granulocytes: 0.32 10*3/uL — ABNORMAL HIGH (ref 0.00–0.07)
Basophils Absolute: 0.1 10*3/uL (ref 0.0–0.1)
Basophils Relative: 0 %
Eosinophils Absolute: 0.1 10*3/uL (ref 0.0–0.5)
Eosinophils Relative: 0 %
HCT: 36.4 % — ABNORMAL LOW (ref 39.0–52.0)
Hemoglobin: 10 g/dL — ABNORMAL LOW (ref 13.0–17.0)
Immature Granulocytes: 0 %
Lymphocytes Relative: 96 %
Lymphs Abs: 222 10*3/uL — ABNORMAL HIGH (ref 0.7–4.0)
MCH: 28 pg (ref 26.0–34.0)
MCHC: 27.5 g/dL — ABNORMAL LOW (ref 30.0–36.0)
MCV: 102 fL — ABNORMAL HIGH (ref 80.0–100.0)
Monocytes Absolute: 3.6 10*3/uL — ABNORMAL HIGH (ref 0.1–1.0)
Monocytes Relative: 2 %
Neutro Abs: 4.7 10*3/uL (ref 1.7–7.7)
Neutrophils Relative %: 2 %
Platelet Count: 185 10*3/uL (ref 150–400)
RBC: 3.57 MIL/uL — ABNORMAL LOW (ref 4.22–5.81)
RDW: 17.8 % — ABNORMAL HIGH (ref 11.5–15.5)
Smear Review: NORMAL
WBC Count: 230.8 10*3/uL (ref 4.0–10.5)
nRBC: 0 % (ref 0.0–0.2)

## 2023-08-11 LAB — CMP (CANCER CENTER ONLY)
ALT: 14 U/L (ref 0–44)
AST: 16 U/L (ref 15–41)
Albumin: 4.5 g/dL (ref 3.5–5.0)
Alkaline Phosphatase: 97 U/L (ref 38–126)
Anion gap: 7 (ref 5–15)
BUN: 30 mg/dL — ABNORMAL HIGH (ref 8–23)
CO2: 27 mmol/L (ref 22–32)
Calcium: 9.8 mg/dL (ref 8.9–10.3)
Chloride: 108 mmol/L (ref 98–111)
Creatinine: 1.13 mg/dL (ref 0.61–1.24)
GFR, Estimated: >60 mL/min (ref >60)
Glucose, Bld: 121 mg/dL — ABNORMAL HIGH (ref 70–99)
Potassium: 4.6 mmol/L (ref 3.5–5.1)
Sodium: 142 mmol/L (ref 135–145)
Total Bilirubin: 0.5 mg/dL (ref <1.2)
Total Protein: 7 g/dL (ref 6.5–8.1)

## 2023-08-11 LAB — SAMPLE TO BLOOD BANK

## 2023-08-11 NOTE — Progress Notes (Signed)
HEMATOLOGY/ONCOLOGY CLINIC NOTE  Date of Service: 08/11/23   Patient Care Team: Jackie Plum, MD as PCP - General (Internal Medicine) Gabriel Carina, [facility] (Inactive) as Pharmacist (Pharmacist) [name], Lise Auer, RN as Oncology Nurse Navigator Lonie Peak, MD as Consulting Physician (Radiation Oncology) Johney Maine, MD as Consulting Physician (Hematology) Glennis Brink, MD as Referring Physician (Dermatology)  CHIEF COMPLAINTS/PURPOSE OF CONSULTATION:  Follow-up for continued evaluation and management of CLL  HISTORY OF PRESENTING ILLNESS:  Please see previous note for details of initial presentation  INTERVAL HISTORY:   Tony Banks is a 82 y.o. 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 I had a phone visit with patient on 07/17/2023 and reported mild fatigue and bruising.   Today, he is accompanied by his wife. Patient reports fluctuating energy levels but notes that he feels fell overall compared to previously. He reports that when he feels welll, he is able to engage in some yard work. Patient complains of persistent easy bleeding/bruising after engaging in yard work.   Patient denies any new lumps/bumps, leg swelling, abdominal pain, or diarrhea. He complains of occasional bilateral leg cramping.   He has been taking Calquence 100 mg once daily and has been tolerating it well without any new or severe toxicities.   Patient reports that he has received the flu and COVID-19 vaccines recently, but has not gotten the RSV vaccine.   MEDICAL HISTORY:  Past Medical History:  Diagnosis Date   Aortic atherosclerosis (HCC)    CLL (chronic lymphocytic leukemia) (HCC)    Constipation    severe   Diabetes mellitus without complication (HCC)    Diverticulosis    HLD (hyperlipidemia)    Hypertension    Nephrolithiasis     SURGICAL HISTORY: Past Surgical History:  Procedure Laterality Date   FINGER FRACTURE SURGERY Right    middle  finger   SHOULDER ARTHROSCOPY Bilateral    VASECTOMY      SOCIAL HISTORY: Social History   Socioeconomic History   Marital status: Married    Spouse name: [name]   Number of children: 3   Years of education: 14   Highest education level: Some college, no degree  Occupational History   Occupation: retired  Tobacco Use   Smoking status: Former    Current packs/day: 0.00    Types: Cigarettes    Quit date: 07/31/1977    Years since quitting: 46.0   Smokeless tobacco: Never   Tobacco comments:    quit 30 yrs ago  Vaping Use   Vaping status: Never Used  Substance and Sexual Activity   Alcohol use: No   Drug use: No   Sexual activity: Not Currently    Birth control/protection: Surgical  Other Topics Concern   Not on file  Social History Narrative   Drinks 1-2 cups of coffee. Enjoys reading, watching tv and likes to ride his stationary bike.    Social Determinants of Health   Financial Resource Strain: Low Risk  (10/12/2020)   Overall Financial Resource Strain (CARDIA)    Difficulty of Paying Living Expenses: Not hard at all  Food Insecurity: No Food Insecurity (10/12/2020)   Hunger Vital Sign    Worried About Running Out of Food in the Last Year: Never true    Ran Out of Food in the Last Year: Never true  Transportation Needs: No Transportation Needs (10/12/2020)   PRAPARE - Administrator, Civil Service (Medical): No  Lack of Transportation (Non-Medical): No  Physical Activity: Sufficiently Active (10/12/2020)   Exercise Vital Sign    Days of Exercise per Week: 7 days    Minutes of Exercise per Session: 80 min  Stress: No Stress Concern Present (10/12/2020)   [employer] of Occupational Health - Occupational Stress Questionnaire    Feeling of Stress : Not at all  Social Connections: Moderately Isolated (10/12/2020)   Social Connection and Isolation Panel [NHANES]    Frequency of Communication with Friends and Family: Once a week    Frequency of Social  Gatherings with Friends and Family: Once a week    Attends Religious Services: More than 4 times per year    Active Member of [employer] or Organizations: No    Attends Banker Meetings: Never    Marital Status: Married  Catering manager Violence: Not At Risk (10/12/2020)   Humiliation, Afraid, Rape, and Kick questionnaire    Fear of Current or Ex-Partner: No    Emotionally Abused: No    Physically Abused: No    Sexually Abused: No    FAMILY HISTORY: Family History  Problem Relation Age of Onset   Heart disease Mother    Stroke Mother    Cancer Father    Stroke Maternal Grandfather    Colon cancer Neg Hx    Pancreatic cancer Neg Hx    Rectal cancer Neg Hx    Stomach cancer Neg Hx     ALLERGIES:  has No Known Allergies.  MEDICATIONS:  Current Outpatient Medications  Medication Sig Dispense Refill   acalabrutinib maleate (CALQUENCE) 100 MG tablet Take 1 tablet (100 mg total) by mouth 2 (two) times daily. 60 tablet 1   ACCU-CHEK AVIVA PLUS test strip USE DAILY OR UP TO 4 TIMES PER DAY AS DIRECTED). USE AS INSTRUCTED 200 strip PRN   ACCU-CHEK SOFTCLIX LANCETS lancets Use as instructed 100 each 12   allopurinol (ZYLOPRIM) 100 MG tablet Take 1 tablet (100 mg total) by mouth 2 (two) times daily. 60 tablet 0   AMBULATORY NON FORMULARY MEDICATION Accu-chek aviva plus test strips and accu-chek softclix lancets, use to check blood sugar up to four times a day. Dx Type 2 diabetes. 100 Units 3   atorvastatin (LIPITOR) 10 MG tablet Take 1 tablet (10 mg total) by mouth daily. 90 tablet 1   enalapril (VASOTEC) 5 MG tablet Take 1 tablet (5 mg total) by mouth daily. 90 tablet 1   Ferrous Sulfate (SLOW FE) 142 (45 Fe) MG TBCR Take 1 tablet by mouth daily. 30 tablet    magnesium 30 MG tablet Take 30 mg by mouth 2 (two) times daily.     metFORMIN (GLUCOPHAGE) 500 MG tablet TAKE 2 TABLETS BY MOUTH EVERY DAY WITH BREAKFAST (Patient taking differently: Take 1,000 mg by mouth daily with  breakfast.) 180 tablet 3   ondansetron (ZOFRAN) 8 MG tablet Take 1 tablet (8 mg total) by mouth every 8 (eight) hours as needed for nausea or vomiting. 20 tablet 0   polyethylene glycol (MIRALAX) packet Take 17 g by mouth daily. 28 each 0   No current facility-administered medications for this visit.    REVIEW OF SYSTEMS:    10 Point review of Systems was done is negative except as noted above.   PHYSICAL EXAMINATION: .BP (!) 126/51   Pulse 76   Temp 98.1 F (36.7 C)   Resp 18   Wt 135 lb 8 oz (61.5 kg)   SpO2 100%  BMI 20.60 kg/m  GENERAL:alert, in no acute distress and comfortable SKIN: no acute rashes, no significant lesions EYES: conjunctiva are pink and non-injected, sclera anicteric OROPHARYNX: MMM, no exudates, no oropharyngeal erythema or ulceration NECK: supple, no JVD LYMPH:  no palpable lymphadenopathy in the cervical, axillary or inguinal regions LUNGS: clear to auscultation b/l with normal respiratory effort HEART: regular rate & rhythm ABDOMEN:  normoactive bowel sounds , non tender, not distended. Extremity: no pedal edema PSYCH: alert & oriented x 3 with fluent speech NEURO: no focal motor/sensory deficits   LABORATORY DATA:  I have reviewed the data as listed  .    Latest Ref Rng & Units 07/13/2023    2:25 PM 06/27/2023   12:04 PM 06/20/2023    7:53 AM  CBC  WBC 4.0 - 10.5 K/uL 206.7  277.6  345.0   Hemoglobin 13.0 - 17.0 g/dL 7.6  7.3  8.3   Hematocrit 39.0 - 52.0 % 28.3  27.2  32.7   Platelets 150 - 400 K/uL 106  138  108    . CBC    Component Value Date/Time   WBC 206.7 (HH) 07/13/2023 1425   WBC 139.2 (HH) 11/15/2021 1345   RBC 2.68 (L) 07/13/2023 1425   HGB 7.6 (L) 07/13/2023 1425   HGB 12.8 (L) 05/29/2017 1141   HCT 28.3 (L) 07/13/2023 1425   HCT 38.7 05/29/2017 1141   PLT 106 (L) 07/13/2023 1425   PLT 151 05/29/2017 1141   MCV 105.6 (H) 07/13/2023 1425   MCV 91.3 05/29/2017 1141   MCH 28.4 07/13/2023 1425   MCHC 26.9 (L)  07/13/2023 1425   RDW 18.0 (H) 07/13/2023 1425   RDW 13.8 05/29/2017 1141   LYMPHSABS 196.9 (H) 07/13/2023 1425   LYMPHSABS 8.8 (H) 05/29/2017 1141   MONOABS 3.3 (H) 07/13/2023 1425   MONOABS 0.7 05/29/2017 1141   EOSABS 0.3 07/13/2023 1425   EOSABS 0.1 05/29/2017 1141   BASOSABS 0.1 07/13/2023 1425   BASOSABS 0.0 05/29/2017 1141    .    Latest Ref Rng & Units 07/13/2023    2:25 PM 06/27/2023   12:04 PM 06/20/2023    7:53 AM  CMP  Glucose 70 - 99 mg/dL 782  98  956   BUN 8 - 23 mg/dL 27  33  32   Creatinine 0.61 - 1.24 mg/dL 2.13  0.86  5.78   Sodium 135 - 145 mmol/L 141  140  138   Potassium 3.5 - 5.1 mmol/L 4.2  4.5  4.7   Chloride 98 - 111 mmol/L 111  109  105   CO2 22 - 32 mmol/L 23  23  20    Calcium 8.9 - 10.3 mg/dL 9.1  9.7  9.9   Total Protein 6.5 - 8.1 g/dL 6.3  7.0  7.8   Total Bilirubin <1.2 mg/dL 0.5  0.7  1.1   Alkaline Phos 38 - 126 U/L 81  96  113   AST 15 - 41 U/L 16  30  27    ALT 0 - 44 U/L 15  29  23    06/08/17 FISH CLL Prognostic Panel:    06/08/17 Cytogenetics:   05/29/17 Peripheral blood flow cytometry:    RADIOGRAPHIC STUDIES: I have personally reviewed the radiological images as listed and agreed with the findings in the report. No results found.  ASSESSMENT & PLAN:   82 y.o. male with  1. Chronic Lymphocytic Leukemia Rai 1  06/08/17 CLL FISH Prognostic panel negative for  del17p, del11q, del13q, indicating intermediate risk 11/15/2019 CT C/A/P ([id number]) revealed gradual lymph node increase compared to 2018 CT. No major lymphoma progression. Mildly enlarged prostate.    2.  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 -Continue to monitor and manage per dermatology  PLAN:  -Discussed lab results on 08/11/23 in detail with patient. CBC showed WBC of 230.8K, hemoglobin of 10.0, and platelets of 185K. -platelets normalized from previously 106K one month ago -Hgb improved from 7.3 on 06/27/2023 to 10.0  currently -there is likely to be some lymphocyte redistribution from acalabrutinib. Elevated WBCs are not significantly concerning at this time.  -patient has tolerated Calquence 100 mg once daily with no new or severe toxicities.  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  All of the patient's questions were answered with apparent satisfaction. The patient knows to call the clinic with any problems, questions or concerns.   Wyvonnia Lora MD MS AAHIVMS [facility] Hematology/Oncology Physician [name]  .*Total Encounter Time as defined by the Centers for Medicare and Medicaid Services includes, in addition to the face-to-face time of a patient visit (documented in the note above) non-face-to-face time: obtaining and reviewing outside history, ordering and reviewing medications, tests or procedures, care coordination (communications with other health care professionals or caregivers) and documentation in the medical record.    I,Mitra Faeizi,acting as a Neurosurgeon for Wyvonnia Lora, MD.,have documented all relevant documentation on the behalf of Wyvonnia Lora, MD,as directed by  Wyvonnia Lora, MD while in the presence of Wyvonnia Lora, MD.  .I have reviewed the above documentation for  accuracy and completeness, and I agree with the above. Johney Maine MD

## 2023-08-14 ENCOUNTER — Telehealth: Payer: Self-pay | Admitting: Hematology

## 2023-08-14 NOTE — Telephone Encounter (Signed)
 Left patient a vm regarding upcoming appointment

## 2023-08-15 ENCOUNTER — Other Ambulatory Visit (HOSPITAL_COMMUNITY): Payer: Self-pay

## 2023-08-22 ENCOUNTER — Other Ambulatory Visit: Payer: Self-pay

## 2023-08-24 ENCOUNTER — Other Ambulatory Visit: Payer: Self-pay | Admitting: Hematology

## 2023-08-24 ENCOUNTER — Other Ambulatory Visit (HOSPITAL_COMMUNITY): Payer: Self-pay

## 2023-08-24 ENCOUNTER — Other Ambulatory Visit: Payer: Self-pay

## 2023-08-24 DIAGNOSIS — C9110 Chronic lymphocytic leukemia of B-cell type not having achieved remission: Secondary | ICD-10-CM

## 2023-08-24 MED ORDER — ALLOPURINOL 100 MG PO TABS
100.0000 mg | ORAL_TABLET | Freq: Two times a day (BID) | ORAL | 0 refills | Status: DC
  Filled 2023-08-31: qty 60, 30d supply, fill #0

## 2023-08-24 MED ORDER — CALQUENCE 100 MG PO TABS
100.0000 mg | ORAL_TABLET | Freq: Two times a day (BID) | ORAL | 1 refills | Status: DC
  Filled 2023-08-24: qty 60, 30d supply, fill #0
  Filled 2023-09-22: qty 60, 30d supply, fill #1

## 2023-08-24 NOTE — Progress Notes (Signed)
Specialty Pharmacy Refill Coordination Note  Tony Banks is a 82 y.o. male contacted today regarding refills of specialty medication(s) Acalabrutinib Maleate (CALQUENCE)   Patient requested Delivery   Delivery date: 09/01/23   Verified address: [address]  [place] [address]   Medication will be filled on 08/31/23 *Pending refill request.

## 2023-08-24 NOTE — Progress Notes (Signed)
Specialty Pharmacy Ongoing Clinical Assessment Note  Tony Banks is a 82 y.o. male who is being followed by the specialty pharmacy service for RxSp Oncology   Patient's specialty medication(s) reviewed today: Acalabrutinib Maleate (CALQUENCE)   Missed doses in the last 4 weeks: 0   Patient/Caregiver did not have any additional questions or concerns.   Therapeutic benefit summary: Patient is achieving benefit   Adverse events/side effects summary: Experienced adverse events/side effects (joint pain, tolerable at this time)   Patient's therapy is appropriate to: Continue    Goals Addressed             This Visit's Progress    Slow Disease Progression   On track    Patient is unable to be assessed as therapy was recently initiated. Patient will maintain adherence          Follow up:  3 months  Servando Snare Specialty Pharmacist

## 2023-08-31 ENCOUNTER — Other Ambulatory Visit (HOSPITAL_COMMUNITY): Payer: Self-pay

## 2023-08-31 ENCOUNTER — Other Ambulatory Visit: Payer: Self-pay

## 2023-09-14 ENCOUNTER — Telehealth: Payer: Self-pay | Admitting: Hematology

## 2023-09-14 NOTE — Telephone Encounter (Signed)
 Rescheduled appointments per provider on call. Patient is aware of the changes made and will be mailed an appointment reminder.

## 2023-09-22 ENCOUNTER — Other Ambulatory Visit: Payer: Self-pay | Admitting: Hematology

## 2023-09-22 ENCOUNTER — Other Ambulatory Visit: Payer: Self-pay

## 2023-09-22 ENCOUNTER — Other Ambulatory Visit (HOSPITAL_COMMUNITY): Payer: Self-pay

## 2023-09-22 DIAGNOSIS — C9110 Chronic lymphocytic leukemia of B-cell type not having achieved remission: Secondary | ICD-10-CM

## 2023-09-22 MED ORDER — ALLOPURINOL 100 MG PO TABS
100.0000 mg | ORAL_TABLET | Freq: Two times a day (BID) | ORAL | 0 refills | Status: DC
  Filled 2023-09-22 – 2023-09-25 (×2): qty 60, 30d supply, fill #0

## 2023-09-22 NOTE — Progress Notes (Signed)
Specialty Pharmacy Refill Coordination Note  Tony Banks is a 83 y.o. male contacted today regarding refills of specialty medication(s) Acalabrutinib Maleate (Calquence)   Patient requested Delivery   Delivery date: 09/26/23   Verified address: [address]   [place] [address]   Medication will be filled on 09/25/23.

## 2023-09-25 ENCOUNTER — Other Ambulatory Visit (HOSPITAL_COMMUNITY): Payer: Self-pay

## 2023-09-25 ENCOUNTER — Other Ambulatory Visit: Payer: Self-pay

## 2023-09-25 ENCOUNTER — Telehealth: Payer: Self-pay | Admitting: Pharmacy Technician

## 2023-09-25 NOTE — Telephone Encounter (Signed)
Oral Oncology Patient Advocate Encounter   Was successful in securing patient an $2,000 grant from [employer] [facility]) to provide copayment coverage for Calquence.  This will keep the out of pocket expense at $0.     I have spoken with the patient.    The billing information is as follows and has been shared with [facility].   Member ID: [id number] Group ID: [id number] RxBin: [id number] Dates of Eligibility: 09/25/23 through 09/22/24  Fund:  CLL  Jinger Neighbors, CPhT-Adv Oncology Pharmacy Patient Advocate [facility] Direct Number: [phone]  Fax: [phone]

## 2023-10-11 DIAGNOSIS — E782 Mixed hyperlipidemia: Secondary | ICD-10-CM | POA: Diagnosis not present

## 2023-10-11 DIAGNOSIS — I10 Essential (primary) hypertension: Secondary | ICD-10-CM | POA: Diagnosis not present

## 2023-10-11 DIAGNOSIS — Z125 Encounter for screening for malignant neoplasm of prostate: Secondary | ICD-10-CM | POA: Diagnosis not present

## 2023-10-11 DIAGNOSIS — Z0001 Encounter for general adult medical examination with abnormal findings: Secondary | ICD-10-CM | POA: Diagnosis not present

## 2023-10-11 DIAGNOSIS — E1152 Type 2 diabetes mellitus with diabetic peripheral angiopathy with gangrene: Secondary | ICD-10-CM | POA: Diagnosis not present

## 2023-10-13 ENCOUNTER — Other Ambulatory Visit (HOSPITAL_COMMUNITY): Payer: Self-pay

## 2023-10-16 ENCOUNTER — Other Ambulatory Visit: Payer: Self-pay

## 2023-10-16 DIAGNOSIS — C9110 Chronic lymphocytic leukemia of B-cell type not having achieved remission: Secondary | ICD-10-CM

## 2023-10-16 DIAGNOSIS — D649 Anemia, unspecified: Secondary | ICD-10-CM

## 2023-10-17 ENCOUNTER — Other Ambulatory Visit: Payer: Medicare HMO

## 2023-10-17 ENCOUNTER — Inpatient Hospital Stay (HOSPITAL_BASED_OUTPATIENT_CLINIC_OR_DEPARTMENT_OTHER): Payer: Medicare HMO | Admitting: Hematology

## 2023-10-17 ENCOUNTER — Inpatient Hospital Stay: Payer: Medicare HMO | Attending: Hematology

## 2023-10-17 ENCOUNTER — Ambulatory Visit: Payer: Medicare HMO | Admitting: Hematology

## 2023-10-17 VITALS — BP 113/54 | HR 75 | Temp 97.7°F | Resp 16 | Wt 141.7 lb

## 2023-10-17 DIAGNOSIS — C9110 Chronic lymphocytic leukemia of B-cell type not having achieved remission: Secondary | ICD-10-CM

## 2023-10-17 DIAGNOSIS — Z809 Family history of malignant neoplasm, unspecified: Secondary | ICD-10-CM | POA: Insufficient documentation

## 2023-10-17 DIAGNOSIS — R61 Generalized hyperhidrosis: Secondary | ICD-10-CM | POA: Insufficient documentation

## 2023-10-17 DIAGNOSIS — D649 Anemia, unspecified: Secondary | ICD-10-CM

## 2023-10-17 DIAGNOSIS — Z7984 Long term (current) use of oral hypoglycemic drugs: Secondary | ICD-10-CM | POA: Insufficient documentation

## 2023-10-17 DIAGNOSIS — R252 Cramp and spasm: Secondary | ICD-10-CM | POA: Insufficient documentation

## 2023-10-17 DIAGNOSIS — N400 Enlarged prostate without lower urinary tract symptoms: Secondary | ICD-10-CM | POA: Diagnosis not present

## 2023-10-17 DIAGNOSIS — I10 Essential (primary) hypertension: Secondary | ICD-10-CM | POA: Insufficient documentation

## 2023-10-17 DIAGNOSIS — R5383 Other fatigue: Secondary | ICD-10-CM | POA: Diagnosis not present

## 2023-10-17 DIAGNOSIS — I700 Atherosclerosis of aorta: Secondary | ICD-10-CM | POA: Insufficient documentation

## 2023-10-17 DIAGNOSIS — E119 Type 2 diabetes mellitus without complications: Secondary | ICD-10-CM | POA: Insufficient documentation

## 2023-10-17 DIAGNOSIS — K5900 Constipation, unspecified: Secondary | ICD-10-CM | POA: Diagnosis not present

## 2023-10-17 DIAGNOSIS — Z79899 Other long term (current) drug therapy: Secondary | ICD-10-CM | POA: Insufficient documentation

## 2023-10-17 DIAGNOSIS — Z87891 Personal history of nicotine dependence: Secondary | ICD-10-CM | POA: Diagnosis not present

## 2023-10-17 DIAGNOSIS — E785 Hyperlipidemia, unspecified: Secondary | ICD-10-CM | POA: Insufficient documentation

## 2023-10-17 LAB — CBC WITH DIFFERENTIAL (CANCER CENTER ONLY)
Abs Immature Granulocytes: 0.23 10*3/uL — ABNORMAL HIGH (ref 0.00–0.07)
Basophils Absolute: 0.1 10*3/uL (ref 0.0–0.1)
Basophils Relative: 0 %
Eosinophils Absolute: 0.2 10*3/uL (ref 0.0–0.5)
Eosinophils Relative: 0 %
HCT: 35.1 % — ABNORMAL LOW (ref 39.0–52.0)
Hemoglobin: 10.5 g/dL — ABNORMAL LOW (ref 13.0–17.0)
Immature Granulocytes: 0 %
Lymphocytes Relative: 93 %
Lymphs Abs: 103.7 10*3/uL — ABNORMAL HIGH (ref 0.7–4.0)
MCH: 29.7 pg (ref 26.0–34.0)
MCHC: 29.9 g/dL — ABNORMAL LOW (ref 30.0–36.0)
MCV: 99.4 fL (ref 80.0–100.0)
Monocytes Absolute: 1.7 10*3/uL — ABNORMAL HIGH (ref 0.1–1.0)
Monocytes Relative: 2 %
Neutro Abs: 5.9 10*3/uL (ref 1.7–7.7)
Neutrophils Relative %: 5 %
Platelet Count: 144 10*3/uL — ABNORMAL LOW (ref 150–400)
RBC: 3.53 MIL/uL — ABNORMAL LOW (ref 4.22–5.81)
RDW: 16.2 % — ABNORMAL HIGH (ref 11.5–15.5)
Smear Review: NORMAL
WBC Count: 111.8 10*3/uL (ref 4.0–10.5)
nRBC: 0 % (ref 0.0–0.2)

## 2023-10-17 LAB — CMP (CANCER CENTER ONLY)
ALT: 19 U/L (ref 0–44)
AST: 20 U/L (ref 15–41)
Albumin: 4.6 g/dL (ref 3.5–5.0)
Alkaline Phosphatase: 86 U/L (ref 38–126)
Anion gap: 6 (ref 5–15)
BUN: 18 mg/dL (ref 8–23)
CO2: 27 mmol/L (ref 22–32)
Calcium: 9.6 mg/dL (ref 8.9–10.3)
Chloride: 108 mmol/L (ref 98–111)
Creatinine: 1.1 mg/dL (ref 0.61–1.24)
GFR, Estimated: >60 mL/min (ref >60)
Glucose, Bld: 101 mg/dL — ABNORMAL HIGH (ref 70–99)
Potassium: 4.6 mmol/L (ref 3.5–5.1)
Sodium: 141 mmol/L (ref 135–145)
Total Bilirubin: 0.8 mg/dL (ref 0.0–1.2)
Total Protein: 7.1 g/dL (ref 6.5–8.1)

## 2023-10-17 LAB — SAMPLE TO BLOOD BANK

## 2023-10-17 NOTE — Progress Notes (Signed)
HEMATOLOGY/ONCOLOGY CLINIC NOTE  Date of Service: 10/17/23   Patient Care Team: Jackie Plum, MD as PCP - General (Internal Medicine) Gabriel Carina, [facility] (Inactive) as Pharmacist (Pharmacist) [name], Lise Auer, RN as Oncology Nurse Navigator Lonie Peak, MD as Consulting Physician (Radiation Oncology) Johney Maine, MD as Consulting Physician (Hematology) Glennis Brink, MD as Referring Physician (Dermatology)  CHIEF COMPLAINTS/PURPOSE OF CONSULTATION:  Follow-up for continued evaluation and management of CLL  HISTORY OF PRESENTING ILLNESS:  Please see previous note for details of initial presentation  INTERVAL HISTORY:   Tony Banks is a 83 y.o. 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  Patient was last seen by me on 08/11/2023 and he complained of chronic mild fatigue, persistent easy bleeding/bruising after engaging in yard work, and occasional bilateral leg cramps.   Patient notes he has been doing well overall since our last visit. He has been tolerating his current dose of Calquence 100 mg twice daily well without ant new or severe toxicities.   He denies any new infection issues, fever, chills, unexpected weight loss, abnormal bleeding/bruising, back pain, chest pain, abdominal pain, or leg swelling. He does complain of mild occasional night sweats.   Patient notes that his energy has significantly improved after starting  Calquence.   He is complaint with all of his medications. Patient notes his Vasotec dosage has been reduced to 2.5 mg from 5 mg.   MEDICAL HISTORY:  Past Medical History:  Diagnosis Date   Aortic atherosclerosis (HCC)    CLL (chronic lymphocytic leukemia) (HCC)    Constipation    severe   Diabetes mellitus without complication (HCC)    Diverticulosis    HLD (hyperlipidemia)    Hypertension    Nephrolithiasis     SURGICAL HISTORY: Past Surgical History:  Procedure Laterality Date   FINGER  FRACTURE SURGERY Right    middle finger   SHOULDER ARTHROSCOPY Bilateral    VASECTOMY      SOCIAL HISTORY: Social History   Socioeconomic History   Marital status: Married    Spouse name: [name]   Number of children: 3   Years of education: 14   Highest education level: Some college, no degree  Occupational History   Occupation: retired  Tobacco Use   Smoking status: Former    Current packs/day: 0.00    Types: Cigarettes    Quit date: 07/31/1977    Years since quitting: 46.2   Smokeless tobacco: Never   Tobacco comments:    quit 30 yrs ago  Vaping Use   Vaping status: Never Used  Substance and Sexual Activity   Alcohol use: No   Drug use: No   Sexual activity: Not Currently    Birth control/protection: Surgical  Other Topics Concern   Not on file  Social History Narrative   Drinks 1-2 cups of coffee. Enjoys reading, watching tv and likes to ride his stationary bike.    Social Drivers of Corporate investment banker Strain: Low Risk  (10/12/2020)   Overall Financial Resource Strain (CARDIA)    Difficulty of Paying Living Expenses: Not hard at all  Food Insecurity: No Food Insecurity (10/12/2020)   Hunger Vital Sign    Worried About Running Out of Food in the Last Year: Never true    Ran Out of Food in the Last Year: Never true  Transportation Needs: No Transportation Needs (10/12/2020)   PRAPARE - Administrator, Civil Service (Medical): No  Lack of Transportation (Non-Medical): No  Physical Activity: Sufficiently Active (10/12/2020)   Exercise Vital Sign    Days of Exercise per Week: 7 days    Minutes of Exercise per Session: 80 min  Stress: No Stress Concern Present (10/12/2020)   [employer] of Occupational Health - Occupational Stress Questionnaire    Feeling of Stress : Not at all  Social Connections: Moderately Isolated (10/12/2020)   Social Connection and Isolation Panel [NHANES]    Frequency of Communication with Friends and Family: Once a week     Frequency of Social Gatherings with Friends and Family: Once a week    Attends Religious Services: More than 4 times per year    Active Member of [employer] or Organizations: No    Attends Banker Meetings: Never    Marital Status: Married  Catering manager Violence: Not At Risk (10/12/2020)   Humiliation, Afraid, Rape, and Kick questionnaire    Fear of Current or Ex-Partner: No    Emotionally Abused: No    Physically Abused: No    Sexually Abused: No    FAMILY HISTORY: Family History  Problem Relation Age of Onset   Heart disease Mother    Stroke Mother    Cancer Father    Stroke Maternal Grandfather    Colon cancer Neg Hx    Pancreatic cancer Neg Hx    Rectal cancer Neg Hx    Stomach cancer Neg Hx     ALLERGIES:  has no known allergies.  MEDICATIONS:  Current Outpatient Medications  Medication Sig Dispense Refill   acalabrutinib maleate (CALQUENCE) 100 MG tablet Take 1 tablet (100 mg total) by mouth 2 (two) times daily. 60 tablet 1   ACCU-CHEK AVIVA PLUS test strip USE DAILY OR UP TO 4 TIMES PER DAY AS DIRECTED). USE AS INSTRUCTED 200 strip PRN   ACCU-CHEK SOFTCLIX LANCETS lancets Use as instructed 100 each 12   allopurinol (ZYLOPRIM) 100 MG tablet Take 1 tablet (100 mg total) by mouth 2 (two) times daily. 60 tablet 0   AMBULATORY NON FORMULARY MEDICATION Accu-chek aviva plus test strips and accu-chek softclix lancets, use to check blood sugar up to four times a day. Dx Type 2 diabetes. 100 Units 3   atorvastatin (LIPITOR) 10 MG tablet Take 1 tablet (10 mg total) by mouth daily. 90 tablet 1   enalapril (VASOTEC) 5 MG tablet Take 1 tablet (5 mg total) by mouth daily. (Patient taking differently: Take 5 mg by mouth daily. 2.5 mg daily) 90 tablet 1   Ferrous Sulfate (SLOW FE) 142 (45 Fe) MG TBCR Take 1 tablet by mouth daily. 30 tablet    magnesium 30 MG tablet Take 30 mg by mouth 2 (two) times daily. (Patient not taking: Reported on 08/11/2023)     metFORMIN  (GLUCOPHAGE) 500 MG tablet TAKE 2 TABLETS BY MOUTH EVERY DAY WITH BREAKFAST (Patient taking differently: Take 1,000 mg by mouth daily with breakfast.) 180 tablet 3   ondansetron (ZOFRAN) 8 MG tablet Take 1 tablet (8 mg total) by mouth every 8 (eight) hours as needed for nausea or vomiting. 20 tablet 0   polyethylene glycol (MIRALAX) packet Take 17 g by mouth daily. 28 each 0   No current facility-administered medications for this visit.    REVIEW OF SYSTEMS:    10 Point review of Systems was done is negative except as noted above.   PHYSICAL EXAMINATION: .BP (!) 113/54 (BP Location: Left Arm, Patient Position: Sitting)   Pulse 75  Temp 97.7 F (36.5 C) (Temporal)   Resp 16   Wt 141 lb 11.2 oz (64.3 kg)   SpO2 100%   BMI 21.55 kg/m  GENERAL:alert, in no acute distress and comfortable SKIN: no acute rashes, no significant lesions EYES: conjunctiva are pink and non-injected, sclera anicteric OROPHARYNX: MMM, no exudates, no oropharyngeal erythema or ulceration NECK: supple, no JVD LYMPH:  no palpable lymphadenopathy in the cervical, axillary or inguinal regions LUNGS: clear to auscultation b/l with normal respiratory effort HEART: regular rate & rhythm ABDOMEN:  normoactive bowel sounds , non tender, not distended. Extremity: no pedal edema PSYCH: alert & oriented x 3 with fluent speech NEURO: no focal motor/sensory deficits   LABORATORY DATA:  I have reviewed the data as listed  .    Latest Ref Rng & Units 10/17/2023   10:54 AM 08/11/2023    2:36 PM 07/13/2023    2:25 PM  CBC  WBC 4.0 - 10.5 K/uL 111.8  230.8  206.7   Hemoglobin 13.0 - 17.0 g/dL 16.1  09.6  7.6   Hematocrit 39.0 - 52.0 % 35.1  36.4  28.3   Platelets 150 - 400 K/uL 144  185  106    . CBC    Component Value Date/Time   WBC 111.8 (HH) 10/17/2023 1054   WBC 139.2 (HH) 11/15/2021 1345   RBC 3.53 (L) 10/17/2023 1054   HGB 10.5 (L) 10/17/2023 1054   HGB 12.8 (L) 05/29/2017 1141   HCT 35.1 (L) 10/17/2023  1054   HCT 38.7 05/29/2017 1141   PLT 144 (L) 10/17/2023 1054   PLT 151 05/29/2017 1141   MCV 99.4 10/17/2023 1054   MCV 91.3 05/29/2017 1141   MCH 29.7 10/17/2023 1054   MCHC 29.9 (L) 10/17/2023 1054   RDW 16.2 (H) 10/17/2023 1054   RDW 13.8 05/29/2017 1141   LYMPHSABS 103.7 (H) 10/17/2023 1054   LYMPHSABS 8.8 (H) 05/29/2017 1141   MONOABS 1.7 (H) 10/17/2023 1054   MONOABS 0.7 05/29/2017 1141   EOSABS 0.2 10/17/2023 1054   EOSABS 0.1 05/29/2017 1141   BASOSABS 0.1 10/17/2023 1054   BASOSABS 0.0 05/29/2017 1141    .    Latest Ref Rng & Units 10/17/2023   10:54 AM 08/11/2023    2:36 PM 07/13/2023    2:25 PM  CMP  Glucose 70 - 99 mg/dL 045  409  811   BUN 8 - 23 mg/dL 18  30  27    Creatinine 0.61 - 1.24 mg/dL 9.14  7.82  9.56   Sodium 135 - 145 mmol/L 141  142  141   Potassium 3.5 - 5.1 mmol/L 4.6  4.6  4.2   Chloride 98 - 111 mmol/L 108  108  111   CO2 22 - 32 mmol/L 27  27  23    Calcium 8.9 - 10.3 mg/dL 9.6  9.8  9.1   Total Protein 6.5 - 8.1 g/dL 7.1  7.0  6.3   Total Bilirubin 0.0 - 1.2 mg/dL 0.8  0.5  0.5   Alkaline Phos 38 - 126 U/L 86  97  81   AST 15 - 41 U/L 20  16  16    ALT 0 - 44 U/L 19  14  15    06/08/17 FISH CLL Prognostic Panel:    06/08/17 Cytogenetics:   05/29/17 Peripheral blood flow cytometry:    RADIOGRAPHIC STUDIES: I have personally reviewed the radiological images as listed and agreed with the findings in the report. No results  found.  ASSESSMENT & PLAN:   83 y.o. male with  1. Chronic Lymphocytic Leukemia Rai 1  06/08/17 CLL FISH Prognostic panel negative for del17p, del11q, del13q, indicating intermediate risk 11/15/2019 CT C/A/P ([id number]) revealed gradual lymph node increase compared to 2018 CT. No major lymphoma progression. Mildly enlarged prostate.    2.  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 -Continue to monitor and manage per dermatology  PLAN:  -Discussed lab results from today,  10/17/2023, in detail with the patient. CBC shows elevated but improved WBC from 230 K to 111.8 K, low Hgb of 10.5 g/dL with Hct of 09.8%, and low platelets of 144 K. CMP pending.  -patient has been tolerating his current Calquence 100 mg twice daily with no new or severe toxicities.  -Continue Calquence 100 mg twice daily.  -advised patient to take reasonable precautions including wearing face masks in crowded areas -recommend patient to stay UTD with his age-appropriate vaccines -Answered all of patient's questions.   FOLLOW-UP: RTC with Dr Candise Che with labs in 2 months  The total time spent in the appointment was 23 minutes* .  All of the patient's questions were answered with apparent satisfaction. The patient knows to call the clinic with any problems, questions or concerns.   Wyvonnia Lora MD MS AAHIVMS [facility] Hematology/Oncology Physician [facility]  .*Total Encounter Time as defined by the Centers for Medicare and Medicaid Services includes, in addition to the face-to-face time of a patient visit (documented in the note above) non-face-to-face time: obtaining and reviewing outside history, ordering and reviewing medications, tests or procedures, care coordination (communications with other health care professionals or caregivers) and documentation in the medical record.   I,Param Shah,acting as a Neurosurgeon for Wyvonnia Lora, MD.,have documented all relevant documentation on the behalf of Wyvonnia Lora, MD,as directed by  Wyvonnia Lora, MD while in the presence of Wyvonnia Lora, MD.  .I have reviewed the above documentation for accuracy and completeness, and I agree with the above. Johney Maine MD

## 2023-10-18 ENCOUNTER — Other Ambulatory Visit: Payer: Self-pay | Admitting: Hematology

## 2023-10-18 ENCOUNTER — Other Ambulatory Visit: Payer: Self-pay

## 2023-10-18 ENCOUNTER — Other Ambulatory Visit (HOSPITAL_COMMUNITY): Payer: Self-pay

## 2023-10-18 DIAGNOSIS — C9110 Chronic lymphocytic leukemia of B-cell type not having achieved remission: Secondary | ICD-10-CM

## 2023-10-18 MED ORDER — ALLOPURINOL 100 MG PO TABS
100.0000 mg | ORAL_TABLET | Freq: Two times a day (BID) | ORAL | 0 refills | Status: DC
  Filled 2023-10-18: qty 60, 30d supply, fill #0

## 2023-10-18 MED ORDER — CALQUENCE 100 MG PO TABS
100.0000 mg | ORAL_TABLET | Freq: Two times a day (BID) | ORAL | 1 refills | Status: DC
  Filled 2023-10-18: qty 60, 30d supply, fill #0
  Filled 2023-11-28: qty 60, 30d supply, fill #1

## 2023-10-18 NOTE — Progress Notes (Signed)
Specialty Pharmacy Refill Coordination Note  [name] is a 83 y.o. male contacted today regarding refills of specialty medication(s) Acalabrutinib Maleate (Calquence)   Patient requested (Patient-Rptd) Delivery   Delivery date: (Patient-Rptd) 11/13/23   Verified address: (Patient-Rptd) [address]  [place]   Medication will be filled on 11/10/23.   Patient called in and requested Allopurinol be added to delivery as well.   This fill date is pending response to refill request from provider. Patient is aware and if they have not received fill by intended date they must follow up with pharmacy.

## 2023-11-06 ENCOUNTER — Other Ambulatory Visit (HOSPITAL_COMMUNITY): Payer: Self-pay

## 2023-11-06 ENCOUNTER — Other Ambulatory Visit: Payer: Self-pay

## 2023-11-06 NOTE — Progress Notes (Signed)
 Pt called to let us know he had one day of medication remaining, re-timed fill to Ship 3/03, patient will receive tomorrow and will not miss any doses, address verified.

## 2023-11-28 ENCOUNTER — Other Ambulatory Visit: Payer: Self-pay

## 2023-11-28 ENCOUNTER — Other Ambulatory Visit: Payer: Self-pay | Admitting: Hematology

## 2023-11-28 DIAGNOSIS — C9110 Chronic lymphocytic leukemia of B-cell type not having achieved remission: Secondary | ICD-10-CM

## 2023-11-28 NOTE — Progress Notes (Signed)
 Specialty Pharmacy Refill Coordination Note  Tony Banks is a 83 y.o. male contacted today regarding refills of specialty medication(s) Acalabrutinib Maleate (Calquence)   Patient requested Delivery   Delivery date: 12/05/23   Verified address: [address] [place]   Medication will be filled on 12/04/23.   Ship with allopurinol.

## 2023-11-28 NOTE — Progress Notes (Signed)
 Specialty Pharmacy Ongoing Clinical Assessment Note  Tony Banks is a 83 y.o. male who is being followed by the specialty pharmacy service for RxSp Oncology   Patient's specialty medication(s) reviewed today: Acalabrutinib Maleate (Calquence)   Missed doses in the last 4 weeks: 0   Patient/Caregiver did not have any additional questions or concerns.   Therapeutic benefit summary: Patient is achieving benefit   Adverse events/side effects summary: No adverse events/side effects   Patient's therapy is appropriate to: Continue    Goals Addressed             This Visit's Progress    Slow Disease Progression       Patient is on track. Patient will maintain adherence. Provider notes lab improvement in WBC from visit 10/17/23.            Follow up:  3 months  Otto Herb Specialty Pharmacist

## 2023-11-29 ENCOUNTER — Other Ambulatory Visit (HOSPITAL_COMMUNITY): Payer: Self-pay

## 2023-12-04 ENCOUNTER — Other Ambulatory Visit: Payer: Self-pay

## 2023-12-11 ENCOUNTER — Other Ambulatory Visit: Payer: Self-pay

## 2023-12-11 DIAGNOSIS — C9110 Chronic lymphocytic leukemia of B-cell type not having achieved remission: Secondary | ICD-10-CM

## 2023-12-12 ENCOUNTER — Inpatient Hospital Stay: Payer: Medicare HMO | Admitting: Hematology

## 2023-12-12 ENCOUNTER — Inpatient Hospital Stay: Payer: Medicare HMO | Attending: Hematology

## 2023-12-12 ENCOUNTER — Telehealth: Payer: Self-pay

## 2023-12-12 VITALS — BP 121/65 | HR 75 | Temp 97.7°F | Resp 17 | Wt 146.2 lb

## 2023-12-12 DIAGNOSIS — Z809 Family history of malignant neoplasm, unspecified: Secondary | ICD-10-CM | POA: Insufficient documentation

## 2023-12-12 DIAGNOSIS — E785 Hyperlipidemia, unspecified: Secondary | ICD-10-CM | POA: Insufficient documentation

## 2023-12-12 DIAGNOSIS — N400 Enlarged prostate without lower urinary tract symptoms: Secondary | ICD-10-CM | POA: Insufficient documentation

## 2023-12-12 DIAGNOSIS — K5900 Constipation, unspecified: Secondary | ICD-10-CM | POA: Diagnosis not present

## 2023-12-12 DIAGNOSIS — C9110 Chronic lymphocytic leukemia of B-cell type not having achieved remission: Secondary | ICD-10-CM

## 2023-12-12 DIAGNOSIS — E119 Type 2 diabetes mellitus without complications: Secondary | ICD-10-CM | POA: Insufficient documentation

## 2023-12-12 DIAGNOSIS — I700 Atherosclerosis of aorta: Secondary | ICD-10-CM | POA: Insufficient documentation

## 2023-12-12 DIAGNOSIS — Z87891 Personal history of nicotine dependence: Secondary | ICD-10-CM | POA: Diagnosis not present

## 2023-12-12 DIAGNOSIS — Z79899 Other long term (current) drug therapy: Secondary | ICD-10-CM | POA: Insufficient documentation

## 2023-12-12 DIAGNOSIS — I10 Essential (primary) hypertension: Secondary | ICD-10-CM | POA: Insufficient documentation

## 2023-12-12 DIAGNOSIS — Z7984 Long term (current) use of oral hypoglycemic drugs: Secondary | ICD-10-CM | POA: Diagnosis not present

## 2023-12-12 LAB — CBC WITH DIFFERENTIAL (CANCER CENTER ONLY)
Abs Immature Granulocytes: 0.11 10*3/uL — ABNORMAL HIGH (ref 0.00–0.07)
Basophils Absolute: 0 10*3/uL (ref 0.0–0.1)
Basophils Relative: 0 %
Eosinophils Absolute: 0.1 10*3/uL (ref 0.0–0.5)
Eosinophils Relative: 0 %
HCT: 36.8 % — ABNORMAL LOW (ref 39.0–52.0)
Hemoglobin: 11.5 g/dL — ABNORMAL LOW (ref 13.0–17.0)
Immature Granulocytes: 0 %
Lymphocytes Relative: 89 %
Lymphs Abs: 57.7 10*3/uL — ABNORMAL HIGH (ref 0.7–4.0)
MCH: 29.6 pg (ref 26.0–34.0)
MCHC: 31.3 g/dL (ref 30.0–36.0)
MCV: 94.8 fL (ref 80.0–100.0)
Monocytes Absolute: 1.6 10*3/uL — ABNORMAL HIGH (ref 0.1–1.0)
Monocytes Relative: 3 %
Neutro Abs: 4.9 10*3/uL (ref 1.7–7.7)
Neutrophils Relative %: 8 %
Platelet Count: 138 10*3/uL — ABNORMAL LOW (ref 150–400)
RBC: 3.88 MIL/uL — ABNORMAL LOW (ref 4.22–5.81)
RDW: 15.6 % — ABNORMAL HIGH (ref 11.5–15.5)
Smear Review: NORMAL
WBC Count: 64.5 10*3/uL (ref 4.0–10.5)
nRBC: 0 % (ref 0.0–0.2)

## 2023-12-12 LAB — CMP (CANCER CENTER ONLY)
ALT: 19 U/L (ref 0–44)
AST: 18 U/L (ref 15–41)
Albumin: 4.8 g/dL (ref 3.5–5.0)
Alkaline Phosphatase: 89 U/L (ref 38–126)
Anion gap: 7 (ref 5–15)
BUN: 28 mg/dL — ABNORMAL HIGH (ref 8–23)
CO2: 26 mmol/L (ref 22–32)
Calcium: 9.6 mg/dL (ref 8.9–10.3)
Chloride: 108 mmol/L (ref 98–111)
Creatinine: 1.15 mg/dL (ref 0.61–1.24)
GFR, Estimated: >60 mL/min (ref >60)
Glucose, Bld: 135 mg/dL — ABNORMAL HIGH (ref 70–99)
Potassium: 4.6 mmol/L (ref 3.5–5.1)
Sodium: 141 mmol/L (ref 135–145)
Total Bilirubin: 0.7 mg/dL (ref 0.0–1.2)
Total Protein: 7.4 g/dL (ref 6.5–8.1)

## 2023-12-12 LAB — SAMPLE TO BLOOD BANK

## 2023-12-12 NOTE — Progress Notes (Signed)
 HEMATOLOGY/ONCOLOGY CLINIC NOTE  Date of Service: 12/12/23   Patient Care Team: Tretha Fu, MD as PCP - General (Internal Medicine) Noelia Batman, [facility] (Inactive) as Pharmacist (Pharmacist) [name], Nancyann Aye, RN as Oncology Nurse Navigator Colie Dawes, MD as Consulting Physician (Radiation Oncology) Frankie Israel, MD as Consulting Physician (Hematology) Eva Hikes, MD as Referring Physician (Dermatology)  CHIEF COMPLAINTS/PURPOSE OF CONSULTATION:  Follow-up for continued evaluation and management of CLL  HISTORY OF PRESENTING ILLNESS:  Please see previous note for details of initial presentation  INTERVAL HISTORY:   Tony Banks is a 83 y.o. 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  Patient was last seen by me on 10/17/2023 and he complained of mild occasional night sweats, but was doing well overall.   Patient notes he has been doing well overall since our last visit. He denies any new infection issues, fever, chills, night sweats, SOB, skin rashes, abnormal bleeding, unexpected weight loss, back pain, chest pain, abdominal pain, abnormal bowel movement, or leg swelling.   Patient has been tolerating his current Calquence dosage well without any new or severe toxicities. He notes that his energy has significantly improved since he started Calquence.   He is complaint with all of his medications.   MEDICAL HISTORY:  Past Medical History:  Diagnosis Date   Aortic atherosclerosis (HCC)    CLL (chronic lymphocytic leukemia) (HCC)    Constipation    severe   Diabetes mellitus without complication (HCC)    Diverticulosis    HLD (hyperlipidemia)    Hypertension    Nephrolithiasis     SURGICAL HISTORY: Past Surgical History:  Procedure Laterality Date   FINGER FRACTURE SURGERY Right    middle finger   SHOULDER ARTHROSCOPY Bilateral    VASECTOMY      SOCIAL HISTORY: Social History   Socioeconomic History   Marital  status: Married    Spouse name: [name]   Number of children: 3   Years of education: 14   Highest education level: Some college, no degree  Occupational History   Occupation: retired  Tobacco Use   Smoking status: Former    Current packs/day: 0.00    Types: Cigarettes    Quit date: 07/31/1977    Years since quitting: 46.3   Smokeless tobacco: Never   Tobacco comments:    quit 30 yrs ago  Vaping Use   Vaping status: Never Used  Substance and Sexual Activity   Alcohol use: No   Drug use: No   Sexual activity: Not Currently    Birth control/protection: Surgical  Other Topics Concern   Not on file  Social History Narrative   Drinks 1-2 cups of coffee. Enjoys reading, watching tv and likes to ride his stationary bike.    Social Drivers of Corporate investment banker Strain: Low Risk  (10/12/2020)   Overall Financial Resource Strain (CARDIA)    Difficulty of Paying Living Expenses: Not hard at all  Food Insecurity: No Food Insecurity (10/12/2020)   Hunger Vital Sign    Worried About Running Out of Food in the Last Year: Never true    Ran Out of Food in the Last Year: Never true  Transportation Needs: No Transportation Needs (10/12/2020)   PRAPARE - Administrator, Civil Service (Medical): No    Lack of Transportation (Non-Medical): No  Physical Activity: Sufficiently Active (10/12/2020)   Exercise Vital Sign    Days of Exercise per Week: 7  days    Minutes of Exercise per Session: 80 min  Stress: No Stress Concern Present (10/12/2020)   [employer] of Occupational Health - Occupational Stress Questionnaire    Feeling of Stress : Not at all  Social Connections: Moderately Isolated (10/12/2020)   Social Connection and Isolation Panel [NHANES]    Frequency of Communication with Friends and Family: Once a week    Frequency of Social Gatherings with Friends and Family: Once a week    Attends Religious Services: More than 4 times per year    Active Member of [employer] or  Organizations: No    Attends Banker Meetings: Never    Marital Status: Married  Catering manager Violence: Not At Risk (10/12/2020)   Humiliation, Afraid, Rape, and Kick questionnaire    Fear of Current or Ex-Partner: No    Emotionally Abused: No    Physically Abused: No    Sexually Abused: No    FAMILY HISTORY: Family History  Problem Relation Age of Onset   Heart disease Mother    Stroke Mother    Cancer Father    Stroke Maternal Grandfather    Colon cancer Neg Hx    Pancreatic cancer Neg Hx    Rectal cancer Neg Hx    Stomach cancer Neg Hx     ALLERGIES:  has no known allergies.  MEDICATIONS:  Current Outpatient Medications  Medication Sig Dispense Refill   acalabrutinib maleate (CALQUENCE) 100 MG tablet Take 1 tablet (100 mg total) by mouth 2 (two) times daily. 60 tablet 1   ACCU-CHEK AVIVA PLUS test strip USE DAILY OR UP TO 4 TIMES PER DAY AS DIRECTED). USE AS INSTRUCTED 200 strip PRN   ACCU-CHEK SOFTCLIX LANCETS lancets Use as instructed 100 each 12   AMBULATORY NON FORMULARY MEDICATION Accu-chek aviva plus test strips and accu-chek softclix lancets, use to check blood sugar up to four times a day. Dx Type 2 diabetes. 100 Units 3   atorvastatin (LIPITOR) 10 MG tablet Take 1 tablet (10 mg total) by mouth daily. 90 tablet 1   enalapril (VASOTEC) 5 MG tablet Take 1 tablet (5 mg total) by mouth daily. (Patient taking differently: Take 5 mg by mouth daily. 2.5 mg daily) 90 tablet 1   Ferrous Sulfate (SLOW FE) 142 (45 Fe) MG TBCR Take 1 tablet by mouth daily. 30 tablet    magnesium 30 MG tablet Take 30 mg by mouth 2 (two) times daily.     metFORMIN (GLUCOPHAGE) 500 MG tablet TAKE 2 TABLETS BY MOUTH EVERY DAY WITH BREAKFAST (Patient taking differently: Take 1,000 mg by mouth daily with breakfast.) 180 tablet 3   polyethylene glycol (MIRALAX) packet Take 17 g by mouth daily. 28 each 0   allopurinol (ZYLOPRIM) 100 MG tablet Take 1 tablet (100 mg total) by mouth 2 (two)  times daily. (Patient not taking: Reported on 12/12/2023) 60 tablet 0   ondansetron (ZOFRAN) 8 MG tablet Take 1 tablet (8 mg total) by mouth every 8 (eight) hours as needed for nausea or vomiting. (Patient not taking: Reported on 12/12/2023) 20 tablet 0   No current facility-administered medications for this visit.    REVIEW OF SYSTEMS:    10 Point review of Systems was done is negative except as noted above.   PHYSICAL EXAMINATION: .BP 121/65 (BP Location: Left Arm, Patient Position: Sitting)   Pulse 75   Temp 97.7 F (36.5 C) (Temporal)   Resp 17   Wt 146 lb 3.2 oz (66.3 kg)  SpO2 100%   BMI 22.23 kg/m  GENERAL:alert, in no acute distress and comfortable SKIN: no acute rashes, no significant lesions EYES: conjunctiva are pink and non-injected, sclera anicteric OROPHARYNX: MMM, no exudates, no oropharyngeal erythema or ulceration NECK: supple, no JVD LYMPH:  no palpable lymphadenopathy in the cervical, axillary or inguinal regions LUNGS: clear to auscultation b/l with normal respiratory effort HEART: regular rate & rhythm ABDOMEN:  normoactive bowel sounds , non tender, not distended. Mild palpable splenomegaly Extremity: no pedal edema PSYCH: alert & oriented x 3 with fluent speech NEURO: no focal motor/sensory deficits   LABORATORY DATA:  I have reviewed the data as listed  .    Latest Ref Rng & Units 12/12/2023    8:18 AM 10/17/2023   10:54 AM 08/11/2023    2:36 PM  CBC  WBC 4.0 - 10.5 K/uL 64.5  111.8  230.8   Hemoglobin 13.0 - 17.0 g/dL 16.1  09.6  04.5   Hematocrit 39.0 - 52.0 % 36.8  35.1  36.4   Platelets 150 - 400 K/uL 138  144  185    . CBC    Component Value Date/Time   WBC 64.5 (HH) 12/12/2023 0818   WBC 139.2 (HH) 11/15/2021 1345   RBC 3.88 (L) 12/12/2023 0818   HGB 11.5 (L) 12/12/2023 0818   HGB 12.8 (L) 05/29/2017 1141   HCT 36.8 (L) 12/12/2023 0818   HCT 38.7 05/29/2017 1141   PLT 138 (L) 12/12/2023 0818   PLT 151 05/29/2017 1141   MCV 94.8  12/12/2023 0818   MCV 91.3 05/29/2017 1141   MCH 29.6 12/12/2023 0818   MCHC 31.3 12/12/2023 0818   RDW 15.6 (H) 12/12/2023 0818   RDW 13.8 05/29/2017 1141   LYMPHSABS PENDING 12/12/2023 0818   LYMPHSABS 8.8 (H) 05/29/2017 1141   MONOABS PENDING 12/12/2023 0818   MONOABS 0.7 05/29/2017 1141   EOSABS PENDING 12/12/2023 0818   EOSABS 0.1 05/29/2017 1141   BASOSABS PENDING 12/12/2023 0818   BASOSABS 0.0 05/29/2017 1141    .    Latest Ref Rng & Units 12/12/2023    8:18 AM 10/17/2023   10:54 AM 08/11/2023    2:36 PM  CMP  Glucose 70 - 99 mg/dL 409  811  914   BUN 8 - 23 mg/dL 28  18  30    Creatinine 0.61 - 1.24 mg/dL 7.82  9.56  2.13   Sodium 135 - 145 mmol/L 141  141  142   Potassium 3.5 - 5.1 mmol/L 4.6  4.6  4.6   Chloride 98 - 111 mmol/L 108  108  108   CO2 22 - 32 mmol/L 26  27  27    Calcium 8.9 - 10.3 mg/dL 9.6  9.6  9.8   Total Protein 6.5 - 8.1 g/dL 7.4  7.1  7.0   Total Bilirubin 0.0 - 1.2 mg/dL 0.7  0.8  0.5   Alkaline Phos 38 - 126 U/L 89  86  97   AST 15 - 41 U/L 18  20  16    ALT 0 - 44 U/L 19  19  14    06/08/17 FISH CLL Prognostic Panel:    06/08/17 Cytogenetics:   05/29/17 Peripheral blood flow cytometry:    RADIOGRAPHIC STUDIES: I have personally reviewed the radiological images as listed and agreed with the findings in the report. No results found.  ASSESSMENT & PLAN:   83 y.o. male with  1. Chronic Lymphocytic Leukemia Rai 1  06/08/17 CLL  FISH Prognostic panel negative for del17p, del11q, del13q, indicating intermediate risk 11/15/2019 CT C/A/P ([id number]) revealed gradual lymph node increase compared to 2018 CT. No major lymphoma progression. Mildly enlarged prostate.    2.  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 -Continue to monitor and manage per dermatology  PLAN:  -Discussed lab results from today, 12/12/2023, in detail with the patient. CBC shows elevated but improved WBC of 64.5 K, slightly low but  improved Hgb of 11.5 g/dL with Hct of 33.2%, and low platelets of 138 K. Cmp stable   -patient has been tolerating his current Calquence 100 mg twice daily with no new or severe toxicities.  -Continue Calquence 100 mg twice daily.  -advised patient to take reasonable precautions including wearing face masks in crowded areas -recommend patient to stay UTD with his age-appropriate vaccines -Answered all of patient's questions.   FOLLOW-UP: RTC with Dr Salomon Cree with labs in 10 weeks  The total time spent in the appointment was 20 minutes* .  All of the patient's questions were answered with apparent satisfaction. The patient knows to call the clinic with any problems, questions or concerns.   Jacquelyn Matt MD MS AAHIVMS [facility] Hematology/Oncology Physician [facility]  .*Total Encounter Time as defined by the Centers for Medicare and Medicaid Services includes, in addition to the face-to-face time of a patient visit (documented in the note above) non-face-to-face time: obtaining and reviewing outside history, ordering and reviewing medications, tests or procedures, care coordination (communications with other health care professionals or caregivers) and documentation in the medical record.   I,Param Shah,acting as a Neurosurgeon for Jacquelyn Matt, MD.,have documented all relevant documentation on the behalf of Jacquelyn Matt, MD,as directed by  Jacquelyn Matt, MD while in the presence of Jacquelyn Matt, MD.  .I have reviewed the above documentation for accuracy and completeness, and I agree with the above. .Hagen Bohorquez Kishore Reneshia Zuccaro MD

## 2023-12-12 NOTE — Telephone Encounter (Signed)
 CRITICAL VALUE STICKER  CRITICAL VALUE:   WBC 64.5  RECEIVER (on-site recipient of call): Daneil Dolin, LPN  DATE & TIME NOTIFIED: 12/12/2023  08:53  MESSENGER (representative from lab): Shanda Bumps  MD NOTIFIED: Estrellita Ludwig, MD  TIME OF NOTIFICATION: 08:53  RESPONSE:

## 2023-12-20 ENCOUNTER — Other Ambulatory Visit: Payer: Self-pay

## 2023-12-25 ENCOUNTER — Other Ambulatory Visit: Payer: Self-pay

## 2023-12-25 ENCOUNTER — Other Ambulatory Visit: Payer: Self-pay | Admitting: Hematology

## 2023-12-25 DIAGNOSIS — C9110 Chronic lymphocytic leukemia of B-cell type not having achieved remission: Secondary | ICD-10-CM

## 2023-12-25 MED ORDER — CALQUENCE 100 MG PO TABS
100.0000 mg | ORAL_TABLET | Freq: Two times a day (BID) | ORAL | 1 refills | Status: DC
  Filled 2023-12-25: qty 60, 30d supply, fill #0
  Filled 2024-01-22: qty 60, 30d supply, fill #1

## 2023-12-25 NOTE — Progress Notes (Signed)
 Specialty Pharmacy Refill Coordination Note  Tony Banks is a 83 y.o. male contacted today regarding refills of specialty medication(s) Acalabrutinib  Maleate (Calquence )   Patient requested (Patient-Rptd) Delivery   Delivery date: (Patient-Rptd) 01/04/24   Verified address: (Patient-Rptd) [address] [place]   Medication will be filled on 01/03/24.

## 2024-01-03 ENCOUNTER — Other Ambulatory Visit: Payer: Self-pay

## 2024-01-22 ENCOUNTER — Other Ambulatory Visit: Payer: Self-pay | Admitting: Pharmacy Technician

## 2024-01-22 ENCOUNTER — Other Ambulatory Visit: Payer: Self-pay

## 2024-01-22 NOTE — Progress Notes (Signed)
 Specialty Pharmacy Refill Coordination Note  ZACCHEAUS STORLIE is a 83 y.o. male contacted today regarding refills of specialty medication(s) Acalabrutinib  Maleate (Calquence )   Patient requested Delivery   Delivery date: 02/05/24 (No delivery on weekend)   Verified address: [address] [place] [address]   Medication will be filled on 02/02/24.   Confirm with patient of new delivery date 02/05/24.

## 2024-02-02 ENCOUNTER — Other Ambulatory Visit: Payer: Self-pay

## 2024-02-14 DIAGNOSIS — E782 Mixed hyperlipidemia: Secondary | ICD-10-CM | POA: Diagnosis not present

## 2024-02-14 DIAGNOSIS — E1152 Type 2 diabetes mellitus with diabetic peripheral angiopathy with gangrene: Secondary | ICD-10-CM | POA: Diagnosis not present

## 2024-02-14 DIAGNOSIS — I10 Essential (primary) hypertension: Secondary | ICD-10-CM | POA: Diagnosis not present

## 2024-02-14 DIAGNOSIS — Z0001 Encounter for general adult medical examination with abnormal findings: Secondary | ICD-10-CM | POA: Diagnosis not present

## 2024-02-19 ENCOUNTER — Other Ambulatory Visit: Payer: Self-pay

## 2024-02-19 NOTE — Progress Notes (Signed)
 Specialty Pharmacy Ongoing Clinical Assessment Note  Tony Banks is a 83 y.o. male who is being followed by the specialty pharmacy service for RxSp Oncology   Patient's specialty medication(s) reviewed today: Acalabrutinib  Maleate (Calquence )   Missed doses in the last 4 weeks: 0   Patient/Caregiver did not have any additional questions or concerns.   Therapeutic benefit summary: Patient is achieving benefit   Adverse events/side effects summary: Experienced adverse events/side effects (Patient reported an increase in fatigue and shortness of breath recently, as well as more bruising. Will discuss with Dr Salomon Cree at appt on 6/18)   Patient's therapy is appropriate to: Continue    Goals Addressed             This Visit's Progress    Slow Disease Progression   On track    Patient is on track. Patient will maintain adherence. Provider notes lab improvement in WBC and hemoglobin from visit 12/12/23.            Follow up: 3 months  [facility]

## 2024-02-20 ENCOUNTER — Other Ambulatory Visit: Payer: Self-pay

## 2024-02-20 DIAGNOSIS — D649 Anemia, unspecified: Secondary | ICD-10-CM

## 2024-02-20 DIAGNOSIS — C9110 Chronic lymphocytic leukemia of B-cell type not having achieved remission: Secondary | ICD-10-CM

## 2024-02-20 NOTE — Progress Notes (Incomplete)
 HEMATOLOGY/ONCOLOGY CLINIC NOTE  Date of Service: 02/21/2024   Patient Care Team: Tretha Fu, MD as PCP - General (Internal Medicine) Noelia Batman, [facility] (Inactive) as Pharmacist (Pharmacist) [name], Nancyann Aye, RN as Oncology Nurse Navigator Colie Dawes, MD as Consulting Physician (Radiation Oncology) Frankie Israel, MD as Consulting Physician (Hematology) Eva Hikes, MD as Referring Physician (Dermatology)  CHIEF COMPLAINTS/PURPOSE OF CONSULTATION:  Follow-up for continued evaluation and management of CLL  HISTORY OF PRESENTING ILLNESS:  Please see previous note for details of initial presentation  INTERVAL HISTORY:   Tony Banks is a 83 y.o. 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  Patient was last seen by me on 12/12/2023 and was doing well overall with no new medical complaints.   He complains of bruising easily.   Patient is not taking vitamin B complex.   His leg swelling has been generally stable and denies leg swelling at this time.   He reports that he has been generally eating well.   Patient complains of back pain, which he attributes to arthritis. He denies any neck pain.   He complains of knee cramping. He reports that he was told by his PCP that his weakness in the knees was due to circulation issues.   Patient complains of generalized joint soreness in all of his joints, including the shoulder and hips.   Patient reports that he is needing to stop and rest due to SOB after activity.   He denies any bleeding issues, fever, chills, or cough. His night sweats are unchanged.   He complains of persistent tremors in bilateral upper extremities, R>L since last week. His tremors limit his ability to write and pick up objects. He denies any known trigger for his tremors. Patient denies using his right arm more than usual. Patient has not discussed his tremors with his PCP yet.   He reports that he has been on  Metformin  for a while.   MEDICAL HISTORY:  Past Medical History:  Diagnosis Date   Aortic atherosclerosis (HCC)    CLL (chronic lymphocytic leukemia) (HCC)    Constipation    severe   Diabetes mellitus without complication (HCC)    Diverticulosis    HLD (hyperlipidemia)    Hypertension    Nephrolithiasis     SURGICAL HISTORY: Past Surgical History:  Procedure Laterality Date   FINGER FRACTURE SURGERY Right    middle finger   SHOULDER ARTHROSCOPY Bilateral    VASECTOMY      SOCIAL HISTORY: Social History   Socioeconomic History   Marital status: Married    Spouse name: [name]   Number of children: 3   Years of education: 14   Highest education level: Some college, no degree  Occupational History   Occupation: retired  Tobacco Use   Smoking status: Former    Current packs/day: 0.00    Types: Cigarettes    Quit date: 07/31/1977    Years since quitting: 46.5   Smokeless tobacco: Never   Tobacco comments:    quit 30 yrs ago  Vaping Use   Vaping status: Never Used  Substance and Sexual Activity   Alcohol use: No   Drug use: No   Sexual activity: Not Currently    Birth control/protection: Surgical  Other Topics Concern   Not on file  Social History Narrative   Drinks 1-2 cups of coffee. Enjoys reading, watching tv and likes to ride his stationary bike.    Social Drivers  of Health   Financial Resource Strain: Low Risk  (10/12/2020)   Overall Financial Resource Strain (CARDIA)    Difficulty of Paying Living Expenses: Not hard at all  Food Insecurity: No Food Insecurity (10/12/2020)   Hunger Vital Sign    Worried About Running Out of Food in the Last Year: Never true    Ran Out of Food in the Last Year: Never true  Transportation Needs: No Transportation Needs (10/12/2020)   PRAPARE - Administrator, Civil Service (Medical): No    Lack of Transportation (Non-Medical): No  Physical Activity: Sufficiently Active (10/12/2020)   Exercise Vital Sign    Days  of Exercise per Week: 7 days    Minutes of Exercise per Session: 80 min  Stress: No Stress Concern Present (10/12/2020)   [employer] of Occupational Health - Occupational Stress Questionnaire    Feeling of Stress : Not at all  Social Connections: Moderately Isolated (10/12/2020)   Social Connection and Isolation Panel    Frequency of Communication with Friends and Family: Once a week    Frequency of Social Gatherings with Friends and Family: Once a week    Attends Religious Services: More than 4 times per year    Active Member of [employer] or Organizations: No    Attends Banker Meetings: Never    Marital Status: Married  Catering manager Violence: Not At Risk (10/12/2020)   Humiliation, Afraid, Rape, and Kick questionnaire    Fear of Current or Ex-Partner: No    Emotionally Abused: No    Physically Abused: No    Sexually Abused: No    FAMILY HISTORY: Family History  Problem Relation Age of Onset   Heart disease Mother    Stroke Mother    Cancer Father    Stroke Maternal Grandfather    Colon cancer Neg Hx    Pancreatic cancer Neg Hx    Rectal cancer Neg Hx    Stomach cancer Neg Hx     ALLERGIES:  has no known allergies.  MEDICATIONS:  Current Outpatient Medications  Medication Sig Dispense Refill   acalabrutinib  maleate (CALQUENCE ) 100 MG tablet Take 1 tablet (100 mg total) by mouth 2 (two) times daily. 60 tablet 1   ACCU-CHEK AVIVA PLUS test strip USE DAILY OR UP TO 4 TIMES PER DAY AS DIRECTED). USE AS INSTRUCTED 200 strip PRN   ACCU-CHEK SOFTCLIX LANCETS lancets Use as instructed 100 each 12   allopurinol  (ZYLOPRIM ) 100 MG tablet Take 1 tablet (100 mg total) by mouth 2 (two) times daily. (Patient not taking: Reported on 12/12/2023) 60 tablet 0   AMBULATORY NON FORMULARY MEDICATION Accu-chek aviva plus test strips and accu-chek softclix lancets, use to check blood sugar up to four times a day. Dx Type 2 diabetes. 100 Units 3   atorvastatin  (LIPITOR) 10 MG tablet  Take 1 tablet (10 mg total) by mouth daily. 90 tablet 1   enalapril  (VASOTEC ) 5 MG tablet Take 1 tablet (5 mg total) by mouth daily. (Patient taking differently: Take 5 mg by mouth daily. 2.5 mg daily) 90 tablet 1   Ferrous Sulfate (SLOW FE) 142 (45 Fe) MG TBCR Take 1 tablet by mouth daily. 30 tablet    magnesium  30 MG tablet Take 30 mg by mouth 2 (two) times daily.     metFORMIN  (GLUCOPHAGE ) 500 MG tablet TAKE 2 TABLETS BY MOUTH EVERY DAY WITH BREAKFAST (Patient taking differently: Take 1,000 mg by mouth daily with breakfast.) 180 tablet 3  ondansetron  (ZOFRAN ) 8 MG tablet Take 1 tablet (8 mg total) by mouth every 8 (eight) hours as needed for nausea or vomiting. (Patient not taking: Reported on 12/12/2023) 20 tablet 0   polyethylene glycol (MIRALAX ) packet Take 17 g by mouth daily. 28 each 0   No current facility-administered medications for this visit.    REVIEW OF SYSTEMS:    10 Point review of Systems was done is negative except as noted above.   PHYSICAL EXAMINATION: .There were no vitals taken for this visit.   GENERAL:alert, in no acute distress and comfortable SKIN: no acute rashes, no significant lesions EYES: conjunctiva are pink and non-injected, sclera anicteric OROPHARYNX: MMM, no exudates, no oropharyngeal erythema or ulceration NECK: supple, no JVD LYMPH:  no palpable lymphadenopathy in the cervical, axillary or inguinal regions LUNGS: clear to auscultation b/l with normal respiratory effort HEART: regular rate & rhythm ABDOMEN:  normoactive bowel sounds , non tender, not distended. Extremity: no pedal edema PSYCH: alert & oriented x 3 with fluent speech NEURO: no focal motor/sensory deficits   LABORATORY DATA:  I have reviewed the data as listed  .    Latest Ref Rng & Units 12/12/2023    8:18 AM 10/17/2023   10:54 AM 08/11/2023    2:36 PM  CBC  WBC 4.0 - 10.5 K/uL 64.5  111.8  230.8   Hemoglobin 13.0 - 17.0 g/dL 69.6  29.5  28.4   Hematocrit 39.0 - 52.0 % 36.8   35.1  36.4   Platelets 150 - 400 K/uL 138  144  185    . CBC    Component Value Date/Time   WBC 64.5 (HH) 12/12/2023 0818   WBC 139.2 (HH) 11/15/2021 1345   RBC 3.88 (L) 12/12/2023 0818   HGB 11.5 (L) 12/12/2023 0818   HGB 12.8 (L) 05/29/2017 1141   HCT 36.8 (L) 12/12/2023 0818   HCT 38.7 05/29/2017 1141   PLT 138 (L) 12/12/2023 0818   PLT 151 05/29/2017 1141   MCV 94.8 12/12/2023 0818   MCV 91.3 05/29/2017 1141   MCH 29.6 12/12/2023 0818   MCHC 31.3 12/12/2023 0818   RDW 15.6 (H) 12/12/2023 0818   RDW 13.8 05/29/2017 1141   LYMPHSABS 57.7 (H) 12/12/2023 0818   LYMPHSABS 8.8 (H) 05/29/2017 1141   MONOABS 1.6 (H) 12/12/2023 0818   MONOABS 0.7 05/29/2017 1141   EOSABS 0.1 12/12/2023 0818   EOSABS 0.1 05/29/2017 1141   BASOSABS 0.0 12/12/2023 0818   BASOSABS 0.0 05/29/2017 1141    .    Latest Ref Rng & Units 12/12/2023    8:18 AM 10/17/2023   10:54 AM 08/11/2023    2:36 PM  CMP  Glucose 70 - 99 mg/dL 132  440  102   BUN 8 - 23 mg/dL 28  18  30    Creatinine 0.61 - 1.24 mg/dL 7.25  3.66  4.40   Sodium 135 - 145 mmol/L 141  141  142   Potassium 3.5 - 5.1 mmol/L 4.6  4.6  4.6   Chloride 98 - 111 mmol/L 108  108  108   CO2 22 - 32 mmol/L 26  27  27    Calcium  8.9 - 10.3 mg/dL 9.6  9.6  9.8   Total Protein 6.5 - 8.1 g/dL 7.4  7.1  7.0   Total Bilirubin 0.0 - 1.2 mg/dL 0.7  0.8  0.5   Alkaline Phos 38 - 126 U/L 89  86  97   AST 15 -  41 U/L 18  20  16    ALT 0 - 44 U/L 19  19  14    06/08/17 FISH CLL Prognostic Panel:    06/08/17 Cytogenetics:   05/29/17 Peripheral blood flow cytometry:    RADIOGRAPHIC STUDIES: I have personally reviewed the radiological images as listed and agreed with the findings in the report. No results found.  ASSESSMENT & PLAN:   83 y.o. male with  1. Chronic Lymphocytic Leukemia Rai 1  06/08/17 CLL FISH Prognostic panel negative for del17p, del11q, del13q, indicating intermediate risk 11/15/2019 CT C/A/P ([id number]) revealed gradual lymph  node increase compared to 2018 CT. No major lymphoma progression. Mildly enlarged prostate.    2.  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 -Continue to monitor and manage per dermatology  PLAN:  -Discussed lab results on 02/21/2024 in detail with patient. CBC showed WBC of 35K, hemoglobin of 11.5, and platelets of 126K. -His Hgb is noted to have progressively improved from 8 to now 11.5 -WBCs continue to improve and noted to have come down from 350K eight months ago, to 35K currently, reduced by 90%.  -over the last two months, lymphocytes improved from 58K to 29K  -he is continuing to respond very well to treatment -did not feel any enlarged lymph nodes or enlarged liver or spleen during physical examination -discussed that his tremors can be from certain muscle being targeted more or loss of muscle mass -discussed that there can sometimes be essential tremors with age, and if that is the case, there is sometimes a role for medication -discussed that if his hgb continues to improve, this would sometimes improve tremors.  -discussed that Metformin  can cause vitamin deficiencies and can sometimes produce tremor-like symptoms -recommend patient to start taking vitamin B complex  -discussed that if his B 12 levels are low despite taking B complex, there may be a need to replace B12 in addition -will check his thyroid  function with his next labs in 2 months -discussed option of placing a referral neurology to evaluate tremors or connecting with PCP -patient is inclined to follow-up with his PCP for evaluation of his tremors -discussed that if his tremors are worse or persistent, he shall let us  or his PCP know -discussed option to reduce calquence  dose and see if his tremors improve. Patient is inclined to continune his current calquence  dose at this time -patient has been tolerating his current Calquence  100 mg twice daily with no new or severe toxicities.   -Continue Calquence  100 mg twice daily.  -discussed that his back pain can sometimes cause cramping in the legs as well.  -there is no concern for abnormal heart rhythm at this time -discussed that if his SOB is significant, he should let his PCP or us  know -patient shall return to clinic in 6-8 weeks  FOLLOW-UP: RTC with Dr Salomon Cree with labs in 2 months  The total time spent in the appointment was *** minutes* .  All of the patient's questions were answered with apparent satisfaction. The patient knows to call the clinic with any problems, questions or concerns.   Jacquelyn Matt MD MS AAHIVMS [facility] Hematology/Oncology Physician [facility]  .*Total Encounter Time as defined by the Centers for Medicare and Medicaid Services includes, in addition to the face-to-face time of a patient visit (documented in the note above) non-face-to-face time: obtaining and reviewing outside history, ordering and reviewing medications, tests or procedures, care coordination (communications with other health care professionals  or caregivers) and documentation in the medical record.    I,Mitra Faeizi,acting as a Neurosurgeon for Jacquelyn Matt, MD.,have documented all relevant documentation on the behalf of Jacquelyn Matt, MD,as directed by  Jacquelyn Matt, MD while in the presence of Jacquelyn Matt, MD.  ***

## 2024-02-21 ENCOUNTER — Inpatient Hospital Stay: Attending: Hematology

## 2024-02-21 ENCOUNTER — Inpatient Hospital Stay (HOSPITAL_BASED_OUTPATIENT_CLINIC_OR_DEPARTMENT_OTHER): Admitting: Hematology

## 2024-02-21 VITALS — BP 152/59 | HR 82 | Temp 97.5°F | Resp 18 | Wt 147.2 lb

## 2024-02-21 DIAGNOSIS — C9110 Chronic lymphocytic leukemia of B-cell type not having achieved remission: Secondary | ICD-10-CM

## 2024-02-21 DIAGNOSIS — N400 Enlarged prostate without lower urinary tract symptoms: Secondary | ICD-10-CM | POA: Diagnosis not present

## 2024-02-21 DIAGNOSIS — R252 Cramp and spasm: Secondary | ICD-10-CM | POA: Insufficient documentation

## 2024-02-21 DIAGNOSIS — Z85828 Personal history of other malignant neoplasm of skin: Secondary | ICD-10-CM | POA: Diagnosis not present

## 2024-02-21 DIAGNOSIS — Z87891 Personal history of nicotine dependence: Secondary | ICD-10-CM | POA: Diagnosis not present

## 2024-02-21 DIAGNOSIS — M549 Dorsalgia, unspecified: Secondary | ICD-10-CM | POA: Diagnosis not present

## 2024-02-21 DIAGNOSIS — D649 Anemia, unspecified: Secondary | ICD-10-CM

## 2024-02-21 DIAGNOSIS — R251 Tremor, unspecified: Secondary | ICD-10-CM | POA: Insufficient documentation

## 2024-02-21 LAB — CBC WITH DIFFERENTIAL (CANCER CENTER ONLY)
Abs Immature Granulocytes: 0.06 10*3/uL (ref 0.00–0.07)
Basophils Absolute: 0.1 10*3/uL (ref 0.0–0.1)
Basophils Relative: 0 %
Eosinophils Absolute: 0 10*3/uL (ref 0.0–0.5)
Eosinophils Relative: 0 %
HCT: 36.5 % — ABNORMAL LOW (ref 39.0–52.0)
Hemoglobin: 11.5 g/dL — ABNORMAL LOW (ref 13.0–17.0)
Immature Granulocytes: 0 %
Lymphocytes Relative: 84 %
Lymphs Abs: 29.3 10*3/uL — ABNORMAL HIGH (ref 0.7–4.0)
MCH: 29.1 pg (ref 26.0–34.0)
MCHC: 31.5 g/dL (ref 30.0–36.0)
MCV: 92.4 fL (ref 80.0–100.0)
Monocytes Absolute: 1.7 10*3/uL — ABNORMAL HIGH (ref 0.1–1.0)
Monocytes Relative: 5 %
Neutro Abs: 3.8 10*3/uL (ref 1.7–7.7)
Neutrophils Relative %: 11 %
Platelet Count: 126 10*3/uL — ABNORMAL LOW (ref 150–400)
RBC: 3.95 MIL/uL — ABNORMAL LOW (ref 4.22–5.81)
RDW: 14.9 % (ref 11.5–15.5)
Smear Review: NORMAL
WBC Count: 35 10*3/uL — ABNORMAL HIGH (ref 4.0–10.5)
nRBC: 0 % (ref 0.0–0.2)

## 2024-02-21 LAB — CMP (CANCER CENTER ONLY)
ALT: 16 U/L (ref 0–44)
AST: 18 U/L (ref 15–41)
Albumin: 4.6 g/dL (ref 3.5–5.0)
Alkaline Phosphatase: 85 U/L (ref 38–126)
Anion gap: 7 (ref 5–15)
BUN: 24 mg/dL — ABNORMAL HIGH (ref 8–23)
CO2: 24 mmol/L (ref 22–32)
Calcium: 9.4 mg/dL (ref 8.9–10.3)
Chloride: 108 mmol/L (ref 98–111)
Creatinine: 1.22 mg/dL (ref 0.61–1.24)
GFR, Estimated: 59 mL/min — ABNORMAL LOW (ref >60)
Glucose, Bld: 125 mg/dL — ABNORMAL HIGH (ref 70–99)
Potassium: 5.1 mmol/L (ref 3.5–5.1)
Sodium: 139 mmol/L (ref 135–145)
Total Bilirubin: 0.9 mg/dL (ref 0.0–1.2)
Total Protein: 7 g/dL (ref 6.5–8.1)

## 2024-02-21 LAB — SAMPLE TO BLOOD BANK

## 2024-02-27 ENCOUNTER — Other Ambulatory Visit: Payer: Self-pay

## 2024-02-29 ENCOUNTER — Other Ambulatory Visit (HOSPITAL_COMMUNITY): Payer: Self-pay

## 2024-02-29 ENCOUNTER — Other Ambulatory Visit: Payer: Self-pay | Admitting: Hematology

## 2024-02-29 DIAGNOSIS — C9110 Chronic lymphocytic leukemia of B-cell type not having achieved remission: Secondary | ICD-10-CM

## 2024-03-01 ENCOUNTER — Other Ambulatory Visit: Payer: Self-pay

## 2024-03-01 MED ORDER — CALQUENCE 100 MG PO TABS
100.0000 mg | ORAL_TABLET | Freq: Two times a day (BID) | ORAL | 1 refills | Status: DC
  Filled 2024-03-01 – 2024-03-04 (×2): qty 60, 30d supply, fill #0
  Filled 2024-03-25: qty 60, 30d supply, fill #1

## 2024-03-04 ENCOUNTER — Other Ambulatory Visit: Payer: Self-pay

## 2024-03-04 ENCOUNTER — Encounter (INDEPENDENT_AMBULATORY_CARE_PROVIDER_SITE_OTHER): Payer: Self-pay

## 2024-03-04 NOTE — Progress Notes (Signed)
 Specialty Pharmacy Refill Coordination Note  Tony Banks is a 83 y.o. male contacted today regarding refills of specialty medication(s) Acalabrutinib  Maleate (Calquence )   Patient requested Delivery   Delivery date: 03/05/24   Verified address: (Patient-Rptd) [address] [name]   Medication will be filled on 06.30.25.

## 2024-03-22 ENCOUNTER — Other Ambulatory Visit: Payer: Self-pay

## 2024-03-25 ENCOUNTER — Encounter (INDEPENDENT_AMBULATORY_CARE_PROVIDER_SITE_OTHER): Payer: Self-pay

## 2024-03-25 ENCOUNTER — Other Ambulatory Visit: Payer: Self-pay

## 2024-03-25 NOTE — Progress Notes (Signed)
 Specialty Pharmacy Refill Coordination Note  Tony Banks is a 83 y.o. male contacted today regarding refills of specialty medication(s) Acalabrutinib  Maleate (Calquence )   Patient requested Delivery   Delivery date: 03/28/24   Verified address: (Patient-Rptd) [address] [place]   Medication will be filled on 07.23.25.

## 2024-04-16 ENCOUNTER — Other Ambulatory Visit: Payer: Self-pay

## 2024-04-17 ENCOUNTER — Other Ambulatory Visit (HOSPITAL_COMMUNITY): Payer: Self-pay

## 2024-04-17 ENCOUNTER — Other Ambulatory Visit: Payer: Self-pay

## 2024-04-17 ENCOUNTER — Encounter (INDEPENDENT_AMBULATORY_CARE_PROVIDER_SITE_OTHER): Payer: Self-pay

## 2024-04-17 ENCOUNTER — Other Ambulatory Visit: Payer: Self-pay | Admitting: Hematology

## 2024-04-17 DIAGNOSIS — C9110 Chronic lymphocytic leukemia of B-cell type not having achieved remission: Secondary | ICD-10-CM

## 2024-04-17 MED ORDER — CALQUENCE 100 MG PO TABS
100.0000 mg | ORAL_TABLET | Freq: Two times a day (BID) | ORAL | 1 refills | Status: DC
  Filled 2024-04-17 (×2): qty 60, 30d supply, fill #0
  Filled 2024-05-31 – 2024-06-04 (×2): qty 60, 30d supply, fill #1

## 2024-04-17 NOTE — Progress Notes (Signed)
 Specialty Pharmacy Refill Coordination Note  MyChart Questionnaire Submission  Tony Banks is a 83 y.o. male contacted today regarding refills of specialty medication(s) Calquence .  Doses on hand: (Patient-Rptd) 19 days - 38 tablets/19 days Patient requested: (Patient-Rptd) Delivery   Delivery date: 04/22/24  Verified address: [address] [place] [address]  Medication will be filled on 04/19/24.  This fill date is pending response to refill request from provider. Patient is aware and if they have not received fill by intended date, they must follow up with pharmacy.

## 2024-04-19 ENCOUNTER — Other Ambulatory Visit: Payer: Self-pay

## 2024-04-19 ENCOUNTER — Other Ambulatory Visit (HOSPITAL_COMMUNITY): Payer: Self-pay

## 2024-04-24 ENCOUNTER — Inpatient Hospital Stay: Admitting: Hematology

## 2024-04-24 ENCOUNTER — Ambulatory Visit (HOSPITAL_COMMUNITY)
Admission: RE | Admit: 2024-04-24 | Discharge: 2024-04-24 | Disposition: A | Discharge status: Home / Self Care | Source: Ambulatory Visit | Attending: Hematology | Admitting: Hematology

## 2024-04-24 ENCOUNTER — Inpatient Hospital Stay: Attending: Hematology

## 2024-04-24 VITALS — BP 110/49 | HR 85 | Temp 97.7°F | Resp 18 | Wt 143.5 lb

## 2024-04-24 DIAGNOSIS — Z856 Personal history of leukemia: Secondary | ICD-10-CM | POA: Diagnosis not present

## 2024-04-24 DIAGNOSIS — D649 Anemia, unspecified: Secondary | ICD-10-CM

## 2024-04-24 DIAGNOSIS — I10 Essential (primary) hypertension: Secondary | ICD-10-CM | POA: Insufficient documentation

## 2024-04-24 DIAGNOSIS — N400 Enlarged prostate without lower urinary tract symptoms: Secondary | ICD-10-CM | POA: Diagnosis not present

## 2024-04-24 DIAGNOSIS — Z79899 Other long term (current) drug therapy: Secondary | ICD-10-CM | POA: Diagnosis not present

## 2024-04-24 DIAGNOSIS — Z87891 Personal history of nicotine dependence: Secondary | ICD-10-CM | POA: Insufficient documentation

## 2024-04-24 DIAGNOSIS — R252 Cramp and spasm: Secondary | ICD-10-CM | POA: Insufficient documentation

## 2024-04-24 DIAGNOSIS — R0609 Other forms of dyspnea: Secondary | ICD-10-CM | POA: Insufficient documentation

## 2024-04-24 DIAGNOSIS — C9110 Chronic lymphocytic leukemia of B-cell type not having achieved remission: Secondary | ICD-10-CM

## 2024-04-24 DIAGNOSIS — R0602 Shortness of breath: Secondary | ICD-10-CM | POA: Diagnosis not present

## 2024-04-24 LAB — CMP (CANCER CENTER ONLY)
ALT: 15 U/L (ref 0–44)
AST: 18 U/L (ref 15–41)
Albumin: 4.3 g/dL (ref 3.5–5.0)
Alkaline Phosphatase: 74 U/L (ref 38–126)
Anion gap: 6 (ref 5–15)
BUN: 22 mg/dL (ref 8–23)
CO2: 24 mmol/L (ref 22–32)
Calcium: 9 mg/dL (ref 8.9–10.3)
Chloride: 111 mmol/L (ref 98–111)
Creatinine: 1.13 mg/dL (ref 0.61–1.24)
GFR, Estimated: >60 mL/min (ref >60)
Glucose, Bld: 117 mg/dL — ABNORMAL HIGH (ref 70–99)
Potassium: 4.9 mmol/L (ref 3.5–5.1)
Sodium: 141 mmol/L (ref 135–145)
Total Bilirubin: 0.7 mg/dL (ref 0.0–1.2)
Total Protein: 6.5 g/dL (ref 6.5–8.1)

## 2024-04-24 LAB — CBC WITH DIFFERENTIAL (CANCER CENTER ONLY)
Abs Immature Granulocytes: 0.04 K/uL (ref 0.00–0.07)
Basophils Absolute: 0.1 K/uL (ref 0.0–0.1)
Basophils Relative: 0 %
Eosinophils Absolute: 0 K/uL (ref 0.0–0.5)
Eosinophils Relative: 0 %
HCT: 36.4 % — ABNORMAL LOW (ref 39.0–52.0)
Hemoglobin: 11.4 g/dL — ABNORMAL LOW (ref 13.0–17.0)
Immature Granulocytes: 0 %
Lymphocytes Relative: 73 %
Lymphs Abs: 12.6 K/uL — ABNORMAL HIGH (ref 0.7–4.0)
MCH: 28.8 pg (ref 26.0–34.0)
MCHC: 31.3 g/dL (ref 30.0–36.0)
MCV: 91.9 fL (ref 80.0–100.0)
Monocytes Absolute: 1.2 K/uL — ABNORMAL HIGH (ref 0.1–1.0)
Monocytes Relative: 7 %
Neutro Abs: 3.4 K/uL (ref 1.7–7.7)
Neutrophils Relative %: 20 %
Platelet Count: 124 K/uL — ABNORMAL LOW (ref 150–400)
RBC: 3.96 MIL/uL — ABNORMAL LOW (ref 4.22–5.81)
RDW: 15.4 % (ref 11.5–15.5)
WBC Count: 17.3 K/uL — ABNORMAL HIGH (ref 4.0–10.5)
nRBC: 0 % (ref 0.0–0.2)

## 2024-04-24 LAB — TSH: TSH: 1.48 u[IU]/mL (ref 0.350–4.500)

## 2024-04-24 LAB — VITAMIN B12: Vitamin B-12: 295 pg/mL (ref 180–914)

## 2024-04-24 NOTE — Progress Notes (Signed)
 HEMATOLOGY/ONCOLOGY CLINIC NOTE  Date of Service: .04/24/2024   Patient Care Team: Catalina Bare, MD as PCP - General (Internal Medicine) Beverli Cara PARAS, [facility] (Inactive) as Pharmacist (Pharmacist) [name], Delon CROME, RN as Oncology Nurse Navigator Izell Domino, MD as Consulting Physician (Radiation Oncology) Onesimo Emaline Brink, MD as Consulting Physician (Hematology) Lloyd Savannah, MD as Referring Physician (Dermatology)  CHIEF COMPLAINTS/PURPOSE OF CONSULTATION:  Follow-up for continued evaluation and management of CLL  HISTORY OF PRESENTING ILLNESS:  Please see previous note for details of initial presentation  INTERVAL HISTORY:   Tony Banks is a 83 y.o. male is here for continued valuation and management of his CLL.  He continues to be on Calquence  100 mg p.o. twice daily. His labs today were discussed and show continued improvement in his WBC count and stable hemoglobin and platelets and stable chemistries. He notes some increased shortness of breath over the last couple of months.  He notes this is intermittent but is worse and he is having difficulties singing in church.  He also notes an increased cramping in his lower extremities which is he thinks might be slightly worse than his chronic baseline. He also notes some increased tremulousness on and off. We discussed that we will cut down his dose of Calquence . He is also noting somewhat lower blood pressures and will be seeing his primary care physician in the next week or 2 to address this and adjust his blood pressure meds.  He does note that he is drinking a lot of water. No bleeding issues. No infection issues. No fevers no chills no night sweats no new lumps or bumps.  MEDICAL HISTORY:  Past Medical History:  Diagnosis Date   Aortic atherosclerosis (HCC)    CLL (chronic lymphocytic leukemia) (HCC)    Constipation    severe   Diabetes mellitus without complication (HCC)    Diverticulosis    HLD  (hyperlipidemia)    Hypertension    Nephrolithiasis     SURGICAL HISTORY: Past Surgical History:  Procedure Laterality Date   FINGER FRACTURE SURGERY Right    middle finger   SHOULDER ARTHROSCOPY Bilateral    VASECTOMY      SOCIAL HISTORY: Social History   Socioeconomic History   Marital status: Married    Spouse name: [name]   Number of children: 3   Years of education: 14   Highest education level: Some college, no degree  Occupational History   Occupation: retired  Tobacco Use   Smoking status: Former    Current packs/day: 0.00    Types: Cigarettes    Quit date: 07/31/1977    Years since quitting: 46.7   Smokeless tobacco: Never   Tobacco comments:    quit 30 yrs ago  Vaping Use   Vaping status: Never Used  Substance and Sexual Activity   Alcohol use: No   Drug use: No   Sexual activity: Not Currently    Birth control/protection: Surgical  Other Topics Concern   Not on file  Social History Narrative   Drinks 1-2 cups of coffee. Enjoys reading, watching tv and likes to ride his stationary bike.    Social Drivers of Corporate investment banker Strain: Low Risk  (10/12/2020)   Overall Financial Resource Strain (CARDIA)    Difficulty of Paying Living Expenses: Not hard at all  Food Insecurity: No Food Insecurity (10/12/2020)   Hunger Vital Sign    Worried About Running Out of Food in the Last Year: Never true  Ran Out of Food in the Last Year: Never true  Transportation Needs: No Transportation Needs (10/12/2020)   PRAPARE - Administrator, Civil Service (Medical): No    Lack of Transportation (Non-Medical): No  Physical Activity: Sufficiently Active (10/12/2020)   Exercise Vital Sign    Days of Exercise per Week: 7 days    Minutes of Exercise per Session: 80 min  Stress: No Stress Concern Present (10/12/2020)   [employer] of Occupational Health - Occupational Stress Questionnaire    Feeling of Stress : Not at all  Social Connections:  Moderately Isolated (10/12/2020)   Social Connection and Isolation Panel    Frequency of Communication with Friends and Family: Once a week    Frequency of Social Gatherings with Friends and Family: Once a week    Attends Religious Services: More than 4 times per year    Active Member of [employer] or Organizations: No    Attends Banker Meetings: Never    Marital Status: Married  Catering manager Violence: Not At Risk (10/12/2020)   Humiliation, Afraid, Rape, and Kick questionnaire    Fear of Current or Ex-Partner: No    Emotionally Abused: No    Physically Abused: No    Sexually Abused: No    FAMILY HISTORY: Family History  Problem Relation Age of Onset   Heart disease Mother    Stroke Mother    Cancer Father    Stroke Maternal Grandfather    Colon cancer Neg Hx    Pancreatic cancer Neg Hx    Rectal cancer Neg Hx    Stomach cancer Neg Hx     ALLERGIES:  has no known allergies.  MEDICATIONS:  Current Outpatient Medications  Medication Sig Dispense Refill   acalabrutinib  maleate (CALQUENCE ) 100 MG tablet Take 1 tablet (100 mg total) by mouth 2 (two) times daily. 60 tablet 1   ACCU-CHEK AVIVA PLUS test strip USE DAILY OR UP TO 4 TIMES PER DAY AS DIRECTED). USE AS INSTRUCTED 200 strip PRN   ACCU-CHEK SOFTCLIX LANCETS lancets Use as instructed 100 each 12   AMBULATORY NON FORMULARY MEDICATION Accu-chek aviva plus test strips and accu-chek softclix lancets, use to check blood sugar up to four times a day. Dx Type 2 diabetes. 100 Units 3   atorvastatin  (LIPITOR) 10 MG tablet Take 1 tablet (10 mg total) by mouth daily. 90 tablet 1   enalapril  (VASOTEC ) 5 MG tablet Take 1 tablet (5 mg total) by mouth daily. (Patient taking differently: Take 5 mg by mouth daily. 2.5 mg daily) 90 tablet 1   Ferrous Sulfate (SLOW FE) 142 (45 Fe) MG TBCR Take 1 tablet by mouth daily. 30 tablet    magnesium  30 MG tablet Take 30 mg by mouth 2 (two) times daily.     metFORMIN  (GLUCOPHAGE ) 500 MG  tablet TAKE 2 TABLETS BY MOUTH EVERY DAY WITH BREAKFAST (Patient taking differently: Take 1,000 mg by mouth daily with breakfast.) 180 tablet 3   ondansetron  (ZOFRAN ) 8 MG tablet Take 1 tablet (8 mg total) by mouth every 8 (eight) hours as needed for nausea or vomiting. (Patient not taking: Reported on 12/12/2023) 20 tablet 0   polyethylene glycol (MIRALAX ) packet Take 17 g by mouth daily. 28 each 0   No current facility-administered medications for this visit.    REVIEW OF SYSTEMS:   .10 Point review of Systems was done is negative except as noted above.  PHYSICAL EXAMINATION: .BP (!) 110/49   Pulse 85  Temp 97.7 F (36.5 C)   Resp 18   Wt 143 lb 8 oz (65.1 kg)   SpO2 99%   BMI 21.82 kg/m  . GENERAL:alert, in no acute distress and comfortable SKIN: no acute rashes, no significant lesions EYES: conjunctiva are pink and non-injected, sclera anicteric OROPHARYNX: MMM, no exudates, no oropharyngeal erythema or ulceration NECK: supple, no JVD LYMPH:  no palpable lymphadenopathy in the cervical, axillary or inguinal regions LUNGS: clear to auscultation b/l with normal respiratory effort HEART: regular rate & rhythm ABDOMEN:  normoactive bowel sounds , non tender, not distended.  No palpable hepatosplenomegaly Extremity: no pedal edema PSYCH: alert & oriented x 3 with fluent speech NEURO: no focal motor/sensory deficits   LABORATORY DATA:  I have reviewed the data as listed  .    Latest Ref Rng & Units 04/24/2024    8:26 AM 02/21/2024    8:35 AM 12/12/2023    8:18 AM  CBC  WBC 4.0 - 10.5 K/uL 17.3  35.0  64.5   Hemoglobin 13.0 - 17.0 g/dL 88.5  88.4  88.4   Hematocrit 39.0 - 52.0 % 36.4  36.5  36.8   Platelets 150 - 400 K/uL 124  126  138    . CBC    Component Value Date/Time   WBC 17.3 (H) 04/24/2024 0826   WBC 139.2 (HH) 11/15/2021 1345   RBC 3.96 (L) 04/24/2024 0826   HGB 11.4 (L) 04/24/2024 0826   HGB 12.8 (L) 05/29/2017 1141   HCT 36.4 (L) 04/24/2024 0826   HCT  38.7 05/29/2017 1141   PLT 124 (L) 04/24/2024 0826   PLT 151 05/29/2017 1141   MCV 91.9 04/24/2024 0826   MCV 91.3 05/29/2017 1141   MCH 28.8 04/24/2024 0826   MCHC 31.3 04/24/2024 0826   RDW 15.4 04/24/2024 0826   RDW 13.8 05/29/2017 1141   LYMPHSABS 12.6 (H) 04/24/2024 0826   LYMPHSABS 8.8 (H) 05/29/2017 1141   MONOABS 1.2 (H) 04/24/2024 0826   MONOABS 0.7 05/29/2017 1141   EOSABS 0.0 04/24/2024 0826   EOSABS 0.1 05/29/2017 1141   BASOSABS 0.1 04/24/2024 0826   BASOSABS 0.0 05/29/2017 1141    .    Latest Ref Rng & Units 04/24/2024    8:26 AM 02/21/2024    8:35 AM 12/12/2023    8:18 AM  CMP  Glucose 70 - 99 mg/dL 882  874  864   BUN 8 - 23 mg/dL 22  24  28    Creatinine 0.61 - 1.24 mg/dL 8.86  8.77  8.84   Sodium 135 - 145 mmol/L 141  139  141   Potassium 3.5 - 5.1 mmol/L 4.9  5.1  4.6   Chloride 98 - 111 mmol/L 111  108  108   CO2 22 - 32 mmol/L 24  24  26    Calcium  8.9 - 10.3 mg/dL 9.0  9.4  9.6   Total Protein 6.5 - 8.1 g/dL 6.5  7.0  7.4   Total Bilirubin 0.0 - 1.2 mg/dL 0.7  0.9  0.7   Alkaline Phos 38 - 126 U/L 74  85  89   AST 15 - 41 U/L 18  18  18    ALT 0 - 44 U/L 15  16  19    06/08/17 FISH CLL Prognostic Panel:    06/08/17 Cytogenetics:   05/29/17 Peripheral blood flow cytometry:    RADIOGRAPHIC STUDIES: I have personally reviewed the radiological images as listed and agreed with the findings in  the report. No results found.  ASSESSMENT & PLAN:   83 y.o. male with  1. Chronic Lymphocytic Leukemia Rai 1  06/08/17 CLL FISH Prognostic panel negative for del17p, del11q, del13q, indicating intermediate risk 11/15/2019 CT C/A/P ([id number]) revealed gradual lymph node increase compared to 2018 CT. No major lymphoma progression. Mildly enlarged prostate.    2.  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 -Continue to monitor and manage per dermatology  3.  History of hypertension  4.  Chronic lower extremity cramping and  pain ?  Peripheral arterial disease versus pseudo claudications from lumbar spinal stenosis.  5.  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  6.  Dyspnea on exertion intermittently PLAN:  - We discussed his labs from today in details.  His CBC shows improving WBC count which is now down to 17k with stable hemoglobin in the mid 11's and platelets of 124k - CMP is stable with no acute concerns -Patient has no clinical signs and symptoms of CLL progression at this time. - No notable toxicities from his Calquence  in terms of blood counts or bleeding. - Will get an EKG chest x-ray and echo to evaluate his shortness of breath. - With his increased tremulousness and musculoskeletal discomfort we will cut back the Calquence  to 100 mg once daily for now. - He will follow-up with his primary care physician to evaluate his tremors for possible essential tremors and evaluate his low blood pressures on his current antihypertensive regimen. - He notes no issues with infections or bleeding. - He was recommended to stay up to speed with age-appropriate vaccinations with his primary care physician -Continue dermatology follow-up for skin cancer screening and management  FOLLOW-UP:  EKG and chest x-ray today Echo in 1 to 2 weeks Return to clinic with Dr. Onesimo with labs in 4 weeks   The total time spent in the appointment was 30 minutes*.  All of the patient's questions were answered with apparent satisfaction. The patient knows to call the clinic with any problems, questions or concerns.   Emaline Onesimo MD MS AAHIVMS [facility] Hematology/Oncology Physician [facility]  .*Total Encounter Time as defined by the Centers for Medicare and Medicaid Services includes, in addition to the face-to-face time of a patient visit (documented in the note above) non-face-to-face time: obtaining and reviewing outside history, ordering and reviewing medications, tests or procedures, care coordination  (communications with other health care professionals or caregivers) and documentation in the medical record.

## 2024-05-01 DIAGNOSIS — I959 Hypotension, unspecified: Secondary | ICD-10-CM | POA: Diagnosis not present

## 2024-05-01 DIAGNOSIS — R0602 Shortness of breath: Secondary | ICD-10-CM | POA: Diagnosis not present

## 2024-05-01 DIAGNOSIS — I10 Essential (primary) hypertension: Secondary | ICD-10-CM | POA: Diagnosis not present

## 2024-05-01 DIAGNOSIS — E782 Mixed hyperlipidemia: Secondary | ICD-10-CM | POA: Diagnosis not present

## 2024-05-01 DIAGNOSIS — E1152 Type 2 diabetes mellitus with diabetic peripheral angiopathy with gangrene: Secondary | ICD-10-CM | POA: Diagnosis not present

## 2024-05-01 DIAGNOSIS — C9110 Chronic lymphocytic leukemia of B-cell type not having achieved remission: Secondary | ICD-10-CM | POA: Diagnosis not present

## 2024-05-02 ENCOUNTER — Ambulatory Visit (HOSPITAL_COMMUNITY)
Admission: RE | Admit: 2024-05-02 | Discharge: 2024-05-02 | Disposition: A | Discharge status: Home / Self Care | Source: Ambulatory Visit | Attending: Hematology | Admitting: Hematology

## 2024-05-02 DIAGNOSIS — E785 Hyperlipidemia, unspecified: Secondary | ICD-10-CM | POA: Diagnosis not present

## 2024-05-02 DIAGNOSIS — R0609 Other forms of dyspnea: Secondary | ICD-10-CM | POA: Diagnosis not present

## 2024-05-02 DIAGNOSIS — I10 Essential (primary) hypertension: Secondary | ICD-10-CM | POA: Diagnosis not present

## 2024-05-02 DIAGNOSIS — R0600 Dyspnea, unspecified: Secondary | ICD-10-CM | POA: Diagnosis not present

## 2024-05-02 DIAGNOSIS — E119 Type 2 diabetes mellitus without complications: Secondary | ICD-10-CM | POA: Insufficient documentation

## 2024-05-02 DIAGNOSIS — C9110 Chronic lymphocytic leukemia of B-cell type not having achieved remission: Secondary | ICD-10-CM | POA: Insufficient documentation

## 2024-05-03 ENCOUNTER — Other Ambulatory Visit: Payer: Self-pay

## 2024-05-03 LAB — ECHOCARDIOGRAM COMPLETE
AR max vel: 2.77 cm2
AV Area VTI: 2.3 cm2
AV Area mean vel: 2.15 cm2
AV Mean grad: 10 mmHg
AV Peak grad: 14.7 mmHg
Ao pk vel: 1.92 m/s
Area-P 1/2: 3.83 cm2
Calc EF: 51.7 %
S' Lateral: 2.6 cm
Single Plane A2C EF: 50.7 %
Single Plane A4C EF: 51.4 %

## 2024-05-08 ENCOUNTER — Other Ambulatory Visit: Payer: Self-pay

## 2024-05-08 NOTE — Progress Notes (Signed)
 Specialty Pharmacy Ongoing Clinical Assessment Note  Tony Banks is a 83 y.o. male who is being followed by the specialty pharmacy service for RxSp Oncology   Patient's specialty medication(s) reviewed today: Acalabrutinib  Maleate (Calquence )   Missed doses in the last 4 weeks: 0   Patient/Caregiver did not have any additional questions or concerns.   Therapeutic benefit summary: Patient is achieving benefit   Adverse events/side effects summary: Experienced adverse events/side effects (fatigue - decreased dose per MD, but also may be due to BP meds/low BP)   Patient's therapy is appropriate to: Continue    Goals Addressed             This Visit's Progress    Slow Disease Progression   On track    Patient is on track. Patient will maintain adherence. Provider notes from 04/24/24 state that labs are stable and patient has no clinical signs/symptoms of progression at this time.            Follow up: 3 months  [facility]

## 2024-05-27 ENCOUNTER — Other Ambulatory Visit: Payer: Self-pay

## 2024-05-27 DIAGNOSIS — C9110 Chronic lymphocytic leukemia of B-cell type not having achieved remission: Secondary | ICD-10-CM

## 2024-05-28 ENCOUNTER — Inpatient Hospital Stay: Admitting: Hematology

## 2024-05-28 ENCOUNTER — Inpatient Hospital Stay: Attending: Hematology

## 2024-05-28 VITALS — BP 102/58 | HR 83 | Temp 97.5°F | Resp 18 | Wt 140.7 lb

## 2024-05-28 DIAGNOSIS — N400 Enlarged prostate without lower urinary tract symptoms: Secondary | ICD-10-CM | POA: Insufficient documentation

## 2024-05-28 DIAGNOSIS — R0609 Other forms of dyspnea: Secondary | ICD-10-CM | POA: Insufficient documentation

## 2024-05-28 DIAGNOSIS — R252 Cramp and spasm: Secondary | ICD-10-CM | POA: Diagnosis not present

## 2024-05-28 DIAGNOSIS — I10 Essential (primary) hypertension: Secondary | ICD-10-CM | POA: Diagnosis not present

## 2024-05-28 DIAGNOSIS — C9110 Chronic lymphocytic leukemia of B-cell type not having achieved remission: Secondary | ICD-10-CM

## 2024-05-28 DIAGNOSIS — R0602 Shortness of breath: Secondary | ICD-10-CM

## 2024-05-28 DIAGNOSIS — Z85828 Personal history of other malignant neoplasm of skin: Secondary | ICD-10-CM | POA: Diagnosis not present

## 2024-05-28 DIAGNOSIS — Z809 Family history of malignant neoplasm, unspecified: Secondary | ICD-10-CM | POA: Insufficient documentation

## 2024-05-28 DIAGNOSIS — Z87891 Personal history of nicotine dependence: Secondary | ICD-10-CM | POA: Diagnosis not present

## 2024-05-28 LAB — CBC WITH DIFFERENTIAL (CANCER CENTER ONLY)
Abs Immature Granulocytes: 0.02 K/uL (ref 0.00–0.07)
Basophils Absolute: 0 K/uL (ref 0.0–0.1)
Basophils Relative: 0 %
Eosinophils Absolute: 0 K/uL (ref 0.0–0.5)
Eosinophils Relative: 0 %
HCT: 36.6 % — ABNORMAL LOW (ref 39.0–52.0)
Hemoglobin: 12.1 g/dL — ABNORMAL LOW (ref 13.0–17.0)
Immature Granulocytes: 0 %
Lymphocytes Relative: 65 %
Lymphs Abs: 7.4 K/uL — ABNORMAL HIGH (ref 0.7–4.0)
MCH: 28.9 pg (ref 26.0–34.0)
MCHC: 33.1 g/dL (ref 30.0–36.0)
MCV: 87.6 fL (ref 80.0–100.0)
Monocytes Absolute: 0.9 K/uL (ref 0.1–1.0)
Monocytes Relative: 8 %
Neutro Abs: 3 K/uL (ref 1.7–7.7)
Neutrophils Relative %: 27 %
Platelet Count: 123 K/uL — ABNORMAL LOW (ref 150–400)
RBC: 4.18 MIL/uL — ABNORMAL LOW (ref 4.22–5.81)
RDW: 14.8 % (ref 11.5–15.5)
Smear Review: NORMAL
WBC Count: 11.4 K/uL — ABNORMAL HIGH (ref 4.0–10.5)
nRBC: 0 % (ref 0.0–0.2)

## 2024-05-28 LAB — CMP (CANCER CENTER ONLY)
ALT: 16 U/L (ref 0–44)
AST: 19 U/L (ref 15–41)
Albumin: 4.2 g/dL (ref 3.5–5.0)
Alkaline Phosphatase: 83 U/L (ref 38–126)
Anion gap: 7 (ref 5–15)
BUN: 21 mg/dL (ref 8–23)
CO2: 25 mmol/L (ref 22–32)
Calcium: 8.8 mg/dL — ABNORMAL LOW (ref 8.9–10.3)
Chloride: 107 mmol/L (ref 98–111)
Creatinine: 1.13 mg/dL (ref 0.61–1.24)
GFR, Estimated: >60 mL/min (ref >60)
Glucose, Bld: 101 mg/dL — ABNORMAL HIGH (ref 70–99)
Potassium: 4.3 mmol/L (ref 3.5–5.1)
Sodium: 139 mmol/L (ref 135–145)
Total Bilirubin: 0.7 mg/dL (ref 0.0–1.2)
Total Protein: 6.6 g/dL (ref 6.5–8.1)

## 2024-05-28 LAB — SAMPLE TO BLOOD BANK

## 2024-05-29 ENCOUNTER — Other Ambulatory Visit: Payer: Self-pay

## 2024-05-31 ENCOUNTER — Other Ambulatory Visit: Payer: Self-pay

## 2024-05-31 ENCOUNTER — Ambulatory Visit (HOSPITAL_COMMUNITY)
Admission: RE | Admit: 2024-05-31 | Discharge: 2024-05-31 | Disposition: A | Discharge status: Home / Self Care | Source: Ambulatory Visit | Attending: Hematology | Admitting: Hematology

## 2024-05-31 ENCOUNTER — Encounter (HOSPITAL_COMMUNITY): Payer: Self-pay

## 2024-05-31 DIAGNOSIS — J8410 Pulmonary fibrosis, unspecified: Secondary | ICD-10-CM | POA: Diagnosis not present

## 2024-05-31 DIAGNOSIS — R0602 Shortness of breath: Secondary | ICD-10-CM | POA: Insufficient documentation

## 2024-05-31 DIAGNOSIS — J439 Emphysema, unspecified: Secondary | ICD-10-CM | POA: Diagnosis not present

## 2024-06-03 NOTE — Progress Notes (Signed)
 HEMATOLOGY/ONCOLOGY CLINIC NOTE  Date of Service: .05/28/2024   Patient Care Team: Catalina Bare, MD as PCP - General (Internal Medicine) Beverli Cara PARAS, [facility] (Inactive) as Pharmacist (Pharmacist) [name], Delon CROME, RN as Oncology Nurse Navigator Izell Domino, MD as Consulting Physician (Radiation Oncology) Onesimo Emaline Brink, MD as Consulting Physician (Hematology) Lloyd Savannah, MD as Referring Physician (Dermatology)  CHIEF COMPLAINTS/PURPOSE OF CONSULTATION:  Follow-up for continued evaluation and management of CLL  HISTORY OF PRESENTING ILLNESS:  Please see previous note for details of initial presentation  INTERVAL HISTORY:   Tony Banks is a 83 y.o. 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 He notes that he is tolerating his Calquence  well with no abnormal bleeding or bruising.  No palpitations or chest pain.SABRA He still notes some dyspnea on exertion. Echo was done which showed normal ejection fraction Chest x-ray from 04/28/2024 showed no active cardiopulmonary disease. We discussed and is agreeable with getting an HRCT of the chest. Denies any infection issues, new fatigue or abnormal bleeding issues.  MEDICAL HISTORY:  Past Medical History:  Diagnosis Date   Aortic atherosclerosis    CLL (chronic lymphocytic leukemia) (HCC)    Constipation    severe   Diabetes mellitus without complication (HCC)    Diverticulosis    HLD (hyperlipidemia)    Hypertension    Nephrolithiasis     SURGICAL HISTORY: Past Surgical History:  Procedure Laterality Date   FINGER FRACTURE SURGERY Right    middle finger   SHOULDER ARTHROSCOPY Bilateral    VASECTOMY      SOCIAL HISTORY: Social History   Socioeconomic History   Marital status: Married    Spouse name: [name]   Number of children: 3   Years of education: 14   Highest education level: Some college, no degree  Occupational History   Occupation: retired  Tobacco Use   Smoking  status: Former    Current packs/day: 0.00    Types: Cigarettes    Quit date: 07/31/1977    Years since quitting: 46.8   Smokeless tobacco: Never   Tobacco comments:    quit 30 yrs ago  Vaping Use   Vaping status: Never Used  Substance and Sexual Activity   Alcohol use: No   Drug use: No   Sexual activity: Not Currently    Birth control/protection: Surgical  Other Topics Concern   Not on file  Social History Narrative   Drinks 1-2 cups of coffee. Enjoys reading, watching tv and likes to ride his stationary bike.    Social Drivers of Corporate investment banker Strain: Low Risk  (10/12/2020)   Overall Financial Resource Strain (CARDIA)    Difficulty of Paying Living Expenses: Not hard at all  Food Insecurity: No Food Insecurity (10/12/2020)   Hunger Vital Sign    Worried About Running Out of Food in the Last Year: Never true    Ran Out of Food in the Last Year: Never true  Transportation Needs: No Transportation Needs (10/12/2020)   PRAPARE - Administrator, Civil Service (Medical): No    Lack of Transportation (Non-Medical): No  Physical Activity: Sufficiently Active (10/12/2020)   Exercise Vital Sign    Days of Exercise per Week: 7 days    Minutes of Exercise per Session: 80 min  Stress: No Stress Concern Present (10/12/2020)   [employer] of Occupational Health - Occupational Stress Questionnaire    Feeling of Stress : Not at all  Social Connections: Moderately Isolated (10/12/2020)   Social Connection and Isolation Panel    Frequency of Communication with Friends and Family: Once a week    Frequency of Social Gatherings with Friends and Family: Once a week    Attends Religious Services: More than 4 times per year    Active Member of [employer] or Organizations: No    Attends Banker Meetings: Never    Marital Status: Married  Catering manager Violence: Not At Risk (10/12/2020)   Humiliation, Afraid, Rape, and Kick questionnaire    Fear of Current or  Ex-Partner: No    Emotionally Abused: No    Physically Abused: No    Sexually Abused: No    FAMILY HISTORY: Family History  Problem Relation Age of Onset   Heart disease Mother    Stroke Mother    Cancer Father    Stroke Maternal Grandfather    Colon cancer Neg Hx    Pancreatic cancer Neg Hx    Rectal cancer Neg Hx    Stomach cancer Neg Hx     ALLERGIES:  has no known allergies.  MEDICATIONS:  Current Outpatient Medications  Medication Sig Dispense Refill   acalabrutinib  maleate (CALQUENCE ) 100 MG tablet Take 1 tablet (100 mg total) by mouth 2 (two) times daily. 60 tablet 1   ACCU-CHEK AVIVA PLUS test strip USE DAILY OR UP TO 4 TIMES PER DAY AS DIRECTED). USE AS INSTRUCTED 200 strip PRN   ACCU-CHEK SOFTCLIX LANCETS lancets Use as instructed 100 each 12   AMBULATORY NON FORMULARY MEDICATION Accu-chek aviva plus test strips and accu-chek softclix lancets, use to check blood sugar up to four times a day. Dx Type 2 diabetes. 100 Units 3   atorvastatin  (LIPITOR) 10 MG tablet Take 1 tablet (10 mg total) by mouth daily. 90 tablet 1   enalapril  (VASOTEC ) 5 MG tablet Take 1 tablet (5 mg total) by mouth daily. (Patient taking differently: Take 5 mg by mouth daily. 2.5 mg daily) 90 tablet 1   Ferrous Sulfate (SLOW FE) 142 (45 Fe) MG TBCR Take 1 tablet by mouth daily. 30 tablet    magnesium  30 MG tablet Take 30 mg by mouth 2 (two) times daily.     metFORMIN  (GLUCOPHAGE ) 500 MG tablet TAKE 2 TABLETS BY MOUTH EVERY DAY WITH BREAKFAST (Patient taking differently: Take 1,000 mg by mouth daily with breakfast.) 180 tablet 3   ondansetron  (ZOFRAN ) 8 MG tablet Take 1 tablet (8 mg total) by mouth every 8 (eight) hours as needed for nausea or vomiting. (Patient not taking: Reported on 12/12/2023) 20 tablet 0   polyethylene glycol (MIRALAX ) packet Take 17 g by mouth daily. 28 each 0   No current facility-administered medications for this visit.    REVIEW OF SYSTEMS:   .10 Point review of Systems was  done is negative except as noted above.   PHYSICAL EXAMINATION: .BP (!) 102/58   Pulse 83   Temp (!) 97.5 F (36.4 C)   Resp 18   Wt 140 lb 11.2 oz (63.8 kg)   SpO2 99%   BMI 21.39 kg/m  NAD GENERAL:alert, in no acute distress and comfortable SKIN: no acute rashes, no significant lesions EYES: conjunctiva are pink and non-injected, sclera anicteric OROPHARYNX: MMM, no exudates, no oropharyngeal erythema or ulceration NECK: supple, no JVD LYMPH:  no palpable lymphadenopathy in the cervical, axillary or inguinal regions LUNGS: clear to auscultation b/l with normal respiratory effort HEART: regular rate & rhythm ABDOMEN:  normoactive bowel  sounds , non tender, not distended. Extremity: no pedal edema PSYCH: alert & oriented x 3 with fluent speech NEURO: no focal motor/sensory deficits    LABORATORY DATA:  I have reviewed the data as listed  .    Latest Ref Rng & Units 05/28/2024    1:56 PM 04/24/2024    8:26 AM 02/21/2024    8:35 AM  CBC  WBC 4.0 - 10.5 K/uL 11.4  17.3  35.0   Hemoglobin 13.0 - 17.0 g/dL 87.8  88.5  88.4   Hematocrit 39.0 - 52.0 % 36.6  36.4  36.5   Platelets 150 - 400 K/uL 123  124  126    . CBC    Component Value Date/Time   WBC 11.4 (H) 05/28/2024 1356   WBC 139.2 (HH) 11/15/2021 1345   RBC 4.18 (L) 05/28/2024 1356   HGB 12.1 (L) 05/28/2024 1356   HGB 12.8 (L) 05/29/2017 1141   HCT 36.6 (L) 05/28/2024 1356   HCT 38.7 05/29/2017 1141   PLT 123 (L) 05/28/2024 1356   PLT 151 05/29/2017 1141   MCV 87.6 05/28/2024 1356   MCV 91.3 05/29/2017 1141   MCH 28.9 05/28/2024 1356   MCHC 33.1 05/28/2024 1356   RDW 14.8 05/28/2024 1356   RDW 13.8 05/29/2017 1141   LYMPHSABS 7.4 (H) 05/28/2024 1356   LYMPHSABS 8.8 (H) 05/29/2017 1141   MONOABS 0.9 05/28/2024 1356   MONOABS 0.7 05/29/2017 1141   EOSABS 0.0 05/28/2024 1356   EOSABS 0.1 05/29/2017 1141   BASOSABS 0.0 05/28/2024 1356   BASOSABS 0.0 05/29/2017 1141    .    Latest Ref Rng & Units  05/28/2024    1:56 PM 04/24/2024    8:26 AM 02/21/2024    8:35 AM  CMP  Glucose 70 - 99 mg/dL 898  882  874   BUN 8 - 23 mg/dL 21  22  24    Creatinine 0.61 - 1.24 mg/dL 8.86  8.86  8.77   Sodium 135 - 145 mmol/L 139  141  139   Potassium 3.5 - 5.1 mmol/L 4.3  4.9  5.1   Chloride 98 - 111 mmol/L 107  111  108   CO2 22 - 32 mmol/L 25  24  24    Calcium  8.9 - 10.3 mg/dL 8.8  9.0  9.4   Total Protein 6.5 - 8.1 g/dL 6.6  6.5  7.0   Total Bilirubin 0.0 - 1.2 mg/dL 0.7  0.7  0.9   Alkaline Phos 38 - 126 U/L 83  74  85   AST 15 - 41 U/L 19  18  18    ALT 0 - 44 U/L 16  15  16    06/08/17 FISH CLL Prognostic Panel:    06/08/17 Cytogenetics:   05/29/17 Peripheral blood flow cytometry:    RADIOGRAPHIC STUDIES: I have personally reviewed the radiological images as listed and agreed with the findings in the report. 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 High resolution imaging of the lungs, as well as inspiratory and expiratory imaging, was performed. RADIATION DOSE REDUCTION: This exam was performed according to the departmental dose-optimization program which includes automated exposure control, adjustment of the mA and/or kV according to patient size and/or use of iterative reconstruction technique. COMPARISON:  CT  chest abdomen pelvis, 11/02/2022, 11/15/2019 FINDINGS: Cardiovascular: Aortic atherosclerosis. Normal heart size. Three-vessel coronary artery calcifications. No pericardial effusion. Mediastinum/Nodes: No enlarged mediastinal, hilar, or axillary lymph nodes. Thyroid  gland, trachea, and esophagus demonstrate no significant findings. 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 No significant air trapping on expiratory phase imaging. Mild underlying centrilobular emphysema. No pleural effusion or pneumothorax. Upper Abdomen: No acute abnormality. Musculoskeletal: No chest wall abnormality. No acute osseous findings. IMPRESSION: 1. 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 Am JINNY Honey Crit Care Med Vol 198, Iss 5, [phone], May 06 2017. 2. Mild underlying centrilobular emphysema. 3. No evidence of lymphadenopathy or metastatic disease in the chest. 4. Coronary artery disease. Aortic Atherosclerosis (ICD10-I70.0) and Emphysema (ICD10-J43.9). Electronically Signed   By: Marolyn JONETTA Jaksch M.D.   On: 06/03/2024 10:59    ASSESSMENT & PLAN:   83 y.o. male with  1. Chronic Lymphocytic Leukemia Rai 1  06/08/17 CLL FISH Prognostic panel negative for del17p, del11q, del13q, indicating intermediate risk 11/15/2019 CT C/A/P ([id number]) revealed gradual lymph node increase compared to 2018 CT. No major lymphoma progression. Mildly enlarged prostate.    2.  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 -Continue to monitor and manage per dermatology  3.  History of hypertension  4.  Chronic lower extremity cramping and pain ?  Peripheral arterial disease  versus pseudo claudications from lumbar spinal stenosis.  5.  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  6.  Dyspnea on exertion intermittently PLAN:  - Patient's lab results from today 05/29/2023 were discussed with him in details -CBC shows continued improvement in his hemoglobin up to 12.1, platelet count is stable at 123k and WBC count continues to improve and is down to 11.4k from a peak of about 250k. - No abnormal bleeding or bruising, infections, and new fatigue Patient has dyspnea on exertion his echo showed normal ejection fraction and chest x-ray showed no acute cardiopulmonary findings. We discussed that we would like to get a high-resolution CT scan of the chest to evaluate his lungs further.  He is agreeable with this. - We discussed the risk of interstitial pneumonitis with Calquence  is less than 1% and not very common. - Recommended he stay up to speed with age-appropriate cancer screening and vaccinations with PCP. - Continue Calquence  100 mg p.o. twice daily.  FOLLOW-UP: HRCT chest in 1 week RTC with Dr Onesimo with labs in 4 weeks   The total time spent in the appointment was 30 minutes*.  All of the patient's questions were answered with apparent satisfaction. The patient knows to call the clinic with any problems, questions or concerns.   Emaline Onesimo MD MS AAHIVMS [facility] Hematology/Oncology Physician [facility]  .*Total Encounter Time as defined by the Centers for Medicare and Medicaid Services includes, in addition to the face-to-face time of a patient visit (documented in the note above) non-face-to-face time: obtaining and reviewing outside history,  ordering and reviewing medications, tests or procedures, care coordination (communications with other health care professionals or caregivers) and documentation in the medical record.

## 2024-06-04 ENCOUNTER — Other Ambulatory Visit: Payer: Self-pay

## 2024-06-04 NOTE — Progress Notes (Signed)
 Specialty Pharmacy Refill Coordination Note  Tony Banks is a 82 y.o. male contacted today regarding refills of specialty medication(s) Acalabrutinib  Maleate (Calquence )   Patient requested Delivery   Delivery date: 06/11/24   Verified address: [address] [place] [address]   Medication will be filled on 10.06.25.

## 2024-06-21 ENCOUNTER — Other Ambulatory Visit (HOSPITAL_COMMUNITY): Payer: Self-pay

## 2024-06-21 ENCOUNTER — Telehealth: Payer: Self-pay

## 2024-06-21 NOTE — Telephone Encounter (Signed)
 Oral Oncology Patient Advocate Encounter   Was successful in securing patient a $ 4500 grant from [employer] (was LLS) to provide copayment coverage for his Calquence .  This will keep the out of pocket expense at $0.     The billing information is as follows and has been shared with [facility].   Member ID: [id number] Group ID: [id number] RxBin: [id number] Dates of Eligibility: 06/07/24 through 06/21/2025  Fund:  CLL  Lucie Lamer, CPhT [place]  [facility] Oncology Pharmacy Patient Advocate Specialist II [place] Phone: [phone]  Fax: [phone] [email or web address]

## 2024-06-24 ENCOUNTER — Other Ambulatory Visit: Payer: Self-pay

## 2024-06-24 DIAGNOSIS — C9110 Chronic lymphocytic leukemia of B-cell type not having achieved remission: Secondary | ICD-10-CM

## 2024-06-25 ENCOUNTER — Inpatient Hospital Stay: Attending: Hematology

## 2024-06-25 ENCOUNTER — Inpatient Hospital Stay: Admitting: Hematology

## 2024-06-25 VITALS — BP 118/48 | HR 94 | Temp 97.9°F | Resp 18 | Wt 143.3 lb

## 2024-06-25 DIAGNOSIS — Z87891 Personal history of nicotine dependence: Secondary | ICD-10-CM | POA: Diagnosis not present

## 2024-06-25 DIAGNOSIS — C9110 Chronic lymphocytic leukemia of B-cell type not having achieved remission: Secondary | ICD-10-CM | POA: Insufficient documentation

## 2024-06-25 DIAGNOSIS — R252 Cramp and spasm: Secondary | ICD-10-CM | POA: Diagnosis not present

## 2024-06-25 DIAGNOSIS — R0609 Other forms of dyspnea: Secondary | ICD-10-CM | POA: Diagnosis not present

## 2024-06-25 DIAGNOSIS — I10 Essential (primary) hypertension: Secondary | ICD-10-CM | POA: Diagnosis not present

## 2024-06-25 DIAGNOSIS — N400 Enlarged prostate without lower urinary tract symptoms: Secondary | ICD-10-CM | POA: Insufficient documentation

## 2024-06-25 LAB — CBC WITH DIFFERENTIAL (CANCER CENTER ONLY)
Abs Immature Granulocytes: 0.04 K/uL (ref 0.00–0.07)
Basophils Absolute: 0.1 K/uL (ref 0.0–0.1)
Basophils Relative: 0 %
Eosinophils Absolute: 0 K/uL (ref 0.0–0.5)
Eosinophils Relative: 0 %
HCT: 37.1 % — ABNORMAL LOW (ref 39.0–52.0)
Hemoglobin: 12.1 g/dL — ABNORMAL LOW (ref 13.0–17.0)
Immature Granulocytes: 0 %
Lymphocytes Relative: 63 %
Lymphs Abs: 7.1 K/uL — ABNORMAL HIGH (ref 0.7–4.0)
MCH: 28.8 pg (ref 26.0–34.0)
MCHC: 32.6 g/dL (ref 30.0–36.0)
MCV: 88.3 fL (ref 80.0–100.0)
Monocytes Absolute: 1 K/uL (ref 0.1–1.0)
Monocytes Relative: 9 %
Neutro Abs: 3.2 K/uL (ref 1.7–7.7)
Neutrophils Relative %: 28 %
Platelet Count: 107 K/uL — ABNORMAL LOW (ref 150–400)
RBC: 4.2 MIL/uL — ABNORMAL LOW (ref 4.22–5.81)
RDW: 15.5 % (ref 11.5–15.5)
Smear Review: NORMAL
WBC Count: 11.5 K/uL — ABNORMAL HIGH (ref 4.0–10.5)
nRBC: 0 % (ref 0.0–0.2)

## 2024-06-25 LAB — CMP (CANCER CENTER ONLY)
ALT: 16 U/L (ref 0–44)
AST: 19 U/L (ref 15–41)
Albumin: 4.2 g/dL (ref 3.5–5.0)
Alkaline Phosphatase: 87 U/L (ref 38–126)
Anion gap: 6 (ref 5–15)
BUN: 17 mg/dL (ref 8–23)
CO2: 25 mmol/L (ref 22–32)
Calcium: 9.8 mg/dL (ref 8.9–10.3)
Chloride: 110 mmol/L (ref 98–111)
Creatinine: 1.03 mg/dL (ref 0.61–1.24)
GFR, Estimated: >60 mL/min (ref >60)
Glucose, Bld: 110 mg/dL — ABNORMAL HIGH (ref 70–99)
Potassium: 5.3 mmol/L — ABNORMAL HIGH (ref 3.5–5.1)
Sodium: 141 mmol/L (ref 135–145)
Total Bilirubin: 0.8 mg/dL (ref 0.0–1.2)
Total Protein: 6.6 g/dL (ref 6.5–8.1)

## 2024-06-25 NOTE — Progress Notes (Signed)
 HEMATOLOGY/ONCOLOGY CLINIC NOTE  Date of Service: 06/25/2024  Patient Care Team: Tony Bare, MD as PCP - General (Internal Medicine) Tony Banks, [facility] (Inactive) as Pharmacist (Pharmacist) [name], Tony CROME, RN as Oncology Nurse Navigator Tony Domino, MD as Consulting Physician (Radiation Oncology) Onesimo Tony Brink, MD as Consulting Physician (Hematology) Tony Savannah, MD as Referring Physician (Dermatology)  CHIEF COMPLAINTS/PURPOSE OF CONSULTATION:  Follow-up for continued evaluation and management of CLL  HISTORY OF PRESENTING ILLNESS:  (03/22/2018) Tony Banks is a wonderful 83 y.o. male who has been previously followed by my colleague Dr. 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 He is accompanied today by his wife. The pt reports that he is doing well overall. Verbal consent has been given by the pt for a clinical observer, Anushka Kaiwar, to be present.     The pt was diagnosed with CLL in October 2018, following a Peripheral blood flow cytometry and cytogenetics report. 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 The pt notes that his CLL was picked up off of routine labs and was not prompted by any associated symptoms.    The pt reports that he has not had any symptoms or concerns in the last 3 months he denies any constitutional symptoms or noticing any new lumps or bumps.     Most recent lab results (03/22/18) of CBC w/diff, CMP is as follows: all values are WNL except for WBC at 18.4k, Lymphs abs at 11.9k, Glucose at 128. LDH 03/22/18 is normal at 160 QIG 03/22/18 is pending   On review of systems, pt reports good energy levels, and denies fevers, chills, night sweats, unexpected weight loss, noticing any new lumps or bumps, abdominal pains, and any other symptoms.   INTERVAL HISTORY:   Tony D Plotz is a 84 y.o. male is here for continued evaluation and  management of his CLL.  Last seen on 05/28/2024 and said that he is tolerating his Calquence  well with no abnormal bleeding or bruising, infection issues, new fatigue or abnormal bleeding issues. No palpitations or chest pain. Noted persisting dyspnea on exertion. There was an echo 05/02/2024 done which showed normal ejection fraction. CXR 04/28/2024 showed no active cardiopulmonary disease.  Today, he says that since he's discontinued his Jardiance he's felt better as he was experiencing arthralgias/myalgias in his lower extremities.  He reports that his breathing has improved, which also occurred after stopping Jardiance, only having some difficulty after standing up. He does believe that Calquence  was also contributing to his shortness of breath as he began to have difficulty breathing after he started taking this. Denies exposure to poultry/pigeons or using comforters/pillows with down in it.   MEDICAL HISTORY:  Past Medical History:  Diagnosis Date   Aortic atherosclerosis    CLL (chronic lymphocytic leukemia) (HCC)    Constipation    severe   Diabetes mellitus without complication (HCC)    Diverticulosis    HLD (hyperlipidemia)    Hypertension    Nephrolithiasis     SURGICAL HISTORY: Past Surgical History:  Procedure Laterality Date   FINGER FRACTURE SURGERY Right    middle finger   SHOULDER ARTHROSCOPY Bilateral    VASECTOMY      SOCIAL HISTORY: Social History   Socioeconomic History   Marital status: Married    Spouse name: [name]   Number of children: 3   Years of education: 14   Highest education level: Some college,  no degree  Occupational History   Occupation: retired  Tobacco Use   Smoking status: Former    Current packs/day: 0.00    Types: Cigarettes    Quit date: 07/31/1977    Years since quitting: 46.9   Smokeless tobacco: Never   Tobacco comments:    quit 30 yrs ago  Vaping Use   Vaping status: Never Used  Substance and Sexual Activity   Alcohol  use: No   Drug use: No   Sexual activity: Not Currently    Birth control/protection: Surgical  Other Topics Concern   Not on file  Social History Narrative   Drinks 1-2 cups of coffee. Enjoys reading, watching tv and likes to ride his stationary bike.    Social Drivers of Corporate Investment Banker Strain: Low Risk  (10/12/2020)   Overall Financial Resource Strain (CARDIA)    Difficulty of Paying Living Expenses: Not hard at all  Food Insecurity: No Food Insecurity (10/12/2020)   Hunger Vital Sign    Worried About Running Out of Food in the Last Year: Never true    Ran Out of Food in the Last Year: Never true  Transportation Needs: No Transportation Needs (10/12/2020)   PRAPARE - Administrator, Civil Service (Medical): No    Lack of Transportation (Non-Medical): No  Physical Activity: Sufficiently Active (10/12/2020)   Exercise Vital Sign    Days of Exercise per Week: 7 days    Minutes of Exercise per Session: 80 min  Stress: No Stress Concern Present (10/12/2020)   [employer] of Occupational Health - Occupational Stress Questionnaire    Feeling of Stress : Not at all  Social Connections: Moderately Isolated (10/12/2020)   Social Connection and Isolation Panel    Frequency of Communication with Friends and Family: Once a week    Frequency of Social Gatherings with Friends and Family: Once a week    Attends Religious Services: More than 4 times per year    Active Member of [employer] or Organizations: No    Attends Banker Meetings: Never    Marital Status: Married  Catering Manager Violence: Not At Risk (10/12/2020)   Humiliation, Afraid, Rape, and Kick questionnaire    Fear of Current or Ex-Partner: No    Emotionally Abused: No    Physically Abused: No    Sexually Abused: No    FAMILY HISTORY: Family History  Problem Relation Age of Onset   Heart disease Mother    Stroke Mother    Cancer Father    Stroke Maternal Grandfather    Colon cancer Neg Hx     Pancreatic cancer Neg Hx    Rectal cancer Neg Hx    Stomach cancer Neg Hx     ALLERGIES:  has no known allergies.  MEDICATIONS:  Current Outpatient Medications  Medication Sig Dispense Refill   acalabrutinib  maleate (CALQUENCE ) 100 MG tablet Take 1 tablet (100 mg total) by mouth 2 (two) times daily. 60 tablet 1   ACCU-CHEK AVIVA PLUS test strip USE DAILY OR UP TO 4 TIMES PER DAY AS DIRECTED). USE AS INSTRUCTED 200 strip PRN   ACCU-CHEK SOFTCLIX LANCETS lancets Use as instructed 100 each 12   AMBULATORY NON FORMULARY MEDICATION Accu-chek aviva plus test strips and accu-chek softclix lancets, use to check blood sugar up to four times a day. Dx Type 2 diabetes. 100 Units 3   atorvastatin  (LIPITOR) 10 MG tablet Take 1 tablet (10 mg total) by mouth daily. 90  tablet 1   enalapril  (VASOTEC ) 5 MG tablet Take 1 tablet (5 mg total) by mouth daily. (Patient taking differently: Take 5 mg by mouth daily. 2.5 mg daily) 90 tablet 1   Ferrous Sulfate (SLOW FE) 142 (45 Fe) MG TBCR Take 1 tablet by mouth daily. 30 tablet    magnesium  30 MG tablet Take 30 mg by mouth 2 (two) times daily.     metFORMIN  (GLUCOPHAGE ) 500 MG tablet TAKE 2 TABLETS BY MOUTH EVERY DAY WITH BREAKFAST (Patient taking differently: Take 1,000 mg by mouth daily with breakfast.) 180 tablet 3   ondansetron  (ZOFRAN ) 8 MG tablet Take 1 tablet (8 mg total) by mouth every 8 (eight) hours as needed for nausea or vomiting. (Patient not taking: Reported on 12/12/2023) 20 tablet 0   polyethylene glycol (MIRALAX ) packet Take 17 g by mouth daily. 28 each 0   No current facility-administered medications for this visit.    REVIEW OF SYSTEMS:   10 Point review of Systems was done is negative except as noted above.   PHYSICAL EXAMINATION: BP (!) 118/48   Pulse 94   Temp 97.9 F (36.6 C)   Resp 18   Wt 143 lb 4.8 oz (65 kg)   SpO2 100%   BMI 21.79 kg/m  Vitals:   06/25/24 1100  BP: (!) 118/48   NAD GENERAL:alert, in no acute distress  and comfortable SKIN: no acute rashes, no significant lesions EYES: conjunctiva are pink and non-injected, sclera anicteric OROPHARYNX: MMM, no exudates, no oropharyngeal erythema or ulceration NECK: supple, no JVD LYMPH:  no palpable lymphadenopathy in the cervical, axillary or inguinal regions LUNGS: clear to auscultation b/l with normal respiratory effort HEART: regular rate & rhythm ABDOMEN:  normoactive bowel sounds , non tender, not distended. Extremity: no pedal edema PSYCH: alert & oriented x 3 with fluent speech NEURO: no focal motor/sensory deficits   LABORATORY DATA:  I have reviewed the data as listed    Latest Ref Rng & Units 06/25/2024   10:35 AM 05/28/2024    1:56 PM 04/24/2024    8:26 AM  CBC EXTENDED  WBC 4.0 - 10.5 K/uL 11.5  11.4  17.3   RBC 4.22 - 5.81 MIL/uL 4.20  4.18  3.96   Hemoglobin 13.0 - 17.0 g/dL 87.8  87.8  88.5   HCT 39.0 - 52.0 % 37.1  36.6  36.4   Platelets 150 - 400 K/uL 107  123  124   NEUT# 1.7 - 7.7 K/uL 3.2  3.0  3.4   Lymph# 0.7 - 4.0 K/uL 7.1  7.4  12.6     CBC    Component Value Date/Time   WBC 11.5 (H) 06/25/2024 1035   WBC 139.2 (HH) 11/15/2021 1345   RBC 4.20 (L) 06/25/2024 1035   HGB 12.1 (L) 06/25/2024 1035   HGB 12.8 (L) 05/29/2017 1141   HCT 37.1 (L) 06/25/2024 1035   HCT 38.7 05/29/2017 1141   PLT 107 (L) 06/25/2024 1035   PLT 151 05/29/2017 1141   MCV 88.3 06/25/2024 1035   MCV 91.3 05/29/2017 1141   MCH 28.8 06/25/2024 1035   MCHC 32.6 06/25/2024 1035   RDW 15.5 06/25/2024 1035   RDW 13.8 05/29/2017 1141   LYMPHSABS 7.1 (H) 06/25/2024 1035   LYMPHSABS 8.8 (H) 05/29/2017 1141   MONOABS 1.0 06/25/2024 1035   MONOABS 0.7 05/29/2017 1141   EOSABS 0.0 06/25/2024 1035   EOSABS 0.1 05/29/2017 1141   BASOSABS 0.1 06/25/2024 1035   BASOSABS  0.0 05/29/2017 1141        Latest Ref Rng & Units 06/25/2024   10:35 AM 05/28/2024    1:56 PM 04/24/2024    8:26 AM  CMP  Glucose 70 - 99 mg/dL 889  898  882   BUN 8 - 23 mg/dL  17  21  22    Creatinine 0.61 - 1.24 mg/dL 8.96  8.86  8.86   Sodium 135 - 145 mmol/L 141  139  141   Potassium 3.5 - 5.1 mmol/L 5.3  4.3  4.9   Chloride 98 - 111 mmol/L 110  107  111   CO2 22 - 32 mmol/L 25  25  24    Calcium  8.9 - 10.3 mg/dL 9.8  8.8  9.0   Total Protein 6.5 - 8.1 g/dL 6.6  6.6  6.5   Total Bilirubin 0.0 - 1.2 mg/dL 0.8  0.7  0.7   Alkaline Phos 38 - 126 U/L 87  83  74   AST 15 - 41 U/L 19  19  18    ALT 0 - 44 U/L 16  16  15     06/08/17 FISH CLL Prognostic Panel:    06/08/17 Cytogenetics:   05/29/17 Peripheral blood flow cytometry:    RADIOGRAPHIC STUDIES: I have personally reviewed the radiological images as listed and agreed with the findings in the report.  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 High resolution imaging of the lungs, as well as inspiratory and expiratory imaging, was performed. RADIATION DOSE REDUCTION: This exam was performed according to the departmental dose-optimization program which includes automated exposure control, adjustment of the mA and/or kV according to patient size and/or use of iterative reconstruction technique. COMPARISON:  CT chest abdomen pelvis, 11/02/2022, 11/15/2019 FINDINGS: Cardiovascular: Aortic atherosclerosis. Normal heart size. Three-vessel coronary artery calcifications. No pericardial effusion. Mediastinum/Nodes: No enlarged mediastinal, hilar, or axillary lymph nodes. Thyroid  gland, trachea, and esophagus demonstrate no significant findings. Lungs/Pleura: Mild pulmonary fibrosis in a pattern with apical to basal gradient featuring irregular peripheral interstitial opacity and septal thickening with mild traction bronchiectasis and minimal subpleural  bronchiolectasis but without clear evidence of honeycombing. No significant air trapping on expiratory phase imaging. Mild underlying centrilobular emphysema. No pleural effusion or pneumothorax. Upper Abdomen: No acute abnormality. Musculoskeletal: No chest wall abnormality. No acute osseous findings. IMPRESSION: 1. 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 Am JINNY Honey Crit Care Med Vol 198, Iss 5, [phone], May 06 2017. 2. Mild underlying centrilobular emphysema. 3. No evidence of lymphadenopathy or metastatic disease in the chest. 4. Coronary artery disease. Aortic Atherosclerosis (ICD10-I70.0) and Emphysema (ICD10-J43.9). Electronically Signed   By: Marolyn JONETTA Jaksch M.D.   On: 06/03/2024 10:59   ECHOCARDIOGRAM COMPLETE Result Date: 05/03/2024    ECHOCARDIOGRAM REPORT   Patient Name:   Dayln D Reasoner Date of Exam: 05/02/2024 Medical Rec #:  [id number]  Height:       68.0 in Accession #:    [id number]   Weight:       143.5 lb Date of Birth:  [birth year]    BSA:          1.775 m Patient Age:    83 years     BP:           156/66 mmHg Patient Gender: M            HR:           79 bpm. Exam Location:  Inpatient Procedure: 2D Echo and 3D Echo (Both Spectral and Color Flow Doppler were            utilized during procedure). Indications:    dyspnea  History:        Patient has no prior history of Echocardiogram examinations.                 Risk Factors:Hypertension, Diabetes and  Dyslipidemia.  Sonographer:    Tinnie Gosling Referring Phys: [id number] Tony CANDIDA SARAN IMPRESSIONS  1. Left ventricular ejection fraction, by estimation, is 55 to 60%. The left ventricle has normal function. The left ventricle has no regional wall motion abnormalities. Left ventricular diastolic parameters were normal. The average left ventricular global longitudinal strain is -16.4 %.  2. Right ventricular systolic function is normal. The right ventricular size is normal.  3. The mitral valve is normal in structure. No evidence of mitral valve regurgitation. No evidence of mitral stenosis.  4. The aortic valve is normal in structure. There is mild calcification of the aortic valve. Aortic valve regurgitation is not visualized. No aortic stenosis is present. Aortic valve mean gradient measures 10.0 mmHg. Aortic valve Vmax measures 1.92 m/s.  5. The inferior vena cava is normal in size with greater than 50% respiratory variability, suggesting right atrial pressure of 3 mmHg. FINDINGS  Left Ventricle: Left ventricular ejection fraction, by estimation, is 55 to 60%. The left ventricle has normal function. The left ventricle has no regional wall motion abnormalities. The average left ventricular global longitudinal strain is -16.4 %. Strain was performed and the global longitudinal strain is indeterminate. The left ventricular internal cavity size was normal in size. There is no left ventricular hypertrophy. Left ventricular diastolic parameters were normal. Right Ventricle: The right ventricular size is normal. No increase in right ventricular wall thickness. Right ventricular systolic function is normal. Left Atrium: Left atrial size was normal in size. Right Atrium: Right atrial size was normal in size. Pericardium: There is no evidence of pericardial effusion. Mitral Valve: The mitral valve is normal in structure. No evidence of mitral valve regurgitation. No evidence of mitral valve stenosis. Tricuspid Valve: The  tricuspid valve is normal in structure. Tricuspid valve regurgitation is not demonstrated. No evidence of tricuspid stenosis. Aortic Valve: The aortic valve is normal in structure. There is mild calcification of the aortic valve. Aortic valve regurgitation is not visualized. No aortic stenosis is present. Aortic valve mean gradient measures 10.0 mmHg. Aortic valve peak gradient  measures 14.7 mmHg. Aortic valve area, by VTI measures 2.30 cm. Pulmonic Valve: The pulmonic valve was normal in structure. Pulmonic valve regurgitation is not visualized. No evidence of pulmonic stenosis. Aorta: The aortic root is normal in size and structure. Venous: The inferior vena cava is normal in size with greater than 50% respiratory variability, suggesting right atrial pressure of 3 mmHg. IAS/Shunts: No atrial level shunt detected by color flow Doppler.  LEFT VENTRICLE PLAX 2D LVIDd:         4.00 cm      Diastology LVIDs:         2.60 cm      LV e' medial:    5.66 cm/s LV PW:         1.20 cm      LV E/e' medial:  14.1 LV IVS:        1.20 cm      LV e' lateral:   9.14 cm/s LVOT diam:     2.30 cm      LV E/e' lateral: 8.7 LV SV:         95 LV SV Index:   54           2D Longitudinal Strain LVOT Area:     4.15 cm     2D Strain GLS Avg:     -16.4 %  LV Volumes (MOD) LV vol d, MOD A2C: 120.0 ml LV vol d, MOD A4C: 118.0 ml LV vol s, MOD A2C: 59.2 ml LV vol s, MOD A4C: 57.4 ml LV SV MOD A2C:     60.8 ml LV SV MOD A4C:     118.0 ml LV SV MOD BP:      66.8 ml RIGHT VENTRICLE             IVC RV S prime:     13.90 cm/s  IVC diam: 1.70 cm TAPSE (M-mode): 1.9 cm LEFT ATRIUM             Index        RIGHT ATRIUM           Index LA diam:        3.90 cm 2.20 cm/m   RA Area:     14.30 cm LA Vol (A2C):   37.8 ml 21.30 ml/m  RA Volume:   32.20 ml  18.14 ml/m LA Vol (A4C):   46.2 ml 26.03 ml/m LA Biplane Vol: 43.4 ml 24.45 ml/m  AORTIC VALVE AV Area (Vmax):    2.77 cm AV Area (Vmean):   2.15 cm AV Area (VTI):     2.30 cm AV Vmax:            192.00 cm/s AV Vmean:          146.000 cm/s AV VTI:            0.414 m AV Peak Grad:      14.7 mmHg AV Mean Grad:      10.0 mmHg LVOT Vmax:         128.00 cm/s LVOT Vmean:        75.600 cm/s LVOT VTI:          0.229 m LVOT/AV VTI ratio: 0.55  AORTA Ao Root diam: 3.10 cm Ao Asc diam:  3.50 cm MITRAL VALVE MV Area (PHT): 3.83 cm     SHUNTS MV Decel Time: 198 msec     Systemic VTI:  0.23 m MV E velocity: 79.60 cm/s   Systemic Diam: 2.30 cm MV A velocity: 113.00 cm/s MV E/A ratio:  0.70 Aditya Sabharwal Electronically signed by Ria Commander Signature Date/Time: 05/03/2024/12:16:54 PM    Final    DG Chest 2 View Result Date: 04/28/2024 CLINICAL DATA:  History of CLL.  Shortness of breath. EXAM: CHEST - 2 VIEW COMPARISON:  Chest radiograph 04/01/2021 FINDINGS: Stable cardiac and mediastinal contours. Bilateral nipple shadows. No large area pulmonary consolidation. No pleural effusion  or pneumothorax. IMPRESSION: No active cardiopulmonary disease. Electronically Signed   By: Bard Moats M.D.   On: 04/28/2024 21:20   ASSESSMENT & PLAN:   83 y.o. male with  1. Chronic Lymphocytic Leukemia Rai 1  06/08/17 CLL FISH Prognostic panel negative for del17p, del11q, del13q, indicating intermediate risk 11/15/2019 CT C/A/P ([id number]) revealed gradual lymph node increase compared to 2018 CT. No major lymphoma progression. Mildly enlarged prostate.    2.  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 -Continue to monitor and manage per dermatology  3.  History of hypertension  4.  Chronic lower extremity cramping and pain ?  Peripheral arterial disease versus pseudo claudications from lumbar spinal stenosis.  5.  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  6.  Dyspnea on exertion intermittently  PLAN: - Discussed lab results on 06/25/2024 in detail with patient: CBC showed WBC of 11.5K increased from 11.4K, Hemoglobin of 12.1 has not changed, and PLTs of 107K decreased from  123K.  Anemia has resolved. Low PLTs not concerning.  CMP stable.  Reviewed 06/03/2024 HRCT 1. 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 Am JINNY Honey Crit Care Med Vol 198, Iss 5, [phone], May 06 2017.  2. Mild underlying centrilobular emphysema.  3. No evidence of lymphadenopathy or metastatic disease in the chest.  4. Coronary artery disease. Aortic Atherosclerosis (ICD10-I70.0) and Emphysema (ICD10-J43.9).   - Extensively discussed Pulmonology referral and concern for Idiopathic Pulmonary Fibrosis, explaining how it can progress to requiring O2.  His SOB has reportedly improved since stopping Jardiance.  FOLLOW-UP: - RTC with Dr Onesimo with labs in 3 months - Referral to Pulmonary Dr Harden Staff or Dr Geronimo for UIP ? IPF  The total time spent in the appointment was 30 minutes*.  All of the patient's questions were answered with apparent satisfaction. The patient knows to call the clinic with any problems, questions or concerns.   Tony Onesimo MD MS AAHIVMS [facility] Hematology/Oncology Physician [facility]  .*Total Encounter Time as defined by the Centers for Medicare and Medicaid Services includes, in addition to the face-to-face time of a patient visit (documented in the note above) non-face-to-face time: obtaining and reviewing outside history, ordering and reviewing medications, tests or procedures, care coordination (communications with  other health care professionals or caregivers) and documentation in the medical record.  I,  Damien Lagle,acting as a scribe for Tony Onesimo, MD.,have documented all relevant documentation on the behalf of Tony Onesimo, MD,as directed by  Tony Onesimo, MD while in the presence of Tony Onesimo, MD.  I have reviewed the above documentation for accuracy and completeness, and I agree with the above. Tony Candida Onesimo MD.

## 2024-06-26 ENCOUNTER — Telehealth: Payer: Self-pay | Admitting: Hematology

## 2024-06-26 NOTE — Telephone Encounter (Signed)
 Scheduled patient for next appointments. Called and left a voicemail with the appointment details.

## 2024-07-03 ENCOUNTER — Other Ambulatory Visit: Payer: Self-pay | Admitting: Hematology

## 2024-07-03 ENCOUNTER — Other Ambulatory Visit (HOSPITAL_COMMUNITY): Payer: Self-pay

## 2024-07-03 ENCOUNTER — Other Ambulatory Visit: Payer: Self-pay

## 2024-07-03 DIAGNOSIS — C9110 Chronic lymphocytic leukemia of B-cell type not having achieved remission: Secondary | ICD-10-CM

## 2024-07-03 MED ORDER — CALQUENCE 100 MG PO TABS
100.0000 mg | ORAL_TABLET | Freq: Two times a day (BID) | ORAL | 1 refills | Status: AC
  Filled 2024-07-03 – 2024-07-04 (×2): qty 60, 30d supply, fill #0
  Filled 2024-07-31: qty 60, 30d supply, fill #1

## 2024-07-04 ENCOUNTER — Other Ambulatory Visit: Payer: Self-pay

## 2024-07-04 ENCOUNTER — Encounter (INDEPENDENT_AMBULATORY_CARE_PROVIDER_SITE_OTHER): Payer: Self-pay

## 2024-07-04 NOTE — Progress Notes (Signed)
 Specialty Pharmacy Refill Coordination Note  Tony Banks is a 83 y.o. male contacted today regarding refills of specialty medication(s) Acalabrutinib  Maleate (Calquence )   Patient requested Delivery   Delivery date: 07/09/24   Verified address: [address] [place]   Medication will be filled on: 07/08/24

## 2024-07-08 ENCOUNTER — Other Ambulatory Visit: Payer: Self-pay

## 2024-07-23 ENCOUNTER — Other Ambulatory Visit: Payer: Self-pay

## 2024-07-31 ENCOUNTER — Other Ambulatory Visit (HOSPITAL_COMMUNITY): Payer: Self-pay

## 2024-08-01 ENCOUNTER — Encounter (INDEPENDENT_AMBULATORY_CARE_PROVIDER_SITE_OTHER): Payer: Self-pay

## 2024-08-02 ENCOUNTER — Other Ambulatory Visit: Payer: Self-pay

## 2024-08-02 NOTE — Progress Notes (Signed)
 Specialty Pharmacy Refill Coordination Note  Tony Banks is a 83 y.o. male contacted today regarding refills of specialty medication(s) Acalabrutinib  Maleate (Calquence )   Patient requested Delivery   Delivery date: 08/05/24   Verified address: [address] [place] , [place]   Medication will be filled on: 08/02/24

## 2024-08-23 ENCOUNTER — Other Ambulatory Visit: Payer: Self-pay

## 2024-08-23 ENCOUNTER — Other Ambulatory Visit (HOSPITAL_COMMUNITY): Payer: Self-pay

## 2024-08-23 ENCOUNTER — Other Ambulatory Visit: Payer: Self-pay | Admitting: Hematology

## 2024-08-23 DIAGNOSIS — C9110 Chronic lymphocytic leukemia of B-cell type not having achieved remission: Secondary | ICD-10-CM

## 2024-08-23 MED ORDER — CALQUENCE 100 MG PO TABS
100.0000 mg | ORAL_TABLET | Freq: Two times a day (BID) | ORAL | 1 refills | Status: AC
  Filled 2024-08-23 – 2024-08-28 (×2): qty 60, 30d supply, fill #0
  Filled 2024-09-25 (×2): qty 60, 30d supply, fill #1

## 2024-08-28 ENCOUNTER — Encounter (INDEPENDENT_AMBULATORY_CARE_PROVIDER_SITE_OTHER): Payer: Self-pay

## 2024-08-28 ENCOUNTER — Other Ambulatory Visit (HOSPITAL_COMMUNITY): Payer: Self-pay

## 2024-08-28 ENCOUNTER — Other Ambulatory Visit: Payer: Self-pay

## 2024-08-28 ENCOUNTER — Other Ambulatory Visit: Payer: Self-pay | Admitting: Pharmacy Technician

## 2024-08-28 NOTE — Progress Notes (Signed)
 Specialty Pharmacy Refill Coordination Note  Tony Banks is a 83 y.o. male contacted today regarding refills of specialty medication(s) Acalabrutinib  Maleate (Calquence )   Patient requested Delivery   Delivery date: 09/03/24   Verified address: [address] [place] ,[place] [address]   Medication will be filled on: 09/02/24

## 2024-09-02 ENCOUNTER — Other Ambulatory Visit: Payer: Self-pay

## 2024-09-24 ENCOUNTER — Other Ambulatory Visit: Payer: Self-pay

## 2024-09-24 DIAGNOSIS — C9110 Chronic lymphocytic leukemia of B-cell type not having achieved remission: Secondary | ICD-10-CM

## 2024-09-25 ENCOUNTER — Ambulatory Visit (HOSPITAL_COMMUNITY)
Admission: RE | Admit: 2024-09-25 | Discharge: 2024-09-25 | Disposition: A | Discharge status: Home / Self Care | Source: Ambulatory Visit | Attending: Hematology | Admitting: Hematology

## 2024-09-25 ENCOUNTER — Other Ambulatory Visit: Payer: Self-pay | Admitting: Pharmacy Technician

## 2024-09-25 ENCOUNTER — Inpatient Hospital Stay: Attending: Hematology

## 2024-09-25 ENCOUNTER — Inpatient Hospital Stay (HOSPITAL_BASED_OUTPATIENT_CLINIC_OR_DEPARTMENT_OTHER): Admitting: Hematology

## 2024-09-25 ENCOUNTER — Other Ambulatory Visit (HOSPITAL_COMMUNITY): Payer: Self-pay

## 2024-09-25 ENCOUNTER — Other Ambulatory Visit: Payer: Self-pay

## 2024-09-25 VITALS — BP 110/61 | HR 103 | Temp 97.7°F | Resp 18 | Wt 132.3 lb

## 2024-09-25 DIAGNOSIS — R0602 Shortness of breath: Secondary | ICD-10-CM

## 2024-09-25 DIAGNOSIS — C9110 Chronic lymphocytic leukemia of B-cell type not having achieved remission: Secondary | ICD-10-CM

## 2024-09-25 LAB — CMP (CANCER CENTER ONLY)
ALT: 17 U/L (ref 0–44)
AST: 24 U/L (ref 15–41)
Albumin: 3.8 g/dL (ref 3.5–5.0)
Alkaline Phosphatase: 90 U/L (ref 38–126)
Anion gap: 14 (ref 5–15)
BUN: 19 mg/dL (ref 8–23)
CO2: 21 mmol/L — ABNORMAL LOW (ref 22–32)
Calcium: 8.9 mg/dL (ref 8.9–10.3)
Chloride: 104 mmol/L (ref 98–111)
Creatinine: 1.17 mg/dL (ref 0.61–1.24)
GFR, Estimated: >60 mL/min
Glucose, Bld: 106 mg/dL — ABNORMAL HIGH (ref 70–99)
Potassium: 4.8 mmol/L (ref 3.5–5.1)
Sodium: 139 mmol/L (ref 135–145)
Total Bilirubin: 0.7 mg/dL (ref 0.0–1.2)
Total Protein: 6.5 g/dL (ref 6.5–8.1)

## 2024-09-25 LAB — CBC WITH DIFFERENTIAL (CANCER CENTER ONLY)
Abs Immature Granulocytes: 0.02 K/uL (ref 0.00–0.07)
Basophils Absolute: 0 K/uL (ref 0.0–0.1)
Basophils Relative: 0 %
Eosinophils Absolute: 0 K/uL (ref 0.0–0.5)
Eosinophils Relative: 0 %
HCT: 34.4 % — ABNORMAL LOW (ref 39.0–52.0)
Hemoglobin: 11.5 g/dL — ABNORMAL LOW (ref 13.0–17.0)
Immature Granulocytes: 0 %
Lymphocytes Relative: 35 %
Lymphs Abs: 3.1 K/uL (ref 0.7–4.0)
MCH: 28.6 pg (ref 26.0–34.0)
MCHC: 33.4 g/dL (ref 30.0–36.0)
MCV: 85.6 fL (ref 80.0–100.0)
Monocytes Absolute: 1.1 K/uL — ABNORMAL HIGH (ref 0.1–1.0)
Monocytes Relative: 12 %
Neutro Abs: 4.7 K/uL (ref 1.7–7.7)
Neutrophils Relative %: 53 %
Platelet Count: 148 K/uL — ABNORMAL LOW (ref 150–400)
RBC: 4.02 MIL/uL — ABNORMAL LOW (ref 4.22–5.81)
RDW: 15 % (ref 11.5–15.5)
WBC Count: 8.9 K/uL (ref 4.0–10.5)
nRBC: 0 % (ref 0.0–0.2)

## 2024-09-25 MED ORDER — PREDNISONE 20 MG PO TABS
ORAL_TABLET | ORAL | 0 refills | Status: AC
  Filled 2024-09-25: qty 45, 30d supply, fill #0

## 2024-09-25 NOTE — Progress Notes (Signed)
 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 male who has been previously followed by my colleague Dr. 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 He is accompanied today by his wife. The pt reports that he is doing well overall. Verbal consent has been given by the pt for a clinical observer, Anushka Kaiwar, to be present.     The pt was diagnosed with CLL in October 2018, following a Peripheral blood flow cytometry and cytogenetics report. 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 The pt notes that his CLL was picked up off of routine labs and was not prompted by any associated symptoms.    The pt reports that he has not had any symptoms or concerns in the last 3 months he denies any constitutional symptoms or noticing any new lumps or bumps.     Most recent lab results (03/22/18) of CBC w/diff, CMP is as follows: all values are WNL except for WBC at 18.4k, Lymphs abs at 11.9k, Glucose at 128. LDH 03/22/18 is normal at 160 QIG 03/22/18 is pending   On review of systems, pt reports good energy levels, and denies fevers, chills, night sweats, unexpected weight loss, noticing any new lumps or bumps, abdominal pains, and any other symptoms.   INTERVAL HISTORY:  Tony Banks is a 84 y.o. male who is here today for  continued evaluation and management of CLL. he was last seen by me on 06/25/2024; at the time he mentioned experiencing improvement in symptoms of arthralgias/myalgias since stopping Jardiance, as well as with his breathing.   Today, he says that for the past two months he has been experiencing worsening shortness of breath and fatigue after about 5 minutes of exertion, prior to the previous 10 minutes. He says that he has no sense of taste, but is not exactly sure when this symptom onset as it gradually worsened - this in particular is upsetting for hm as he feels there is no point eating if he can not taste the food. Additionally, he is experiencing myalgias, cough (relieve with cough drops), constant chills, and difficulty swallowing large pills/some difficulty eating solid food, as well as swallowing down saliva. He notes that he does not use nutritional supplementation such as BOOST or Ensure, but does take a daily multivitamin.  He reports that he does note have an inhaler; he has been compliant with his Calquence  100 mg daily and is tolerating this without side effects or toxicities.   Denies any fevers, drenching night sweats, nausea/vomiting,  or bowel/urinary changes.   REVIEW OF SYSTEMS:   10 Point review of systems of done and is negative except as noted above.  MEDICAL HISTORY Past Medical History:  Diagnosis Date   Aortic atherosclerosis    CLL (chronic lymphocytic leukemia) (HCC)    Constipation    severe   Diabetes mellitus without complication (HCC)    Diverticulosis    HLD (hyperlipidemia)    Hypertension  Nephrolithiasis     IMMUNIZATION HISTORY Immunization History  Administered Date(s) Administered   Fluad Quad(high Dose 65+) 07/17/2019   INFLUENZA, HIGH DOSE SEASONAL PF 06/09/2017, 06/27/2018   Influenza-Unspecified 07/10/2015, 07/06/2016, 06/15/2020   PFIZER(Purple Top)SARS-COV-2 Vaccination 11/30/2019, 12/21/2019, 06/15/2020   Pneumococcal Conjugate-13  04/26/2016   Pneumococcal Polysaccharide-23 04/27/2014    SURGICAL HISTORY Past Surgical History:  Procedure Laterality Date   FINGER FRACTURE SURGERY Right    middle finger   SHOULDER ARTHROSCOPY Bilateral    VASECTOMY      SOCIAL HISTORY Social History[1]  Social History   Social History Narrative   Drinks 1-2 cups of coffee. Enjoys reading, watching tv and likes to ride his stationary bike.     SOCIAL DRIVERS OF HEALTH SDOH Screenings   Tobacco Use: Medium Risk (05/31/2024)     FAMILY HISTORY Family History  Problem Relation Age of Onset   Heart disease Mother    Stroke Mother    Cancer Father    Stroke Maternal Grandfather    Colon cancer Neg Hx    Pancreatic cancer Neg Hx    Rectal cancer Neg Hx    Stomach cancer Neg Hx      ALLERGIES: has no known allergies.  MEDICATIONS  Current Outpatient Medications  Medication Sig Dispense Refill   acalabrutinib  maleate (CALQUENCE ) 100 MG tablet Take 1 tablet (100 mg total) by mouth 2 (two) times daily. 60 tablet 1   ACCU-CHEK AVIVA PLUS test strip USE DAILY OR UP TO 4 TIMES PER DAY AS DIRECTED). USE AS INSTRUCTED 200 strip PRN   ACCU-CHEK SOFTCLIX LANCETS lancets Use as instructed 100 each 12   AMBULATORY NON FORMULARY MEDICATION Accu-chek aviva plus test strips and accu-chek softclix lancets, use to check blood sugar up to four times a day. Dx Type 2 diabetes. 100 Units 3   atorvastatin  (LIPITOR) 10 MG tablet Take 1 tablet (10 mg total) by mouth daily. 90 tablet 1   enalapril  (VASOTEC ) 5 MG tablet Take 1 tablet (5 mg total) by mouth daily. (Patient not taking: Reported on 06/25/2024) 90 tablet 1   Ferrous Sulfate (SLOW FE) 142 (45 Fe) MG TBCR Take 1 tablet by mouth daily. 30 tablet    magnesium  30 MG tablet Take 30 mg by mouth 2 (two) times daily. (Patient not taking: Reported on 06/25/2024)     metFORMIN  (GLUCOPHAGE ) 500 MG tablet TAKE 2 TABLETS BY MOUTH EVERY DAY WITH BREAKFAST (Patient taking differently: Take  1,000 mg by mouth daily with breakfast.) 180 tablet 3   polyethylene glycol (MIRALAX ) packet Take 17 g by mouth daily. (Patient not taking: Reported on 06/25/2024) 28 each 0   No current facility-administered medications for this visit.    PHYSICAL EXAMINATION: ECOG PERFORMANCE STATUS: 2 - Symptomatic, <50% confined to bed VITALS: Vitals:   09/25/24 1258  BP: 110/61  Pulse: (!) 103  Resp: 18  Temp: 97.7 F (36.5 C)  SpO2: 98%   Wt Readings from Last 3 Encounters:  09/25/24 132 lb 4.8 oz (60 kg)  06/25/24 143 lb 4.8 oz (65 kg)  05/28/24 140 lb 11.2 oz (63.8 kg)   Body mass index is 20.12 kg/m.  GENERAL: alert, in no acute distress and comfortable SKIN: no acute rashes, no significant lesions EYES: conjunctiva are pink and non-injected, sclera anicteric OROPHARYNX: MMM, no exudates, no oropharyngeal erythema or ulceration NECK: supple, no JVD LYMPH:  no palpable lymphadenopathy in the cervical, axillary or inguinal regions LUNGS: normal respiratory effort b/l, (+) fine inspiratory rales  HEART: regular rate & rhythm ABDOMEN:  normoactive bowel sounds , non tender, not distended, no hepatosplenomegaly Extremity: no pedal edema PSYCH: alert & oriented x 3 with fluent speech NEURO: no focal motor/sensory deficits  LABORATORY DATA:   I have reviewed the data as listed     Latest Ref Rng & Units 09/25/2024   12:21 PM 06/25/2024   10:35 AM 05/28/2024    1:56 PM  CBC EXTENDED  WBC 4.0 - 10.5 K/uL 8.9  11.5  11.4   RBC 4.22 - 5.81 MIL/uL 4.02  4.20  4.18   Hemoglobin 13.0 - 17.0 g/dL 88.4  87.8  87.8   HCT 39.0 - 52.0 % 34.4  37.1  36.6   Platelets 150 - 400 K/uL 148  107  123   NEUT# 1.7 - 7.7 K/uL 4.7  3.2  3.0   Lymph# 0.7 - 4.0 K/uL 3.1  7.1  7.4       Latest Ref Rng & Units 09/25/2024   12:21 PM 06/25/2024   10:35 AM 05/28/2024    1:56 PM  CMP  Glucose 70 - 99 mg/dL 893  889  898   BUN 8 - 23 mg/dL 19  17  21    Creatinine 0.61 - 1.24 mg/dL 8.82  8.96  8.86    Sodium 135 - 145 mmol/L 139  141  139   Potassium 3.5 - 5.1 mmol/L 4.8  5.3  4.3   Chloride 98 - 111 mmol/L 104  110  107   CO2 22 - 32 mmol/L 21  25  25    Calcium  8.9 - 10.3 mg/dL 8.9  9.8  8.8   Total Protein 6.5 - 8.1 g/dL 6.5  6.6  6.6   Total Bilirubin 0.0 - 1.2 mg/dL 0.7  0.8  0.7   Alkaline Phos 38 - 126 U/L 90  87  83   AST 15 - 41 U/L 24  19  19    ALT 0 - 44 U/L 17  16  16     PREVIOUS STUDIES:  06/08/17 FISH CLL Prognostic Panel:   06/08/17 Cytogenetics:   05/29/17 Peripheral blood flow cytometry:    RADIOGRAPHIC STUDIES: I have personally reviewed the radiological images as listed and agreed with the findings in the report. No results found.  ASSESSMENT & PLAN:  84 y.o. male with  1. Chronic Lymphocytic Leukemia Rai 1  06/08/17 CLL FISH Prognostic panel negative for del17p, del11q, del13q, indicating intermediate risk 11/15/2019 CT C/A/P ([id number]) revealed gradual lymph node increase compared to 2018 CT. No major lymphoma progression. Mildly enlarged prostate.     2.  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 -Continue to monitor and manage per dermatology   3.  History of hypertension Vitals:   09/25/24 1258  BP: 110/61   4.  Chronic lower extremity cramping and pain ?  Peripheral arterial disease versus pseudo claudications from lumbar spinal stenosis.   5.  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   6.  Dyspnea on exertion intermittently - Worsened over the past two months, onset after 5 minutes of exertion.  PLAN: - Discussed lab results on 09/25/2024 in detail with patient: CBC showed WBC of 8.9K decreased from 11.5K, Hemoglobin of 11.5 decreased from 12.1, and PLTs of 148K increased from 107K. CMP stable.  - Compliant with Calquence  100 mg daily and is tolerating this without side effects or toxicities.  Will hold Calquence  for while patient is experiencing persistent respiratory issues especially since he has  had  good CLL treatment response.  - He is experiencing significant respiratory issues. I would very much like for him to be evaluated by Pulmonology for UIP vs IPF - referral placed.   Ordering CXR to evaluate pulmonary status.  In the interim, I will send in:  Meds ordered this encounter  Medications   predniSONE  (DELTASONE ) 20 MG tablet    Sig: Take 3 tablets (60 mg total) by mouth daily with breakfast for 5 days, THEN 2 tablets (40 mg total) daily with breakfast for 5 days, THEN 1 tablet (20 mg total) daily with breakfast for 20 days.    Dispense:  45 tablet    Refill:  0   FOLLOW-UP - CXR today - Referral to Pulmonary Dr Harden Staff or Dr Geronimo for  UIP ? IPF - RTC with Dr Onesimo with labs in 4 weeks  The total time spent in the appointment was 30 minutes* .  All of the patient's questions were answered and the patient knows to call the clinic with any problems, questions, or concerns.  Emaline Onesimo MD MS AAHIVMS [facility] Hematology/Oncology Physician [facility]  *Total Encounter Time as defined by the Centers for Medicare and Medicaid Services includes, in addition to the face-to-face time of a patient visit (documented in the note above) non-face-to-face time: obtaining and reviewing outside history, ordering and reviewing medications, tests or procedures, care coordination (communications with other health care professionals or caregivers) and documentation in the medical record.  I,Emily Lagle,acting as a neurosurgeon for Emaline Onesimo, MD.,have documented all relevant documentation on the behalf of Emaline Onesimo, MD,as directed by  Emaline Onesimo, MD while in the presence of Emaline Onesimo, MD.  I have reviewed the above documentation for accuracy and completeness, and I agree with the above.  Emaline Onesimo, MD    ADDENDUM  CXR 1/21-1. No acute cardiopulmonary process. 2. Chronic interstitial lung disease.    [1]  Social History Tobacco Use   Smoking status: Former     Current packs/day: 0.00    Types: Cigarettes    Quit date: 07/31/1977    Years since quitting: 47.1   Smokeless tobacco: Never   Tobacco comments:    quit 30 yrs ago  Vaping Use   Vaping status: Never Used  Substance Use Topics   Alcohol use: No   Drug use: No   "

## 2024-09-27 ENCOUNTER — Other Ambulatory Visit (HOSPITAL_COMMUNITY): Payer: Self-pay

## 2024-10-01 ENCOUNTER — Other Ambulatory Visit: Payer: Self-pay

## 2024-10-29 ENCOUNTER — Ambulatory Visit: Admitting: Pulmonary Disease

## 2024-10-30 ENCOUNTER — Inpatient Hospital Stay: Admitting: Hematology

## 2024-10-30 ENCOUNTER — Inpatient Hospital Stay: Attending: Hematology
